# Patient Record
Sex: Female | Born: 1955 | Race: Black or African American | Hispanic: No | Marital: Single | Smoking: Former smoker
Health system: Southern US, Community
[De-identification: ages and names within clinical notes are randomized; demographics above are authoritative.]

## PROBLEM LIST (undated history)

## (undated) DIAGNOSIS — I10 Essential (primary) hypertension: Secondary | ICD-10-CM

## (undated) DIAGNOSIS — C801 Malignant (primary) neoplasm, unspecified: Secondary | ICD-10-CM

## (undated) DIAGNOSIS — J189 Pneumonia, unspecified organism: Secondary | ICD-10-CM

## (undated) DIAGNOSIS — M359 Systemic involvement of connective tissue, unspecified: Secondary | ICD-10-CM

## (undated) DIAGNOSIS — M069 Rheumatoid arthritis, unspecified: Secondary | ICD-10-CM

## (undated) DIAGNOSIS — E119 Type 2 diabetes mellitus without complications: Secondary | ICD-10-CM

## (undated) DIAGNOSIS — E785 Hyperlipidemia, unspecified: Secondary | ICD-10-CM

## (undated) DIAGNOSIS — R0600 Dyspnea, unspecified: Secondary | ICD-10-CM

## (undated) DIAGNOSIS — M869 Osteomyelitis, unspecified: Secondary | ICD-10-CM

## (undated) HISTORY — PX: TOE SURGERY: SHX1073

## (undated) HISTORY — DX: Rheumatoid arthritis, unspecified: M06.9

---

## 2014-02-08 ENCOUNTER — Inpatient Hospital Stay: Payer: Self-pay | Admitting: Specialist

## 2014-02-08 LAB — COMPREHENSIVE METABOLIC PANEL
ALBUMIN: 2.9 g/dL — AB (ref 3.4–5.0)
ALK PHOS: 113 U/L
ALT: 12 U/L (ref 12–78)
ANION GAP: 7 (ref 7–16)
BUN: 12 mg/dL (ref 7–18)
Bilirubin,Total: 0.3 mg/dL (ref 0.2–1.0)
Calcium, Total: 8.9 mg/dL (ref 8.5–10.1)
Chloride: 101 mmol/L (ref 98–107)
Co2: 26 mmol/L (ref 21–32)
Creatinine: 0.69 mg/dL (ref 0.60–1.30)
EGFR (Non-African Amer.): >60
GLUCOSE: 456 mg/dL — AB (ref 65–99)
Osmolality: 288 (ref 275–301)
Potassium: 4.2 mmol/L (ref 3.5–5.1)
SGOT(AST): 16 U/L (ref 15–37)
Sodium: 134 mmol/L — ABNORMAL LOW (ref 136–145)
TOTAL PROTEIN: 7.6 g/dL (ref 6.4–8.2)

## 2014-02-08 LAB — CBC
HCT: 40.4 % (ref 35.0–47.0)
HGB: 13.3 g/dL (ref 12.0–16.0)
MCH: 30 pg (ref 26.0–34.0)
MCHC: 32.9 g/dL (ref 32.0–36.0)
MCV: 91 fL (ref 80–100)
Platelet: 380 10*3/uL (ref 150–440)
RBC: 4.43 10*6/uL (ref 3.80–5.20)
RDW: 13.3 % (ref 11.5–14.5)
WBC: 12.2 10*3/uL — AB (ref 3.6–11.0)

## 2014-02-08 LAB — HEMOGLOBIN A1C: HEMOGLOBIN A1C: 14.7 % — AB (ref 4.2–6.3)

## 2014-02-09 LAB — CBC WITH DIFFERENTIAL/PLATELET
BASOS ABS: 0.1 10*3/uL (ref 0.0–0.1)
BASOS PCT: 0.7 %
Eosinophil #: 0.2 10*3/uL (ref 0.0–0.7)
Eosinophil %: 1.1 %
HCT: 35.3 % (ref 35.0–47.0)
HGB: 11.7 g/dL — AB (ref 12.0–16.0)
Lymphocyte #: 3.4 10*3/uL (ref 1.0–3.6)
Lymphocyte %: 22 %
MCH: 29.9 pg (ref 26.0–34.0)
MCHC: 33 g/dL (ref 32.0–36.0)
MCV: 91 fL (ref 80–100)
MONO ABS: 1.5 x10 3/mm — AB (ref 0.2–0.9)
MONOS PCT: 9.7 %
NEUTROS ABS: 10.3 10*3/uL — AB (ref 1.4–6.5)
NEUTROS PCT: 66.5 %
Platelet: 354 10*3/uL (ref 150–440)
RBC: 3.89 10*6/uL (ref 3.80–5.20)
RDW: 13.4 % (ref 11.5–14.5)
WBC: 15.4 10*3/uL — ABNORMAL HIGH (ref 3.6–11.0)

## 2014-02-09 LAB — BASIC METABOLIC PANEL
Anion Gap: 5 — ABNORMAL LOW (ref 7–16)
BUN: 11 mg/dL (ref 7–18)
CHLORIDE: 102 mmol/L (ref 98–107)
Calcium, Total: 8.4 mg/dL — ABNORMAL LOW (ref 8.5–10.1)
Co2: 26 mmol/L (ref 21–32)
Creatinine: 0.78 mg/dL (ref 0.60–1.30)
EGFR (African American): >60
Glucose: 220 mg/dL — ABNORMAL HIGH (ref 65–99)
OSMOLALITY: 273 (ref 275–301)
POTASSIUM: 4.2 mmol/L (ref 3.5–5.1)
SODIUM: 133 mmol/L — AB (ref 136–145)

## 2014-02-10 LAB — VANCOMYCIN, TROUGH: VANCOMYCIN, TROUGH: 8 ug/mL — AB (ref 10–20)

## 2014-02-11 LAB — CBC WITH DIFFERENTIAL/PLATELET
BASOS PCT: 0.8 %
Basophil #: 0.1 10*3/uL (ref 0.0–0.1)
EOS ABS: 0.3 10*3/uL (ref 0.0–0.7)
EOS PCT: 2.3 %
HCT: 35.2 % (ref 35.0–47.0)
HGB: 11.4 g/dL — ABNORMAL LOW (ref 12.0–16.0)
Lymphocyte #: 3.8 10*3/uL — ABNORMAL HIGH (ref 1.0–3.6)
Lymphocyte %: 33.3 %
MCH: 29.2 pg (ref 26.0–34.0)
MCHC: 32.4 g/dL (ref 32.0–36.0)
MCV: 90 fL (ref 80–100)
MONO ABS: 1.1 x10 3/mm — AB (ref 0.2–0.9)
Monocyte %: 9.6 %
Neutrophil #: 6.1 10*3/uL (ref 1.4–6.5)
Neutrophil %: 54 %
PLATELETS: 409 10*3/uL (ref 150–440)
RBC: 3.91 10*6/uL (ref 3.80–5.20)
RDW: 13.3 % (ref 11.5–14.5)
WBC: 11.3 10*3/uL — ABNORMAL HIGH (ref 3.6–11.0)

## 2014-02-12 LAB — CBC WITH DIFFERENTIAL/PLATELET
BASOS ABS: 0.1 10*3/uL (ref 0.0–0.1)
Basophil %: 0.6 %
EOS PCT: 2.3 %
Eosinophil #: 0.2 10*3/uL (ref 0.0–0.7)
HCT: 33.7 % — ABNORMAL LOW (ref 35.0–47.0)
HGB: 11.2 g/dL — ABNORMAL LOW (ref 12.0–16.0)
LYMPHS ABS: 2.9 10*3/uL (ref 1.0–3.6)
LYMPHS PCT: 28.6 %
MCH: 30.2 pg (ref 26.0–34.0)
MCHC: 33.3 g/dL (ref 32.0–36.0)
MCV: 91 fL (ref 80–100)
MONOS PCT: 11.9 %
Monocyte #: 1.2 x10 3/mm — ABNORMAL HIGH (ref 0.2–0.9)
NEUTROS PCT: 56.6 %
Neutrophil #: 5.7 10*3/uL (ref 1.4–6.5)
PLATELETS: 404 10*3/uL (ref 150–440)
RBC: 3.71 10*6/uL — ABNORMAL LOW (ref 3.80–5.20)
RDW: 13.1 % (ref 11.5–14.5)
WBC: 10.1 10*3/uL (ref 3.6–11.0)

## 2014-02-13 LAB — WOUND CULTURE

## 2014-02-13 LAB — CULTURE, BLOOD (SINGLE)

## 2014-02-13 LAB — SEDIMENTATION RATE: Erythrocyte Sed Rate: 73 mm/hr — ABNORMAL HIGH (ref 0–30)

## 2014-02-14 LAB — WOUND CULTURE

## 2014-04-18 DIAGNOSIS — R809 Proteinuria, unspecified: Secondary | ICD-10-CM | POA: Insufficient documentation

## 2014-05-18 DIAGNOSIS — J309 Allergic rhinitis, unspecified: Secondary | ICD-10-CM | POA: Insufficient documentation

## 2014-12-15 NOTE — Consult Note (Signed)
PATIENT NAME:  Tamara Parrish, Tamara Parrish MR#:  [id number] DATE OF BIRTH:  12/10/1955  DATE OF CONSULTATION:  02/09/2014  REFERRING PHYSICIAN:   CONSULTING PHYSICIAN:  Gerrit Heck. Troxler, DPM  REASON FOR CONSULTATION: Infected left great toe.   HISTORY OF PRESENT ILLNESS: The patient is a 59 year old African American female who has neglected her diabetes for probably the last 10 or 11 years. She came in with a hemoglobin A1c of almost 15. She presented with swollen, draining, red, painful left great toe that she states has been going on since last week, but may have been going on for longer period of time. X-ray showed severe soft tissue swelling with possible osteomyelitis, but uncertain at this juncture.   PAST MEDICAL HISTORY: Includes uncontrolled diabetes and hypertension.  PAST SURGICAL HISTORY: Includes tubal ligation.   ALLERGIES: According to the patient, she is allergic to IBUPROFEN AND PENICILLIN, but she says she takes Tylenol and ibuprofen as needed.   SOCIAL HISTORY: Includes positive smoking. She states she stopped 2 months ago. Denies EtOH. She is a Education officer, environmental.   FAMILY HISTORY: Includes mother and sister with diabetes.   PHYSICAL EXAMINATION:  VITAL SIGNS: At this juncture include: Temperature is 98.9, pulse 81, respirations 18, blood pressure 127/67, and pulse ox is 98%.  LOWER EXTREMITY: Vasculature: DP pulses are +2/4 on this foot. There is some swelling and edema, but it is fairly minimal, but pulses are distinctly palpable. Dermatologic: The patient has an abscess, which is ruptured, on the dorsum of the left foot. Noted cellulitis on the left great toe as well. It is very painful for her with attempts at manipulation.   CLINICAL IMPRESSION: Uncontrolled diabetes with cellulitis and abscess to the left great toe, which x-rays indicate significant soft tissue involvement and possible bone involvement.   TREATMENT PLAN: I took a culture of it today. We will send that to the lab.  I have got her scheduled for incision and drainage tomorrow morning in the OR. She is going to need to be put to sleep because it is very painful for her. I explained to her today that we will have to have her sign a consent form for removal of infected soft tissue including possible tendon and possible removal of infected bone to the great toe as well. I do not plan on amputating the toe at this digit but ultimately she may need this based on the infection and degree of damage that she has to the toe. I will have her sign a consent form and have her be n.p.o. after midnight tonight. I explained everything to her in detail. Hopefully she understands that well. She indicated that she understood everything that I had to say and have her scheduled for tomorrow morning for this incision and drainage.    ____________________________ Gerrit Heck Troxler, DPM mgt:sb D: 02/09/2014 15:20:43 ET T: 02/09/2014 17:08:08 ET JOB#: [id number]  cc: Gerrit Heck Troxler, DPM, <Dictator> Perry Mount MD ELECTRONICALLY SIGNED 03/02/2014 8:32

## 2014-12-15 NOTE — Consult Note (Signed)
PATIENT NAME:  Tamara Parrish, Tamara Parrish MR#:  [id number] DATE OF BIRTH:  05-16-1956  DATE OF CONSULTATION:  02/12/2014  REFERRING PHYSICIAN:  Abel Presto, MD  CONSULTING PHYSICIAN:  Cheral Marker. Ola Spurr, MD  REASON FOR CONSULTATION: Diabetic foot osteomyelitis.   HISTORY OF PRESENT ILLNESS: This is a pleasant 59 year old female, former smoker, as well as poorly controlled diabetes who has been without a physician for many years. She was admitted June 18th with left foot great toe infection that had been worsening over several weeks. When she was admitted her sugars were unreadable and her A1c was greater than 15. She has not been on any medications for her diabetes in many years. She just quit smoking about a month ago. After admission the patient had an x-ray which showed soft tissue swelling. She was seen by Dr. Elvina Mattes and on June 20th underwent incision and debridement of the left great toe with findings of deep soft tissue infection and osteomyelitis of the left great toe. No amputation was done. Cultures were sent and are growing enterococcus. She has been on vancomycin and Zosyn since that time. She has been tolerating it well.   PAST MEDICAL HISTORY: 1.  Diabetes, very poorly controlled.  2.  Hypertension.  3.  Tobacco abuse, just quit 1 month ago.   PAST SURGICAL HISTORY: Tubal ligation.   ALLERGIES: The patient says she is allergic to IBUPROFEN AND PENICILLIN. She says she had some hives with the penicillin. She has been tolerating Zosyn, however.  HOME MEDICATIONS: Include Tylenol and ibuprofen.   MEDICATIONS SINCE ADMISSION: Vancomycin and Zosyn.   SOCIAL HISTORY: The patient works with disabled adults. She quit smoking after many years, 1 to 2 months ago. She does not drink alcohol. She is living with her daughter.   FAMILY HISTORY: Mother and sister with diabetes.   REVIEW OF SYSTEMS: Eleven systems reviewed and negative, except per HPI.   PHYSICAL EXAMINATION: VITAL SIGNS:  Temperature 98.2, pulse 65, blood pressure 145/77, respirations 18, sat 98% on room air.  GENERAL: She is pleasant, interactive, in no acute distress.  HEENT: Pupils equal, round and reactive to light and accommodation. Extraocular movements are intact. Sclerae anicteric. Oropharynx is clear.  NECK: Supple.  HEART: Regular.  LUNGS: Clear to auscultation bilaterally.  ABDOMEN: Soft, nontender, nondistended.  EXTREMITIES: Her left foot is unwrapped. She has an incision over her left great toe. This is packed. There is swelling and erythema surrounding it. NEUROLOGIC: She is alert and oriented x3. Grossly nonfocal neuro exam.  DIAGNOSTIC DATA: Her white blood count on admission was 12.2. It peaked at 15.4 and is now 10.1, hemoglobin 11.2, platelets 404,000. Hemoglobin A1c is 14.7. Renal function is normal. Creatinine 0.78. LFTs are normal, except for slightly low albumin at 2.9.   Blood cultures x2 are negative. Cultures of the left foot are growing light growth of Enterococcus faecalis. This was sensitive to ampicillin and linezolid. Culture from June 20th is also being held for possible pathogen.   Imaging of the toe shows severe soft tissue swelling and subcutaneous air around the toe and erosive changes of the distal phalanx of the great toe.   Chest x-ray shows no acute cardiopulmonary disease.   IMPRESSION: A 59 year old with very poorly controlled diabetes admitted with several weeks of ulceration on her left great toe followed by abscess formation and likely osteomyelitis. She has undergone debridement but not amputation of the toe. Her A1c is 14.7. 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   I am concerned that she will need high doses of antibiotics to cure this and prevent further need for amputation. This is for limb salvage. I discussed with her she will need much better diabetes control and  advised her to continue cessation of smoking.   RECOMMENDATIONS: 1.  Place PICC line.  2.  Zosyn 3.375 q. 8 hours for 4 weeks at least. I can see her in follow up regarding this. She is listed as being allergic to penicillin, but has been tolerating the Zosyn fine. We will need to closely monitor. She will need weekly CBC with diff and comprehensive metabolic panel as well as CRP.  3.  Check CRP and ESR for baseline.   Thank you for the consult. I will be glad to follow with you.   ____________________________ Cheral Marker. Ola Spurr, MD dpf:sb D: 02/12/2014 15:47:34 ET T: 02/12/2014 16:25:22 ET JOB#: [id number]  cc: Cheral Marker. Ola Spurr, MD, <Dictator> DAVID Ola Spurr MD ELECTRONICALLY SIGNED 02/15/2014 13:28

## 2014-12-15 NOTE — Op Note (Signed)
PATIENT NAME:  Tamara Parrish, Tamara Parrish MR#:  [id number] DATE OF BIRTH:  Sep 24, 1955  DATE OF PROCEDURE:  02/10/2014  PREOPERATIVE DIAGNOSIS: Diabetic foot infection, left great toe, with deep soft tissue infection and possible osteomyelitis.   POSTOPERATIVE DIAGNOSIS: Deep tissue infection with osteomyelitis, left great toe.   PROCEDURE: Excision of infected soft tissue and bone, left great toe.   SURGEON: Gerrit Heck. Troxler, DPM  ASSISTANT: None.   HISTORY OF PRESENT ILLNESS: The patient was admitted to the hospital a couple days ago with redness, cellulitis, open draining wound on the left great toe which was very pronouncedly infected. She is diabetic. Incision and drainage was necessary. On x-ray, there was some indication that there may be some change in the distal phalanx, but that was uncertain based on the radiology report.   ANESTHESIA: General.   ANESTHESIOLOGIST: Alvina Filbert. Andree Elk, MD   ESTIMATED BLOOD LOSS: Negligible.   HEMOSTASIS: A Penrose drain tourniquet at the base of the toe for 5 minutes.   DESCRIPTION OF PROCEDURE: The patient is brought to the OR and placed on the OR table in the supine position. After general anesthesia was achieved, the patient was prepped and draped in the usual sterile manner. At this time, attention was directed to the dorsum of the left great toe, where a wound approximately 2 cm in diameter was noted. Probing of the area showed that it penetrated down to at least the extensor tendon area, possibly down into the bone region. A combination of rongeurs as well as VersaJet was utilized to remove infected damaged soft tissue. It was noted at this point that the extensor tendon was eroded and infected at the distal phalanx insertion site. Bone had demineralized and was in the wound site at this point. Some of this bone was removed for culture. At this point, a combination of curette and rongeur was used to remove all abnormal-appearing portions of the distal  phalanx. The patient did not consent for an amputation, so I felt like we needed to just clean out the bone as best we could, remove all infected soft tissues, which was accomplished. The area was then copiously irrigated, and skin was then closed with 4-0 and 3-0 nylon simple interrupted sutures. The extensor tendon was purchased with several of the sutures in order to stabilize it so that it would tenodese down to the remainder of the bone. The skin was closed distally as well. The wound had been opened up proximally and distally by 0.5 cm on each side. These were all closed with 4-0 nylon. The wound was then packed open with iodoform gauze packing. A sterile dressing was then placed across the wound. The tourniquet was released. Prompt and complete vascularity was seen to return to the digit. A sterile dressing was placed across the wound consisting of 4 x 4's, Conform and Kerlix. The patient appeared to tolerate the procedure and anesthesia well and left the OR for the recovery room with vital signs stable and neurovascular status intact.   ____________________________ Gerrit Heck. Troxler, DPM mgt:lb D: 02/10/2014 08:47:34 ET T: 02/10/2014 10:11:52 ET JOB#: [id number]  cc: Gerrit Heck Troxler, DPM, <Dictator> Perry Mount MD ELECTRONICALLY SIGNED 03/02/2014 8:32

## 2014-12-15 NOTE — H&P (Signed)
PATIENT NAME:  Tamara Parrish, Tamara Parrish MR#:  [id number] DATE OF BIRTH:  February 26, 1956  DATE OF ADMISSION:  02/08/2014  PRIMARY CARE PHYSICIAN:  None.  Has seen Dr. Ronnald Collum a long time ago, 10 to 15 years ago.   REQUESTING PHYSICIAN: Dr. Lavonia Drafts.  CHIEF COMPLAINT: Left great toe pain.   HISTORY OF PRESENT ILLNESS: The patient is a 59 year old female with a known history of diabetes who is being admitted for left great toe diabetic infection. The patient went to urgent care this morning for painful left great toe, which has been there for the last few weeks; it has been getting worse. She was also told her blood sugar has been unreadable at urgent care and she was requested to come to the Emergency Department. The patient at some point was on metformin and Lantus. She took it for about 4 years, has not been taking this for at least the last 10, 11 years due to lack of insurance. She has been taking IBUPROFEN and Tylenol on and off for her toe pain. Ibuprofen is bothering her stomach and now she is here for further evaluation and management.  While in the ED, she underwent x-ray which showed severe soft tissue swelling and subcutaneous air, consistent with soft tissue infection, possible osteomyelitis cannot be ruled out. She is being admitted for further evaluation and management.  PAST MEDICAL HISTORY:  Diabetes, hypertension.   PAST SURGICAL HISTORY:  Tubal ligation.   ALLERGIES: IBUPROFEN AND PENICILLIN.   MEDICATIONS AT HOME: 1.  Tylenol as needed. 2.  IBUPROFEN as needed.   SOCIAL HISTORY: Former smoker 80 pack-years, stopped 2 months ago. No alcohol.  She is a Education officer, environmental.   FAMILY HISTORY: Mother and sister with diabetes.   REVIEW OF SYSTEMS: CONSTITUTIONAL: No fever, fatigue, weakness.   EYES:  No blurred or double vision.  ENT: No tinnitus, ear pain.  RESPIRATORY: No cough, wheezing, hemoptysis.  CARDIOVASCULAR: No chest pain, orthopnea, edema.  GASTROINTESTINAL: No nausea,  vomiting, diarrhea.  GENITOURINARY: No dysuria or hematuria.  ENDOCRINE: Polyuria, nocturia.  HEMATOLOGIC:  No anemia, easy bruising. SKIN: She has a left great toe abscess with possible underlying osteomyelitis, also very painful. She is not able to move due to significant pain in the area.  MUSCULOSKELETAL: Left great toe pain. No arthritis. No swelling.  NEUROLOGIC: No tingling, numbness, weakness.  PSYCHIATRIC: No history of anxiety or depression.   PHYSICAL EXAMINATION: VITAL SIGNS: Temperature 98.3, heart rate 103 per minute, respirations 18 per minute, blood pressure 210/114 mmHg.  She is saturating 98% on room air.  GENERAL: The patient is a 59 year old female lying in the bed in no acute distress.  EYES: Pupils equal, round, reactive to light and accommodation. No scleral icterus. Extraocular muscles intact.  HENT: Head atraumatic, normocephalic. Oropharynx and nasopharynx clear.  NECK: Supple. No jugular venous distention. Show no thyroid enlargement or tenderness.  LUNGS: Clear to auscultation bilaterally. No wheezing, rales, rhonchi, crepitation.  CARDIOVASCULAR: S1, S2 normal. No murmur, rubs, gallop.  ABDOMEN: Soft, nontender, nondistended. Bowel sounds present. No organomegaly or mass.  EXTREMITIES: No cyanosis or clubbing. She does have left great toe abscess with significant tenderness with any range of motion.  She has significant pus collection right above her great toe. NEUROLOGIC: Cranial nerves II through XII intact. Muscle strength 5 out of 5 in extremities.   Sensation intact.  PSYCHIATRIC:  The patient is alert and oriented x 3. SKIN:  Finding as above in her left great toe.  MUSCULOSKELETAL: No joint effusion or tenderness other than left great toe area.   Chest x-ray in the ED showed no acute cardiopulmonary disease.  Left foot/great toe x-ray showed severe soft tissue swelling and subcutaneous air, consistent with soft tissue infection. Erosive change at the base  of the distal phalanx of the left great toe suggestive of osteomyelitis with possible associated septic arthritis.   IMPRESSION AND PLAN: 1.  Left great toe osteomyelitis and/or septic arthritis.  We will start on IV vancomycin and Zosyn. Consult podiatry. Will likely need surgery. Obtain wound culture and blood culture. 2.  Uncontrolled diabetes.  We will check hemoglobin A1c.  She has not been taking any medication due to lack of insurance.  We will get a diabetic educator consult and consult care management will help with insurance and medication help.  Start on sliding-scale insulin, metformin and also Levemir. 3.  Uncontrolled hypertension. We will start her on metoprolol at this time and adjust medication as needed.  We will also start on ACE inhibitor considering her diabetes.  CODE STATUS:  FULL CODE.  TOTAL TIME TAKING CARE OF THIS PATIENT:  55 minutes.   ____________________________ Lucina Mellow. Manuella Ghazi, MD vss:ce D: 02/08/2014 15:37:21 ET T: 02/08/2014 16:34:08 ET JOB#: [id number]  cc: Jamilette Suchocki S. Manuella Ghazi, MD, <Dictator> Unknown Remer Macho MD ELECTRONICALLY SIGNED 02/09/2014 8:57

## 2014-12-15 NOTE — Discharge Summary (Signed)
PATIENT NAME:  Tamara Parrish, Tamara Parrish MR#:  [id number] DATE OF BIRTH:  Jan 19, 1956  DATE OF ADMISSION:  02/08/2014 DATE OF DISCHARGE:  02/13/2014  Patient name, Tamara Parrish service MR # colon [id number].   DATE OF BIRTH: 02/01/1962.   For a detailed note, please take a look at the history and physical done on admission by Dr. Manuella Ghazi.   DIAGNOSES AT DISCHARGE: As follows:  1. Acute left foot/great toe osteomyelitis status post debridement.  2. Hypertension.  3. Uncontrolled diabetes due to medical noncompliance.   DISCHARGE DIET:  The patient is being discharged on a low-sodium, low-fat American Diabetic Association diet.   ACTIVITY: As tolerated.   FOLLOWUP:  The followup is with Dr. Ola Spurr in the next 1-2 weeks. Also follow up with Dr. Elvina Mattes in 1-2 weeks. Also follow up with Dr. Johny Drilling at [facility] or someone in the group in the next 1-2 weeks.   DISCHARGE MEDICATIONS:  Tylenol with oxycodone 5/325 one tablet q. 4 hours as needed; lisinopril 5 mg daily; metformin 1000 mg b.i.d.; metoprolol tartrate 25 mg b.i.d.; Zosyn 3.375 IV q. 8 hours x 4 weeks, last dose being 03/08/2014; Levemir FlexPen 10 units at bedtime.   The patient is being discharged on an iodoform pack dressing change to be done daily, along with a dry gauze.   PERTINENT STUDIES DONE DURING THE HOSPITAL COURSE: Are as follows: An x-ray of the left great toe done on admission showing severe soft tissue swelling and subcutaneous air noted about the left great toe consistent with soft tissue infection. Erosive changes at the base of the distal phalanx on the left great toe.  This suggests osteomyelitis with possible associated septic arthritis. Chest x-ray done on admission showing no acute cardiopulmonary disease. The patient's wound cultures to be positive for Enterococcus faecalis.   [facility] COURSE: Dr. Adrian Prows, infectious disease; Dr. Albertine Patricia, podiatry.   HOSPITAL COURSE: This  is a 59 year old female who presented to the hospital with a swollen left great toe, which was tender and red.  1. Left great toe osteomyelitis. This was likely the cause of patient's left great toe swelling and redness that she presented with. The patient apparently has diabetes and was noncompliant and was not taking any medications. The patient was seen by podiatry who recommended surgical intervention. Therefore, she was taken to the OR, had surgical debridement of the wound and also removal of the infected bone. Wound cultures are presently growing Enterococcus faecalis. Empirically, patient was on vancomycin, Zosyn. Now currently is being discharged on just IV Zosyn given the fact that patient likely has osteomyelitis. The patient was also seen by infectious disease who agreed with long-term IV antibiotic therapy. Therefore, she had a PICC line placed and is currently being discharged on IV antibiotics. She is currently afebrile and hemodynamically stable. Her white cell count is normal. She will follow up with Dr. Ola Spurr as an outpatient and also she is going to be arranged for a new primary care physician with [facility] as mentioned.  2. Uncontrolled diabetes. This is secondary to medical noncompliance. The patient's hemoglobin A1c is high as 14. She was not taking any medications prior to coming in. The patient was seen by diabetic lifestyle and was given education about the disease. The patient was started on sliding scale insulin, along with metformin and Levemir. At present, I am discharging her on the high-dose metformin along with Levemir. Further titrations to her antidiabetic medications can be done  through her primary care physician's office once she gets established.  3. Hypertension. The patient was started on some low-dose metoprolol and ACE inhibitor, and her hemodynamics have since then been stable.   CODE STATUS: The patient is a full code.   DISPOSITION: She is being  discharged with home health nursing services given her chronic wound care and long-term IV antibiotics.   TIME SPENT ON DISCHARGE:  45 minutes.    ____________________________ Belia Heman. Verdell Carmine, MD vjs:dd D: 02/13/2014 16:03:36 ET T: 02/13/2014 20:04:32 ET JOB#: [id number]  cc: Belia Heman. Verdell Carmine, MD, <Dictator> Cheral Marker. Ola Spurr, MD Gerrit Heck Troxler, DPM [place]era [place], MD Henreitta Leber MD ELECTRONICALLY SIGNED 02/20/2014 20:35

## 2014-12-15 NOTE — Consult Note (Signed)
Brief Consult Note: Diagnosis: cellulitis with abcess left great toe.   Patient was seen by consultant.   Recommend to proceed with surgery or procedure.   Orders entered.   Comments: Will need extensive debridement tomorrow.  May have toe.  Electronic Signatures: Perry Mount (MD)  (Signed 19-Jun-15 15:22)  Authored: Brief Consult Note   Last Updated: 19-Jun-15 15:22 by Perry Mount (MD)

## 2015-05-03 DIAGNOSIS — E119 Type 2 diabetes mellitus without complications: Secondary | ICD-10-CM | POA: Insufficient documentation

## 2016-03-18 DIAGNOSIS — M2550 Pain in unspecified joint: Secondary | ICD-10-CM | POA: Insufficient documentation

## 2016-03-27 DIAGNOSIS — M059 Rheumatoid arthritis with rheumatoid factor, unspecified: Secondary | ICD-10-CM | POA: Insufficient documentation

## 2016-10-09 DIAGNOSIS — E7800 Pure hypercholesterolemia, unspecified: Secondary | ICD-10-CM | POA: Insufficient documentation

## 2016-10-09 DIAGNOSIS — I10 Essential (primary) hypertension: Secondary | ICD-10-CM | POA: Insufficient documentation

## 2017-05-17 DIAGNOSIS — J301 Allergic rhinitis due to pollen: Secondary | ICD-10-CM | POA: Insufficient documentation

## 2017-05-17 DIAGNOSIS — G63 Polyneuropathy in diseases classified elsewhere: Secondary | ICD-10-CM | POA: Insufficient documentation

## 2017-08-19 DIAGNOSIS — R058 Other specified cough: Secondary | ICD-10-CM | POA: Insufficient documentation

## 2017-08-19 DIAGNOSIS — R05 Cough: Secondary | ICD-10-CM | POA: Insufficient documentation

## 2017-08-19 DIAGNOSIS — M0639 Rheumatoid nodule, multiple sites: Secondary | ICD-10-CM | POA: Insufficient documentation

## 2017-12-16 DIAGNOSIS — R700 Elevated erythrocyte sedimentation rate: Secondary | ICD-10-CM | POA: Insufficient documentation

## 2018-02-07 ENCOUNTER — Emergency Department: Payer: BLUE CROSS/BLUE SHIELD

## 2018-02-07 ENCOUNTER — Encounter: Payer: Self-pay | Admitting: Internal Medicine

## 2018-02-07 ENCOUNTER — Other Ambulatory Visit: Payer: Self-pay

## 2018-02-07 ENCOUNTER — Inpatient Hospital Stay
Admission: EM | Admit: 2018-02-07 | Discharge: 2018-02-11 | DRG: 871 | Disposition: A | Discharge status: Home Health | Payer: BLUE CROSS/BLUE SHIELD | Attending: Internal Medicine | Admitting: Internal Medicine

## 2018-02-07 DIAGNOSIS — I10 Essential (primary) hypertension: Secondary | ICD-10-CM | POA: Diagnosis present

## 2018-02-07 DIAGNOSIS — M069 Rheumatoid arthritis, unspecified: Secondary | ICD-10-CM | POA: Diagnosis present

## 2018-02-07 DIAGNOSIS — A419 Sepsis, unspecified organism: Principal | ICD-10-CM | POA: Diagnosis present

## 2018-02-07 DIAGNOSIS — E119 Type 2 diabetes mellitus without complications: Secondary | ICD-10-CM | POA: Diagnosis present

## 2018-02-07 DIAGNOSIS — Z7984 Long term (current) use of oral hypoglycemic drugs: Secondary | ICD-10-CM | POA: Diagnosis not present

## 2018-02-07 DIAGNOSIS — Z87891 Personal history of nicotine dependence: Secondary | ICD-10-CM | POA: Diagnosis not present

## 2018-02-07 DIAGNOSIS — Z880 Allergy status to penicillin: Secondary | ICD-10-CM

## 2018-02-07 DIAGNOSIS — E785 Hyperlipidemia, unspecified: Secondary | ICD-10-CM | POA: Diagnosis present

## 2018-02-07 DIAGNOSIS — Z89412 Acquired absence of left great toe: Secondary | ICD-10-CM | POA: Diagnosis not present

## 2018-02-07 DIAGNOSIS — Z886 Allergy status to analgesic agent status: Secondary | ICD-10-CM

## 2018-02-07 DIAGNOSIS — J189 Pneumonia, unspecified organism: Secondary | ICD-10-CM | POA: Diagnosis present

## 2018-02-07 DIAGNOSIS — Z79899 Other long term (current) drug therapy: Secondary | ICD-10-CM | POA: Diagnosis not present

## 2018-02-07 DIAGNOSIS — J9601 Acute respiratory failure with hypoxia: Secondary | ICD-10-CM | POA: Diagnosis present

## 2018-02-07 HISTORY — DX: Essential (primary) hypertension: I10

## 2018-02-07 HISTORY — DX: Rheumatoid arthritis, unspecified: M06.9

## 2018-02-07 HISTORY — DX: Type 2 diabetes mellitus without complications: E11.9

## 2018-02-07 HISTORY — DX: Hyperlipidemia, unspecified: E78.5

## 2018-02-07 HISTORY — DX: Osteomyelitis, unspecified: M86.9

## 2018-02-07 LAB — URINALYSIS, COMPLETE (UACMP) WITH MICROSCOPIC
BILIRUBIN URINE: NEGATIVE
Glucose, UA: NEGATIVE mg/dL
KETONES UR: NEGATIVE mg/dL
LEUKOCYTES UA: NEGATIVE
Nitrite: NEGATIVE
PH: 5 (ref 5.0–8.0)
Protein, ur: 30 mg/dL — AB
Specific Gravity, Urine: 1.015 (ref 1.005–1.030)

## 2018-02-07 LAB — COMPREHENSIVE METABOLIC PANEL
ALK PHOS: 104 U/L (ref 38–126)
ALT: 11 U/L — ABNORMAL LOW (ref 14–54)
ANION GAP: 11 (ref 5–15)
AST: 25 U/L (ref 15–41)
Albumin: 2.3 g/dL — ABNORMAL LOW (ref 3.5–5.0)
BUN: 29 mg/dL — ABNORMAL HIGH (ref 6–20)
CALCIUM: 8.6 mg/dL — AB (ref 8.9–10.3)
CO2: 20 mmol/L — AB (ref 22–32)
CREATININE: 1.09 mg/dL — AB (ref 0.44–1.00)
Chloride: 102 mmol/L (ref 101–111)
GFR, EST NON AFRICAN AMERICAN: 53 mL/min — AB (ref >60)
Glucose, Bld: 194 mg/dL — ABNORMAL HIGH (ref 65–99)
Potassium: 4.1 mmol/L (ref 3.5–5.1)
SODIUM: 133 mmol/L — AB (ref 135–145)
Total Bilirubin: 0.3 mg/dL (ref 0.3–1.2)
Total Protein: 7.7 g/dL (ref 6.5–8.1)

## 2018-02-07 LAB — LACTIC ACID, PLASMA
LACTIC ACID, VENOUS: 2 mmol/L — AB (ref 0.5–1.9)
LACTIC ACID, VENOUS: 1.7 mmol/L (ref 0.5–1.9)

## 2018-02-07 LAB — CBC
HCT: 32 % — ABNORMAL LOW (ref 35.0–47.0)
HEMOGLOBIN: 10.8 g/dL — AB (ref 12.0–16.0)
MCH: 30.4 pg (ref 26.0–34.0)
MCHC: 33.6 g/dL (ref 32.0–36.0)
MCV: 90.4 fL (ref 80.0–100.0)
Platelets: 683 10*3/uL — ABNORMAL HIGH (ref 150–440)
RBC: 3.54 MIL/uL — AB (ref 3.80–5.20)
RDW: 15.3 % — ABNORMAL HIGH (ref 11.5–14.5)
WBC: 15.6 10*3/uL — ABNORMAL HIGH (ref 3.6–11.0)

## 2018-02-07 LAB — LIPASE, BLOOD: LIPASE: 57 U/L — AB (ref 11–51)

## 2018-02-07 MED ORDER — METOPROLOL TARTRATE 25 MG PO TABS
25.0000 mg | ORAL_TABLET | Freq: Two times a day (BID) | ORAL | Status: DC
Start: 2018-02-07 — End: 2018-02-11
  Administered 2018-02-07 – 2018-02-11 (×7): 25 mg via ORAL
  Filled 2018-02-07 (×8): qty 1

## 2018-02-07 MED ORDER — FOLIC ACID 1 MG PO TABS
1.0000 mg | ORAL_TABLET | Freq: Every day | ORAL | Status: DC
  Administered 2018-02-08 – 2018-02-11 (×4): 1 mg via ORAL
  Filled 2018-02-07 (×4): qty 1

## 2018-02-07 MED ORDER — SODIUM CHLORIDE 0.9 % IV SOLN
INTRAVENOUS | Status: DC
  Administered 2018-02-07 – 2018-02-08 (×2): via INTRAVENOUS

## 2018-02-07 MED ORDER — ENOXAPARIN SODIUM 40 MG/0.4ML ~~LOC~~ SOLN
40.0000 mg | SUBCUTANEOUS | Status: DC
  Administered 2018-02-07 – 2018-02-09 (×3): 40 mg via SUBCUTANEOUS
  Filled 2018-02-07 (×3): qty 0.4

## 2018-02-07 MED ORDER — ATORVASTATIN CALCIUM 20 MG PO TABS
40.0000 mg | ORAL_TABLET | Freq: Every day | ORAL | Status: DC
  Administered 2018-02-08 – 2018-02-10 (×3): 40 mg via ORAL
  Filled 2018-02-07 (×3): qty 2

## 2018-02-07 MED ORDER — VANCOMYCIN HCL 10 G IV SOLR
1250.0000 mg | INTRAVENOUS | Status: DC
  Administered 2018-02-08: 1250 mg via INTRAVENOUS
  Filled 2018-02-07 (×2): qty 1250

## 2018-02-07 MED ORDER — LISINOPRIL 20 MG PO TABS
40.0000 mg | ORAL_TABLET | Freq: Every day | ORAL | Status: DC
  Administered 2018-02-09 – 2018-02-11 (×3): 40 mg via ORAL
  Filled 2018-02-07 (×4): qty 2

## 2018-02-07 MED ORDER — ONDANSETRON 4 MG PO TBDP
4.0000 mg | ORAL_TABLET | Freq: Once | ORAL | Status: AC | PRN
  Administered 2018-02-07: 4 mg via ORAL
  Filled 2018-02-07: qty 1

## 2018-02-07 MED ORDER — INSULIN GLARGINE 100 UNIT/ML ~~LOC~~ SOLN
36.0000 [IU] | Freq: Every day | SUBCUTANEOUS | Status: DC
  Administered 2018-02-07: 36 [IU] via SUBCUTANEOUS
  Filled 2018-02-07 (×2): qty 0.36

## 2018-02-07 MED ORDER — VANCOMYCIN HCL IN DEXTROSE 1-5 GM/200ML-% IV SOLN
1000.0000 mg | Freq: Once | INTRAVENOUS | Status: AC
  Administered 2018-02-07: 1000 mg via INTRAVENOUS
  Filled 2018-02-07: qty 200

## 2018-02-07 MED ORDER — IPRATROPIUM-ALBUTEROL 0.5-2.5 (3) MG/3ML IN SOLN: 3.0000 mL | Freq: Four times a day (QID) | RESPIRATORY_TRACT | Status: DC | PRN

## 2018-02-07 MED ORDER — ASPIRIN 81 MG PO CHEW
81.0000 mg | CHEWABLE_TABLET | Freq: Every day | ORAL | Status: DC
  Administered 2018-02-08 – 2018-02-11 (×4): 81 mg via ORAL
  Filled 2018-02-07 (×4): qty 1

## 2018-02-07 MED ORDER — METHOTREXATE 2.5 MG PO TABS: 10.0000 mg | ORAL_TABLET | ORAL | Status: DC

## 2018-02-07 MED ORDER — ONDANSETRON HCL 4 MG/2ML IJ SOLN: 4.0000 mg | Freq: Four times a day (QID) | INTRAMUSCULAR | Status: DC | PRN

## 2018-02-07 MED ORDER — LEVOFLOXACIN IN D5W 500 MG/100ML IV SOLN
500.0000 mg | INTRAVENOUS | Status: DC
  Administered 2018-02-08 – 2018-02-09 (×2): 500 mg via INTRAVENOUS
  Filled 2018-02-07 (×2): qty 100

## 2018-02-07 MED ORDER — ACETAMINOPHEN 325 MG PO TABS
650.0000 mg | ORAL_TABLET | Freq: Four times a day (QID) | ORAL | Status: DC | PRN
  Administered 2018-02-08: 650 mg via ORAL
  Filled 2018-02-07: qty 2

## 2018-02-07 MED ORDER — TRIAMTERENE-HCTZ 37.5-25 MG PO CAPS
1.0000 | ORAL_CAPSULE | Freq: Every day | ORAL | Status: DC
  Filled 2018-02-07: qty 1

## 2018-02-07 MED ORDER — LEVOFLOXACIN IN D5W 750 MG/150ML IV SOLN
750.0000 mg | Freq: Once | INTRAVENOUS | Status: AC
  Administered 2018-02-07: 750 mg via INTRAVENOUS
  Filled 2018-02-07: qty 150

## 2018-02-07 MED ORDER — AMLODIPINE BESYLATE 5 MG PO TABS
5.0000 mg | ORAL_TABLET | Freq: Every day | ORAL | Status: DC
  Administered 2018-02-09 – 2018-02-11 (×3): 5 mg via ORAL
  Filled 2018-02-07 (×5): qty 1

## 2018-02-07 MED ORDER — METFORMIN HCL 500 MG PO TABS
1000.0000 mg | ORAL_TABLET | Freq: Two times a day (BID) | ORAL | Status: DC
  Administered 2018-02-08: 1000 mg via ORAL
  Filled 2018-02-07: qty 2

## 2018-02-07 MED ORDER — INSULIN ASPART 100 UNIT/ML ~~LOC~~ SOLN
0.0000 [IU] | Freq: Three times a day (TID) | SUBCUTANEOUS | Status: DC
  Administered 2018-02-08 – 2018-02-09 (×2): 1 [IU] via SUBCUTANEOUS
  Administered 2018-02-10: 2 [IU] via SUBCUTANEOUS
  Administered 2018-02-10: 1 [IU] via SUBCUTANEOUS
  Administered 2018-02-11: 3 [IU] via SUBCUTANEOUS
  Administered 2018-02-11: 1 [IU] via SUBCUTANEOUS
  Filled 2018-02-07 (×6): qty 1

## 2018-02-07 MED ORDER — ONDANSETRON HCL 4 MG PO TABS: 4.0000 mg | ORAL_TABLET | Freq: Four times a day (QID) | ORAL | Status: DC | PRN

## 2018-02-07 MED ORDER — LEVOFLOXACIN IN D5W 750 MG/150ML IV SOLN: 750.0000 mg | INTRAVENOUS | Status: DC

## 2018-02-07 MED ORDER — SODIUM CHLORIDE 0.9 % IV BOLUS
1000.0000 mL | Freq: Once | INTRAVENOUS | Status: AC
  Administered 2018-02-07: 1000 mL via INTRAVENOUS

## 2018-02-07 MED ORDER — ACETAMINOPHEN 650 MG RE SUPP: 650.0000 mg | Freq: Four times a day (QID) | RECTAL | Status: DC | PRN

## 2018-02-07 NOTE — Progress Notes (Signed)
Pharmacy Antibiotic Note  Tamara Parrish is a 62 y.o. female admitted on 02/07/2018 with pneumonia.  Pharmacy has been consulted for vancomycin levaquin dosing.  Plan: Vancomycin 1250mg  IV every 24 hours.  Goal trough 15-20 mcg/mL. levaquin 750mg  iv q24h   Height: 5' (152.4 cm) Weight: 183 lb (83 kg) IBW/kg (Calculated) : 45.5  Temp (24hrs), Avg:99.1 F (37.3 C), Min:99.1 F (37.3 C), Max:99.1 F (37.3 C)  Recent Labs  Lab 02/07/18 1848  WBC 15.6*  CREATININE 1.09*  LATICACIDVEN 2.0*    Estimated Creatinine Clearance: 50.5 mL/min (A) (by C-G formula based on SCr of 1.09 mg/dL (H)).    Allergies  Allergen Reactions  . Ibuprofen     Other reaction(s): Other (See Comments) Is not supposed to take because of her kidneys.  . Penicillins     Other reaction(s): Unknown    Antimicrobials this admission: Anti-infectives (From admission, onward)   Start     Dose/Rate Route Frequency Ordered Stop   02/08/18 2000  levofloxacin (LEVAQUIN) IVPB 750 mg     750 mg 100 mL/hr over 90 Minutes Intravenous Every 24 hours 02/07/18 2004     02/08/18 0400  vancomycin (VANCOCIN) 1,250 mg in sodium chloride 0.9 % 250 mL IVPB     1,250 mg 166.7 mL/hr over 90 Minutes Intravenous Every 24 hours 02/07/18 2006     02/07/18 2000  levofloxacin (LEVAQUIN) IVPB 750 mg     750 mg 100 mL/hr over 90 Minutes Intravenous  Once 02/07/18 1954     02/07/18 2000  vancomycin (VANCOCIN) IVPB 1000 mg/200 mL premix     1,000 mg 200 mL/hr over 60 Minutes Intravenous  Once 02/07/18 1954         Microbiology results: No results found for this or any previous visit (from the past 240 hour(s)).   Thank you for allowing pharmacy to be a part of this patient's care.  Donna Christen Horald Birky 02/07/2018 8:06 PM

## 2018-02-07 NOTE — H&P (Signed)
[facility] [place] NAME: Tamara Parrish    MR#:  [id number]  DATE OF BIRTH:  09-22-1955  DATE OF ADMISSION:  02/07/2018  PRIMARY CARE PHYSICIAN: Glendon Axe, MD   REQUESTING/REFERRING PHYSICIAN: Harvest Dark, MD  CHIEF COMPLAINT:   Chief Complaint  Patient presents with  . Shortness of Breath  . Emesis  . Weakness    HISTORY OF PRESENT ILLNESS: Tamara Parrish  is a 62 y.o. female with a known history of diabetes type 2, hyperlipidemia, essential hypertension, previous history of osteomyelitis and rheumatoid arthritis who is presenting with complaint of shortness of breath since last week.  She states that the shortness of breath is progressively gotten worse.  She has not had any fevers.  She does have some dry cough.  She also has been feeling nauseous and has had some vomiting.  Denies any abdominal pain or chest pain. PAST MEDICAL HISTORY:   Past Medical History:  Diagnosis Date  . DM2 (diabetes mellitus, type 2) ([place])   . Hyperlipemia   . Hypertension   . Osteomyelitis ([place])   . RA (rheumatoid arthritis) ([place])     PAST SURGICAL HISTORY:  Past Surgical History:  Procedure Laterality Date  . 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 Smoking status: Former Research scientist (life sciences)  . Smokeless tobacco: Never Used  Substance Use Topics  . Alcohol use: Never    Frequency: Never    FAMILY HISTORY:  Family History  Problem Relation Age of Onset  . Diabetes Mother     DRUG ALLERGIES:  Allergies  Allergen Reactions  . Ibuprofen     Other reaction(s): Other (See Comments) Is not supposed to take because of her kidneys.  . Penicillins     Has patient had a PCN reaction causing immediate rash, facial/tongue/throat swelling, SOB or lightheadedness with hypotension: Unknown Has patient had a PCN reaction causing severe rash involving mucus membranes or skin necrosis: Unknown Has patient had a  PCN reaction that required hospitalization: Unknown Has patient had a PCN reaction occurring within the last 10 years: Unknown If all of the above answers are "NO", then may proceed with Cephalosporin use.    REVIEW OF SYSTEMS:   CONSTITUTIONAL: No fever, fatigue or weakness.  EYES: No blurred or double vision.  EARS, NOSE, AND THROAT: No tinnitus or ear pain.  RESPIRATORY: No cough, positive shortness of breath, no wheezing or hemoptysis.  CARDIOVASCULAR: No chest pain, orthopnea, edema.  GASTROINTESTINAL: Positive nausea, positive vomiting, positive diarrhea or no abdominal pain.  GENITOURINARY: No dysuria, hematuria.  ENDOCRINE: No polyuria, nocturia,  HEMATOLOGY: No anemia, easy bruising or bleeding SKIN: No rash or lesion. MUSCULOSKELETAL: No joint pain or arthritis.   NEUROLOGIC: No tingling, numbness, weakness.  PSYCHIATRY: No anxiety or depression.   MEDICATIONS AT HOME:  Prior to Admission medications   Medication Sig Start Date End Date Taking? Authorizing Provider  amLODipine (NORVASC) 5 MG tablet Take 5 mg by mouth daily. 01/20/18  Yes [provider]  aspirin 81 MG chewable tablet Chew 1 tablet by mouth daily.   Yes [provider]  atorvastatin (LIPITOR) 40 MG tablet Take 40 mg by mouth daily. 01/20/18  Yes [provider]  folic acid (FOLVITE) 1 MG tablet Take 1 mg by mouth daily. 01/20/18  Yes [provider]  Insulin Glargine (BASAGLAR KWIKPEN) 100 UNIT/ML SOPN Inject 36 Units into the skin at  bedtime. 01/04/18  Yes [provider]  lisinopril (PRINIVIL,ZESTRIL) 40 MG tablet Take 40 mg by mouth daily. 01/20/18  Yes [provider]  metFORMIN (GLUCOPHAGE) 1000 MG tablet Take 1,000 mg by mouth 2 (two) times daily. 01/20/18  Yes [provider]  methotrexate 2.5 MG tablet Take 10 mg by mouth once a week. 01/02/18  Yes [provider]  metoprolol tartrate (LOPRESSOR) 25 MG tablet Take 25 mg by mouth 2 (two)  times daily. 01/20/18  Yes [provider]  triamterene-hydrochlorothiazide (DYAZIDE) 37.5-25 MG capsule Take 1 capsule by mouth daily. 01/20/18  Yes [provider]      PHYSICAL EXAMINATION:   VITAL SIGNS: Blood pressure 132/81, pulse 100, temperature 99.1 F (37.3 C), temperature source Oral, resp. rate (!) 22, height 5' (1.524 m), weight 83 kg (183 lb), SpO2 90 %.  GENERAL:  62 y.o.-year-old patient lying in the bed with no acute distress.  EYES: Pupils equal, round, reactive to light and accommodation. No scleral icterus. Extraocular muscles intact.  HEENT: Head atraumatic, normocephalic. Oropharynx and nasopharynx clear.  NECK:  Supple, no jugular venous distention. No thyroid enlargement, no tenderness.  LUNGS: Rhonchus breath sounds bilaterally without any accessory muscle usage CARDIOVASCULAR: S1, S2 normal. No murmurs, rubs, or gallops.  ABDOMEN: Soft, nontender, nondistended. Bowel sounds present. No organomegaly or mass.  EXTREMITIES: No pedal edema, cyanosis, or clubbing.  NEUROLOGIC: Cranial nerves II through XII are intact. Muscle strength 5/5 in all extremities. Sensation intact. Gait not checked.  PSYCHIATRIC: The patient is alert and oriented x 3.  SKIN: No obvious rash, lesion, or ulcer.   LABORATORY PANEL:   CBC Recent Labs  Lab 02/07/18 1848  WBC 15.6*  HGB 10.8*  HCT 32.0*  PLT 683*  MCV 90.4  MCH 30.4  MCHC 33.6  RDW 15.3*   ------------------------------------------------------------------------------------------------------------------  Chemistries  Recent Labs  Lab 02/07/18 1848  NA 133*  K 4.1  CL 102  CO2 20*  GLUCOSE 194*  BUN 29*  CREATININE 1.09*  CALCIUM 8.6*  AST 25  ALT 11*  ALKPHOS 104  BILITOT 0.3   ------------------------------------------------------------------------------------------------------------------ estimated creatinine clearance is 50.5 mL/min (A) (by C-G formula based on SCr of 1.09 mg/dL  (H)). ------------------------------------------------------------------------------------------------------------------ No results for input(s): TSH, T4TOTAL, T3FREE, THYROIDAB in the last 72 hours.  Invalid input(s): FREET3   Coagulation profile No results for input(s): INR, PROTIME in the last 168 hours. ------------------------------------------------------------------------------------------------------------------- No results for input(s): DDIMER in the last 72 hours. -------------------------------------------------------------------------------------------------------------------  Cardiac Enzymes No results for input(s): CKMB, TROPONINI, MYOGLOBIN in the last 168 hours.  Invalid input(s): CK ------------------------------------------------------------------------------------------------------------------ Invalid input(s): POCBNP  ---------------------------------------------------------------------------------------------------------------  Urinalysis    Component Value Date/Time   COLORURINE YELLOW (A) 02/07/2018 1848   APPEARANCEUR HAZY (A) 02/07/2018 1848   LABSPEC 1.015 02/07/2018 1848   PHURINE 5.0 02/07/2018 1848   GLUCOSEU NEGATIVE 02/07/2018 1848   HGBUR SMALL (A) 02/07/2018 1848   BILIRUBINUR NEGATIVE 02/07/2018 1848   KETONESUR NEGATIVE 02/07/2018 1848   PROTEINUR 30 (A) 02/07/2018 1848   NITRITE NEGATIVE 02/07/2018 1848   LEUKOCYTESUR NEGATIVE 02/07/2018 1848     RADIOLOGY: Dg Chest 2 View  Result Date: 02/07/2018 CLINICAL DATA:  Shortness of breath and weakness, history of tobacco use EXAM: CHEST - 2 VIEW COMPARISON:  02/12/2014 FINDINGS: Cardiac shadow is within normal limits. The lungs are well aerated bilaterally with diffuse interstitial changes. A pleural-based wedge-shaped density is noted in the left upper lobe. Given the short-term history this likely represents an acute  on chronic infiltrate. Patchy basilar infiltrate on the left is noted as  well. No sizable effusion is seen. No acute bony abnormality is seen. IMPRESSION: Changes most consistent with multifocal pneumonia. Followup PA and lateral chest X-ray is recommended in 3-4 weeks following trial of antibiotic therapy to ensure resolution and exclude underlying malignancy. Electronically Signed   By: Inez Catalina M.D.   On: 02/07/2018 19:20    EKG: Orders placed or performed during the hospital encounter of 02/07/18  . EKG 12-Lead  . EKG 12-Lead  . ED EKG  . ED EKG  . ED EKG 12-Lead  . ED EKG 12-Lead    IMPRESSION AND PLAN: Patient 62 year old African-American female presenting with cough shortness of breath  1.  Sepsis due to multifocal pneumonia We will treat with IV Levaquin Obtain sputum cultures if possible Use PRN nebs 2.  Diabetes type 2 Place on sliding scale insulin Continue Lantus and metformin 3.  Essential hypertension continue Norvasc and lisinopril, triamterene HCTZ and metoprolol  4.  Hyperlipidemia continue therapy with Lipitor  5.  Miscellaneous Lovenox for DVT prophylaxis  All the records are reviewed and case discussed with ED provider. Management plans discussed with the patient, family and they are in agreement.  CODE STATUS: Full    TOTAL TIME TAKING CARE OF THIS PATIENT: 55 minutes.    Dustin Flock M.D on 02/07/2018 at 9:18 PM  Between 7am to 6pm - Pager - [phone]  After 6pm go to [email or web address] - password [email or web address] [facility]  [phone]  CC: Primary care physician; Glendon Axe, MD

## 2018-02-07 NOTE — Progress Notes (Signed)
ANTIBIOTIC CONSULT NOTE - INITIAL  Pharmacy Consult for  Levaquin  Indication: CAP   Allergies  Allergen Reactions  . Ibuprofen     Other reaction(s): Other (See Comments) Is not supposed to take because of her kidneys.  . Penicillins     Has patient had a PCN reaction causing immediate rash, facial/tongue/throat swelling, SOB or lightheadedness with hypotension: Unknown Has patient had a PCN reaction causing severe rash involving mucus membranes or skin necrosis: Unknown Has patient had a PCN reaction that required hospitalization: Unknown Has patient had a PCN reaction occurring within the last 10 years: Unknown If all of the above answers are "NO", then may proceed with Cephalosporin use.    Patient Measurements: Height: 5' (152.4 cm) Weight: 183 lb (83 kg) IBW/kg (Calculated) : 45.5 Adjusted Body Weight:   Vital Signs: Temp: 99.1 F (37.3 C) (06/17 1836) Temp Source: Oral (06/17 1836) BP: 132/81 (06/17 2030) Pulse Rate: 100 (06/17 2030) Intake/Output from previous day: No intake/output data recorded. Intake/Output from this shift: No intake/output data recorded.  Labs: Recent Labs    02/07/18 1848  WBC 15.6*  HGB 10.8*  PLT 683*  CREATININE 1.09*   Estimated Creatinine Clearance: 50.5 mL/min (A) (by C-G formula based on SCr of 1.09 mg/dL (H)). No results for input(s): VANCOTROUGH, VANCOPEAK, VANCORANDOM, GENTTROUGH, GENTPEAK, GENTRANDOM, TOBRATROUGH, TOBRAPEAK, TOBRARND, AMIKACINPEAK, AMIKACINTROU, AMIKACIN in the last 72 hours.   Microbiology: No results found for this or any previous visit (from the past 720 hour(s)).  Medical History: Past Medical History:  Diagnosis Date  . DM2 (diabetes mellitus, type 2) ([place])   . Hyperlipemia   . Hypertension   . Osteomyelitis ([place])   . RA (rheumatoid arthritis) ([facility])     Medications:   Assessment: CrCl = 50.5 ml/min   Goal of Therapy:    Plan:  Levaquin 750 mg IV X 1 given on 6/17 @ 20:46.  Levaquin 500  mg IV Q24H ordered to start on 6/18 @ 18:00.   Jacob Chamblee D 02/07/2018,8:49 PM

## 2018-02-07 NOTE — ED Provider Notes (Signed)
[facility] Emergency Department Provider Note  Time seen: 7:57 PM  I have reviewed the triage vital signs and the nursing notes.   HISTORY  Chief Complaint Shortness of Breath; Emesis; and Weakness    HPI Tamara Parrish is a 62 y.o. female with a past medical history of hypertension who presents to the emergency department for generalized fatigue, weakness, shortness of breath with exertion.  Patient states her symptoms have been ongoing for 2 to 3 weeks.  Denies any fever.  Denies any chest pain or abdominal pain.  States she gets very nauseated and will often times vomit.  States she has no appetite.  States she will get extremely fatigued with minimal ambulation states if she walks 10 feet she will have to stop and rest.  Patient states she had pneumonia proximal me 25 years ago and this feels somewhat similar.  States very minimal cough but no sputum production.  No known fever.   No past medical history on file.  There are no active problems to display for this patient.   Prior to Admission medications   Not on File    Allergies  Allergen Reactions  . Ibuprofen     Other reaction(s): Other (See Comments) Is not supposed to take because of her kidneys.  . Penicillins     Other reaction(s): Unknown    No family history on file.  Social History Social History   Tobacco Use  . Smoking status: Not on file  Substance Use Topics  . Alcohol use: Not on file  . Drug use: Not on file    Review of Systems Constitutional: Negative for fever. Eyes: Negative for visual complaints ENT: Negative for recent illness/congestion Cardiovascular: Negative for chest pain. Respiratory: Positive shortness of breath, worse with exertion. Gastrointestinal: Negative for abdominal pain.  Intermittent nausea vomiting.  Negative for diarrhea. Genitourinary: Negative for urinary compaints Musculoskeletal: Negative for musculoskeletal complaints Skin: Negative  for skin complaints  Neurological: Negative for headache All other ROS negative  ____________________________________________   PHYSICAL EXAM:  VITAL SIGNS: ED Triage Vitals  Enc Vitals Group     BP 02/07/18 1836 (!) 154/87     Pulse Rate 02/07/18 1836 (!) 105     Resp 02/07/18 1836 (!) 24     Temp 02/07/18 1836 99.1 F (37.3 C)     Temp Source 02/07/18 1836 Oral     SpO2 02/07/18 1836 91 %     Weight 02/07/18 1836 183 lb (83 kg)     Height 02/07/18 1836 5' (1.524 m)     Head Circumference --      Peak Flow --      Pain Score 02/07/18 1843 0     Pain Loc --      Pain Edu? --      Excl. in [place]? --     Constitutional: Alert and oriented.  Patient speaking in 2-3 word sentences due to shortness of breath. Eyes: Normal exam ENT   Head: Normocephalic and atraumatic.   Mouth/Throat: Mucous membranes are moist. Cardiovascular: Normal rate, regular rhythm around 100 bpm. Respiratory: Mild tachypnea.  No obvious wheeze rales or rhonchi. Gastrointestinal: Soft and nontender. No distention.  T Musculoskeletal: Nontender with normal range of motion in all extremities. No lower extremity tenderness or edema. Neurologic:  Normal speech and language. No gross focal neurologic deficits  Skin:  Skin is warm, dry and intact.  Psychiatric: Mood and affect are normal  ____________________________________________  EKG  EKG reviewed and interpreted by myself shows sinus tachycardia at 103 bpm with a narrow QRS, normal axis, largely normal intervals with nonspecific ST changes.  ____________________________________________    RADIOLOGY  Chest x-ray consistent with multifocal pneumonia.  ____________________________________________   INITIAL IMPRESSION / ASSESSMENT AND PLAN / ED COURSE  Pertinent labs & imaging results that were available during my care of the patient were reviewed by me and considered in my medical decision making (see chart for details).  Patient  presents to the emergency department for generalized fatigue weakness shortness of breath especially with exertion.  Differential this time include metabolic or left light abnormality, infectious etiology such as pneumonia or urinary tract infection, ACS.  Patient's labs have resulted showing a moderate leukocytosis, lactic acid of 2.0.  Patient's vitals consistent with sepsis criteria, labs consistent for sepsis.  Chest x-ray shows multifocal pneumonia.  I have initiated sepsis protocols will start on broad-spectrum antibiotics and admit to the hospitalist service for continued treatment.  CRITICAL CARE Performed by: Harvest Dark   Total critical care time: 30 minutes  Critical care time was exclusive of separately billable procedures and treating other patients.  Critical care was necessary to treat or prevent imminent or life-threatening deterioration.  Critical care was time spent personally by me on the following activities: development of treatment plan with patient and/or surrogate as well as nursing, discussions with consultants, evaluation of patient's response to treatment, examination of patient, obtaining history from patient or surrogate, ordering and performing treatments and interventions, ordering and review of laboratory studies, ordering and review of radiographic studies, pulse oximetry and re-evaluation of patient's condition.   ____________________________________________   FINAL CLINICAL IMPRESSION(S) / ED DIAGNOSES  Sepsis Multifocal pneumonia    Harvest Dark, MD 02/07/18 2002

## 2018-02-07 NOTE — ED Notes (Signed)
Dr Alfred Levins and Raquel, Charge RN, notified of pt's elevated Lactic Acid level as reported by lab at this time: 2.0 mmol/L.

## 2018-02-07 NOTE — ED Triage Notes (Signed)
Patient presents to the ED with shortness of breath, weakness, nausea and vomiting.  Patient states she became short of breath when she stopped smoking approx. 2 months ago but then lost her appetite and began to have nausea and vomiting approx. 3 weeks ago and states the last several days she has felt very weak and tired and like she is going to pass out, particularly when she stands or walks.

## 2018-02-08 LAB — BASIC METABOLIC PANEL
Anion gap: 7 (ref 5–15)
BUN: 22 mg/dL — AB (ref 6–20)
CHLORIDE: 108 mmol/L (ref 101–111)
CO2: 21 mmol/L — ABNORMAL LOW (ref 22–32)
Calcium: 8 mg/dL — ABNORMAL LOW (ref 8.9–10.3)
Creatinine, Ser: 0.83 mg/dL (ref 0.44–1.00)
GFR calc Af Amer: >60 mL/min (ref >60)
GFR calc non Af Amer: >60 mL/min (ref >60)
Glucose, Bld: 81 mg/dL (ref 65–99)
POTASSIUM: 3.9 mmol/L (ref 3.5–5.1)
SODIUM: 136 mmol/L (ref 135–145)

## 2018-02-08 LAB — GLUCOSE, CAPILLARY
GLUCOSE-CAPILLARY: 166 mg/dL — AB (ref 65–99)
GLUCOSE-CAPILLARY: 69 mg/dL (ref 65–99)
GLUCOSE-CAPILLARY: 94 mg/dL (ref 65–99)
GLUCOSE-CAPILLARY: 122 mg/dL — AB (ref 65–99)
GLUCOSE-CAPILLARY: 69 mg/dL (ref 65–99)
GLUCOSE-CAPILLARY: 97 mg/dL (ref 65–99)
GLUCOSE-CAPILLARY: 105 mg/dL — AB (ref 65–99)
Glucose-Capillary: 129 mg/dL — ABNORMAL HIGH (ref 65–99)

## 2018-02-08 LAB — CBC
HCT: 28.1 % — ABNORMAL LOW (ref 35.0–47.0)
HEMOGLOBIN: 9.4 g/dL — AB (ref 12.0–16.0)
MCH: 30.2 pg (ref 26.0–34.0)
MCHC: 33.5 g/dL (ref 32.0–36.0)
MCV: 90.1 fL (ref 80.0–100.0)
Platelets: 616 10*3/uL — ABNORMAL HIGH (ref 150–440)
RBC: 3.12 MIL/uL — AB (ref 3.80–5.20)
RDW: 15.1 % — ABNORMAL HIGH (ref 11.5–14.5)
WBC: 13.4 10*3/uL — ABNORMAL HIGH (ref 3.6–11.0)

## 2018-02-08 LAB — MRSA PCR SCREENING: MRSA BY PCR: NEGATIVE

## 2018-02-08 MED ORDER — TRIAMTERENE-HCTZ 37.5-25 MG PO TABS
1.0000 | ORAL_TABLET | Freq: Every day | ORAL | Status: DC
  Administered 2018-02-09 – 2018-02-11 (×3): 1 via ORAL
  Filled 2018-02-08 (×4): qty 1

## 2018-02-08 MED ORDER — METHOTREXATE 2.5 MG PO TABS: 10.0000 mg | ORAL_TABLET | ORAL | Status: DC

## 2018-02-08 MED ORDER — ALPRAZOLAM 0.25 MG PO TABS
0.2500 mg | ORAL_TABLET | Freq: Two times a day (BID) | ORAL | Status: DC | PRN
  Administered 2018-02-08 – 2018-02-09 (×4): 0.25 mg via ORAL
  Filled 2018-02-08 (×4): qty 1

## 2018-02-08 MED ORDER — INSULIN GLARGINE 100 UNIT/ML ~~LOC~~ SOLN
30.0000 [IU] | Freq: Every day | SUBCUTANEOUS | Status: DC
  Filled 2018-02-08 (×4): qty 0.3

## 2018-02-08 MED ORDER — GUAIFENESIN-DM 100-10 MG/5ML PO SYRP
5.0000 mL | ORAL_SOLUTION | ORAL | Status: DC | PRN
  Administered 2018-02-08 – 2018-02-09 (×2): 5 mL via ORAL
  Filled 2018-02-08 (×2): qty 5

## 2018-02-08 NOTE — Progress Notes (Signed)
Inpatient Diabetes Program Recommendations  AACE/ADA: New Consensus Statement on Inpatient Glycemic Control (2015)  Target Ranges:  Prepandial:   less than 140 mg/dL      Peak postprandial:   less than 180 mg/dL (1-2 hours)      Critically ill patients:  140 - 180 mg/dL   Results for Tamara Parrish, Tamara Parrish (MRN [id number]) as of 02/08/2018 10:37  Ref. Range 02/07/2018 22:35 02/08/2018 07:31 02/08/2018 07:55  Glucose-Capillary Latest Ref Range: 65 - 99 mg/dL 166 (H) 69 94    Home DM Meds: Basaglar 36 units QHS        Metformin 1000 mg BID  Current Orders: Lantus 36 units QHS       Novolog Sensitive Correction Scale/ SSI (0-9 units) TID AC      Metformin 1000 mg BID     MD- Note patient with mild Hypoglycemia this AM after getting 36 units Lantus last PM.  Please consider reducing Lantus slightly to 30 units QHS     --Will follow patient during hospitalization--  Wyn Quaker RN, MSN, CDE Diabetes Coordinator Inpatient Glycemic Control Team Team Pager: [phone] (8a-5p)

## 2018-02-08 NOTE — Progress Notes (Signed)
[facility] [place] NAME: Tamara Parrish    MR#:  [id number]  DATE OF BIRTH:  Feb 27, 1956  SUBJECTIVE:  CHIEF COMPLAINT:   Chief Complaint  Patient presents with  . Shortness of Breath  . Emesis  . Weakness   Shortness of breath and weakness, on oxygen by nasal cannula 2 L. REVIEW OF SYSTEMS:  Review of Systems  Constitutional: Positive for malaise/fatigue. Negative for chills and fever.  HENT: Negative for sore throat.   Eyes: Negative for blurred vision and double vision.  Respiratory: Positive for shortness of breath. Negative for cough, hemoptysis, wheezing and stridor.   Cardiovascular: Negative for chest pain, palpitations, orthopnea and leg swelling.  Gastrointestinal: Negative for abdominal pain, blood in stool, diarrhea, melena, nausea and vomiting.  Genitourinary: Negative for dysuria, flank pain and hematuria.  Musculoskeletal: Negative for back pain and joint pain.  Neurological: Negative for dizziness, sensory change, focal weakness, seizures, loss of consciousness, weakness and headaches.  Endo/Heme/Allergies: Negative for polydipsia.  Psychiatric/Behavioral: Negative for depression. The patient is nervous/anxious.     DRUG ALLERGIES:   Allergies  Allergen Reactions  . Ibuprofen     Other reaction(s): Other (See Comments) Is not supposed to take because of her kidneys.  . Penicillins     Has patient had a PCN reaction causing immediate rash, facial/tongue/throat swelling, SOB or lightheadedness with hypotension: Unknown Has patient had a PCN reaction causing severe rash involving mucus membranes or skin necrosis: Unknown Has patient had a PCN reaction that required hospitalization: Unknown Has patient had a PCN reaction occurring within the last 10 years: Unknown If all of the above answers are "NO", then may proceed with Cephalosporin use.   VITALS:  Blood pressure (!) 133/54, pulse 88, temperature 98.4 F  (36.9 C), temperature source Oral, resp. rate (!) 22, height 5' (1.524 m), weight 183 lb (83 kg), SpO2 93 %. PHYSICAL EXAMINATION:  Physical Exam  Constitutional: She is oriented to person, place, and time.  HENT:  Head: Normocephalic.  Nose: Nose normal.  Mouth/Throat: Oropharynx is clear and moist.  Eyes: Pupils are equal, round, and reactive to light. Conjunctivae and EOM are normal. No scleral icterus.  Neck: Normal range of motion. Neck supple. No JVD present. No tracheal deviation present.  Cardiovascular: Normal rate, regular rhythm and normal heart sounds. Exam reveals no gallop.  No murmur heard. Pulmonary/Chest: Effort normal. No respiratory distress. She has no wheezes. She has rales.  Abdominal: Soft. Bowel sounds are normal. She exhibits no distension. There is no tenderness. There is no rebound.  Musculoskeletal: Normal range of motion. She exhibits no edema or tenderness.  Neurological: She is alert and oriented to person, place, and time. No cranial nerve deficit.  Skin: No rash noted. No erythema.  Psychiatric: She has a normal mood and affect.   LABORATORY PANEL:  Female CBC Recent Labs  Lab 02/08/18 0431  WBC 13.4*  HGB 9.4*  HCT 28.1*  PLT 616*   ------------------------------------------------------------------------------------------------------------------ Chemistries  Recent Labs  Lab 02/07/18 1848 02/08/18 0431  NA 133* 136  K 4.1 3.9  CL 102 108  CO2 20* 21*  GLUCOSE 194* 81  BUN 29* 22*  CREATININE 1.09* 0.83  CALCIUM 8.6* 8.0*  AST 25  --   ALT 11*  --   ALKPHOS 104  --   BILITOT 0.3  --    RADIOLOGY:  Dg Chest 2 View  Result Date: 02/07/2018 CLINICAL DATA:  Shortness of  breath and weakness, history of tobacco use EXAM: CHEST - 2 VIEW COMPARISON:  02/12/2014 FINDINGS: Cardiac shadow is within normal limits. The lungs are well aerated bilaterally with diffuse interstitial changes. A pleural-based wedge-shaped density is noted in the left  upper lobe. Given the short-term history this likely represents an acute on chronic infiltrate. Patchy basilar infiltrate on the left is noted as well. No sizable effusion is seen. No acute bony abnormality is seen. IMPRESSION: Changes most consistent with multifocal pneumonia. Followup PA and lateral chest X-ray is recommended in 3-4 weeks following trial of antibiotic therapy to ensure resolution and exclude underlying malignancy. Electronically Signed   By: Inez Catalina M.D.   On: 02/07/2018 19:20   ASSESSMENT AND PLAN:   Patient 62 year old African-American female presenting with cough shortness of breath  1.  Sepsis due to multifocal pneumonia Continue IV Levaquin Obtain sputum cultures if possible Use PRN nebs, Robitussin as needed.  Acute respiratory failure with hypoxia due to above.  Try to wean off oxygen, NEB PRN.  2.  Diabetes type 2 On sliding scale insulin Decrease Lantus to 30 units at bedtime and discontinue metformin.  3.  Essential hypertension continue Norvasc and lisinopril, triamterene HCTZ and metoprolol  4.  Hyperlipidemia continue therapy with Lipitor Generalized weakness.  PT evaluation. All the records are reviewed and case discussed with Care Management/Social Worker. Management plans discussed with the patient, daughter and they are in agreement.  CODE STATUS: Full Code  TOTAL TIME TAKING CARE OF THIS PATIENT: 36 minutes.   More than 50% of the time was spent in counseling/coordination of care: YES  POSSIBLE D/C IN 2 DAYS, DEPENDING ON CLINICAL CONDITION.   Demetrios Loll M.D on 02/08/2018 at 3:16 PM  Between 7am to 6pm - Pager - [phone]  After 6pm go to [email or web address] - [facility]

## 2018-02-08 NOTE — Progress Notes (Signed)
Patient admitted for sepsis s/t PNA. Patient has a h/o of RA and takes methotrexate weekly. Considering patient has active infection spoke to MD to hold methotrexate.  MD notified and agrees w/ plan.  Tobie Lords, PharmD, BCPS Clinical Pharmacist 02/08/2018

## 2018-02-08 NOTE — Progress Notes (Signed)
   02/08/18 0955  Clinical Encounter Type  Visited With Patient  Visit Type Initial   Chaplain visited during rounds.  She is teary and says she's thankful to God for helping her stop smoking 2 months ago because this pneumonia could have been so much worse.  She says she's not in any pain and is just waiting for the infection to go away.  She's had some trouble sleeping and "was up all night".  States her daughter and other relatives were visiting but just left.  Patient requested prayer.  Chaplain provided active listening, emotional support, and prayer.

## 2018-02-09 LAB — URINE CULTURE

## 2018-02-09 LAB — GLUCOSE, CAPILLARY
GLUCOSE-CAPILLARY: 66 mg/dL (ref 65–99)
Glucose-Capillary: 136 mg/dL — ABNORMAL HIGH (ref 65–99)
Glucose-Capillary: 135 mg/dL — ABNORMAL HIGH (ref 65–99)
Glucose-Capillary: 108 mg/dL — ABNORMAL HIGH (ref 65–99)

## 2018-02-09 LAB — HIV ANTIBODY (ROUTINE TESTING W REFLEX): HIV SCREEN 4TH GENERATION: NONREACTIVE

## 2018-02-09 MED ORDER — LEVOFLOXACIN 500 MG PO TABS
500.0000 mg | ORAL_TABLET | Freq: Every day | ORAL | Status: DC
  Administered 2018-02-10 – 2018-02-11 (×2): 500 mg via ORAL
  Filled 2018-02-09 (×2): qty 1

## 2018-02-09 NOTE — Progress Notes (Addendum)
Pt was assessed and noted her O2 sats on 2L O2 dropped in standing with short sidesteps to 85%.  Talked with her nurse about this, plan is to progress to more mobility as pt tolerates.  Acute therapy for strengthening and monitoring of vitals, and per nsg to pt is expected she will need to use O2 at dc so will anticipate her sats to be a difficulty to get directly home.  Follow for the purpose of trying to get home although SNF is requested.       02/09/18 1200  PT Visit Information  Last PT Received On 02/09/18  Assistance Needed +1  History of Present Illness 62 yo female with onset of PNA with sepsis was admitted, had SOB and emesis, continuing to feel nauseated.  On 2L O2 but not yet using O2 at home.  PMHx:  DM, HTN, RA, osteomyelitis, L great toe amputation  Precautions  Precautions Fall (telemetry)  Restrictions  Weight Bearing Restrictions No  Home Living  Family/patient expects to be discharged to: Private residence  Living Arrangements Alone  Available Help at Discharge Family;Available PRN/intermittently  Type of Home House  Home Access Level entry  Home Layout One level  Tax adviser - 2 wheels;Cane - single point  Prior Function  Level of Independence Independent with assistive device(s)  Communication  Communication No difficulties  Pain Assessment  Pain Assessment No/denies pain  Cognition  Arousal/Alertness Lethargic  Behavior During Therapy Flat affect  Overall Cognitive Status Impaired/Different from baseline  Area of Impairment Following commands;Safety/judgement;Awareness;Problem solving  Following Commands Follows one step commands with increased time  Safety/Judgement Decreased awareness of safety;Decreased awareness of deficits  Awareness Intellectual  Problem Solving Decreased initiation;Requires verbal cues  General Comments slow to move but also has O2 sats of 85% on 2 LO2 wiht standing and side stepping  Upper  Extremity Assessment  Upper Extremity Assessment Generalized weakness  Lower Extremity Assessment  Lower Extremity Assessment Generalized weakness  Cervical / Trunk Assessment  Cervical / Trunk Assessment Kyphotic  Bed Mobility  Overal bed mobility Needs Assistance  Bed Mobility Supine to Sit  Supine to sit Min assist;Mod assist  Transfers  Overall transfer level Needs assistance  Equipment used 1 person hand held assist  Transfers Sit to/from Stand  Sit to Stand Min assist  General transfer comment cued for hand placement  Ambulation/Gait  Ambulation/Gait assistance Min assist  Gait Distance (Feet) 6 Feet  Assistive device 1 person hand held assist  Gait Pattern/deviations Step-to pattern;Decreased stride length;Wide base of support;Trunk flexed  General Gait Details short sidesteps to test her tolerance for activity  Gait velocity reduced  Gait velocity interpretation <1.8 ft/sec, indicate of risk for recurrent falls  Balance  Overall balance assessment Needs assistance  Sitting-balance support Feet supported  Sitting balance-Leahy Scale Fair  Postural control Posterior lean  Standing balance support Bilateral upper extremity supported;During functional activity  Standing balance-Leahy Scale Poor  Exercises  Exercises Other exercises (strength was 3+ to 4- LE's)  PT - End of Session  Equipment Utilized During Treatment Gait belt;Oxygen  Activity Tolerance Patient limited by fatigue;Treatment limited secondary to medical complications (Comment)  Patient left in bed;with call bell/phone within reach;with bed alarm set;with nursing/sitter in room  Nurse Communication Mobility status  PT Assessment  PT Recommendation/Assessment Patient needs continued PT services  PT Visit Diagnosis Unsteadiness on feet (R26.81);Muscle weakness (generalized) (M62.81);Difficulty in walking, not elsewhere classified (R26.2)  PT Problem List Decreased strength;Decreased range  of motion;Decreased  activity tolerance;Decreased balance;Decreased mobility;Decreased coordination;Decreased knowledge of use of DME;Decreased safety awareness;Decreased cognition;Decreased knowledge of precautions;Cardiopulmonary status limiting activity  Barriers to Discharge Decreased caregiver support  Barriers to Discharge Comments home alone with limited help  PT Plan  PT Frequency (ACUTE ONLY) Min 2X/week  PT Treatment/Interventions (ACUTE ONLY) DME instruction;Gait training;Functional mobility training;Therapeutic activities;Therapeutic exercise;Balance training;Neuromuscular re-education;Patient/family education  AM-PAC PT "6 Clicks" Daily Activity Outcome Measure  Difficulty turning over in bed (including adjusting bedclothes, sheets and blankets)? 1  Difficulty moving from lying on back to sitting on the side of the bed?  1  Difficulty sitting down on and standing up from a chair with arms (e.g., wheelchair, bedside commode, etc,.)? 1  Help needed moving to and from a bed to chair (including a wheelchair)? 3  Help needed walking in hospital room? 3  Help needed climbing 3-5 steps with a railing?  2  6 Click Score 11  Mobility G Code  CL  PT Recommendation  Follow Up Recommendations Supervision for mobility/OOB;SNF  PT equipment Rolling walker with 5" wheels (if hers is not in good repair)  Individuals Consulted  Consulted and Agree with Results and Recommendations Patient  Acute Rehab PT Goals  Patient Stated Goal to walk and stay safe  PT Goal Formulation With patient  Potential to Achieve Goals Good  PT Time Calculation  PT Start Time (ACUTE ONLY) 1102  PT Stop Time (ACUTE ONLY) 1136  PT Time Calculation (min) (ACUTE ONLY) 34 min  PT G-Codes **NOT FOR INPATIENT CLASS**  Functional Assessment Tool Used AM-PAC 6 Clicks Basic Mobility  PT General Charges  $$ ACUTE PT VISIT 1 Visit  PT Evaluation  $PT Eval Moderate Complexity 1 Mod  PT Treatments  $Therapeutic Activity 8-22 mins  Written  Expression  Dominant Hand Right    Mee Hives, PT MS Acute Rehab Dept. Number: [place] and [place]

## 2018-02-09 NOTE — Progress Notes (Signed)
PHARMACIST - PHYSICIAN COMMUNICATION DR:   Bridgett Larsson CONCERNING: Antibiotic IV to Oral Route Change Policy  RECOMMENDATION: This patient is receiving Levaquin by the intravenous route.  Based on criteria approved by the Pharmacy and Therapeutics Committee, the antibiotic(s) is/are being converted to the equivalent oral dose form(s) based on our conversation.   DESCRIPTION: These criteria include:  Patient being treated for a respiratory tract infection, urinary tract infection, cellulitis or clostridium difficile associated diarrhea if on metronidazole  The patient is not neutropenic and does not exhibit a GI malabsorption state  The patient is eating (either orally or via tube) and/or has been taking other orally administered medications for a least 24 hours  The patient is improving clinically and has a Tmax < 100.5  Vallery Sa, PhamD

## 2018-02-09 NOTE — Progress Notes (Signed)
[facility] [place] NAME: Tamara Parrish    MR#:  [id number]  DATE OF BIRTH:  [birth year]  SUBJECTIVE:  CHIEF COMPLAINT:   Chief Complaint  Patient presents with  . Shortness of Breath  . Emesis  . Weakness   Better shortness of breath and weakness, on oxygen by nasal cannula 2 L. REVIEW OF SYSTEMS:  Review of Systems  Constitutional: Positive for malaise/fatigue. Negative for chills and fever.  HENT: Negative for sore throat.   Eyes: Negative for blurred vision and double vision.  Respiratory: Positive for shortness of breath. Negative for cough, hemoptysis, wheezing and stridor.   Cardiovascular: Negative for chest pain, palpitations, orthopnea and leg swelling.  Gastrointestinal: Negative for abdominal pain, blood in stool, diarrhea, melena, nausea and vomiting.  Genitourinary: Negative for dysuria, flank pain and hematuria.  Musculoskeletal: Negative for back pain and joint pain.  Neurological: Negative for dizziness, sensory change, focal weakness, seizures, loss of consciousness, weakness and headaches.  Endo/Heme/Allergies: Negative for polydipsia.  Psychiatric/Behavioral: Negative for depression. The patient is nervous/anxious.     DRUG ALLERGIES:   Allergies  Allergen Reactions  . Ibuprofen     Other reaction(s): Other (See Comments) Is not supposed to take because of her kidneys.  . Penicillins     Has patient had a PCN reaction causing immediate rash, facial/tongue/throat swelling, SOB or lightheadedness with hypotension: Unknown Has patient had a PCN reaction causing severe rash involving mucus membranes or skin necrosis: Unknown Has patient had a PCN reaction that required hospitalization: Unknown Has patient had a PCN reaction occurring within the last 10 years: Unknown If all of the above answers are "NO", then may proceed with Cephalosporin use.   VITALS:  Blood pressure (!) 150/75, pulse (!) 101, temperature  98.2 F (36.8 C), temperature source Oral, resp. rate (!) 21, height 5' (1.524 m), weight 183 lb (83 kg), SpO2 90 %. PHYSICAL EXAMINATION:  Physical Exam  Constitutional: She is oriented to person, place, and time.  HENT:  Head: Normocephalic.  Nose: Nose normal.  Mouth/Throat: Oropharynx is clear and moist.  Eyes: Pupils are equal, round, and reactive to light. Conjunctivae and EOM are normal. No scleral icterus.  Neck: Normal range of motion. Neck supple. No JVD present. No tracheal deviation present.  Cardiovascular: Normal rate, regular rhythm and normal heart sounds. Exam reveals no gallop.  No murmur heard. Pulmonary/Chest: Effort normal. No respiratory distress. She has no wheezes. She has rales.  Abdominal: Soft. Bowel sounds are normal. She exhibits no distension. There is no tenderness. There is no rebound.  Musculoskeletal: Normal range of motion. She exhibits no edema or tenderness.  Neurological: She is alert and oriented to person, place, and time. No cranial nerve deficit.  Skin: No rash noted. No erythema.  Psychiatric: She has a normal mood and affect.   LABORATORY PANEL:  Female CBC Recent Labs  Lab 02/08/18 0431  WBC 13.4*  HGB 9.4*  HCT 28.1*  PLT 616*   ------------------------------------------------------------------------------------------------------------------ Chemistries  Recent Labs  Lab 02/07/18 1848 02/08/18 0431  NA 133* 136  K 4.1 3.9  CL 102 108  CO2 20* 21*  GLUCOSE 194* 81  BUN 29* 22*  CREATININE 1.09* 0.83  CALCIUM 8.6* 8.0*  AST 25  --   ALT 11*  --   ALKPHOS 104  --   BILITOT 0.3  --    RADIOLOGY:  No results found. ASSESSMENT AND PLAN:   Patient 62 year old African-American  female presenting with cough shortness of breath  1.  Sepsis due to multifocal pneumonia Continue IV Levaquin Blood cultures negative so far.  Obtain sputum cultures if possible Use PRN nebs, Robitussin as needed.  Acute respiratory failure with  hypoxia due to above.  Try to wean off oxygen, NEB PRN.  2.  Diabetes type 2 On sliding scale insulin Decreased Lantus to 30 units at bedtime and discontinued metformin.  3.  Essential hypertension continue Norvasc and lisinopril, triamterene HCTZ and metoprolol  4.  Hyperlipidemia continue therapy with Lipitor Generalized weakness.  PT evaluation. All the records are reviewed and case discussed with Care Management/Social Worker. Management plans discussed with the patient, daughter and they are in agreement.  CODE STATUS: Full Code  TOTAL TIME TAKING CARE OF THIS PATIENT: 33 minutes.   More than 50% of the time was spent in counseling/coordination of care: YES  POSSIBLE D/C IN 2 DAYS, DEPENDING ON CLINICAL CONDITION.   Demetrios Loll M.D on 02/09/2018 at 12:40 PM  Between 7am to 6pm - Pager - [phone]  After 6pm go to [email or web address] - [facility]

## 2018-02-09 NOTE — Progress Notes (Signed)
PT Cancellation Note  Patient Details Name: Tamara Parrish MRN: [id number] DOB: May 16, 1956   Cancelled Treatment:    Reason Eval/Treat Not Completed: Other (comment).  Pt was just started on an IV med and nursing asked PT to wait to see her.  Will try later as time and pt allow.   Ramond Dial 02/09/2018, 10:01 AM   Mee Hives, PT MS Acute Rehab Dept. Number: [place] and [place]

## 2018-02-09 NOTE — Care Management (Signed)
Barrier:  Patient still on acute O2. RN to attempt to wean.  PT pending

## 2018-02-10 LAB — GLUCOSE, CAPILLARY
GLUCOSE-CAPILLARY: 198 mg/dL — AB (ref 65–99)
GLUCOSE-CAPILLARY: 146 mg/dL — AB (ref 65–99)
GLUCOSE-CAPILLARY: 138 mg/dL — AB (ref 65–99)
Glucose-Capillary: 126 mg/dL — ABNORMAL HIGH (ref 65–99)
Glucose-Capillary: 109 mg/dL — ABNORMAL HIGH (ref 65–99)

## 2018-02-10 NOTE — Progress Notes (Signed)
Physical Therapy Treatment Patient Details Name: Tamara Parrish MRN: [id number] DOB: 1956-02-23 Today's Date: 02/10/2018    History of Present Illness 62 yo female with onset of PNA with sepsis was admitted, had SOB and emesis, continuing to feel nauseated.  On 2L O2 but not yet using O2 at home.  PMHx:  DM, HTN, RA, osteomyelitis, L great toe amputation    PT Comments    Pt sitting EOB upon arrival.  Anxious but agrees to session.  1.5 lpm with sats 89/90% ar rest.  She agreed to walk to door and back in room.  Upon standing, she quickly reached for my hand for stability and was able to take slow cautious steps to door.  Once turned, she reached for wheelchair handles for support (used to hold O2 tank) to return to bed.  After short seated rest, she transferred to commode for BM. Sats decreased to 80% after gait.  Discussed with pt general feeling of unease with gait.  Pt repeated several times "I can walk".  She seemed resistant to assistive device and education was provided regarding balance and energy conservation by using walker.  She did not seem interested at this point but it would be beneficial to her during her recovery.     Follow Up Recommendations  SNF     Equipment Recommendations  Rolling walker with 5" wheels    Recommendations for Other Services       Precautions / Restrictions Precautions Precautions: Fall Restrictions Weight Bearing Restrictions: No    Mobility  Bed Mobility Overal bed mobility: Modified Independent                Transfers Overall transfer level: Modified independent   Transfers: Sit to/from Stand;Stand Pivot Transfers Sit to Stand: Supervision Stand pivot transfers: Supervision          Ambulation/Gait Ambulation/Gait assistance: Min assist Gait Distance (Feet): 15 Feet Assistive device: 1 person hand held assist;Pushed wheelchair Gait Pattern/deviations: Step-through pattern;Decreased step length - right;Decreased  step length - left   Gait velocity interpretation: <1.31 ft/sec, indicative of household ambulator General Gait Details: seems generally nervous with gait reaching for writers hand and wheelchair to push   Stairs             Wheelchair Mobility    Modified Rankin (Stroke Patients Only)       Balance Overall balance assessment: Needs assistance Sitting-balance support: Feet supported Sitting balance-Leahy Scale: Fair     Standing balance support: Bilateral upper extremity supported;During functional activity Standing balance-Leahy Scale: Fair                              Cognition Arousal/Alertness: Awake/alert Behavior During Therapy: WFL for tasks assessed/performed Overall Cognitive Status: Within Functional Limits for tasks assessed                                        Exercises      General Comments        Pertinent Vitals/Pain Pain Assessment: No/denies pain    Home Living                      Prior Function            PT Goals (current goals can now be found in the care plan section) Progress towards  PT goals: Progressing toward goals    Frequency    Min 2X/week      PT Plan Current plan remains appropriate    Co-evaluation              AM-PAC PT "6 Clicks" Daily Activity  Outcome Measure  Difficulty turning over in bed (including adjusting bedclothes, sheets and blankets)?: None Difficulty moving from lying on back to sitting on the side of the bed? : None Difficulty sitting down on and standing up from a chair with arms (e.g., wheelchair, bedside commode, etc,.)?: A Little Help needed moving to and from a bed to chair (including a wheelchair)?: A Little Help needed walking in hospital room?: A Little Help needed climbing 3-5 steps with a railing? : A Lot 6 Click Score: 19    End of Session Equipment Utilized During Treatment: Gait belt;Oxygen Activity Tolerance: Patient limited by  fatigue Patient left: Other (comment)         Time: 1501-1510 PT Time Calculation (min) (ACUTE ONLY): 9 min  Charges:  $Gait Training: 8-22 mins                    G Codes:       Chesley Noon, PTA 02/10/18, 3:40 PM

## 2018-02-10 NOTE — Progress Notes (Signed)
[facility] at [place] NAME: Tamara Parrish    MR#:  [id number]  DATE OF BIRTH:  [birth year]  SUBJECTIVE:  CHIEF COMPLAINT:   Chief Complaint  Patient presents with  . Shortness of Breath  . Emesis  . Weakness   Better shortness of breath and weakness, still on oxygen by nasal cannula 2 L. Hypoxia at 85% while off O2 [place]. REVIEW OF SYSTEMS:  Review of Systems  Constitutional: Positive for malaise/fatigue. Negative for chills and fever.  HENT: Negative for sore throat.   Eyes: Negative for blurred vision and double vision.  Respiratory: Positive for shortness of breath. Negative for cough, hemoptysis, wheezing and stridor.   Cardiovascular: Negative for chest pain, palpitations, orthopnea and leg swelling.  Gastrointestinal: Negative for abdominal pain, blood in stool, diarrhea, melena, nausea and vomiting.  Genitourinary: Negative for dysuria, flank pain and hematuria.  Musculoskeletal: Negative for back pain and joint pain.  Neurological: Negative for dizziness, sensory change, focal weakness, seizures, loss of consciousness, weakness and headaches.  Endo/Heme/Allergies: Negative for polydipsia.  Psychiatric/Behavioral: Negative for depression. The patient is nervous/anxious.     DRUG ALLERGIES:   Allergies  Allergen Reactions  . Ibuprofen     Other reaction(s): Other (See Comments) Is not supposed to take because of her kidneys.  . Penicillins     Has patient had a PCN reaction causing immediate rash, facial/tongue/throat swelling, SOB or lightheadedness with hypotension: Unknown Has patient had a PCN reaction causing severe rash involving mucus membranes or skin necrosis: Unknown Has patient had a PCN reaction that required hospitalization: Unknown Has patient had a PCN reaction occurring within the last 10 years: Unknown If all of the above answers are "NO", then may proceed with Cephalosporin use.   VITALS:  Blood pressure  103/71, pulse 72, temperature 97.6 F (36.4 C), temperature source Oral, resp. rate (!) 24, height 5' (1.524 m), weight 183 lb (83 kg), SpO2 95 %. PHYSICAL EXAMINATION:  Physical Exam  Constitutional: She is oriented to person, place, and time.  HENT:  Head: Normocephalic.  Nose: Nose normal.  Mouth/Throat: Oropharynx is clear and moist.  Eyes: Pupils are equal, round, and reactive to light. Conjunctivae and EOM are normal. No scleral icterus.  Neck: Normal range of motion. Neck supple. No JVD present. No tracheal deviation present.  Cardiovascular: Normal rate, regular rhythm and normal heart sounds. Exam reveals no gallop.  No murmur heard. Pulmonary/Chest: Effort normal. No respiratory distress. She has no wheezes. She has rales.  Abdominal: Soft. Bowel sounds are normal. She exhibits no distension. There is no tenderness. There is no rebound.  Musculoskeletal: Normal range of motion. She exhibits no edema or tenderness.  Neurological: She is alert and oriented to person, place, and time. No cranial nerve deficit.  Skin: No rash noted. No erythema.  Psychiatric: She has a normal mood and affect.   LABORATORY PANEL:  Female CBC Recent Labs  Lab 02/08/18 0431  WBC 13.4*  HGB 9.4*  HCT 28.1*  PLT 616*   ------------------------------------------------------------------------------------------------------------------ Chemistries  Recent Labs  Lab 02/07/18 1848 02/08/18 0431  NA 133* 136  K 4.1 3.9  CL 102 108  CO2 20* 21*  GLUCOSE 194* 81  BUN 29* 22*  CREATININE 1.09* 0.83  CALCIUM 8.6* 8.0*  AST 25  --   ALT 11*  --   ALKPHOS 104  --   BILITOT 0.3  --    RADIOLOGY:  No results found. ASSESSMENT AND  PLAN:   Patient 62 year old African-American female presenting with cough shortness of breath  1.  Sepsis due to multifocal pneumonia Continue Levaquin Blood cultures negative so far.  Obtain sputum cultures if possible Use PRN nebs, Robitussin as needed.  Acute  respiratory failure with hypoxia due to above.  Try to wean off oxygen, NEB PRN.  2.  Diabetes type 2 On sliding scale insulin Decreased Lantus to 30 units at bedtime and discontinued metformin.  3.  Essential hypertension continue Norvasc and lisinopril, triamterene HCTZ and metoprolol  4.  Hyperlipidemia continue therapy with Lipitor Generalized weakness.  PT evaluation. All the records are reviewed and case discussed with Care Management/Social Worker. Management plans discussed with the patient, daughter and they are in agreement.  CODE STATUS: Full Code  TOTAL TIME TAKING CARE OF THIS PATIENT: 32 minutes.   More than 50% of the time was spent in counseling/coordination of care: YES  POSSIBLE D/C IN 2 DAYS, DEPENDING ON CLINICAL CONDITION.   Demetrios Loll M.D on 02/10/2018 at 1:49 PM  Between 7am to 6pm - Pager - [phone]  After 6pm go to [email or web address] - [facility]

## 2018-02-10 NOTE — Progress Notes (Signed)
Patient declined fingerstick and PM insulin dose.

## 2018-02-11 LAB — BASIC METABOLIC PANEL
Anion gap: 8 (ref 5–15)
BUN: 18 mg/dL (ref 6–20)
CALCIUM: 8.7 mg/dL — AB (ref 8.9–10.3)
CHLORIDE: 105 mmol/L (ref 101–111)
CO2: 22 mmol/L (ref 22–32)
CREATININE: 0.75 mg/dL (ref 0.44–1.00)
GFR calc Af Amer: >60 mL/min (ref >60)
GFR calc non Af Amer: >60 mL/min (ref >60)
GLUCOSE: 138 mg/dL — AB (ref 65–99)
Potassium: 4.3 mmol/L (ref 3.5–5.1)
Sodium: 135 mmol/L (ref 135–145)

## 2018-02-11 LAB — CBC
HEMATOCRIT: 28.4 % — AB (ref 35.0–47.0)
HEMOGLOBIN: 9.5 g/dL — AB (ref 12.0–16.0)
MCH: 30.3 pg (ref 26.0–34.0)
MCHC: 33.5 g/dL (ref 32.0–36.0)
MCV: 90.5 fL (ref 80.0–100.0)
Platelets: 560 10*3/uL — ABNORMAL HIGH (ref 150–440)
RBC: 3.13 MIL/uL — ABNORMAL LOW (ref 3.80–5.20)
RDW: 15.5 % — AB (ref 11.5–14.5)
WBC: 11.7 10*3/uL — ABNORMAL HIGH (ref 3.6–11.0)

## 2018-02-11 LAB — MAGNESIUM: Magnesium: 1.8 mg/dL (ref 1.7–2.4)

## 2018-02-11 LAB — GLUCOSE, CAPILLARY
GLUCOSE-CAPILLARY: 123 mg/dL — AB (ref 65–99)
GLUCOSE-CAPILLARY: 228 mg/dL — AB (ref 65–99)

## 2018-02-11 MED ORDER — LEVOFLOXACIN 500 MG PO TABS: 500.0000 mg | ORAL_TABLET | Freq: Every day | ORAL | 0 refills | Status: DC

## 2018-02-11 MED ORDER — ALBUTEROL SULFATE HFA 108 (90 BASE) MCG/ACT IN AERS: 2.0000 | INHALATION_SPRAY | Freq: Four times a day (QID) | RESPIRATORY_TRACT | 2 refills | Status: DC | PRN

## 2018-02-11 MED ORDER — GUAIFENESIN-DM 100-10 MG/5ML PO SYRP: 5.0000 mL | ORAL_SOLUTION | ORAL | 0 refills | Status: DC | PRN

## 2018-02-11 NOTE — Care Management Note (Signed)
Case Management Note  Patient Details  Name: Tamara Parrish MRN: [id number] Date of Birth: Apr 19, 1956   Patient to discharge today.  PT has seen patient and recommends SNF.   Patient declines SNF and wishes to return home.  Patient agreeable to home health services.  At baseline patient lives at home alone.  At discharge she will be staying with her daughter Tamara Parrish at address [address] [place] [address].  RNCM discussed discharge plan with [name] via Phone.  Patient does not have a preference of home health agency.  Referral made to [facility] with [place].  Portable tank and RW to be delivered to room prior to discharge.  [name] is aware of the address patient to discharge to.  RNCM signing off.   Subjective/Objective:                    Action/Plan:   Expected Discharge Date:  02/11/18               Expected Discharge Plan:  [place]  In-House Referral:     Discharge planning Services  CM Consult  Post Acute Care Choice:  Durable Medical Equipment, Home Health Choice offered to:  Patient, Adult Children  DME Arranged:  Oxygen, Walker rolling DME Agency:  [place] Arranged:  RN, PT [facility] Agency:  [place]  Status of Service:  Completed, signed off  If discussed at [place] of Stay Meetings, dates discussed:    Additional Comments:  Beverly Sessions, RN 02/11/2018, 10:34 AM

## 2018-02-11 NOTE — Progress Notes (Signed)
SATURATION QUALIFICATIONS: (This note is used to comply with regulatory documentation for home oxygen)  Patient Saturations on Room Air at Rest =88%   Tried to wean patient off 2L oxygen. 88% on RA. Put on 1L patient at 94%

## 2018-02-11 NOTE — Discharge Summary (Signed)
Tamara Parrish at [place] NAME: Tamara Parrish    MR#:  [id number]  DATE OF BIRTH:  1956-02-23  DATE OF ADMISSION:  02/07/2018   ADMITTING PHYSICIAN: Dustin Flock, MD  DATE OF DISCHARGE: 02/11/2018  PRIMARY CARE PHYSICIAN: Glendon Axe, MD   ADMISSION DIAGNOSIS:  Shortness of breath; N/V/D DISCHARGE DIAGNOSIS:  Active Problems:   PNA (pneumonia)  SECONDARY DIAGNOSIS:   Past Medical History:  Diagnosis Date  . DM2 (diabetes mellitus, type 2) ([place])   . Hyperlipemia   . Hypertension   . Osteomyelitis ([place])   . RA (rheumatoid arthritis) [facility])    HOSPITAL COURSE:  Patient 62 year old African-American female presenting with cough shortness of breath  1.Sepsis due to multifocal pneumonia Continue Levaquin Blood cultures negative so far.  Obtain sputum cultures if possible Use PRN nebs, Robitussin as needed.  Acute respiratory failure with hypoxia due to above.  Tried to wean off oxygen, but the patient still hypoxia on exertion.  She need home oxygen 2 L by nasal cannula with portable tank.  NEB PRN.  2.Diabetes type 2 On sliding scale insulin Decreased Lantus to 30 units at bedtime and discontinued metformin.  Resume home dose after discharge.  3. Essential hypertension continue Norvasc and lisinopril,triamterene HCTZ and metoprolol  4.Hyperlipidemia continue therapy with Lipitor Generalized weakness.  PT evaluation: SNF, but the patient declined. DISCHARGE CONDITIONS:  Stable, discharge to home with home health and PT today. CONSULTS OBTAINED:   DRUG ALLERGIES:   Allergies  Allergen Reactions  . Ibuprofen     Other reaction(s): Other (See Comments) Is not supposed to take because of her kidneys.  . Penicillins     Has patient had a PCN reaction causing immediate rash, facial/tongue/throat swelling, SOB or lightheadedness with hypotension: Unknown Has patient had a PCN reaction causing severe rash involving  mucus membranes or skin necrosis: Unknown Has patient had a PCN reaction that required hospitalization: Unknown Has patient had a PCN reaction occurring within the last 10 years: Unknown If all of the above answers are "NO", then may proceed with Cephalosporin use.   DISCHARGE MEDICATIONS:   Allergies as of 02/11/2018      Reactions   Ibuprofen    Other reaction(s): Other (See Comments) Is not supposed to take because of her kidneys.   Penicillins    Has patient had a PCN reaction causing immediate rash, facial/tongue/throat swelling, SOB or lightheadedness with hypotension: Unknown Has patient had a PCN reaction causing severe rash involving mucus membranes or skin necrosis: Unknown Has patient had a PCN reaction that required hospitalization: Unknown Has patient had a PCN reaction occurring within the last 10 years: Unknown If all of the above answers are "NO", then may proceed with Cephalosporin use.      Medication List    STOP taking these medications   methotrexate 2.5 MG tablet     TAKE these medications   albuterol 108 (90 Base) MCG/ACT inhaler Commonly known as:  PROVENTIL HFA;VENTOLIN HFA Inhale 2 puffs into the lungs every 6 (six) hours as needed for wheezing or shortness of breath.   amLODipine 5 MG tablet Commonly known as:  NORVASC Take 5 mg by mouth daily.   aspirin 81 MG chewable tablet Chew 1 tablet by mouth daily.   atorvastatin 40 MG tablet Commonly known as:  LIPITOR Take 40 mg by mouth daily.   BASAGLAR KWIKPEN 100 UNIT/ML Sopn Inject 36 Units into the skin at bedtime.   folic  acid 1 MG tablet Commonly known as:  FOLVITE Take 1 mg by mouth daily.   guaiFENesin-dextromethorphan 100-10 MG/5ML syrup Commonly known as:  ROBITUSSIN DM Take 5 mLs by mouth every 4 (four) hours as needed for cough.   levofloxacin 500 MG tablet Commonly known as:  LEVAQUIN Take 1 tablet (500 mg total) by mouth daily.   lisinopril 40 MG tablet Commonly known as:   PRINIVIL,ZESTRIL Take 40 mg by mouth daily.   metFORMIN 1000 MG tablet Commonly known as:  GLUCOPHAGE Take 1,000 mg by mouth 2 (two) times daily.   metoprolol tartrate 25 MG tablet Commonly known as:  LOPRESSOR Take 25 mg by mouth 2 (two) times daily.   triamterene-hydrochlorothiazide 37.5-25 MG capsule Commonly known as:  DYAZIDE Take 1 capsule by mouth daily.            Durable Medical Equipment  (From admission, onward)        Start     Ordered   02/11/18 1006  For home use only DME oxygen  Once    Question Answer Comment  Mode or (Route) Nasal cannula   Liters per Minute 2   Frequency Continuous (stationary and portable oxygen unit needed)   Oxygen conserving device Yes   Oxygen delivery system Gas      02/11/18 1006   02/11/18 1006  For home use only DME Walker rolling  Once    Question:  Patient needs a walker to treat with the following condition  Answer:  Weakness generalized   02/11/18 1006       DISCHARGE INSTRUCTIONS:  See AVS.  If you experience worsening of your admission symptoms, develop shortness of breath, life threatening emergency, suicidal or homicidal thoughts you must seek medical attention immediately by calling 911 or calling your MD immediately  if symptoms less severe.  You Must read complete instructions/literature along with all the possible adverse reactions/side effects for all the Medicines you take and that have been prescribed to you. Take any new Medicines after you have completely understood and accpet all the possible adverse reactions/side effects.   Please note  You were cared for by a hospitalist during your hospital stay. If you have any questions about your discharge medications or the care you received while you were in the hospital after you are discharged, you can call the unit and asked to speak with the hospitalist on call if the hospitalist that took care of you is not available. Once you are discharged, your primary  care physician will handle any further medical issues. Please note that NO REFILLS for any discharge medications will be authorized once you are discharged, as it is imperative that you return to your primary care physician (or establish a relationship with a primary care physician if you do not have one) for your aftercare needs so that they can reassess your need for medications and monitor your lab values.    On the day of Discharge:  VITAL SIGNS:  Blood pressure (!) 146/69, pulse 86, temperature 98.2 F (36.8 C), temperature source Oral, resp. rate 20, height 5' (1.524 m), weight 183 lb (83 kg), SpO2 93 %. PHYSICAL EXAMINATION:  GENERAL:  62 y.o.-year-old patient lying in the bed with no acute distress.  EYES: Pupils equal, round, reactive to light and accommodation. No scleral icterus. Extraocular muscles intact.  HEENT: Head atraumatic, normocephalic. Oropharynx and nasopharynx clear.  NECK:  Supple, no jugular venous distention. No thyroid enlargement, no tenderness.  LUNGS: Normal breath sounds  bilaterally, no wheezing, mild crackles. No use of accessory muscles of respiration.  CARDIOVASCULAR: S1, S2 normal. No murmurs, rubs, or gallops.  ABDOMEN: Soft, non-tender, non-distended. Bowel sounds present. No organomegaly or mass.  EXTREMITIES: No pedal edema, cyanosis, or clubbing.  NEUROLOGIC: Cranial nerves II through XII are intact. Muscle strength 5/5 in all extremities. Sensation intact. Gait not checked.  PSYCHIATRIC: The patient is alert and oriented x 3.  SKIN: No obvious rash, lesion, or ulcer.  DATA REVIEW:   CBC Recent Labs  Lab 02/11/18 0726  WBC 11.7*  HGB 9.5*  HCT 28.4*  PLT 560*    Chemistries  Recent Labs  Lab 02/07/18 1848  02/11/18 0726  NA 133*   < > 135  K 4.1   < > 4.3  CL 102   < > 105  CO2 20*   < > 22  GLUCOSE 194*   < > 138*  BUN 29*   < > 18  CREATININE 1.09*   < > 0.75  CALCIUM 8.6*   < > 8.7*  MG  --   --  1.8  AST 25  --   --   ALT 11*   --   --   ALKPHOS 104  --   --   BILITOT 0.3  --   --    < > = values in this interval not displayed.     Microbiology Results  Results for orders placed or performed during the hospital encounter of 02/07/18  Urine culture     Status: Abnormal   Collection Time: 02/07/18  7:55 PM  Result Value Ref Range Status   Specimen Description   Final    URINE, RANDOM Performed at [facility], [address], [place], [place] [address]    Special Requests   Final    NONE Performed at [facility], [address] [place], [place] [address]    Culture MULTIPLE SPECIES PRESENT, SUGGEST RECOLLECTION (A)  Final   Report Status 02/09/2018 FINAL  Final  MRSA PCR Screening     Status: None   Collection Time: 02/07/18  8:04 PM  Result Value Ref Range Status   MRSA by PCR NEGATIVE NEGATIVE Final    Comment:        The GeneXpert MRSA Assay (FDA approved for NASAL specimens only), is one component of a comprehensive MRSA colonization surveillance program. It is not intended to diagnose MRSA infection nor to guide or monitor treatment for MRSA infections. Performed at [facility], [address] [place], [place] [address]   Blood Culture (routine x 2)     Status: None (Preliminary result)   Collection Time: 02/07/18  8:17 PM  Result Value Ref Range Status   Specimen Description BLOOD RIGHT ANTECUBITAL  Final   Special Requests   Final    BOTTLES DRAWN AEROBIC AND ANAEROBIC Blood Culture adequate volume   Culture   Final    NO GROWTH 4 DAYS Performed at [facility], [address] [place], [place] [address]    Report Status PENDING  Incomplete  Blood Culture (routine x 2)     Status: None (Preliminary result)   Collection Time: 02/07/18  8:17 PM  Result Value Ref Range Status   Specimen Description BLOOD BLOOD LEFT HAND  Final   Special Requests   Final    BOTTLES DRAWN AEROBIC AND ANAEROBIC Blood Culture adequate volume   Culture   Final      NO  GROWTH 4 DAYS Performed at [facility], [address] [place], [place] [address]    Report Status PENDING  Incomplete    RADIOLOGY:  No results found.   Management plans discussed with the patient, family and they are in agreement.  CODE STATUS: Full Code   TOTAL TIME TAKING CARE OF THIS PATIENT: 35 minutes.    Demetrios Loll M.D on 02/11/2018 at 1:31 PM  Between 7am to 6pm - Pager - [phone]  After 6pm go to [email or web address] - Proofreader  [facility]  Office  [phone]  CC: Primary care physician; Glendon Axe, MD   Note: This dictation was prepared with Dragon dictation along with smaller phrase technology. Any transcriptional errors that result from this process are unintentional.

## 2018-02-11 NOTE — Clinical Social Work Note (Signed)
Clinical Social Work Assessment  Patient Details  Name: Tamara Parrish MRN: [id number] Date of Birth: [birth year]  Date of referral:  02/11/18               Reason for consult:  Discharge Planning                Permission sought to share information with:  Case Manager Permission granted to share information::  Yes, Verbal Permission Granted  Name::        Agency::     Relationship::     Contact Information:     Housing/Transportation Living arrangements for the past 2 months:  Single Family Home Source of Information:  Patient Patient Interpreter Needed:  None Criminal Activity/Legal Involvement Pertinent to Current Situation/Hospitalization:  No - Comment as needed Significant Relationships:  Adult Children Lives with:  Self Do you feel safe going back to the place where you live?  Yes Need for family participation in patient care:  No (Coment)  Care giving concerns:  Patient resides at home alone.   Social Worker assessment / plan:  CSW noted that patient was seen by PT and they have recommended short term rehab for patient. CSW spoke with patient this morning and patient immediately declined short term rehab in a facility. She states she has a daughter in [place] and a daughter in [place]. She is considering staying with the one in [place] at discharge. Patient's affect was somewhat flat and her responses were slow. Patient does not have a history of mental illness but is currently on xanax for anxiety while in the hospital. Patient confirms she is somewhat frustrated with how she has been medically and she is also concerned about financial resources for hospital bill. CSW provided supportive counseling and discussed the possibility of contacting financial department to discuss options for payment, etc for hospital bill.  Employment status:  Psychologist, counselling:    PT Recommendations:  [place] / Referral to community resources:      Patient/Family's Response to care:  Patient expressed appreciation for CSW assistance.  Patient/Family's Understanding of and Emotional Response to Diagnosis, Current Treatment, and Prognosis:  Patient is somewhat flat in her responses. Currently appears to be a situational response to current stressors.   Emotional Assessment Appearance:  Appears stated age Attitude/Demeanor/Rapport:  (pleasant and cooperative) Affect (typically observed):  Flat Orientation:  Oriented to Self, Oriented to Place, Oriented to  Time, Oriented to Situation Alcohol / Substance use:  Not Applicable Psych involvement (Current and /or in the community):  No (Comment)  Discharge Needs  Concerns to be addressed:  Care Coordination Readmission within the last 30 days:  No Current discharge risk:  None Barriers to Discharge:  Continued Medical Work up   [employer], LCSW 02/11/2018, 9:52 AM

## 2018-02-12 LAB — CULTURE, BLOOD (ROUTINE X 2)
CULTURE: NO GROWTH
CULTURE: NO GROWTH
SPECIAL REQUESTS: ADEQUATE
Special Requests: ADEQUATE

## 2018-02-21 ENCOUNTER — Emergency Department (HOSPITAL_COMMUNITY)
Admission: EM | Admit: 2018-02-21 | Discharge: 2018-02-21 | Disposition: A | Discharge status: Home / Self Care | Payer: BLUE CROSS/BLUE SHIELD | Attending: Emergency Medicine | Admitting: Emergency Medicine

## 2018-02-21 ENCOUNTER — Inpatient Hospital Stay
Admission: EM | Admit: 2018-02-21 | Discharge: 2018-02-24 | DRG: 194 | Disposition: A | Discharge status: Home / Self Care | Payer: BLUE CROSS/BLUE SHIELD | Attending: Internal Medicine | Admitting: Internal Medicine

## 2018-02-21 ENCOUNTER — Inpatient Hospital Stay: Payer: BLUE CROSS/BLUE SHIELD

## 2018-02-21 ENCOUNTER — Encounter: Payer: Self-pay | Admitting: Emergency Medicine

## 2018-02-21 ENCOUNTER — Encounter (HOSPITAL_COMMUNITY): Payer: Self-pay | Admitting: Emergency Medicine

## 2018-02-21 ENCOUNTER — Other Ambulatory Visit: Payer: Self-pay

## 2018-02-21 ENCOUNTER — Emergency Department: Payer: BLUE CROSS/BLUE SHIELD

## 2018-02-21 DIAGNOSIS — Z87891 Personal history of nicotine dependence: Secondary | ICD-10-CM

## 2018-02-21 DIAGNOSIS — I10 Essential (primary) hypertension: Secondary | ICD-10-CM | POA: Diagnosis present

## 2018-02-21 DIAGNOSIS — J189 Pneumonia, unspecified organism: Secondary | ICD-10-CM | POA: Diagnosis present

## 2018-02-21 DIAGNOSIS — Y95 Nosocomial condition: Secondary | ICD-10-CM | POA: Diagnosis present

## 2018-02-21 DIAGNOSIS — Z833 Family history of diabetes mellitus: Secondary | ICD-10-CM

## 2018-02-21 DIAGNOSIS — Z880 Allergy status to penicillin: Secondary | ICD-10-CM | POA: Diagnosis not present

## 2018-02-21 DIAGNOSIS — Z7984 Long term (current) use of oral hypoglycemic drugs: Secondary | ICD-10-CM | POA: Diagnosis not present

## 2018-02-21 DIAGNOSIS — J441 Chronic obstructive pulmonary disease with (acute) exacerbation: Secondary | ICD-10-CM | POA: Diagnosis present

## 2018-02-21 DIAGNOSIS — R918 Other nonspecific abnormal finding of lung field: Secondary | ICD-10-CM | POA: Diagnosis present

## 2018-02-21 DIAGNOSIS — Z7982 Long term (current) use of aspirin: Secondary | ICD-10-CM | POA: Diagnosis not present

## 2018-02-21 DIAGNOSIS — Z794 Long term (current) use of insulin: Secondary | ICD-10-CM

## 2018-02-21 DIAGNOSIS — J8410 Pulmonary fibrosis, unspecified: Secondary | ICD-10-CM | POA: Diagnosis present

## 2018-02-21 DIAGNOSIS — R0602 Shortness of breath: Secondary | ICD-10-CM | POA: Diagnosis present

## 2018-02-21 DIAGNOSIS — Z8701 Personal history of pneumonia (recurrent): Secondary | ICD-10-CM

## 2018-02-21 DIAGNOSIS — Z886 Allergy status to analgesic agent status: Secondary | ICD-10-CM

## 2018-02-21 DIAGNOSIS — J440 Chronic obstructive pulmonary disease with acute lower respiratory infection: Secondary | ICD-10-CM | POA: Diagnosis present

## 2018-02-21 DIAGNOSIS — E785 Hyperlipidemia, unspecified: Secondary | ICD-10-CM | POA: Diagnosis present

## 2018-02-21 DIAGNOSIS — E119 Type 2 diabetes mellitus without complications: Secondary | ICD-10-CM | POA: Diagnosis present

## 2018-02-21 DIAGNOSIS — Z9981 Dependence on supplemental oxygen: Secondary | ICD-10-CM

## 2018-02-21 DIAGNOSIS — M069 Rheumatoid arthritis, unspecified: Secondary | ICD-10-CM | POA: Diagnosis present

## 2018-02-21 DIAGNOSIS — Z789 Other specified health status: Secondary | ICD-10-CM

## 2018-02-21 LAB — COMPREHENSIVE METABOLIC PANEL
ALBUMIN: 2.5 g/dL — AB (ref 3.5–5.0)
ALK PHOS: 83 U/L (ref 38–126)
ALT: 6 U/L (ref 0–44)
AST: 20 U/L (ref 15–41)
Anion gap: 9 (ref 5–15)
BILIRUBIN TOTAL: 0.6 mg/dL (ref 0.3–1.2)
BUN: 23 mg/dL (ref 8–23)
CALCIUM: 9.1 mg/dL (ref 8.9–10.3)
CO2: 25 mmol/L (ref 22–32)
Chloride: 101 mmol/L (ref 98–111)
Creatinine, Ser: 1.06 mg/dL — ABNORMAL HIGH (ref 0.44–1.00)
GFR calc Af Amer: >60 mL/min (ref >60)
GFR calc non Af Amer: 55 mL/min — ABNORMAL LOW (ref >60)
GLUCOSE: 198 mg/dL — AB (ref 70–99)
Potassium: 4.6 mmol/L (ref 3.5–5.1)
Sodium: 135 mmol/L (ref 135–145)
TOTAL PROTEIN: 8.3 g/dL — AB (ref 6.5–8.1)

## 2018-02-21 LAB — CBC WITH DIFFERENTIAL/PLATELET
BASOS ABS: 0.2 10*3/uL — AB (ref 0–0.1)
BASOS PCT: 1 %
EOS ABS: 0.6 10*3/uL (ref 0–0.7)
Eosinophils Relative: 4 %
HCT: 31.2 % — ABNORMAL LOW (ref 35.0–47.0)
Hemoglobin: 10.2 g/dL — ABNORMAL LOW (ref 12.0–16.0)
LYMPHS PCT: 26 %
Lymphs Abs: 3.7 10*3/uL — ABNORMAL HIGH (ref 1.0–3.6)
MCH: 29.8 pg (ref 26.0–34.0)
MCHC: 32.6 g/dL (ref 32.0–36.0)
MCV: 91.5 fL (ref 80.0–100.0)
MONO ABS: 1.1 10*3/uL — AB (ref 0.2–0.9)
Monocytes Relative: 8 %
Neutro Abs: 8.4 10*3/uL — ABNORMAL HIGH (ref 1.4–6.5)
Neutrophils Relative %: 61 %
PLATELETS: 575 10*3/uL — AB (ref 150–440)
RBC: 3.41 MIL/uL — ABNORMAL LOW (ref 3.80–5.20)
RDW: 16.2 % — AB (ref 11.5–14.5)
WBC: 14 10*3/uL — ABNORMAL HIGH (ref 3.6–11.0)

## 2018-02-21 LAB — GLUCOSE, CAPILLARY: GLUCOSE-CAPILLARY: 143 mg/dL — AB (ref 70–99)

## 2018-02-21 LAB — MRSA PCR SCREENING: MRSA by PCR: NEGATIVE

## 2018-02-21 LAB — LACTIC ACID, PLASMA: Lactic Acid, Venous: 1.9 mmol/L (ref 0.5–1.9)

## 2018-02-21 MED ORDER — ALPRAZOLAM 0.25 MG PO TABS
0.2500 mg | ORAL_TABLET | Freq: Two times a day (BID) | ORAL | Status: DC | PRN
  Administered 2018-02-23: 0.25 mg via ORAL
  Filled 2018-02-21: qty 1

## 2018-02-21 MED ORDER — INSULIN GLARGINE 100 UNIT/ML ~~LOC~~ SOLN
36.0000 [IU] | Freq: Every day | SUBCUTANEOUS | Status: DC
  Filled 2018-02-21 (×2): qty 0.36

## 2018-02-21 MED ORDER — ACETAMINOPHEN 325 MG PO TABS: 650.0000 mg | ORAL_TABLET | Freq: Four times a day (QID) | ORAL | Status: DC | PRN

## 2018-02-21 MED ORDER — ENOXAPARIN SODIUM 40 MG/0.4ML ~~LOC~~ SOLN
40.0000 mg | SUBCUTANEOUS | Status: DC
  Administered 2018-02-21 – 2018-02-23 (×3): 40 mg via SUBCUTANEOUS
  Filled 2018-02-21 (×3): qty 0.4

## 2018-02-21 MED ORDER — ATORVASTATIN CALCIUM 20 MG PO TABS
40.0000 mg | ORAL_TABLET | Freq: Every day | ORAL | Status: DC
  Administered 2018-02-22 – 2018-02-24 (×3): 40 mg via ORAL
  Filled 2018-02-21 (×3): qty 2

## 2018-02-21 MED ORDER — FOLIC ACID 1 MG PO TABS
1.0000 mg | ORAL_TABLET | Freq: Every day | ORAL | Status: DC
  Administered 2018-02-22 – 2018-02-24 (×3): 1 mg via ORAL
  Filled 2018-02-21 (×3): qty 1

## 2018-02-21 MED ORDER — SODIUM CHLORIDE 0.9 % IV SOLN
2.0000 g | Freq: Once | INTRAVENOUS | Status: AC
  Administered 2018-02-21: 2 g via INTRAVENOUS
  Filled 2018-02-21: qty 2

## 2018-02-21 MED ORDER — LISINOPRIL 20 MG PO TABS
40.0000 mg | ORAL_TABLET | Freq: Every day | ORAL | Status: DC
  Administered 2018-02-22 – 2018-02-24 (×2): 40 mg via ORAL
  Filled 2018-02-21 (×3): qty 2

## 2018-02-21 MED ORDER — TRIAMTERENE-HCTZ 37.5-25 MG PO TABS
1.0000 | ORAL_TABLET | Freq: Every day | ORAL | Status: DC
  Administered 2018-02-22 – 2018-02-24 (×2): 1 via ORAL
  Filled 2018-02-21 (×3): qty 1

## 2018-02-21 MED ORDER — ACETAMINOPHEN 650 MG RE SUPP: 650.0000 mg | Freq: Four times a day (QID) | RECTAL | Status: DC | PRN

## 2018-02-21 MED ORDER — ONDANSETRON HCL 4 MG/2ML IJ SOLN
4.0000 mg | Freq: Four times a day (QID) | INTRAMUSCULAR | Status: DC | PRN
Start: 2018-02-21 — End: 2018-02-24

## 2018-02-21 MED ORDER — IOHEXOL 300 MG/ML  SOLN
75.0000 mL | Freq: Once | INTRAMUSCULAR | Status: AC | PRN
  Administered 2018-02-21: 75 mL via INTRAVENOUS

## 2018-02-21 MED ORDER — INSULIN ASPART 100 UNIT/ML ~~LOC~~ SOLN
0.0000 [IU] | Freq: Three times a day (TID) | SUBCUTANEOUS | Status: DC
  Administered 2018-02-22: 1 [IU] via SUBCUTANEOUS
  Administered 2018-02-22: 3 [IU] via SUBCUTANEOUS
  Administered 2018-02-23: 2 [IU] via SUBCUTANEOUS
  Administered 2018-02-23: 1 [IU] via SUBCUTANEOUS
  Filled 2018-02-21 (×4): qty 1

## 2018-02-21 MED ORDER — INSULIN ASPART 100 UNIT/ML ~~LOC~~ SOLN
0.0000 [IU] | Freq: Every day | SUBCUTANEOUS | Status: DC
  Administered 2018-02-23: 2 [IU] via SUBCUTANEOUS
  Filled 2018-02-21: qty 1

## 2018-02-21 MED ORDER — AMLODIPINE BESYLATE 5 MG PO TABS
5.0000 mg | ORAL_TABLET | Freq: Every day | ORAL | Status: DC
  Administered 2018-02-22 – 2018-02-24 (×2): 5 mg via ORAL
  Filled 2018-02-21 (×3): qty 1

## 2018-02-21 MED ORDER — METOPROLOL TARTRATE 25 MG PO TABS
25.0000 mg | ORAL_TABLET | Freq: Two times a day (BID) | ORAL | Status: DC
  Administered 2018-02-21 – 2018-02-24 (×5): 25 mg via ORAL
  Filled 2018-02-21 (×6): qty 1

## 2018-02-21 MED ORDER — ASPIRIN 81 MG PO CHEW
81.0000 mg | CHEWABLE_TABLET | Freq: Every day | ORAL | Status: DC
  Administered 2018-02-22 – 2018-02-24 (×3): 81 mg via ORAL
  Filled 2018-02-21 (×3): qty 1

## 2018-02-21 MED ORDER — SODIUM CHLORIDE 0.9 % IV SOLN
1.0000 g | Freq: Three times a day (TID) | INTRAVENOUS | Status: DC
Start: 2018-02-21 — End: 2018-02-24
  Administered 2018-02-21 – 2018-02-24 (×8): 1 g via INTRAVENOUS
  Filled 2018-02-21 (×10): qty 1

## 2018-02-21 MED ORDER — VANCOMYCIN HCL IN DEXTROSE 1-5 GM/200ML-% IV SOLN
1000.0000 mg | Freq: Once | INTRAVENOUS | Status: AC
  Administered 2018-02-21: 1000 mg via INTRAVENOUS
  Filled 2018-02-21: qty 200

## 2018-02-21 MED ORDER — VANCOMYCIN HCL IN DEXTROSE 1-5 GM/200ML-% IV SOLN
1000.0000 mg | Freq: Two times a day (BID) | INTRAVENOUS | Status: DC
  Administered 2018-02-22: 1000 mg via INTRAVENOUS
  Filled 2018-02-21 (×3): qty 200

## 2018-02-21 MED ORDER — ONDANSETRON HCL 4 MG PO TABS: 4.0000 mg | ORAL_TABLET | Freq: Four times a day (QID) | ORAL | Status: DC | PRN

## 2018-02-21 NOTE — ED Triage Notes (Signed)
Patient told to go to ED by PCP after repeat bloodwork and CXR shows persistent pneumonia. Patient was admitted about a month ago for same and discharged after a week. Patient's daughter reports she looks and feels better, but her WBC remains elevated and PCP didn't think oral antibiotics would be enough. Results from this morning may be viewed in [facility]. Patient reports SOB that is the same over the past month, denies CP, no fevers/chills.

## 2018-02-21 NOTE — ED Notes (Addendum)
Pt's daughter to the desk stating that she wanted to take her mother to [facility] since that is where she is normally seen. Family advised to stay and be seen.

## 2018-02-21 NOTE — Consult Note (Signed)
Pharmacy Antibiotic Note  Tamara Parrish is a 62 y.o. female admitted on 02/21/2018 with pneumonia.  Pharmacy has been consulted for Meropenem and Vancomycin dosing. Patient received vancomycin 1g IV x 1 dose and aztreonam 2g x 1 dose in ED.   Plan: Ke: 0.072    T1/2: 9.63   VD: 47.6  DW: 68kg  Start Vancomycin 1000 IV every 12 hours with 6 hour stack dosing.  Goal trough 15-20 mcg/mL. Calculated trough at Css is 17. Trough level prior to 4th dose. MRSA PCR is ordered- if negative recommend discontinuation of vancomycin.  Start meropenem 1g IV every 8 hours.   Height: 5\' 5"  (165.1 cm) Weight: 185 lb (83.9 kg) IBW/kg (Calculated) : 57  Temp (24hrs), Avg:98.4 F (36.9 C), Min:98.3 F (36.8 C), Max:98.5 F (36.9 C)  Recent Labs  Lab 02/21/18 1835  WBC 14.0*    Estimated Creatinine Clearance: 77 mL/min (by C-G formula based on SCr of 0.75 mg/dL).    Allergies  Allergen Reactions  . Ibuprofen     Other reaction(s): Other (See Comments) Is not supposed to take because of her kidneys.  . Penicillins     Has patient had a PCN reaction causing immediate rash, facial/tongue/throat swelling, SOB or lightheadedness with hypotension: Unknown Has patient had a PCN reaction causing severe rash involving mucus membranes or skin necrosis: Unknown Has patient had a PCN reaction that required hospitalization: Unknown Has patient had a PCN reaction occurring within the last 10 years: Unknown If all of the above answers are "NO", then may proceed with Cephalosporin use.    Antimicrobials this admission: 7/1 meropenem >>  7/1 vancomycin  >>   Microbiology results: 7/1 BCx: pending  7/1 MRSA PCR: pending   Thank you for allowing pharmacy to be a part of this patient's care.  Pernell Dupre, PharmD, BCPS Clinical Pharmacist 02/21/2018 7:07 PM

## 2018-02-21 NOTE — ED Provider Notes (Signed)
[facility] Emergency Department Provider Note   ____________________________________________   First MD Initiated Contact with Patient 02/21/18 1805     (approximate)  I have reviewed the triage vital signs and the nursing notes.   HISTORY  Chief Complaint Shortness of Breath    HPI Tamara Parrish is a 62 y.o. female here for evaluation of shortness of breath  Patient reports she was discharged with pneumonia about 2 weeks ago, saw her primary doctor today because of shortness of breath and fatigue and ongoing productive cough had an x-ray and they were told her she had ongoing pneumonia, check lab work and told her she need to come to the emergency department for admission.  She reports shortness of breath, fatigue.  Ongoing productive cough.  Occasionally reports she will cough so hard that she will spit up.  She reports she is having no pain.  No chest pain.  No fevers or chills, but reports she feels like she is getting worse again.  Reports she completed a course of Levaquin  Past Medical History:  Diagnosis Date  . DM2 (diabetes mellitus, type 2) ([place])   . Hyperlipemia   . Hypertension   . Osteomyelitis ([place])   . RA (rheumatoid arthritis) [employer])     Patient Active Problem List   Diagnosis Date Noted  . PNA (pneumonia) 02/07/2018    Past Surgical History:  Procedure Laterality Date  . 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 Authorizing Provider  albuterol (PROVENTIL HFA;VENTOLIN HFA) 108 (90 Base) MCG/ACT inhaler Inhale 2 puffs into the lungs every 6 (six) hours as needed for wheezing or shortness of breath. 02/11/18   Demetrios Loll, MD  amLODipine (NORVASC) 5 MG tablet Take 5 mg by mouth daily. 01/20/18   [provider]  aspirin 81 MG chewable tablet Chew 1 tablet by mouth daily.    [provider]  atorvastatin (LIPITOR) 40 MG tablet Take 40 mg by  mouth daily. 01/20/18   [provider]  folic acid (FOLVITE) 1 MG tablet Take 1 mg by mouth daily. 01/20/18   [provider]  guaiFENesin-dextromethorphan (ROBITUSSIN DM) 100-10 MG/5ML syrup Take 5 mLs by mouth every 4 (four) hours as needed for cough. 02/11/18   Demetrios Loll, MD  Insulin Glargine [facility]) 100 UNIT/ML SOPN Inject 36 Units into the skin at bedtime. 01/04/18   [provider]  levofloxacin (LEVAQUIN) 500 MG tablet Take 1 tablet (500 mg total) by mouth daily. 02/11/18   Demetrios Loll, MD  lisinopril (PRINIVIL,ZESTRIL) 40 MG tablet Take 40 mg by mouth daily. 01/20/18   [provider]  metFORMIN (GLUCOPHAGE) 1000 MG tablet Take 1,000 mg by mouth 2 (two) times daily. 01/20/18   [provider]  metoprolol tartrate (LOPRESSOR) 25 MG tablet Take 25 mg by mouth 2 (two) times daily. 01/20/18   [provider]  triamterene-hydrochlorothiazide (DYAZIDE) 37.5-25 MG capsule Take 1 capsule by mouth daily. 01/20/18   [provider]    Allergies Ibuprofen and Penicillins  Family History  Problem Relation Age of Onset  . Diabetes Mother     Social History Social History   Tobacco Use  . Smoking status: Former Research scientist (life sciences)  . Smokeless tobacco: Never Used  Substance Use Topics  . Alcohol use: Never    Frequency: Never  . Drug use: Not on file    Review of Systems Constitutional: No  fever/chills but reports fatigue Eyes: No visual changes. ENT: No sore throat. Cardiovascular: Denies chest pain. Respiratory: See HPI  gastrointestinal: No abdominal pain.  No diarrhea.  No constipation. Genitourinary: Negative for dysuria. Musculoskeletal: Negative for back pain. Skin: Negative for rash. Neurological: Negative for headaches, focal weakness or numbness.    ____________________________________________   PHYSICAL EXAM:  VITAL SIGNS: ED Triage Vitals  Enc Vitals Group     BP 02/21/18 1748 136/74     Pulse Rate  02/21/18 1748 95     Resp 02/21/18 1748 18     Temp 02/21/18 1748 98.3 F (36.8 C)     Temp Source 02/21/18 1748 Oral     SpO2 02/21/18 1748 (!) 88 %     Weight 02/21/18 1749 185 lb (83.9 kg)     Height 02/21/18 1749 5\' 5"  (1.651 m)     Head Circumference --      Peak Flow --      Pain Score --      Pain Loc --      Pain Edu? --      Excl. in [place]? --     Constitutional: Alert and oriented.  Mildly ill-appearing but in no distress.  Very pleasant with daughter at bedside. Eyes: Conjunctivae are normal. Head: Atraumatic. Nose: No congestion/rhinnorhea. Mouth/Throat: Mucous membranes are moist. Neck: No stridor.   Cardiovascular: Normal rate, regular rhythm. Grossly normal heart sounds.  Good peripheral circulation. Respiratory: Mild increased work of breathing.  A frequent productive cough is heard.  Central rhonchi, right lung primarily clear without any wheezing or rales noted.  Left lung with frequent rales, wet crackles throughout and no wheezing. Gastrointestinal: Soft and nontender. No distention. Musculoskeletal: No lower extremity tenderness nor edema. Neurologic:  Normal speech and language. No gross focal neurologic deficits are appreciated.  Skin:  Skin is warm, dry and intact. No rash noted. Psychiatric: Mood and affect are normal. Speech and behavior are normal.  ____________________________________________   LABS (all labs ordered are listed, but only abnormal results are displayed)  Labs Reviewed  CULTURE, BLOOD (ROUTINE X 2)  CULTURE, BLOOD (ROUTINE X 2)  LACTIC ACID, PLASMA  LACTIC ACID, PLASMA  COMPREHENSIVE METABOLIC PANEL  CBC WITH DIFFERENTIAL/PLATELET  URINALYSIS, COMPLETE (UACMP) WITH MICROSCOPIC   ____________________________________________  EKG   ____________________________________________  RADIOLOGY  Dg Chest 1 View  Result Date: 02/21/2018 CLINICAL DATA:  Recent diagnosis of pneumonia. Continued elevation of white blood cell count. EXAM:  CHEST  1 VIEW COMPARISON:  Single-view of the chest 02/12/2014. PA and lateral chest 02/07/2018. FINDINGS: Dense peripheral opacity in the left upper lobe does not appear changed. The lungs are emphysematous with scattered scarring. Airspace disease is present the left lung base. Heart size is normal. Aortic atherosclerosis is seen. No acute bony abnormality. IMPRESSION: No notable change in left upper lobe airspace opacity and left basilar airspace disease. The findings could be due to pneumonia but are worrisome for carcinoma. Chest CT with contrast is recommended for further evaluation. Electronically Signed   By: Inge Rise M.D.   On: 02/21/2018 18:22   ____________________________________________   PROCEDURES  Procedure(s) performed: None  Procedures  Critical Care performed: Yes, see critical care note(s)  CRITICAL CARE Performed by: Delman Kitten   Total critical care time: 35 minutes  Critical care time was exclusive of separately billable procedures and treating other patients.  Critical care was necessary to treat or prevent imminent or life-threatening deterioration.  Critical care was time spent personally  by me on the following activities: development of treatment plan with patient and/or surrogate as well as nursing, discussions with consultants, evaluation of patient's response to treatment, examination of patient, obtaining history from patient or surrogate, ordering and performing treatments and interventions, ordering and review of laboratory studies, ordering and review of radiographic studies, pulse oximetry and re-evaluation of patient's condition.  Patient had elevated risk for decompensation, currently meeting criteria for sepsis.  No signs or symptoms of severe sepsis or septic shock at this time, but will initiate broad-spectrum antibiotics for concerns of failed outpatient therapy.  Await lactic acid. ____________________________________________   INITIAL  IMPRESSION / ASSESSMENT AND PLAN / ED COURSE  Pertinent labs & imaging results that were available during my care of the patient were reviewed by me and considered in my medical decision making (see chart for details).  Patient presents from her 27 office reporting she was instructed she to be admitted for ongoing pneumonia.  On my exam, appears that she likely has worsening and recurrence of a multifocal pneumonia.  Previously treated with Levaquin, but given worsening I will broaden spectrum including aztreonam and vancomycin per our sepsis treatment recommendations for healthcare associated pneumonia.  Denies cardiac symptoms.  And some hypoxia but corrects on nasal cannula.  Primary care doctor's office demonstrates white count of greater than 15,000, and given her associated symptoms suspect ongoing sepsis heart rate greater than 99.  Await repeat CBC, lactic acid.  Broad-spectrum antibiotics.  Discussed with hospitalist service, will admit.  Dr. Tressia Miners      ____________________________________________   FINAL CLINICAL IMPRESSION(S) / ED DIAGNOSES  Final diagnoses:  HCAP (healthcare-associated pneumonia)  Failure of outpatient treatment      NEW MEDICATIONS STARTED DURING THIS VISIT:  New Prescriptions   No medications on file     Note:  This document was prepared using Dragon voice recognition software and may include unintentional dictation errors.     Delman Kitten, MD 02/22/18 [phone]

## 2018-02-21 NOTE — H&P (Addendum)
[facility] [place] NAME: Tamara Parrish    MR#:  [id number]  DATE OF BIRTH:  [birth year]  DATE OF ADMISSION:  02/21/2018  PRIMARY CARE PHYSICIAN: Glendon Axe, MD   REQUESTING/REFERRING PHYSICIAN: Dr. Delman Kitten  CHIEF COMPLAINT:   Chief Complaint  Patient presents with  . Shortness of Breath    HISTORY OF PRESENT ILLNESS:  Tamara Parrish  is a 62 y.o. female with a known history of hypertension, hyperlipidemia, IDDM, rheumatoid arthritis who was admitted to the hospital for community-acquired pneumonia 2 weeks ago was discharged with home oxygen and was sent back from PCPs office due to worsening hypoxia and x-ray findings. Patient needed oxygen at the time of discharge.  She qualified with her diagnosis of COPD.  She just quit smoking about 2 weeks ago.  She was discharged on Levaquin initially her cough was getting better, repeat follow-up about a week ago showing elevated white count still.  She was walking with home health physical therapy with the help of her walker.  Last couple of days her shortness of breath all fours.  Daughter took her to see her PCP again, chest x-ray showing worsening of her pneumonia and elevated white count.  So she was sent to the hospital.  Patient denies any fevers or chills.  Complains of nausea and vomiting yesterday.  No other sick contacts.  PAST MEDICAL HISTORY:   Past Medical History:  Diagnosis Date  . DM2 (diabetes mellitus, type 2) ([place])   . Hyperlipemia   . Hypertension   . Osteomyelitis ([place])   . RA (rheumatoid arthritis) ([place])     PAST SURGICAL HISTORY:   Past Surgical History:  Procedure Laterality Date  . 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 Smoking status: Former Smoker    Last attempt to quit: 02/08/2018    Years since quitting: 0.0  . Smokeless tobacco: Never Used  . Tobacco comment: quit 2 weeks ago  Substance Use Topics  .  Alcohol use: Never    Frequency: Never    FAMILY HISTORY:   Family History  Problem Relation Age of Onset  . Diabetes Mother     DRUG ALLERGIES:   Allergies  Allergen Reactions  . Ibuprofen     Other reaction(s): Other (See Comments) Is not supposed to take because of her kidneys.  . Penicillins     Has patient had a PCN reaction causing immediate rash, facial/tongue/throat swelling, SOB or lightheadedness with hypotension: Unknown Has patient had a PCN reaction causing severe rash involving mucus membranes or skin necrosis: Unknown Has patient had a PCN reaction that required hospitalization: Unknown Has patient had a PCN reaction occurring within the last 10 years: Unknown If all of the above answers are "NO", then may proceed with Cephalosporin use.    REVIEW OF SYSTEMS:   Review of Systems  Constitutional: Positive for malaise/fatigue. Negative for chills, fever and weight loss.  HENT: Negative for ear discharge, ear pain, hearing loss, nosebleeds and tinnitus.   Eyes: Negative for blurred vision, double vision and photophobia.  Respiratory: Positive for cough and shortness of breath. Negative for hemoptysis and wheezing.   Cardiovascular: Negative for chest pain, palpitations, orthopnea and leg swelling.  Gastrointestinal: Negative for abdominal pain, constipation, diarrhea, heartburn, melena, nausea and vomiting.  Genitourinary: Negative for dysuria, frequency and urgency.  Musculoskeletal: Negative for back pain, myalgias and neck  pain.  Skin: Negative for rash.  Neurological: Negative for dizziness, tingling, tremors, sensory change, speech change, focal weakness and headaches.  Endo/Heme/Allergies: Does not bruise/bleed easily.  Psychiatric/Behavioral: Negative for depression.    MEDICATIONS AT HOME:   Prior to Admission medications   Medication Sig Start Date End Date Taking? Authorizing Provider  albuterol (PROVENTIL HFA;VENTOLIN HFA) 108 (90 Base) MCG/ACT  inhaler Inhale 2 puffs into the lungs every 6 (six) hours as needed for wheezing or shortness of breath. 02/11/18  Yes Demetrios Loll, MD  ALPRAZolam Duanne Moron) 0.25 MG tablet Take 0.25 mg by mouth 2 (two) times daily as needed for anxiety.  02/17/18  Yes [provider]  amLODipine (NORVASC) 5 MG tablet Take 5 mg by mouth daily. 01/20/18  Yes [provider]  aspirin 81 MG chewable tablet Chew 1 tablet by mouth daily.   Yes [provider]  atorvastatin (LIPITOR) 40 MG tablet Take 40 mg by mouth daily. 01/20/18  Yes [provider]  folic acid (FOLVITE) 1 MG tablet Take 1 mg by mouth daily. 01/20/18  Yes [provider]  guaiFENesin-dextromethorphan (ROBITUSSIN DM) 100-10 MG/5ML syrup Take 5 mLs by mouth every 4 (four) hours as needed for cough. 02/11/18  Yes Demetrios Loll, MD  Insulin Glargine [facility]) 100 UNIT/ML SOPN Inject 36 Units into the skin at bedtime. 01/04/18  Yes [provider]  lisinopril (PRINIVIL,ZESTRIL) 40 MG tablet Take 40 mg by mouth daily. 01/20/18  Yes [provider]  metFORMIN (GLUCOPHAGE) 1000 MG tablet Take 1,000 mg by mouth 2 (two) times daily. 01/20/18  Yes [provider]  metoprolol tartrate (LOPRESSOR) 25 MG tablet Take 25 mg by mouth 2 (two) times daily. 01/20/18  Yes [provider]  ondansetron (ZOFRAN-ODT) 4 MG disintegrating tablet Take 4 mg by mouth every 8 (eight) hours as needed for nausea.  02/17/18  Yes [provider]  triamterene-hydrochlorothiazide (DYAZIDE) 37.5-25 MG capsule Take 1 capsule by mouth daily. 01/20/18  Yes [provider]  levofloxacin (LEVAQUIN) 500 MG tablet Take 1 tablet (500 mg total) by mouth daily. Patient not taking: Reported on 02/21/2018 02/11/18   Demetrios Loll, MD      VITAL SIGNS:  Blood pressure (!) 148/72, pulse 99, temperature 98.3 F (36.8 C), temperature source Oral, resp. rate 18, height 5\' 5"  (1.651 m), weight 83.9 kg (185 lb), SpO2 96  %.  PHYSICAL EXAMINATION:   Physical Exam  GENERAL:  62 y.o.-year-old patient lying in the bed with no acute distress.  EYES: Pupils equal, round, reactive to light and accommodation. No scleral icterus. Extraocular muscles intact.  HEENT: Head atraumatic, normocephalic. Oropharynx and nasopharynx clear.  NECK:  Supple, no jugular venous distention. No thyroid enlargement, no tenderness.  LUNGS: Normal breath sounds bilaterally, no wheezing, rhonchi or crepitation. Coarse bibasilar crackles heard. No use of accessory muscles of respiration.  CARDIOVASCULAR: S1, S2 normal. No murmurs, rubs, or gallops.  ABDOMEN: Soft, nontender, nondistended. Bowel sounds present. No organomegaly or mass.  EXTREMITIES: No pedal edema, cyanosis, or clubbing.  NEUROLOGIC: Cranial nerves II through XII are intact. Muscle strength 5/5 in all extremities. Sensation intact. Gait not checked. Global weakness noted. PSYCHIATRIC: The patient is alert and oriented x 3.  SKIN: No obvious rash, lesion, or ulcer.   LABORATORY PANEL:   CBC Recent Labs  Lab 02/21/18 1835  WBC 14.0*  HGB 10.2*  HCT 31.2*  PLT 575*   ------------------------------------------------------------------------------------------------------------------  Chemistries  No results for input(s): NA, K, CL, CO2, GLUCOSE,  BUN, CREATININE, CALCIUM, MG, AST, ALT, ALKPHOS, BILITOT in the last 168 hours.  Invalid input(s): GFRCGP ------------------------------------------------------------------------------------------------------------------  Cardiac Enzymes No results for input(s): TROPONINI in the last 168 hours. ------------------------------------------------------------------------------------------------------------------  RADIOLOGY:  Dg Chest 1 View  Result Date: 02/21/2018 CLINICAL DATA:  Recent diagnosis of pneumonia. Continued elevation of white blood cell count. EXAM: CHEST  1 VIEW COMPARISON:  Single-view of the chest  02/12/2014. PA and lateral chest 02/07/2018. FINDINGS: Dense peripheral opacity in the left upper lobe does not appear changed. The lungs are emphysematous with scattered scarring. Airspace disease is present the left lung base. Heart size is normal. Aortic atherosclerosis is seen. No acute bony abnormality. IMPRESSION: No notable change in left upper lobe airspace opacity and left basilar airspace disease. The findings could be due to pneumonia but are worrisome for carcinoma. Chest CT with contrast is recommended for further evaluation. Electronically Signed   By: Inge Rise M.D.   On: 02/21/2018 18:22    EKG:   Orders placed or performed during the hospital encounter of 02/21/18  . EKG 12-Lead  . EKG 12-Lead    IMPRESSION AND PLAN:   Tamara Parrish  is a 62 y.o. female with a known history of hypertension, hyperlipidemia, IDDM, rheumatoid arthritis who was admitted to the hospital for community-acquired pneumonia 2 weeks ago was discharged with home oxygen and was sent back from PCPs office due to worsening hypoxia and x-ray findings.  1.  Healthcare acquired pneumonia-worsening pneumonia in spite of treating with antibiotics recently. -Repeat blood cultures, do a CT of the chest this time -Continue supplemental oxygen -On vancomycin and meropenem.  Check MRSA PCR -Incentive spirometry.  Follow-up x-ray in 2 days -CT showing any evidence of pulmonary edema-check an echocardiogram  2.  Diabetes mellitus-continue Lantus.  Also sliding scale insulin. - Hold metformin due to contrast received with CT  3.  Hypertension-on Norvasc, lisinopril, metoprolol and triamterene/hydrochlorothiazide  4.  COPD-stable.  Continue inhalers.  No indication for systemic steroids.  5.  DVT prophylaxis-Lovenox  Physical therapy consult requested     All the records are reviewed and case discussed with ED provider. Management plans discussed with the patient, family and they are in  agreement.  CODE STATUS: Full Code  TOTAL TIME TAKING CARE OF THIS PATIENT: 50 minutes.    Gladstone Lighter M.D on 02/21/2018 at 7:02 PM  Between 7am to 6pm - Pager - [phone]  After 6pm go to [email or web address] - password [email or web address] [facility]  Office  [phone]  CC: Primary care physician; Glendon Axe, MD

## 2018-02-21 NOTE — ED Triage Notes (Signed)
States was seen in MD office and diagnosed with pneumonia. States she had recent admission here for pneumonia. General weakness, SAT 88 room air.

## 2018-02-22 ENCOUNTER — Other Ambulatory Visit: Payer: Self-pay

## 2018-02-22 LAB — BASIC METABOLIC PANEL
Anion gap: 6 (ref 5–15)
BUN: 22 mg/dL (ref 8–23)
CALCIUM: 9 mg/dL (ref 8.9–10.3)
CO2: 27 mmol/L (ref 22–32)
Chloride: 104 mmol/L (ref 98–111)
Creatinine, Ser: 0.72 mg/dL (ref 0.44–1.00)
GFR calc Af Amer: >60 mL/min (ref >60)
GFR calc non Af Amer: >60 mL/min (ref >60)
GLUCOSE: 63 mg/dL — AB (ref 70–99)
Potassium: 4.5 mmol/L (ref 3.5–5.1)
Sodium: 137 mmol/L (ref 135–145)

## 2018-02-22 LAB — CBC
HCT: 29.2 % — ABNORMAL LOW (ref 35.0–47.0)
HEMOGLOBIN: 9.6 g/dL — AB (ref 12.0–16.0)
MCH: 29.5 pg (ref 26.0–34.0)
MCHC: 32.7 g/dL (ref 32.0–36.0)
MCV: 90.1 fL (ref 80.0–100.0)
PLATELETS: 611 10*3/uL — AB (ref 150–440)
RBC: 3.24 MIL/uL — ABNORMAL LOW (ref 3.80–5.20)
RDW: 15.6 % — ABNORMAL HIGH (ref 11.5–14.5)
WBC: 13.3 10*3/uL — ABNORMAL HIGH (ref 3.6–11.0)

## 2018-02-22 LAB — GLUCOSE, CAPILLARY
GLUCOSE-CAPILLARY: 50 mg/dL — AB (ref 70–99)
GLUCOSE-CAPILLARY: 187 mg/dL — AB (ref 70–99)
GLUCOSE-CAPILLARY: 139 mg/dL — AB (ref 70–99)
GLUCOSE-CAPILLARY: 104 mg/dL — AB (ref 70–99)
Glucose-Capillary: 238 mg/dL — ABNORMAL HIGH (ref 70–99)

## 2018-02-22 MED ORDER — INSULIN ASPART 100 UNIT/ML ~~LOC~~ SOLN
3.0000 [IU] | Freq: Three times a day (TID) | SUBCUTANEOUS | Status: DC
  Administered 2018-02-22 – 2018-02-23 (×3): 3 [IU] via SUBCUTANEOUS
  Filled 2018-02-22 (×3): qty 1

## 2018-02-22 MED ORDER — INSULIN GLARGINE 100 UNIT/ML ~~LOC~~ SOLN
28.0000 [IU] | Freq: Every day | SUBCUTANEOUS | Status: DC
  Administered 2018-02-22: 28 [IU] via SUBCUTANEOUS
  Filled 2018-02-22 (×3): qty 0.28

## 2018-02-22 NOTE — Care Management Note (Addendum)
Case Management Note  Patient Details  Name: Asencion Guisinger MRN: [id number] Date of Birth: 25-Mar-1956  Subjective/Objective:                  RNCM met with patient to discuss transition of care. She plans to move in with daughter Audelia Acton at [facility] [address] [place] [address]  [phone] at discharge. She states [place] is too far away from her PCP with [facility] Dr. Candiss Norse.  She states she is on O2 at home thought [facility]. She states she is open to [facility] and would like to continue that.   Action/Plan:   [name] with [facility] updated. Expected Discharge Date:                  Expected Discharge Plan:     In-House Referral:     Discharge planning Services  CM Consult  Post Acute Care Choice:  Resumption of Svcs/PTA Provider, Home Health Choice offered to:  Patient  DME Arranged:    DME Agency:     HH Arranged:    [place] Agency:  [place]  Status of Service:  In process, will continue to follow  If discussed at Long Length of Stay Meetings, dates discussed:    Additional Comments:  Marshell Garfinkel, RN 02/22/2018, 9:53 AM

## 2018-02-22 NOTE — Progress Notes (Signed)
PT Cancellation Note  Patient Details Name: Tamara Parrish MRN: [id number] DOB: 1956/06/28   Cancelled Treatment:    Reason Eval/Treat Not Completed: Patient declined, no reason specified.  Order received.  Chart reviewed.  Pt sitting up in bed eating breakfast.  Will allow time to finish and then re-attempt.   Roxanne Gates, PT, DPT 02/22/2018, 8:29 AM

## 2018-02-22 NOTE — Progress Notes (Signed)
Inpatient Diabetes Program Recommendations  AACE/ADA: New Consensus Statement on Inpatient Glycemic Control (2015)  Target Ranges:  Prepandial:   less than 140 mg/dL      Peak postprandial:   less than 180 mg/dL (1-2 hours)      Critically ill patients:  140 - 180 mg/dL   Lab Results  Component Value Date   GLUCAP 238 (H) 02/22/2018   HGBA1C 14.7 (H) 02/08/2014    Review of Glycemic ControlResults for LENER, VENTRESCA EVA (MRN [id number]) as of 02/22/2018 12:07  Ref. Range 02/11/2018 11:47 02/21/2018 21:04 02/22/2018 08:11 02/22/2018 08:48 02/22/2018 11:13  Glucose-Capillary Latest Ref Range: 70 - 99 mg/dL 228 (H) 143 (H) 50 (L) 187 (H) 238 (H)    Diabetes history: Type 2 DM  Outpatient Diabetes medications: Basaglar 36 units q HS, Metformin 1000 mg bid Current orders for Inpatient glycemic control:  Novolog sensitive tid with meals and HS, Lantus 36 units q HS Inpatient Diabetes Program Recommendations:    Note that patient did not get HS dose of Lantus last night stating she had taken it in the AM on 02/21/18.  Please reduce Lantus to 28 units q HS. Consider adding Novolog meal coverage 3 units tid with meals.    Thanks,  Adah Perl, RN, BC-ADM Inpatient Diabetes Coordinator Pager [phone] (8a-5p)

## 2018-02-22 NOTE — Progress Notes (Signed)
[facility] NAME: Tamara Parrish    MR#:  [id number]  DATE OF BIRTH:  May 23, 1956  SUBJECTIVE:  CHIEF COMPLAINT: Shortness of breath is better.  Still coughing and denies any weight loss REVIEW OF SYSTEMS:  CONSTITUTIONAL: No fever, fatigue or weakness.  EYES: No blurred or double vision.  EARS, NOSE, AND THROAT: No tinnitus or ear pain.  RESPIRATORY: Reports intermittent episodes of cough, shortness of breath, denies wheezing or hemoptysis.  CARDIOVASCULAR: No chest pain, orthopnea, edema.  GASTROINTESTINAL: No nausea, vomiting, diarrhea or abdominal pain.  GENITOURINARY: No dysuria, hematuria.  ENDOCRINE: No polyuria, nocturia,  HEMATOLOGY: No anemia, easy bruising or bleeding SKIN: No rash or lesion. MUSCULOSKELETAL: No joint pain or arthritis.   NEUROLOGIC: No tingling, numbness, weakness.  PSYCHIATRY: No anxiety or depression.   DRUG ALLERGIES:   Allergies  Allergen Reactions  . Ibuprofen     Other reaction(s): Other (See Comments) Is not supposed to take because of her kidneys.  . Penicillins     Has patient had a PCN reaction causing immediate rash, facial/tongue/throat swelling, SOB or lightheadedness with hypotension: Unknown Has patient had a PCN reaction causing severe rash involving mucus membranes or skin necrosis: Unknown Has patient had a PCN reaction that required hospitalization: Unknown Has patient had a PCN reaction occurring within the last 10 years: Unknown If all of the above answers are "NO", then may proceed with Cephalosporin use.    VITALS:  Blood pressure (!) 104/50, pulse 80, temperature 98.9 F (37.2 C), temperature source Oral, resp. rate 19, height 5\' 5"  (1.651 m), weight 79.6 kg (175 lb 8 oz), SpO2 98 %.  PHYSICAL EXAMINATION:  GENERAL:  62 y.o.-year-old patient lying in the bed with no acute distress.  EYES: Pupils equal, round, reactive to light and accommodation. No scleral  icterus. Extraocular muscles intact.  HEENT: Head atraumatic, normocephalic. Oropharynx and nasopharynx clear.  NECK:  Supple, no jugular venous distention. No thyroid enlargement, no tenderness.  LUNGS: Moderate breath sounds bilaterally, no wheezing, rales,rhonchi or crepitation. No use of accessory muscles of respiration.  CARDIOVASCULAR: S1, S2 normal. No murmurs, rubs, or gallops.  ABDOMEN: Soft, nontender, nondistended. Bowel sounds present. No organomegaly or mass.  EXTREMITIES: No pedal edema, cyanosis, or clubbing.  NEUROLOGIC: Cranial nerves II through XII are intact. Muscle strength 5/5 in all extremities. Sensation intact. Gait not checked.  PSYCHIATRIC: The patient is alert and oriented x 3.  SKIN: No obvious rash, lesion, or ulcer.    LABORATORY PANEL:   CBC Recent Labs  Lab 02/22/18 0459  WBC 13.3*  HGB 9.6*  HCT 29.2*  PLT 611*   ------------------------------------------------------------------------------------------------------------------  Chemistries  Recent Labs  Lab 02/21/18 1835 02/22/18 0459  NA 135 137  K 4.6 4.5  CL 101 104  CO2 25 27  GLUCOSE 198* 63*  BUN 23 22  CREATININE 1.06* 0.72  CALCIUM 9.1 9.0  AST 20  --   ALT 6  --   ALKPHOS 83  --   BILITOT 0.6  --    ------------------------------------------------------------------------------------------------------------------  Cardiac Enzymes No results for input(s): TROPONINI in the last 168 hours. ------------------------------------------------------------------------------------------------------------------  RADIOLOGY:  Dg Chest 1 View  Result Date: 02/21/2018 CLINICAL DATA:  Recent diagnosis of pneumonia. Continued elevation of white blood cell count. EXAM: CHEST  1 VIEW COMPARISON:  Single-view of the chest 02/12/2014. PA and lateral chest 02/07/2018. FINDINGS: Dense peripheral opacity in the left upper lobe does not appear changed. The  lungs are emphysematous with scattered  scarring. Airspace disease is present the left lung base. Heart size is normal. Aortic atherosclerosis is seen. No acute bony abnormality. IMPRESSION: No notable change in left upper lobe airspace opacity and left basilar airspace disease. The findings could be due to pneumonia but are worrisome for carcinoma. Chest CT with contrast is recommended for further evaluation. Electronically Signed   By: Inge Rise M.D.   On: 02/21/2018 18:22   Ct Chest W Contrast  Result Date: 02/21/2018 CLINICAL DATA:  62 year old female with persistent shortness of breath and elevated white count. RIGHT UPPER lung opacity not improved on today's chest radiograph following pneumonia treatment. EXAM: CT CHEST WITH CONTRAST TECHNIQUE: Multidetector CT imaging of the chest was performed during intravenous contrast administration. CONTRAST:  52mL OMNIPAQUE IOHEXOL 300 MG/ML  SOLN COMPARISON:  02/21/2018, 02/07/2018 and prior chest radiographs FINDINGS: Cardiovascular: Cardiomegaly noted. Coronary artery and aortic atherosclerotic calcifications noted. No thoracic aortic aneurysm or pericardial effusion. Mediastinum/Nodes: Enlarged mediastinal and bilateral hilar lymph nodes are identified with the following index nodes: A 1.5 cm AP window node (series 2: Image 47) A 1.2 cm subcarinal node (2:62) A 1.4 cm pretracheal node (2:49) A 1.2 cm RIGHT hilar node (2:68) No other mediastinal mass identified. The visualized thyroid gland and esophagus are unremarkable. Lungs/Pleura: 4.5 x 8 x 11 cm LEFT UPPER lobe consolidation has masslike features and suspicious for malignancy. More discrete pulmonary nodules include the following: A 1 x 1.3 cm irregular lingular nodule (3:72) A 0.4 x 0.6 cm anterior subpleural nodule along the RIGHT minor fissure (3:78) A 1.2 x 1.5 cm RIGHT LOWER lobe nodule (3:99). Airspace disease/possible consolidation scattered within the SUPERIOR segment of the RIGHT LOWER lobe and scattered throughout bilateral LOWER  lobes noted. There is interlobular septal thickening present, greatest in the RIGHT UPPER lobe and bilateral LOWER lobes. No pleural effusion or pneumothorax. Upper Abdomen: No acute abnormality identified. No focal hepatic or adrenal lesions or UPPER abdominal adenopathy. Musculoskeletal: No acute or suspicious bony lesions. IMPRESSION: 1. 4.5 x 8 x 11 cm LEFT UPPER lobe consolidation with masslike features suspicious for malignancy. Further evaluation recommended. 2. Indeterminate bilateral pulmonary nodules and enlarged mediastinal and bilateral hilar lymph nodes which may represent metastatic disease. 3. Airspace disease/opacities scattered within the SUPERIOR segment of the RIGHT LOWER lobe and bilateral LOWER lobes which have the appearance more typical of infection or inflammation. 4. Probable chronic interstitial lung disease 5. Cardiomegaly 6. Coronary artery and Aortic Atherosclerosis (ICD10-I70.0). Electronically Signed   By: Margarette Canada M.D.   On: 02/21/2018 20:05    EKG:   Orders placed or performed during the hospital encounter of 02/21/18  . EKG 12-Lead  . EKG 12-Lead  . EKG    ASSESSMENT AND PLAN:   Annalie Wenner  is a 62 y.o. female with a known history of hypertension, hyperlipidemia, IDDM, rheumatoid arthritis who was admitted to the hospital for community-acquired pneumonia 2 weeks ago was discharged with home oxygen and was sent back from PCPs office due to worsening hypoxia and x-ray findings.  1.  Healthcare acquired pneumonia-worsening pneumonia in spite of treating with antibiotics recently. -Clinically feeling better Continue meropenem and discontinue vancomycin-MRSA PCR negative Repeat blood cultures -CT scan with left upper lobe consolidation suspicious for malignancy, enlarged bilateral hilar and mediastinal lymph nodes.  If no clinical improvement in a day or 2 we will get oncology consult.  Also revealing chronic interstitial lung disease -Continue supplemental  oxygen -On vancomycin and meropenem.  Check MRSA PCR -Incentive spirometry.  Follow-up x-ray in 2 days   2.  Diabetes mellitus-continue Lantus.  Also sliding scale insulin. - Hold metformin due to contrast received with CT  3.  Hypertension-on Norvasc, lisinopril, metoprolol and triamterene/hydrochlorothiazide  4.  COPD-stable.  Continue inhalers.  No indication for systemic steroids.  5.  DVT prophylaxis-Lovenox  Physical therapy consult requested       All the records are reviewed and case discussed with Care Management/Social Workerr. Management plans discussed with the patient, family and they are in agreement.  CODE STATUS: fc   TOTAL TIME TAKING CARE OF THIS PATIENT: 37 minutes.   POSSIBLE D/C IN 2 DAYS, DEPENDING ON CLINICAL CONDITION.  Note: This dictation was prepared with Dragon dictation along with smaller phrase technology. Any transcriptional errors that result from this process are unintentional.   Nicholes Mango M.D on 02/22/2018 at 5:18 PM  Between 7am to 6pm - Pager - [phone] After 6pm go to [email or web address] - password [email or web address] [facility]  [facility]  Office  [phone]  CC: Primary care physician; Glendon Axe, MD

## 2018-02-22 NOTE — Evaluation (Signed)
Physical Therapy Evaluation Patient Details Name: Tamara Parrish MRN: [id number] DOB: 1955-11-24 Today's Date: 02/22/2018   History of Present Illness  Pt is a 62 year old female admitted with HCAP.  PMH includes RA, osteomyelitis and Htn.  Clinical Impression  Pt is a 62 year old female who plans to move in with her daughter in [place] who lives in a ground level apartment following DC.  She is able to ambulate with a RW at baseline.  Pt able to perform bed mobility Mod I and sit at EOB with good sitting balance.  Pt presented with overall decreased strength of UE and LE bilaterally.  STS transfer was performed with supervision due to pt being unsteady on feet and requiring min VC's for use of RW.  She was able to ambulate 150 ft with RW with unsteady gait indicative of fall risk and need for education concerning use of RW.  Pt presented with fatigue and request to return to room which she was able to do without a rest break.  Pt desat to 90% during ambulation and quickly recovered when sitting.  Pt will continue to benefit from skilled PT with focus on strength, safe use of AD, balance and fall prevention and tolerance to activity.    Follow Up Recommendations Outpatient PT    Equipment Recommendations  None recommended by PT    Recommendations for Other Services       Precautions / Restrictions Precautions Precautions: Fall Restrictions Weight Bearing Restrictions: No      Mobility  Bed Mobility Overal bed mobility: Modified Independent             General bed mobility comments: Increased time  Transfers Overall transfer level: Needs assistance   Transfers: Sit to/from Stand Sit to Stand: Supervision         General transfer comment: VC's for safe use of RW and close supervision due to pt  unsteadiness on feet.  Ambulation/Gait Ambulation/Gait assistance: Min assist Gait Distance (Feet): 150 Feet Assistive device: Rolling walker (2 wheeled) Gait  Pattern/deviations: Step-through pattern;Decreased step length - right;Decreased step length - left   Gait velocity interpretation: <1.8 ft/sec, indicate of risk for recurrent falls General Gait Details: Maintains a slow gait with flexed posture, appears unsteady on feet with occasional mild lateral deviation (every 6-8 steps), tendency to push RW anteriorly which PT provided education concerning.  Pt expressed understanding concerning RW placement and fall prevention.  Stairs            Wheelchair Mobility    Modified Rankin (Stroke Patients Only)       Balance Overall balance assessment: Needs assistance Sitting-balance support: Feet supported Sitting balance-Leahy Scale: Fair     Standing balance support: Bilateral upper extremity supported;During functional activity Standing balance-Leahy Scale: Fair                               Pertinent Vitals/Pain Pain Assessment: No/denies pain    Home Living Family/patient expects to be discharged to:: Private residence Living Arrangements: Children Available Help at Discharge: Family;Available PRN/intermittently(Works  during the day but gransson's are home.) Type of Home: Apartment Home Access: Level entry     Home Layout: One level Home Equipment: Walker - 2 wheels      Prior Function Level of Independence: Independent with assistive device(s)         Comments: Pt works part time taking client to doc appointment. and is  able to drive.  Has been using RW recently due to fatigue.       Hand Dominance        Extremity/Trunk Assessment   Upper Extremity Assessment Upper Extremity Assessment: Generalized weakness(Grossly 4-/5 bilaterally.)    Lower Extremity Assessment Lower Extremity Assessment: Overall WFL for tasks assessed(Grossly 4/5 bilaterally.)    Cervical / Trunk Assessment Cervical / Trunk Assessment: Kyphotic(Slightly kyphotic posture.)  Communication      Cognition  Arousal/Alertness: Awake/alert Behavior During Therapy: WFL for tasks assessed/performed Overall Cognitive Status: Within Functional Limits for tasks assessed                                        General Comments      Exercises Other Exercises Other Exercises: Education provided regarding safe use of RW and benefit of outpatient PT vs pulmonary rehab. x8 min   Assessment/Plan    PT Assessment Patient needs continued PT services  PT Problem List Decreased strength;Decreased range of motion;Decreased activity tolerance;Decreased balance;Decreased mobility;Decreased coordination;Decreased knowledge of use of DME;Decreased safety awareness;Decreased cognition;Decreased knowledge of precautions;Cardiopulmonary status limiting activity       PT Treatment Interventions DME instruction;Gait training;Functional mobility training;Therapeutic activities;Therapeutic exercise;Balance training;Neuromuscular re-education;Patient/family education    PT Goals (Current goals can be found in the Care Plan section)  Acute Rehab PT Goals Patient Stated Goal: To move into her daughter's house to be closer to doctor's appointments. PT Goal Formulation: With patient Time For Goal Achievement: 03/08/18 Potential to Achieve Goals: Good    Frequency Min 2X/week   Barriers to discharge        Co-evaluation               AM-PAC PT "6 Clicks" Daily Activity  Outcome Measure Difficulty turning over in bed (including adjusting bedclothes, sheets and blankets)?: A Little Difficulty moving from lying on back to sitting on the side of the bed? : A Little Difficulty sitting down on and standing up from a chair with arms (e.g., wheelchair, bedside commode, etc,.)?: A Little Help needed moving to and from a bed to chair (including a wheelchair)?: A Little Help needed walking in hospital room?: A Little Help needed climbing 3-5 steps with a railing? : A Lot 6 Click Score: 17    End  of Session Equipment Utilized During Treatment: Gait belt;Oxygen Activity Tolerance: Patient limited by fatigue;Treatment limited secondary to medical complications (Comment) Patient left: in bed;with call bell/phone within reach;with bed alarm set;with family/visitor present Nurse Communication: Mobility status PT Visit Diagnosis: Unsteadiness on feet (R26.81);Muscle weakness (generalized) (M62.81);Difficulty in walking, not elsewhere classified (R26.2)    Time: 9292-4462 PT Time Calculation (min) (ACUTE ONLY): 35 min   Charges:   PT Evaluation $PT Eval Low Complexity: 1 Low PT Treatments $Therapeutic Activity: 8-22 mins   PT G Codes:   PT G-Codes **NOT FOR INPATIENT CLASS** Functional Assessment Tool Used: AM-PAC 6 Clicks Basic Mobility   Roxanne Gates, PT, DPT   Roxanne Gates 02/22/2018, 11:26 AM

## 2018-02-23 ENCOUNTER — Telehealth: Payer: Self-pay | Admitting: *Deleted

## 2018-02-23 DIAGNOSIS — J189 Pneumonia, unspecified organism: Principal | ICD-10-CM

## 2018-02-23 DIAGNOSIS — R918 Other nonspecific abnormal finding of lung field: Secondary | ICD-10-CM

## 2018-02-23 LAB — GLUCOSE, CAPILLARY
GLUCOSE-CAPILLARY: 64 mg/dL — AB (ref 70–99)
GLUCOSE-CAPILLARY: 91 mg/dL (ref 70–99)
GLUCOSE-CAPILLARY: 134 mg/dL — AB (ref 70–99)
Glucose-Capillary: 207 mg/dL — ABNORMAL HIGH (ref 70–99)
Glucose-Capillary: 174 mg/dL — ABNORMAL HIGH (ref 70–99)
Glucose-Capillary: 230 mg/dL — ABNORMAL HIGH (ref 70–99)

## 2018-02-23 LAB — URINALYSIS, COMPLETE (UACMP) WITH MICROSCOPIC
BACTERIA UA: NONE SEEN
Bilirubin Urine: NEGATIVE
Glucose, UA: NEGATIVE mg/dL
Hgb urine dipstick: NEGATIVE
KETONES UR: NEGATIVE mg/dL
LEUKOCYTES UA: NEGATIVE
Nitrite: NEGATIVE
PH: 6 (ref 5.0–8.0)
PROTEIN: NEGATIVE mg/dL
Specific Gravity, Urine: 1.006 (ref 1.005–1.030)

## 2018-02-23 LAB — CBC
HCT: 27.7 % — ABNORMAL LOW (ref 35.0–47.0)
Hemoglobin: 9.2 g/dL — ABNORMAL LOW (ref 12.0–16.0)
MCH: 30.3 pg (ref 26.0–34.0)
MCHC: 33.3 g/dL (ref 32.0–36.0)
MCV: 90.8 fL (ref 80.0–100.0)
PLATELETS: 578 10*3/uL — AB (ref 150–440)
RBC: 3.05 MIL/uL — ABNORMAL LOW (ref 3.80–5.20)
RDW: 16.1 % — AB (ref 11.5–14.5)
WBC: 11.2 10*3/uL — AB (ref 3.6–11.0)

## 2018-02-23 MED ORDER — INSULIN GLARGINE 100 UNIT/ML ~~LOC~~ SOLN
22.0000 [IU] | Freq: Every day | SUBCUTANEOUS | Status: DC
  Administered 2018-02-23: 22 [IU] via SUBCUTANEOUS
  Filled 2018-02-23 (×2): qty 0.22

## 2018-02-23 MED ORDER — MUSCLE RUB 10-15 % EX CREA
TOPICAL_CREAM | CUTANEOUS | Status: DC | PRN
  Administered 2018-02-23: 22:00:00 via TOPICAL
  Filled 2018-02-23: qty 85

## 2018-02-23 MED ORDER — GUAIFENESIN-DM 100-10 MG/5ML PO SYRP
5.0000 mL | ORAL_SOLUTION | ORAL | Status: DC | PRN
  Administered 2018-02-23: 5 mL via ORAL
  Filled 2018-02-23: qty 5

## 2018-02-23 NOTE — Progress Notes (Signed)
[facility] [place] NAME: Tamara Parrish    MR#:  [id number]  DATE OF BIRTH:  10-27-1955  SUBJECTIVE:   Shortness of breath is better.  Still coughing and denies any weight loss Quit smoking 2 months ago REVIEW OF SYSTEMS:  CONSTITUTIONAL: No fever, fatigue or weakness.  EYES: No blurred or double vision.  EARS, NOSE, AND THROAT: No tinnitus or ear pain.  RESPIRATORY: Reports intermittent episodes of cough, shortness of breath, denies wheezing or hemoptysis.  CARDIOVASCULAR: No chest pain, orthopnea, edema.  GASTROINTESTINAL: No nausea, vomiting, diarrhea or abdominal pain.  GENITOURINARY: No dysuria, hematuria.  ENDOCRINE: No polyuria, nocturia,  HEMATOLOGY: No anemia, easy bruising or bleeding SKIN: No rash or lesion. MUSCULOSKELETAL: No joint pain or arthritis.   NEUROLOGIC: No tingling, numbness, weakness.  PSYCHIATRY: No anxiety or depression.   DRUG ALLERGIES:   Allergies  Allergen Reactions  . Ibuprofen     Other reaction(s): Other (See Comments) Is not supposed to take because of her kidneys.  . Penicillins     Has patient had a PCN reaction causing immediate rash, facial/tongue/throat swelling, SOB or lightheadedness with hypotension: Unknown Has patient had a PCN reaction causing severe rash involving mucus membranes or skin necrosis: Unknown Has patient had a PCN reaction that required hospitalization: Unknown Has patient had a PCN reaction occurring within the last 10 years: Unknown If all of the above answers are "NO", then may proceed with Cephalosporin use.    VITALS:  Blood pressure (!) 105/57, pulse 78, temperature 98 F (36.7 C), temperature source Oral, resp. rate 19, height 5\' 5"  (1.651 m), weight 79.6 kg (175 lb 8 oz), SpO2 99 %.  PHYSICAL EXAMINATION:  GENERAL:  62 y.o.-year-old patient lying in the bed with no acute distress.  EYES: Pupils equal, round, reactive to light and accommodation. No  scleral icterus. Extraocular muscles intact.  HEENT: Head atraumatic, normocephalic. Oropharynx and nasopharynx clear.  NECK:  Supple, no jugular venous distention. No thyroid enlargement, no tenderness.  LUNGS: Moderate breath sounds bilaterally, no wheezing, rales,rhonchi or crepitation. No use of accessory muscles of respiration.  CARDIOVASCULAR: S1, S2 normal. No murmurs, rubs, or gallops.  ABDOMEN: Soft, nontender, nondistended. Bowel sounds present. No organomegaly or mass.  EXTREMITIES: No pedal edema, cyanosis, or clubbing.  NEUROLOGIC: Cranial nerves II through XII are intact. Muscle strength 5/5 in all extremities. Sensation intact. Gait not checked.  PSYCHIATRIC: The patient is alert and oriented x 3.  SKIN: No obvious rash, lesion, or ulcer.    LABORATORY PANEL:   CBC Recent Labs  Lab 02/23/18 0418  WBC 11.2*  HGB 9.2*  HCT 27.7*  PLT 578*   ------------------------------------------------------------------------------------------------------------------  Chemistries  Recent Labs  Lab 02/21/18 1835 02/22/18 0459  NA 135 137  K 4.6 4.5  CL 101 104  CO2 25 27  GLUCOSE 198* 63*  BUN 23 22  CREATININE 1.06* 0.72  CALCIUM 9.1 9.0  AST 20  --   ALT 6  --   ALKPHOS 83  --   BILITOT 0.6  --    ------------------------------------------------------------------------------------------------------------------  Cardiac Enzymes No results for input(s): TROPONINI in the last 168 hours. ------------------------------------------------------------------------------------------------------------------  RADIOLOGY:  Dg Chest 1 View  Result Date: 02/21/2018 CLINICAL DATA:  Recent diagnosis of pneumonia. Continued elevation of white blood cell count. EXAM: CHEST  1 VIEW COMPARISON:  Single-view of the chest 02/12/2014. PA and lateral chest 02/07/2018. FINDINGS: Dense peripheral opacity in the left upper lobe does  not appear changed. The lungs are emphysematous with scattered  scarring. Airspace disease is present the left lung base. Heart size is normal. Aortic atherosclerosis is seen. No acute bony abnormality. IMPRESSION: No notable change in left upper lobe airspace opacity and left basilar airspace disease. The findings could be due to pneumonia but are worrisome for carcinoma. Chest CT with contrast is recommended for further evaluation. Electronically Signed   By: Inge Rise M.D.   On: 02/21/2018 18:22   Ct Chest W Contrast  Result Date: 02/21/2018 CLINICAL DATA:  62 year old female with persistent shortness of breath and elevated white count. RIGHT UPPER lung opacity not improved on today's chest radiograph following pneumonia treatment. EXAM: CT CHEST WITH CONTRAST TECHNIQUE: Multidetector CT imaging of the chest was performed during intravenous contrast administration. CONTRAST:  63mL OMNIPAQUE IOHEXOL 300 MG/ML  SOLN COMPARISON:  02/21/2018, 02/07/2018 and prior chest radiographs FINDINGS: Cardiovascular: Cardiomegaly noted. Coronary artery and aortic atherosclerotic calcifications noted. No thoracic aortic aneurysm or pericardial effusion. Mediastinum/Nodes: Enlarged mediastinal and bilateral hilar lymph nodes are identified with the following index nodes: A 1.5 cm AP window node (series 2: Image 47) A 1.2 cm subcarinal node (2:62) A 1.4 cm pretracheal node (2:49) A 1.2 cm RIGHT hilar node (2:68) No other mediastinal mass identified. The visualized thyroid gland and esophagus are unremarkable. Lungs/Pleura: 4.5 x 8 x 11 cm LEFT UPPER lobe consolidation has masslike features and suspicious for malignancy. More discrete pulmonary nodules include the following: A 1 x 1.3 cm irregular lingular nodule (3:72) A 0.4 x 0.6 cm anterior subpleural nodule along the RIGHT minor fissure (3:78) A 1.2 x 1.5 cm RIGHT LOWER lobe nodule (3:99). Airspace disease/possible consolidation scattered within the SUPERIOR segment of the RIGHT LOWER lobe and scattered throughout bilateral LOWER  lobes noted. There is interlobular septal thickening present, greatest in the RIGHT UPPER lobe and bilateral LOWER lobes. No pleural effusion or pneumothorax. Upper Abdomen: No acute abnormality identified. No focal hepatic or adrenal lesions or UPPER abdominal adenopathy. Musculoskeletal: No acute or suspicious bony lesions. IMPRESSION: 1. 4.5 x 8 x 11 cm LEFT UPPER lobe consolidation with masslike features suspicious for malignancy. Further evaluation recommended. 2. Indeterminate bilateral pulmonary nodules and enlarged mediastinal and bilateral hilar lymph nodes which may represent metastatic disease. 3. Airspace disease/opacities scattered within the SUPERIOR segment of the RIGHT LOWER lobe and bilateral LOWER lobes which have the appearance more typical of infection or inflammation. 4. Probable chronic interstitial lung disease 5. Cardiomegaly 6. Coronary artery and Aortic Atherosclerosis (ICD10-I70.0). Electronically Signed   By: Margarette Canada M.D.   On: 02/21/2018 20:05    EKG:   Orders placed or performed during the hospital encounter of 02/21/18  . EKG 12-Lead  . EKG 12-Lead  . EKG    ASSESSMENT AND PLAN:   Tamara Parrish  is a 62 y.o. female with a known history of hypertension, hyperlipidemia, IDDM, rheumatoid arthritis who was admitted to the hospital for community-acquired pneumonia 2 weeks ago was discharged with home oxygen and was sent back from PCPs office due to worsening hypoxia and x-ray findings.  1.left UL consolidation vs lung massworsening pneumonia in spite of treating with antibiotics recently. -Clinically feeling better Continue meropenem and discontinue vancomycin-MRSA PCR negative Repeat blood cultures negative -CT scan with left upper lobe consolidation suspicious for malignancy, enlarged bilateral hilar and mediastinal lymph nodes.  -spoke with Dr Isidore Moos-- consider Bronch given CT chest finding and chronic smoking - Also revealing chronic interstitial lung  disease -Continue supplemental  oxygen -Incentive spirometry.  2.  Diabetes mellitus-continue Lantus.  Also sliding scale insulin. - Hold metformin due to contrast received with CT  3.  Hypertension-on Norvasc, lisinopril, metoprolol and triamterene/hydrochlorothiazide  4.  COPD-stable.  Continue inhalers.  No indication for systemic steroids.  5.  DVT prophylaxis-Lovenox  Physical therapy consult requested--recommends out pt PT  All the records are reviewed and case discussed with Care Management/Social Workerr. Management plans discussed with the patient, family and they are in agreement.  CODE STATUS: fc   TOTAL TIME TAKING CARE OF THIS PATIENT: 30 minutes.   POSSIBLE D/C IN 1 DAYS, DEPENDING ON CLINICAL CONDITION.  Note: This dictation was prepared with Dragon dictation along with smaller phrase technology. Any transcriptional errors that result from this process are unintentional.   Fritzi Mandes M.D on 02/23/2018 at 11:05 AM  Between 7am to 6pm - Pager - [phone] After 6pm go to [email or web address] - password [email or web address] [facility]  [facility]  Office  [phone]  CC: Primary care physician; Glendon Axe, MD

## 2018-02-23 NOTE — Telephone Encounter (Signed)
-----   Message from [name], MD sent at 02/23/2018  1:16 PM EDT ----- Regarding: pls schedule CT and HFU Pt needs CT chest with contrast for lung mass in 5 weeks and follow up a week later.

## 2018-02-23 NOTE — Consult Note (Signed)
[facility] Consultation      Assessment and Plan:  Masslike infiltrate of left upper lobe with mediastinal lymphadenopathy.  Suspicious for malignancy. Pulmonary fibrosis, likely related to underlying rheumatoid arthritis. History of smoking, recently quit.  - Discussed risks and benefits of bronchoscopy for further diagnosis of the patient's non-resolving pneumonia.  Patient and daughter is aware of risk of cancer and potential worsening of cancer if we take a wait and see approach.  However given her clinical improvement they feel that they would prefer to wait, repeat a CT chest and make further decisions at that time. - She is given my card, my office will be contacting her to repeat a CT chest in 4 to 5 weeks, and follow-up 1 week later.  She is asked to call us earlier if she has any worsening symptoms.   Date: 02/23/2018  MRN# [id number] Tamara Parrish 1956-01-16  Referring Physician:   Vincenza Dail is a 62 y.o. old female seen in consultation for chief complaint of:    Chief Complaint  Patient presents with  . Shortness of Breath    HPI:  The patient is a 62 year old female with a history of hypertension, hyperlipidemia, rheumatoid arthritis.  She was admitted about 2 weeks ago for pneumonia with sepsis from 6/17 to 02/11/2018.  She went to follow-up with her PCP, found to have persistent chest x-ray changes, with elevated white blood cell count. Review of CBC shows mildly elevated white count of 13.3, blood cultures negative thus far.  HIV screen, absolute eosinophil count elevated at 600 negative.  Platelet count normal.  Currently the patient denies weight loss, she feels that her left-sided chest pain has significant improved, her breathing has not really improved as well.  She denies hemoptysis.  She previously had a productive cough which has improved she feels that she left the hospital previously before she should have and that the pneumonia  had not completely gone away.  Patient's daughter was present in the room, another daughter was present via telephone.  Patient has no new complaints at this time.  **CT chest 02/21/2018, in comparison with recent chest x-rays>> imaging personally reviewed, there are fibrotic changes of the lungs bibasilarly, greatest in the left, these are also seen in the lingula peripherally, right apex. There is masslike consolidation in the apical posterior segment of the left upper lobe.  There is significant lymphadenopathy seen in the right hilar, subcarinal and right paratracheal area.  There is a pulmonary arterial enlargement suggestive of pulmonary hypertension. Opacification of the left upper lobe appears advanced in comparison to previous chest x-ray 2 weeks prior   PMHX:   Past Medical History:  Diagnosis Date  . DM2 (diabetes mellitus, type 2) ([place])   . Hyperlipemia   . Hypertension   . Osteomyelitis ([place])   . RA (rheumatoid arthritis) ([facility])    Surgical Hx:  Past Surgical History:  Procedure Laterality Date  . 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 Diabetes Mother    Social Hx:   Social History   Tobacco Use  . Smoking status: Former Smoker    Last attempt to quit: 02/08/2018    Years since quitting: 0.0  . Smokeless tobacco: Never Used  . Tobacco comment: quit 2 weeks ago  Substance Use Topics  . Alcohol use: Never    Frequency: Never  . Drug use: Not on file  Medication:    Current Facility-Administered Medications:  .  acetaminophen (TYLENOL) tablet 650 mg, 650 mg, Oral, Q6H PRN **OR** acetaminophen (TYLENOL) suppository 650 mg, 650 mg, Rectal, Q6H PRN, Gladstone Lighter, MD .  ALPRAZolam Duanne Moron) tablet 0.25 mg, 0.25 mg, Oral, BID PRN, Gladstone Lighter, MD .  amLODipine (NORVASC) tablet 5 mg, 5 mg, Oral, Daily, Gladstone Lighter, MD, Stopped at 02/23/18 1054 .  aspirin chewable tablet 81 mg, 81 mg, Oral, Daily,  Kalisetti, Radhika, MD, 81 mg at 02/23/18 1033 .  atorvastatin (LIPITOR) tablet 40 mg, 40 mg, Oral, Daily, Gladstone Lighter, MD, 40 mg at 02/23/18 1033 .  enoxaparin (LOVENOX) injection 40 mg, 40 mg, Subcutaneous, Q24H, Kalisetti, Radhika, MD, 40 mg at 10/62/69 4854 .  folic acid (FOLVITE) tablet 1 mg, 1 mg, Oral, Daily, Gladstone Lighter, MD, 1 mg at 02/23/18 1034 .  insulin aspart (novoLOG) injection 0-5 Units, 0-5 Units, Subcutaneous, QHS, Kalisetti, Radhika, MD .  insulin aspart (novoLOG) injection 0-9 Units, 0-9 Units, Subcutaneous, TID WC, Gladstone Lighter, MD, 1 Units at 02/22/18 1728 .  insulin aspart (novoLOG) injection 3 Units, 3 Units, Subcutaneous, TID WC, Gouru, Aruna, MD, 3 Units at 02/22/18 1728 .  insulin glargine (LANTUS) injection 22 Units, 22 Units, Subcutaneous, QHS, Patel, Sona, MD .  lisinopril (PRINIVIL,ZESTRIL) tablet 40 mg, 40 mg, Oral, Daily, Gladstone Lighter, MD, Stopped at 02/23/18 1055 .  meropenem (MERREM) 1 g in sodium chloride 0.9 % 100 mL IVPB, 1 g, Intravenous, Q8H, Hallaji, Sheema M, RPH, Stopped at 02/23/18 0617 .  metoprolol tartrate (LOPRESSOR) tablet 25 mg, 25 mg, Oral, BID, Gladstone Lighter, MD, Stopped at 02/23/18 1055 .  ondansetron (ZOFRAN) tablet 4 mg, 4 mg, Oral, Q6H PRN **OR** ondansetron (ZOFRAN) injection 4 mg, 4 mg, Intravenous, Q6H PRN, Gladstone Lighter, MD .  triamterene-hydrochlorothiazide (MAXZIDE-25) 37.5-25 MG per tablet 1 tablet, 1 tablet, Oral, Daily, Gladstone Lighter, MD, Stopped at 02/23/18 1055   Allergies:  Ibuprofen and Penicillins  Review of Systems: Gen:  Denies  fever, sweats, chills HEENT: Denies blurred vision, double vision. bleeds, sore throat Cvc:  No dizziness, chest pain. Resp:   Denies cough or sputum production, shortness of breath Gi: Denies swallowing difficulty, stomach pain. Gu:  Denies bladder incontinence, burning urine Ext:   No Joint pain, stiffness. Skin: No skin rash,  hives  Endoc:  No  polyuria, polydipsia. Psych: No depression, insomnia. Other:  All other systems were reviewed with the patient and were negative other that what is mentioned in the HPI.   Physical Examination:   VS: BP (!) 105/57 (BP Location: Right Arm)   Pulse 78   Temp 98 F (36.7 C) (Oral)   Resp 19   Ht 5\' 5"  (1.651 m)   Wt 175 lb 8 oz (79.6 kg)   SpO2 99%   BMI 29.20 kg/m   General Appearance: No distress  Neuro:without focal findings,  speech normal,  HEENT: PERRLA, EOM intact.   Pulmonary: Scattered crackles CardiovascularNormal S1,S2.  No m/r/g.   Abdomen: Benign, Soft, non-tender. Renal:  No costovertebral tenderness  GU:  No performed at this time. Endoc: No evident thyromegaly, no signs of acromegaly. Skin:   warm, no rashes, no ecchymosis  Extremities: normal, no cyanosis, clubbing.  Other findings:    LABORATORY PANEL:   CBC Recent Labs  Lab 02/23/18 0418  WBC 11.2*  HGB 9.2*  HCT 27.7*  PLT 578*   ------------------------------------------------------------------------------------------------------------------  Chemistries  Recent Labs  Lab 02/21/18 1835 02/22/18 0459  NA 135 137  K 4.6 4.5  CL 101 104  CO2 25 27  GLUCOSE 198* 63*  BUN 23 22  CREATININE 1.06* 0.72  CALCIUM 9.1 9.0  AST 20  --   ALT 6  --   ALKPHOS 83  --   BILITOT 0.6  --    ------------------------------------------------------------------------------------------------------------------  Cardiac Enzymes No results for input(s): TROPONINI in the last 168 hours. ------------------------------------------------------------  RADIOLOGY:  Dg Chest 1 View  Result Date: 02/21/2018 CLINICAL DATA:  Recent diagnosis of pneumonia. Continued elevation of white blood cell count. EXAM: CHEST  1 VIEW COMPARISON:  Single-view of the chest 02/12/2014. PA and lateral chest 02/07/2018. FINDINGS: Dense peripheral opacity in the left upper lobe does not appear changed. The lungs are emphysematous with  scattered scarring. Airspace disease is present the left lung base. Heart size is normal. Aortic atherosclerosis is seen. No acute bony abnormality. IMPRESSION: No notable change in left upper lobe airspace opacity and left basilar airspace disease. The findings could be due to pneumonia but are worrisome for carcinoma. Chest CT with contrast is recommended for further evaluation. Electronically Signed   By: Inge Rise M.D.   On: 02/21/2018 18:22   Ct Chest W Contrast  Result Date: 02/21/2018 CLINICAL DATA:  62 year old female with persistent shortness of breath and elevated white count. RIGHT UPPER lung opacity not improved on today's chest radiograph following pneumonia treatment. EXAM: CT CHEST WITH CONTRAST TECHNIQUE: Multidetector CT imaging of the chest was performed during intravenous contrast administration. CONTRAST:  49mL OMNIPAQUE IOHEXOL 300 MG/ML  SOLN COMPARISON:  02/21/2018, 02/07/2018 and prior chest radiographs FINDINGS: Cardiovascular: Cardiomegaly noted. Coronary artery and aortic atherosclerotic calcifications noted. No thoracic aortic aneurysm or pericardial effusion. Mediastinum/Nodes: Enlarged mediastinal and bilateral hilar lymph nodes are identified with the following index nodes: A 1.5 cm AP window node (series 2: Image 47) A 1.2 cm subcarinal node (2:62) A 1.4 cm pretracheal node (2:49) A 1.2 cm RIGHT hilar node (2:68) No other mediastinal mass identified. The visualized thyroid gland and esophagus are unremarkable. Lungs/Pleura: 4.5 x 8 x 11 cm LEFT UPPER lobe consolidation has masslike features and suspicious for malignancy. More discrete pulmonary nodules include the following: A 1 x 1.3 cm irregular lingular nodule (3:72) A 0.4 x 0.6 cm anterior subpleural nodule along the RIGHT minor fissure (3:78) A 1.2 x 1.5 cm RIGHT LOWER lobe nodule (3:99). Airspace disease/possible consolidation scattered within the SUPERIOR segment of the RIGHT LOWER lobe and scattered throughout  bilateral LOWER lobes noted. There is interlobular septal thickening present, greatest in the RIGHT UPPER lobe and bilateral LOWER lobes. No pleural effusion or pneumothorax. Upper Abdomen: No acute abnormality identified. No focal hepatic or adrenal lesions or UPPER abdominal adenopathy. Musculoskeletal: No acute or suspicious bony lesions. IMPRESSION: 1. 4.5 x 8 x 11 cm LEFT UPPER lobe consolidation with masslike features suspicious for malignancy. Further evaluation recommended. 2. Indeterminate bilateral pulmonary nodules and enlarged mediastinal and bilateral hilar lymph nodes which may represent metastatic disease. 3. Airspace disease/opacities scattered within the SUPERIOR segment of the RIGHT LOWER lobe and bilateral LOWER lobes which have the appearance more typical of infection or inflammation. 4. Probable chronic interstitial lung disease 5. Cardiomegaly 6. Coronary artery and Aortic Atherosclerosis (ICD10-I70.0). Electronically Signed   By: Margarette Canada M.D.   On: 02/21/2018 20:05       Thank  you for the consultation and for allowing [facility] to assist in the care of your patient. Our recommendations are noted above.  Please contact us if we can be of further service.   Marda Stalker, M.D., F.C.C.P.  Board Certified in Internal Medicine, Pulmonary Medicine, [place], and Sleep Medicine.  [facility] Office Number: [phone]   02/23/2018

## 2018-02-23 NOTE — Telephone Encounter (Signed)
This will be done after discharge. We are not able to place orders while patient in hospital.

## 2018-02-23 NOTE — Progress Notes (Signed)
Inpatient Diabetes Program Recommendations  AACE/ADA: New Consensus Statement on Inpatient Glycemic Control (2015)  Target Ranges:  Prepandial:   less than 140 mg/dL      Peak postprandial:   less than 180 mg/dL (1-2 hours)      Critically ill patients:  140 - 180 mg/dL   Results for Tamara Parrish, Tamara Parrish (MRN [id number]) as of 02/23/2018 08:49  Ref. Range 02/23/2018 07:33  Glucose-Capillary Latest Ref Range: 70 - 99 mg/dL 64 (L)     Home DM Meds: Basaglar 36 units QHS       Metformin 1000 mg BID  Current Orders: Lantus 28 units QHS       Novolog Sensitive Correction Scale/ SSI (0-9 units) TID AC + HS      Novolog 3 units TID with meals     MD- Patient with Hypoglycemia this AM after receiving 28 units Lantus last PM.  Please consider reducing Lantus further to 22 units QHS (20% reduction)     --Will follow patient during hospitalization--  Wyn Quaker RN, MSN, CDE Diabetes Coordinator Inpatient Glycemic Control Team Team Pager: [phone] (8a-5p)

## 2018-02-23 NOTE — Progress Notes (Signed)
Physical Therapy Treatment Patient Details Name: Tamara Parrish MRN: [id number] DOB: Mar 14, 1956 Today's Date: 02/23/2018    History of Present Illness Pt is a 62 year old female admitted with HCAP.  PMH includes RA, osteomyelitis and Htn.    PT Comments    Pt initially refuses PT; agreeable with gentle encouragement. Pt reports BP is low and c/o fatigue. Orthostatic BP checked as follows: lying 107/61, HR 76 and O2 on 2L supplemental O2 100%. Pt states she does not use O2 at home. O2 removed, seated: BP 115/72, HR 79 O2 96%. Standing BP 121/69, HR 88, O2 saturation 95%. Pt participates in seated and stand exercises with rests as needed; O2 saturation remains 93 to 95%. Pt ambulates to/from bathroom with supervision without AD. Pt manages hand hygiene and use of waste can using foot to operate foot pedal without support; no LOB. Pt remained on room air throughout. O2 saturation post all activity 89% and increases to 92% with seated rest. Pt placed back on 2L O2 at pt request; nursing notified. Pt did not wish further mobility at this time, or up in chair, but wished back to bed. Encouraged up in chair with staff supervision later in the day. Continue PT to progress strength and endurance to improve functional mobility tolerance to allow for an optimal, safe return home.   Follow Up Recommendations  Outpatient PT     Equipment Recommendations  None recommended by PT    Recommendations for Other Services       Precautions / Restrictions Precautions Precautions: Fall Restrictions Weight Bearing Restrictions: No    Mobility  Bed Mobility Overal bed mobility: Needs Assistance Bed Mobility: Supine to Sit;Sit to Supine     Supine to sit: Modified independent (Device/Increase time) Sit to supine: Min assist(for RLE to return to bed)   General bed mobility comments: Increased time/effort throughout. Extensive moaning/sighing. Irritated  Transfers Overall transfer level: Needs  assistance Equipment used: None Transfers: Sit to/from Stand Sit to Stand: Supervision         General transfer comment: Stands with good use of hands from/to bed and commode; no LOB  Ambulation/Gait Ambulation/Gait assistance: Supervision Gait Distance (Feet): 12 Feet(performed 2x) Assistive device: None Gait Pattern/deviations: Step-through pattern     General Gait Details: slow speed, no LOB. Performed without O2   Stairs             Wheelchair Mobility    Modified Rankin (Stroke Patients Only)       Balance Overall balance assessment: No apparent balance deficits (not formally assessed)                                          Cognition Arousal/Alertness: Awake/alert(c/o fatigue) Behavior During Therapy: WFL for tasks assessed/performed Overall Cognitive Status: Within Functional Limits for tasks assessed                                        Exercises General Exercises - Lower Extremity Long Arc Quad: AROM;Both;10 reps;Seated(2 sets) Hip ABduction/ADduction: Strengthening;Both;10 reps;Standing Straight Leg Raises: Strengthening;Both;10 reps;Standing Hip Flexion/Marching: Strengthening;Both;20 reps;Standing Toe Raises: AROM;Both;20 reps;Seated Heel Raises: AROM;Both;20 reps;Seated    General Comments        Pertinent Vitals/Pain Pain Assessment: No/denies pain    Home Living  Prior Function            PT Goals (current goals can now be found in the care plan section) Progress towards PT goals: Progressing toward goals    Frequency    Min 2X/week      PT Plan Current plan remains appropriate    Co-evaluation              AM-PAC PT "6 Clicks" Daily Activity  Outcome Measure  Difficulty turning over in bed (including adjusting bedclothes, sheets and blankets)?: None Difficulty moving from lying on back to sitting on the side of the bed? : None Difficulty  sitting down on and standing up from a chair with arms (e.g., wheelchair, bedside commode, etc,.)?: None Help needed moving to and from a bed to chair (including a wheelchair)?: None Help needed walking in hospital room?: None Help needed climbing 3-5 steps with a railing? : A Little 6 Click Score: 23    End of Session Equipment Utilized During Treatment: Gait belt Activity Tolerance: Other (comment);Patient tolerated treatment well(demonstrates self limitations) Patient left: in bed;with call bell/phone within reach;with bed alarm set;with family/visitor present;Other (comment)(O2 replaced)   PT Visit Diagnosis: Unsteadiness on feet (R26.81);Muscle weakness (generalized) (M62.81);Difficulty in walking, not elsewhere classified (R26.2)     Time: 2336-1224 PT Time Calculation (min) (ACUTE ONLY): 33 min  Charges:  $Gait Training: 8-22 mins $Therapeutic Exercise: 8-22 mins                    G Codes:        Larae Grooms, PTA 02/23/2018, 12:44 PM

## 2018-02-24 LAB — GLUCOSE, CAPILLARY
GLUCOSE-CAPILLARY: 272 mg/dL — AB (ref 70–99)
GLUCOSE-CAPILLARY: 189 mg/dL — AB (ref 70–99)

## 2018-02-24 MED ORDER — DOXYCYCLINE HYCLATE 100 MG PO TABS: 100.0000 mg | ORAL_TABLET | Freq: Two times a day (BID) | ORAL | 0 refills | Status: DC

## 2018-02-24 MED ORDER — DOXYCYCLINE HYCLATE 100 MG PO TABS: 100.0000 mg | ORAL_TABLET | Freq: Two times a day (BID) | ORAL | Status: DC

## 2018-02-24 MED ORDER — CEFDINIR 300 MG PO CAPS
300.0000 mg | ORAL_CAPSULE | Freq: Two times a day (BID) | ORAL | Status: DC
  Filled 2018-02-24 (×2): qty 1

## 2018-02-24 NOTE — Discharge Summary (Signed)
[place] [facility] [place] NAME: Tamara Parrish    MR#:  [id number]  DATE OF BIRTH:  August 31, 1955  DATE OF ADMISSION:  02/21/2018 ADMITTING PHYSICIAN: Gladstone Lighter, MD  DATE OF DISCHARGE: 02/24/2018  PRIMARY CARE PHYSICIAN: Glendon Axe, MD    ADMISSION DIAGNOSIS:  HCAP (healthcare-associated pneumonia) [J18.9] Failure of outpatient treatment [Z78.9]  DISCHARGE DIAGNOSIS:  Left UL mass vs consolidation/Pneumonia Acute on Chronic mild COPD flare--on home oxygen Chronic tobacco abuse--now quit  SECONDARY DIAGNOSIS:   Past Medical History:  Diagnosis Date  . DM2 (diabetes mellitus, type 2) ([place])   . Hyperlipemia   . Hypertension   . Osteomyelitis ([place])   . RA (rheumatoid Parrish) [facility])     HOSPITAL COURSE:  Tamara Parrish who was admitted to the hospital for community-acquired pneumonia 2 weeks ago was discharged with home oxygen and was sent back from PCPs office due to worsening hypoxia and x-ray findings.  1.left UL consolidation vs lung massworsening pneumonia in spite of treating with antibiotics recently. -Clinically feeling better Continue meropenem and discontinue vancomycin-MRSA PCR negative--change to po doxycycline Repeat blood cultures negative -CT scan with left upper lobe consolidation suspicious for malignancy, enlarged bilateral hilar and mediastinal lymph nodes.  -spoke with Dr Isidore Moos-- consider Bronch given CT chest finding and chronic smoking--pt will f/u with dr ram in few weeks with repeat CT chest and w/u thereafter if needed - Also revealing chronic interstitial lung disease -Continue supplemental oxygen  2. Diabetes mellitus-continue Lantus. Also sliding scale insulin. - Holdmetformin due to contrast received with CT--resume at discharge  3. Hypertension-on Norvasc, lisinopril,  metoprolol and triamterene/hydrochlorothiazide  4. COPD-stable. Continue inhalers. No indication for systemic steroids.  5. DVT prophylaxis-Lovenox  Physical therapy consult requested--recommends out pt PT  Overall better. Ok to go home  CONSULTS OBTAINED:  Treatment Team:  [name], MD Erby Pian, MD  DRUG ALLERGIES:   Allergies  Allergen Reactions  . Ibuprofen     Other reaction(s): Other (See Comments) Is not supposed to take because of her kidneys.  . Penicillins     Has patient had a PCN reaction causing immediate rash, facial/tongue/throat swelling, SOB or lightheadedness with hypotension: Unknown Has patient had a PCN reaction causing severe rash involving mucus membranes or skin necrosis: Unknown Has patient had a PCN reaction that required hospitalization: Unknown Has patient had a PCN reaction occurring within the last 10 years: Unknown If all of the above answers are "NO", then may proceed with Cephalosporin use.    DISCHARGE MEDICATIONS:   Allergies as of 02/24/2018      Reactions   Ibuprofen    Other reaction(s): Other (See Comments) Is not supposed to take because of her kidneys.   Penicillins    Has patient had a PCN reaction causing immediate rash, facial/tongue/throat swelling, SOB or lightheadedness with hypotension: Unknown Has patient had a PCN reaction causing severe rash involving mucus membranes or skin necrosis: Unknown Has patient had a PCN reaction that required hospitalization: Unknown Has patient had a PCN reaction occurring within the last 10 years: Unknown If all of the above answers are "NO", then may proceed with Cephalosporin use.      Medication List    STOP taking these medications   levofloxacin 500 MG tablet Commonly known as:  LEVAQUIN     TAKE these medications   albuterol 108 (90 Base) MCG/ACT inhaler Commonly known as:  PROVENTIL  HFA;VENTOLIN HFA Inhale 2 puffs into the lungs every 6 (six) hours  as needed for wheezing or shortness of breath.   ALPRAZolam 0.25 MG tablet Commonly known as:  XANAX Take 0.25 mg by mouth 2 (two) times daily as needed for anxiety.   amLODipine 5 MG tablet Commonly known as:  NORVASC Take 5 mg by mouth daily.   aspirin 81 MG chewable tablet Chew 1 tablet by mouth daily.   atorvastatin 40 MG tablet Commonly known as:  LIPITOR Take 40 mg by mouth daily.   BASAGLAR KWIKPEN 100 UNIT/ML Sopn Inject 36 Units into the skin at bedtime.   doxycycline 100 MG tablet Commonly known as:  VIBRA-TABS Take 1 tablet (100 mg total) by mouth every 12 (twelve) hours.   folic acid 1 MG tablet Commonly known as:  FOLVITE Take 1 mg by mouth daily.   guaiFENesin-dextromethorphan 100-10 MG/5ML syrup Commonly known as:  ROBITUSSIN DM Take 5 mLs by mouth every 4 (four) hours as needed for cough.   lisinopril 40 MG tablet Commonly known as:  PRINIVIL,ZESTRIL Take 40 mg by mouth daily.   metFORMIN 1000 MG tablet Commonly known as:  GLUCOPHAGE Take 1,000 mg by mouth 2 (two) times daily.   metoprolol tartrate 25 MG tablet Commonly known as:  LOPRESSOR Take 25 mg by mouth 2 (two) times daily.   ondansetron 4 MG disintegrating tablet Commonly known as:  ZOFRAN-ODT Take 4 mg by mouth every 8 (eight) hours as needed for nausea.   triamterene-hydrochlorothiazide 37.5-25 MG capsule Commonly known as:  DYAZIDE Take 1 capsule by mouth daily.       If you experience worsening of your admission symptoms, develop shortness of breath, life threatening emergency, suicidal or homicidal thoughts you must seek medical attention immediately by calling 911 or calling your MD immediately  if symptoms less severe.  You Must read complete instructions/literature along with all the possible adverse reactions/side effects for all the Medicines you take and that have been prescribed to you. Take any new Medicines after you have completely understood and accept all the possible  adverse reactions/side effects.   Please note  You were cared for by a hospitalist during your hospital stay. If you have any questions about your discharge medications or the care you received while you were in the hospital after you are discharged, you can call the unit and asked to speak with the hospitalist on call if the hospitalist that took care of you is not available. Once you are discharged, your primary care physician will handle any further medical issues. Please note that NO REFILLS for any discharge medications will be authorized once you are discharged, as it is imperative that you return to your primary care physician (or establish a relationship with a primary care physician if you do not have one) for your aftercare needs so that they can reassess your need for medications and monitor your lab values. Today   SUBJECTIVE   No new complaints  VITAL SIGNS:  Blood pressure 114/69, pulse 96, temperature 98 F (36.7 C), temperature source Oral, resp. rate (!) 22, height 5\' 5"  (1.651 m), weight 79.6 kg (175 lb 8 oz), SpO2 99 %.  I/O:    Intake/Output Summary (Last 24 hours) at 02/24/2018 1124 Last data filed at 02/24/2018 0500 Gross per 24 hour  Intake 655 ml  Output -  Net 655 ml    PHYSICAL EXAMINATION:  GENERAL:  62 y.o.-year-old patient lying in the bed with no acute distress.  EYES: Pupils equal, round, reactive to light and accommodation. No scleral icterus. Extraocular muscles intact.  HEENT: Head atraumatic, normocephalic. Oropharynx and nasopharynx clear.  NECK:  Supple, no jugular venous distention. No thyroid enlargement, no tenderness.  LUNGS: Normal breath sounds bilaterally, no wheezing, rales,rhonchi or crepitation. No use of accessory muscles of respiration.  CARDIOVASCULAR: S1, S2 normal. No murmurs, rubs, or gallops.  ABDOMEN: Soft, non-tender, non-distended. Bowel sounds present. No organomegaly or mass.  EXTREMITIES: No pedal edema, cyanosis, or clubbing.   NEUROLOGIC: Cranial nerves II through XII are intact. Muscle strength 5/5 in all extremities. Sensation intact. Gait not checked.  PSYCHIATRIC: The patient is alert and oriented x 3.  SKIN: No obvious rash, lesion, or ulcer.   DATA REVIEW:   CBC  Recent Labs  Lab 02/23/18 0418  WBC 11.2*  HGB 9.2*  HCT 27.7*  PLT 578*    Chemistries  Recent Labs  Lab 02/21/18 1835 02/22/18 0459  NA 135 137  K 4.6 4.5  CL 101 104  CO2 25 27  GLUCOSE 198* 63*  BUN 23 22  CREATININE 1.06* 0.72  CALCIUM 9.1 9.0  AST 20  --   ALT 6  --   ALKPHOS 83  --   BILITOT 0.6  --     Microbiology Results   Recent Results (from the past 240 hour(s))  Blood Culture (routine x 2)     Status: None (Preliminary result)   Collection Time: 02/21/18  6:35 PM  Result Value Ref Range Status   Specimen Description BLOOD RIGHT ANTECUBITAL  Final   Special Requests   Final    BOTTLES DRAWN AEROBIC AND ANAEROBIC Blood Culture adequate volume   Culture   Final    NO GROWTH 3 DAYS Performed at [facility], [address], [place], [place] [address]    Report Status PENDING  Incomplete  Blood Culture (routine x 2)     Status: None (Preliminary result)   Collection Time: 02/21/18  6:35 PM  Result Value Ref Range Status   Specimen Description BLOOD BLOOD RIGHT FOREARM  Final   Special Requests   Final    BOTTLES DRAWN AEROBIC AND ANAEROBIC Blood Culture adequate volume   Culture   Final    NO GROWTH 3 DAYS Performed at [facility], [address], [place], [place] [address]    Report Status PENDING  Incomplete  MRSA PCR Screening     Status: None   Collection Time: 02/21/18  8:39 PM  Result Value Ref Range Status   MRSA by PCR NEGATIVE NEGATIVE Final    Comment:        The GeneXpert MRSA Assay (FDA approved for NASAL specimens only), is one component of a comprehensive MRSA colonization surveillance program. It is not intended to diagnose MRSA infection nor to guide  or monitor treatment for MRSA infections. Performed at [facility], [address], [place], [place] [address]     RADIOLOGY:  No results found.   Management plans discussed with the patient, family and they are in agreement.  CODE STATUS:     Code Status Orders  (From admission, onward)        Start     Ordered   02/21/18 2005  Full code  Continuous     02/21/18 2004    Code Status History    Date Active Date Inactive Code Status Order ID Comments User Context   02/07/2018 2202 02/11/2018 1738 Full Code [id number]  Dustin Flock, MD Inpatient      TOTAL TIME TAKING CARE OF THIS PATIENT: 40 minutes.    Fritzi Mandes M.D on 02/24/2018 at 11:24 AM  Between 7am to 6pm - Pager - [phone] After 6pm go to [email or web address] - password [email or web address] [facility]  Office  [phone]  CC: Primary care physician; Glendon Axe, MD

## 2018-02-24 NOTE — Care Management Note (Signed)
Case Management Note  Patient Details  Name: Tamara Parrish MRN: [id number] Date of Birth: [birth year]  Subjective/Objective: Requested resumption of care orders from hospitalist. Added PT to services. [name] with [facility] notified of discharge.                    Action/Plan:   Expected Discharge Date:  02/24/18               Expected Discharge Plan:  [place]  In-House Referral:     Discharge planning Services  CM Consult  Post Acute Care Choice:  Resumption of Svcs/PTA Provider, Home Health Choice offered to:  Patient  DME Arranged:    DME Agency:     HH Arranged:  RN, PT [place] Agency:  [place]  Status of Service:  Completed, signed off  If discussed at [place] of Stay Meetings, dates discussed:    Additional Comments:  Jolly Mango, RN 02/24/2018, 12:10 PM

## 2018-02-24 NOTE — Progress Notes (Signed)
Patient discharged home with discharge instructions explained to patient and her daughter.  No questions and patient happy with her visit.

## 2018-02-25 ENCOUNTER — Telehealth: Payer: Self-pay | Admitting: Internal Medicine

## 2018-02-25 ENCOUNTER — Other Ambulatory Visit: Payer: Self-pay | Admitting: Internal Medicine

## 2018-02-25 DIAGNOSIS — R911 Solitary pulmonary nodule: Secondary | ICD-10-CM

## 2018-02-25 NOTE — Telephone Encounter (Signed)
Lmov for patient to call and reschedule appointment  Pt needs 5w fu not 3 week   Will try again at a later time

## 2018-02-25 NOTE — Telephone Encounter (Signed)
Pt has been rescheduled to Aug 15th   Nothing else needed.

## 2018-02-25 NOTE — Telephone Encounter (Signed)
-----   Message from Stephanie Coup, [place] sent at 02/25/2018  2:56 PM EDT ----- Please schedule follow up after CT done in 5 weeks.

## 2018-02-26 LAB — CULTURE, BLOOD (ROUTINE X 2)
CULTURE: NO GROWTH
Culture: NO GROWTH
Special Requests: ADEQUATE
Special Requests: ADEQUATE

## 2018-03-16 ENCOUNTER — Inpatient Hospital Stay: Payer: BLUE CROSS/BLUE SHIELD | Admitting: Internal Medicine

## 2018-04-03 NOTE — Progress Notes (Signed)
* [facility]     Assessment and Plan:  Masslike infiltrate of left upper lobe with mediastinal lymphadenopathy.  Suspicious for malignancy. Pulmonary fibrosis, likely related to underlying rheumatoid arthritis. History of smoking, recently quit.  -Patient was supposed to have a repeat CT chest before this follow-up, but it was not covered by insurance.  I will order a repeat CT chest due to non-resolving pneumonia/masslike consolidation in 1 month and follow-up after that. - Advance activity as tolerated, she is given a referral to pulmonary rehab.  She is given a return to work note, she is asked to advance activity as tolerated. - She can continue to use oxygen with activity at home, however her baseline oxygen saturation at rest is normal, therefore I do not think she requires ambulatory oxygen at work.  Orders Placed This Encounter  Procedures  . CT Chest W Contrast  . AMB referral to pulmonary rehabilitation   Return in about 1 month (around 05/05/2018).   Date: 04/03/2018  MRN# [id number] Tamara Parrish May 03, 1956   Tamara Parrish is a 62 y.o. old female seen in follow up for chief complaint of  Chief Complaint  Patient presents with  . Hospitalization Follow-up    pt here for pneumonia f/u states she is feeling better but has sob with exertion and some non productive cough.     HPI:  The patient is a 62 year old female with a history of hypertension, hyperlipidemia, rheumatoid arthritis.  She was admitted recently with pneumonia with sepsis from 6/17 to 02/11/2018 and then readmitted due to persistent pneumonia. She was discharged on oxygen at 2L, she is not using it much now as she feels that she is doing much better. Her breathing is doing much better, cough is minimal, non productive. No hemoptysis.  She is stopped smoking about 4 months ago.   **CT chest 02/21/2018, in comparison with recent chest x-rays>> imaging personally reviewed, there  are fibrotic changes of the lungs bibasilarly, greatest in the left, these are also seen in the lingula peripherally, right apex. There is masslike consolidation in the apical posterior segment of the left upper lobe.  There is significant lymphadenopathy seen in the right hilar, subcarinal and right paratracheal area.  There is a pulmonary arterial enlargement suggestive of pulmonary hypertension. Opacification of the left upper lobe appears advanced in comparison to previous chest x-ray 2 weeks prior  Desat walk 04/04/18>> At rest on RA sat was 95% and HR 88. Pt walked 360 feet, mild dyspnea. Slow pace. Sat was 88% and HR 102.   Medication:    Current Outpatient Medications:  .  albuterol (PROVENTIL HFA;VENTOLIN HFA) 108 (90 Base) MCG/ACT inhaler, Inhale 2 puffs into the lungs every 6 (six) hours as needed for wheezing or shortness of breath., Disp: 1 Inhaler, Rfl: 2 .  ALPRAZolam (XANAX) 0.25 MG tablet, Take 0.25 mg by mouth 2 (two) times daily as needed for anxiety. , Disp: , Rfl:  .  amLODipine (NORVASC) 5 MG tablet, Take 5 mg by mouth daily., Disp: , Rfl: 3 .  aspirin 81 MG chewable tablet, Chew 1 tablet by mouth daily., Disp: , Rfl:  .  atorvastatin (LIPITOR) 40 MG tablet, Take 40 mg by mouth daily., Disp: , Rfl: 3 .  doxycycline (VIBRA-TABS) 100 MG tablet, Take 1 tablet (100 mg total) by mouth every 12 (twelve) hours., Disp: 12 tablet, Rfl: 0 .  folic acid (FOLVITE) 1 MG tablet, Take 1 mg by mouth daily., Disp: ,  Rfl: 11 .  guaiFENesin-dextromethorphan (ROBITUSSIN DM) 100-10 MG/5ML syrup, Take 5 mLs by mouth every 4 (four) hours as needed for cough., Disp: 118 mL, Rfl: 0 .  Insulin Glargine (BASAGLAR KWIKPEN) 100 UNIT/ML SOPN, Inject 36 Units into the skin at bedtime., Disp: , Rfl: 12 .  lisinopril (PRINIVIL,ZESTRIL) 40 MG tablet, Take 40 mg by mouth daily., Disp: , Rfl: 3 .  metFORMIN (GLUCOPHAGE) 1000 MG tablet, Take 1,000 mg by mouth 2 (two) times daily., Disp: , Rfl: 5 .  metoprolol  tartrate (LOPRESSOR) 25 MG tablet, Take 25 mg by mouth 2 (two) times daily., Disp: , Rfl: 3 .  ondansetron (ZOFRAN-ODT) 4 MG disintegrating tablet, Take 4 mg by mouth every 8 (eight) hours as needed for nausea. , Disp: , Rfl:  .  triamterene-hydrochlorothiazide (DYAZIDE) 37.5-25 MG capsule, Take 1 capsule by mouth daily., Disp: , Rfl: 3   Allergies:  Ibuprofen and Penicillins   Review of Systems:  Constitutional: Feels well. Cardiovascular: No chest pain.  Pulmonary: Denies dyspnea.   The remainder of systems were reviewed and were found to be negative other than what is documented in the HPI.    Physical Examination:   VS: BP (!) 146/82 (BP Location: Left Arm, Cuff Size: Normal)   Pulse 85   Resp 16   Ht 5\' 5"  (1.651 m)   Wt 175 lb (79.4 kg)   SpO2 93%   BMI 29.12 kg/m   General Appearance: No distress  Neuro:without focal findings, mental status, speech normal, alert and oriented HEENT: PERRLA, EOM intact Pulmonary: No wheezing, No rales  CardiovascularNormal S1,S2.  No m/r/g.  Abdomen: Benign, Soft, non-tender, No masses Renal:  No costovertebral tenderness  GU:  No performed at this time. Endoc: No evident thyromegaly, no signs of acromegaly or Cushing features Skin:   warm, no rashes, no ecchymosis  Extremities: normal, no cyanosis, clubbing.    LABORATORY PANEL:   CBC No results for input(s): WBC, HGB, HCT, PLT in the last 168 hours. ------------------------------------------------------------------------------------------------------------------  Chemistries  No results for input(s): NA, K, CL, CO2, GLUCOSE, BUN, CREATININE, CALCIUM, MG, AST, ALT, ALKPHOS, BILITOT in the last 168 hours.  Invalid input(s): GFRCGP ------------------------------------------------------------------------------------------------------------------  Cardiac Enzymes No results for input(s): TROPONINI in the last 168  hours. ------------------------------------------------------------  RADIOLOGY:   No results found for this or any previous visit. Results for orders placed during the hospital encounter of 02/07/18  DG Chest 2 View   Narrative CLINICAL DATA:  Shortness of breath and weakness, history of tobacco use  EXAM: CHEST - 2 VIEW  COMPARISON:  02/12/2014  FINDINGS: Cardiac shadow is within normal limits. The lungs are well aerated bilaterally with diffuse interstitial changes. A pleural-based wedge-shaped density is noted in the left upper lobe. Given the short-term history this likely represents an acute on chronic infiltrate. Patchy basilar infiltrate on the left is noted as well. No sizable effusion is seen. No acute bony abnormality is seen.  IMPRESSION: Changes most consistent with multifocal pneumonia. Followup PA and lateral chest X-ray is recommended in 3-4 weeks following trial of antibiotic therapy to ensure resolution and exclude underlying malignancy.   Electronically Signed   By: Inez Catalina M.D.   On: 02/07/2018 19:20    ------------------------------------------------------------------------------------------------------------------  Thank  you for allowing [facility] to assist in the care of your patient. Our recommendations are noted above.  Please contact us if we can be of further service.   Marda Stalker, M.D., F.C.C.P.  Board Certified  in Internal Medicine, Pulmonary Medicine, Critical Care Medicine, and Sleep Medicine.  [facility] Office Number: [phone]  04/03/2018

## 2018-04-04 ENCOUNTER — Ambulatory Visit: Payer: BLUE CROSS/BLUE SHIELD | Admitting: Internal Medicine

## 2018-04-04 ENCOUNTER — Encounter: Payer: Self-pay | Admitting: Internal Medicine

## 2018-04-04 ENCOUNTER — Encounter: Payer: Self-pay | Admitting: *Deleted

## 2018-04-04 VITALS — BP 146/82 | HR 85 | Resp 16 | Ht 65.0 in | Wt 175.0 lb

## 2018-04-04 DIAGNOSIS — R0602 Shortness of breath: Secondary | ICD-10-CM | POA: Diagnosis not present

## 2018-04-04 DIAGNOSIS — J449 Chronic obstructive pulmonary disease, unspecified: Secondary | ICD-10-CM | POA: Diagnosis not present

## 2018-04-04 NOTE — Patient Instructions (Addendum)
Will repeat Ct chest in 1 month Will refer to pulmonary rehab.   You may return to work and advance activity as tolerated, do not push yourself too hard.

## 2018-04-28 ENCOUNTER — Other Ambulatory Visit: Payer: Self-pay | Admitting: *Deleted

## 2018-04-28 DIAGNOSIS — J189 Pneumonia, unspecified organism: Secondary | ICD-10-CM

## 2018-05-04 NOTE — H&P (View-Only) (Signed)
* [facility]     Assessment and Plan: Masslike infiltrate of left upper lobe with mediastinal lymphadenopathy, suspicious for malignancy. Pulmonary fibrosis, likely related to underlying rheumatoid arthritis. History of smoking, recently quit.  - Discussed with patient and family, will plan for EBUS bronchoscopy with transbronchial biopsies and fluoroscopic guidance of left upper lobe mass.  Orders Placed This Encounter  Procedures  . DG C-Arm 1-60 Min-No Report   Return in about 1 week (around 05/13/2018) for after bronchoscopy.   Date: 05/04/2018  MRN# [id number] Tamara Parrish 05-27-1956   Tamara Parrish is a 61 y.o. old female seen in follow up for chief complaint of  Chief Complaint  Patient presents with  . Follow-up    pt here to f/u with results of CT scan.  . Shortness of Breath    2 Liters 02 qhs and prn daytime  . Cough    non productive     HPI:  The patient is a 62 year old female with a history of hypertension, hyperlipidemia, rheumatoid arthritis.  She was admitted recently with pneumonia with sepsis from 6/17 to 02/11/2018 and then readmitted due to persistent pneumonia.  Patient's initial repeat follow-up CT was not covered by insurance, therefore follow-up CT was delayed by 1 month. Since her last visit she remains on ventolin, she feels that her breathing is ok, she has no weight loss, occasional, no hemoptysis.  Denies fever or chills.   **CT chest  05/05/2018 in comparison with previous 02/21/2018,>>  imaging personally reviewed.  The left lower lobe and right upper lobe fibrotic changes have improved.  However there is persistent masslike consolidation of the left upper lobe there are fibrotic changes of the lungs bibasilarly, greatest in the left, these are also seen in the lingula peripherally, right apex. There is masslike consolidation in the apical posterior segment of the left upper lobe.  There remains significant  lymphadenopathy seen in the right hilar, subcarinal and right paratracheal area.  There is a pulmonary arterial enlargement suggestive of pulmonary hypertension. Opacification of the left upper lobe appears advanced in comparison to previous chest x-ray 2 weeks prior  Desat walk 04/04/18>> At rest on RA sat was 95% and HR 88. Pt walked 360 feet, mild dyspnea. Slow pace. Sat was 88% and HR 102.   Medication:    Current Outpatient Medications:  .  albuterol (PROVENTIL HFA;VENTOLIN HFA) 108 (90 Base) MCG/ACT inhaler, Inhale 2 puffs into the lungs every 6 (six) hours as needed for wheezing or shortness of breath., Disp: 1 Inhaler, Rfl: 2 .  ALPRAZolam (XANAX) 0.25 MG tablet, Take 0.25 mg by mouth 2 (two) times daily as needed for anxiety. , Disp: , Rfl:  .  amLODipine (NORVASC) 5 MG tablet, Take 5 mg by mouth daily., Disp: , Rfl: 3 .  aspirin 81 MG chewable tablet, Chew 1 tablet by mouth daily., Disp: , Rfl:  .  atorvastatin (LIPITOR) 40 MG tablet, Take 40 mg by mouth daily., Disp: , Rfl: 3 .  folic acid (FOLVITE) 1 MG tablet, Take 1 mg by mouth daily., Disp: , Rfl: 11 .  guaiFENesin-dextromethorphan (ROBITUSSIN DM) 100-10 MG/5ML syrup, Take 5 mLs by mouth every 4 (four) hours as needed for cough., Disp: 118 mL, Rfl: 0 .  Insulin Glargine (BASAGLAR KWIKPEN) 100 UNIT/ML SOPN, Inject 36 Units into the skin at bedtime., Disp: , Rfl: 12 .  lisinopril (PRINIVIL,ZESTRIL) 40 MG tablet, Take 40 mg by mouth daily., Disp: , Rfl: 3 .  metFORMIN (GLUCOPHAGE) 1000 MG tablet, Take 1,000 mg by mouth 2 (two) times daily., Disp: , Rfl: 5 .  metoprolol tartrate (LOPRESSOR) 25 MG tablet, Take 25 mg by mouth 2 (two) times daily., Disp: , Rfl: 3 .  ondansetron (ZOFRAN-ODT) 4 MG disintegrating tablet, Take 4 mg by mouth every 8 (eight) hours as needed for nausea. , Disp: , Rfl:  .  triamterene-hydrochlorothiazide (DYAZIDE) 37.5-25 MG capsule, Take 1 capsule by mouth daily., Disp: , Rfl: 3   Allergies:  Ibuprofen and  Penicillins   Review of Systems:  Constitutional: Feels well. Cardiovascular: No chest pain.  Pulmonary: Denies hemoptysis.  The remainder of systems were reviewed and were found to be negative other than what is documented in the HPI.   Physical Examination:   VS: BP 128/78 (BP Location: Left Arm, Cuff Size: Normal)   Pulse 79   Resp 16   Ht 5\' 7"  (1.702 m)   Wt 178 lb (80.7 kg)   SpO2 97%   BMI 27.88 kg/m   General Appearance: No distress  Neuro:without focal findings, mental status, speech normal, alert and oriented HEENT: PERRLA, EOM intact Pulmonary: decreased air entry in left lung.  CardiovascularNormal S1,S2.  No m/r/g.  Abdomen: Benign, Soft, non-tender, No masses Renal:  No costovertebral tenderness  GU:  No performed at this time. Endoc: No evident thyromegaly, no signs of acromegaly or Cushing features Skin:   warm, no rashes, no ecchymosis  Extremities: normal, no cyanosis, clubbing.      LABORATORY PANEL:   CBC No results for input(s): WBC, HGB, HCT, PLT in the last 168 hours. ------------------------------------------------------------------------------------------------------------------  Chemistries  No results for input(s): NA, K, CL, CO2, GLUCOSE, BUN, CREATININE, CALCIUM, MG, AST, ALT, ALKPHOS, BILITOT in the last 168 hours.  Invalid input(s): GFRCGP ------------------------------------------------------------------------------------------------------------------  Cardiac Enzymes No results for input(s): TROPONINI in the last 168 hours. ------------------------------------------------------------  RADIOLOGY:   No results found for this or any previous visit. Results for orders placed during the hospital encounter of 02/07/18  DG Chest 2 View   Narrative CLINICAL DATA:  Shortness of breath and weakness, history of tobacco use  EXAM: CHEST - 2 VIEW  COMPARISON:  02/12/2014  FINDINGS: Cardiac shadow is within normal limits. The lungs  are well aerated bilaterally with diffuse interstitial changes. A pleural-based wedge-shaped density is noted in the left upper lobe. Given the short-term history this likely represents an acute on chronic infiltrate. Patchy basilar infiltrate on the left is noted as well. No sizable effusion is seen. No acute bony abnormality is seen.  IMPRESSION: Changes most consistent with multifocal pneumonia. Followup PA and lateral chest X-ray is recommended in 3-4 weeks following trial of antibiotic therapy to ensure resolution and exclude underlying malignancy.   Electronically Signed   By: Inez Catalina M.D.   On: 02/07/2018 19:20    ------------------------------------------------------------------------------------------------------------------  Thank  you for allowing [facility] to assist in the care of your patient. Our recommendations are noted above.  Please contact us if we can be of further service.   Marda Stalker, M.D., F.C.C.P.  Board Certified in Internal Medicine, Pulmonary Medicine, [place], and Sleep Medicine.  [facility] Office Number: [phone]  05/04/2018

## 2018-05-04 NOTE — Progress Notes (Signed)
* [facility]     Assessment and Plan: Masslike infiltrate of left upper lobe with mediastinal lymphadenopathy, suspicious for malignancy. Pulmonary fibrosis, likely related to underlying rheumatoid arthritis. History of smoking, recently quit.  - Discussed with patient and family, will plan for EBUS bronchoscopy with transbronchial biopsies and fluoroscopic guidance of left upper lobe mass.  Orders Placed This Encounter  Procedures  . DG C-Arm 1-60 Min-No Report   Return in about 1 week (around 05/13/2018) for after bronchoscopy.   Date: 05/04/2018  MRN# [id number] Tamara Parrish 02/22/1956   Tamara Parrish is a 62 y.o. old female seen in follow up for chief complaint of  Chief Complaint  Patient presents with  . Follow-up    pt here to f/u with results of CT scan.  . Shortness of Breath    2 Liters 02 qhs and prn daytime  . Cough    non productive     HPI:  The patient is a 62 year old female with a history of hypertension, hyperlipidemia, rheumatoid arthritis.  She was admitted recently with pneumonia with sepsis from 6/17 to 02/11/2018 and then readmitted due to persistent pneumonia.  Patient's initial repeat follow-up CT was not covered by insurance, therefore follow-up CT was delayed by 1 month. Since her last visit she remains on ventolin, she feels that her breathing is ok, she has no weight loss, occasional, no hemoptysis.  Denies fever or chills.   **CT chest  05/05/2018 in comparison with previous 02/21/2018,>>  imaging personally reviewed.  The left lower lobe and right upper lobe fibrotic changes have improved.  However there is persistent masslike consolidation of the left upper lobe there are fibrotic changes of the lungs bibasilarly, greatest in the left, these are also seen in the lingula peripherally, right apex. There is masslike consolidation in the apical posterior segment of the left upper lobe.  There remains significant  lymphadenopathy seen in the right hilar, subcarinal and right paratracheal area.  There is a pulmonary arterial enlargement suggestive of pulmonary hypertension. Opacification of the left upper lobe appears advanced in comparison to previous chest x-ray 2 weeks prior  Desat walk 04/04/18>> At rest on RA sat was 95% and HR 88. Pt walked 360 feet, mild dyspnea. Slow pace. Sat was 88% and HR 102.   Medication:    Current Outpatient Medications:  .  albuterol (PROVENTIL HFA;VENTOLIN HFA) 108 (90 Base) MCG/ACT inhaler, Inhale 2 puffs into the lungs every 6 (six) hours as needed for wheezing or shortness of breath., Disp: 1 Inhaler, Rfl: 2 .  ALPRAZolam (XANAX) 0.25 MG tablet, Take 0.25 mg by mouth 2 (two) times daily as needed for anxiety. , Disp: , Rfl:  .  amLODipine (NORVASC) 5 MG tablet, Take 5 mg by mouth daily., Disp: , Rfl: 3 .  aspirin 81 MG chewable tablet, Chew 1 tablet by mouth daily., Disp: , Rfl:  .  atorvastatin (LIPITOR) 40 MG tablet, Take 40 mg by mouth daily., Disp: , Rfl: 3 .  folic acid (FOLVITE) 1 MG tablet, Take 1 mg by mouth daily., Disp: , Rfl: 11 .  guaiFENesin-dextromethorphan (ROBITUSSIN DM) 100-10 MG/5ML syrup, Take 5 mLs by mouth every 4 (four) hours as needed for cough., Disp: 118 mL, Rfl: 0 .  Insulin Glargine (BASAGLAR KWIKPEN) 100 UNIT/ML SOPN, Inject 36 Units into the skin at bedtime., Disp: , Rfl: 12 .  lisinopril (PRINIVIL,ZESTRIL) 40 MG tablet, Take 40 mg by mouth daily., Disp: , Rfl: 3 .  metFORMIN (GLUCOPHAGE) 1000 MG tablet, Take 1,000 mg by mouth 2 (two) times daily., Disp: , Rfl: 5 .  metoprolol tartrate (LOPRESSOR) 25 MG tablet, Take 25 mg by mouth 2 (two) times daily., Disp: , Rfl: 3 .  ondansetron (ZOFRAN-ODT) 4 MG disintegrating tablet, Take 4 mg by mouth every 8 (eight) hours as needed for nausea. , Disp: , Rfl:  .  triamterene-hydrochlorothiazide (DYAZIDE) 37.5-25 MG capsule, Take 1 capsule by mouth daily., Disp: , Rfl: 3   Allergies:  Ibuprofen and  Penicillins   Review of Systems:  Constitutional: Feels well. Cardiovascular: No chest pain.  Pulmonary: Denies hemoptysis.  The remainder of systems were reviewed and were found to be negative other than what is documented in the HPI.   Physical Examination:   VS: BP 128/78 (BP Location: Left Arm, Cuff Size: Normal)   Pulse 79   Resp 16   Ht 5\' 7"  (1.702 m)   Wt 178 lb (80.7 kg)   SpO2 97%   BMI 27.88 kg/m   General Appearance: No distress  Neuro:without focal findings, mental status, speech normal, alert and oriented HEENT: PERRLA, EOM intact Pulmonary: decreased air entry in left lung.  CardiovascularNormal S1,S2.  No m/r/g.  Abdomen: Benign, Soft, non-tender, No masses Renal:  No costovertebral tenderness  GU:  No performed at this time. Endoc: No evident thyromegaly, no signs of acromegaly or Cushing features Skin:   warm, no rashes, no ecchymosis  Extremities: normal, no cyanosis, clubbing.      LABORATORY PANEL:   CBC No results for input(s): WBC, HGB, HCT, PLT in the last 168 hours. ------------------------------------------------------------------------------------------------------------------  Chemistries  No results for input(s): NA, K, CL, CO2, GLUCOSE, BUN, CREATININE, CALCIUM, MG, AST, ALT, ALKPHOS, BILITOT in the last 168 hours.  Invalid input(s): GFRCGP ------------------------------------------------------------------------------------------------------------------  Cardiac Enzymes No results for input(s): TROPONINI in the last 168 hours. ------------------------------------------------------------  RADIOLOGY:   No results found for this or any previous visit. Results for orders placed during the hospital encounter of 02/07/18  DG Chest 2 View   Narrative CLINICAL DATA:  Shortness of breath and weakness, history of tobacco use  EXAM: CHEST - 2 VIEW  COMPARISON:  02/12/2014  FINDINGS: Cardiac shadow is within normal limits. The lungs  are well aerated bilaterally with diffuse interstitial changes. A pleural-based wedge-shaped density is noted in the left upper lobe. Given the short-term history this likely represents an acute on chronic infiltrate. Patchy basilar infiltrate on the left is noted as well. No sizable effusion is seen. No acute bony abnormality is seen.  IMPRESSION: Changes most consistent with multifocal pneumonia. Followup PA and lateral chest X-ray is recommended in 3-4 weeks following trial of antibiotic therapy to ensure resolution and exclude underlying malignancy.   Electronically Signed   By: Inez Catalina M.D.   On: 02/07/2018 19:20    ------------------------------------------------------------------------------------------------------------------  Thank  you for allowing [facility] to assist in the care of your patient. Our recommendations are noted above.  Please contact us if we can be of further service.   Marda Stalker, M.D., F.C.C.P.  Board Certified in Internal Medicine, Pulmonary Medicine, [place], and Sleep Medicine.  [facility] Office Number: [phone]  05/04/2018

## 2018-05-05 ENCOUNTER — Ambulatory Visit
Admission: RE | Admit: 2018-05-05 | Discharge: 2018-05-05 | Disposition: A | Discharge status: Home / Self Care | Payer: BLUE CROSS/BLUE SHIELD | Source: Ambulatory Visit | Attending: Internal Medicine | Admitting: Internal Medicine

## 2018-05-05 DIAGNOSIS — I7789 Other specified disorders of arteries and arterioles: Secondary | ICD-10-CM | POA: Diagnosis not present

## 2018-05-05 DIAGNOSIS — J438 Other emphysema: Secondary | ICD-10-CM | POA: Insufficient documentation

## 2018-05-05 DIAGNOSIS — R918 Other nonspecific abnormal finding of lung field: Secondary | ICD-10-CM | POA: Insufficient documentation

## 2018-05-05 DIAGNOSIS — I700 Atherosclerosis of aorta: Secondary | ICD-10-CM | POA: Diagnosis not present

## 2018-05-05 DIAGNOSIS — J432 Centrilobular emphysema: Secondary | ICD-10-CM | POA: Diagnosis not present

## 2018-05-05 DIAGNOSIS — R0602 Shortness of breath: Secondary | ICD-10-CM

## 2018-05-05 DIAGNOSIS — R590 Localized enlarged lymph nodes: Secondary | ICD-10-CM | POA: Insufficient documentation

## 2018-05-05 MED ORDER — IOPAMIDOL (ISOVUE-300) INJECTION 61%
100.0000 mL | Freq: Once | INTRAVENOUS | Status: AC | PRN
  Administered 2018-05-05: 75 mL via INTRAVENOUS

## 2018-05-06 ENCOUNTER — Telehealth: Payer: Self-pay | Admitting: Internal Medicine

## 2018-05-06 ENCOUNTER — Ambulatory Visit: Payer: BLUE CROSS/BLUE SHIELD | Admitting: Internal Medicine

## 2018-05-06 VITALS — BP 128/78 | HR 79 | Resp 16 | Ht 67.0 in | Wt 178.0 lb

## 2018-05-06 DIAGNOSIS — J449 Chronic obstructive pulmonary disease, unspecified: Secondary | ICD-10-CM | POA: Diagnosis not present

## 2018-05-06 DIAGNOSIS — R918 Other nonspecific abnormal finding of lung field: Secondary | ICD-10-CM | POA: Diagnosis not present

## 2018-05-06 DIAGNOSIS — J189 Pneumonia, unspecified organism: Secondary | ICD-10-CM

## 2018-05-06 NOTE — Patient Instructions (Addendum)
Will plan for bronchoscopy for biopsy of your lung.   Please call us back with a list of medications that you are taking to determine if you are taking a blood thinner.

## 2018-05-06 NOTE — Telephone Encounter (Signed)
Tamara Parrish with [facility] calling States they have CT results for patient ready  Please call [phone]

## 2018-05-06 NOTE — Telephone Encounter (Signed)
Tried to call Tamara Parrish back but phone kept ringing. Forwarded message to MD.   Please see pt's CT results.

## 2018-05-06 NOTE — Telephone Encounter (Signed)
Pt is returning your call

## 2018-05-06 NOTE — Telephone Encounter (Signed)
Pt returned my call and was given pre-assessment appt 05/11/18 at 2:45 and bronch 05/13/18 report to medical mall at 12 noon, NPO after midnight night before, must have a driver present.

## 2018-05-11 ENCOUNTER — Encounter
Admission: RE | Admit: 2018-05-11 | Discharge: 2018-05-11 | Disposition: A | Discharge status: Home / Self Care | Payer: BLUE CROSS/BLUE SHIELD | Source: Ambulatory Visit | Attending: Internal Medicine | Admitting: Internal Medicine

## 2018-05-11 ENCOUNTER — Other Ambulatory Visit: Payer: Self-pay

## 2018-05-11 HISTORY — DX: Dyspnea, unspecified: R06.00

## 2018-05-11 HISTORY — DX: Pneumonia, unspecified organism: J18.9

## 2018-05-11 NOTE — Patient Instructions (Signed)
  Your procedure is scheduled on: Friday May 13, 2018 Report to Same Day Surgery 2nd floor medical mall ([facility] Entr[facility]-take elevator on left to 2nd floor.  Check in with surgery information desk.) To find out your arrival time please call [phone] between 1PM - 3PM on Thursday 19, 2019  Remember: Instructions that are not followed completely may result in serious medical risk, up to and including death, or upon the discretion of your surgeon and anesthesiologist your surgery may need to be rescheduled.    _x___ 1. Do not eat food (including mints, candies, chewing gum) after midnight the night before your procedure. You may drink water up to 2 hours before you are scheduled to arrive at the hospital for your procedure.  Do not drink anything within 2 hours of your scheduled arrival to the hospital.   __x__ 2. No Alcohol for 24 hours before or after surgery.   __x__ 3. No Smoking or e-cigarettes for 24 prior to surgery.  Do not use any chewable tobacco products for at least 6 hour prior to surgery   __x__ 4. Notify your doctor if there is any change in your medical condition (cold, fever, infections).   __x__ 5. On the morning of surgery brush your teeth with toothpaste and water.  You may rinse your mouth with mouth wash if you wish.  Do not swallow any toothpaste or mouthwash.  Please read over the following fact sheets that you were given:   [facility] Preparing for Surgery and or MRSA Information    Do not wear jewelry, make-up, hairpins, clips or nail polish.  Do not wear lotions, powders, deodorant, or perfumes.   Do not shave below the face/neck 48 hours prior to surgery.   Do not bring valuables to the hospital.    [facility] is not responsible for any belongings or valuables.               Contacts, dentures or bridgework may not be worn into surgery.  For patients discharged on the day of surgery, you will NOT be permitted to drive yourself home.   _x___  Take anti-hypertensive listed below, cardiac, seizure, asthma, anti-reflux and psychiatric medicines. These include:  1. Amlodipine/Norvasc  2. Atorvastatin/Lipitor  3. Metoprolol/Lopressor  _x___ Use inhalers on the day of surgery and bring to hospital day of surgery  _x___ NOW: Stop Metformin.  _x___ Do not take your INSULIN, TRIAMTERENE-HYDROCHLOROTHIAZIDE/DYAZIDE, OR LISINOPRIL/PRINIVIL on the day of surgery.   _x___ Follow recommendations from Cardiologist, Pulmonologist or PCP regarding stopping Aspirin, Coumadin, Plavix ,Eliquis, Effient, or Pradaxa, and Pletal.  _x___ NOW: Stop Anti-inflammatories such as Advil, Aleve, Ibuprofen, Motrin, Naproxen, Naprosyn, Goodies powders or aspirin products. OK to take Tylenol and Celebrex.   _x___ NOW: Stop Folic Acid supplements until after surgery.  But may continue Vitamin D, Vitamin B, and multivitamin.

## 2018-05-13 ENCOUNTER — Ambulatory Visit: Payer: BLUE CROSS/BLUE SHIELD

## 2018-05-13 ENCOUNTER — Other Ambulatory Visit: Payer: Self-pay

## 2018-05-13 ENCOUNTER — Ambulatory Visit: Payer: BLUE CROSS/BLUE SHIELD | Admitting: Anesthesiology

## 2018-05-13 ENCOUNTER — Encounter
Admission: RE | Disposition: A | Discharge status: Home / Self Care | Payer: Self-pay | Source: Ambulatory Visit | Attending: Internal Medicine

## 2018-05-13 ENCOUNTER — Ambulatory Visit
Admission: RE | Admit: 2018-05-13 | Discharge: 2018-05-13 | Disposition: A | Discharge status: Home / Self Care | Payer: BLUE CROSS/BLUE SHIELD | Source: Ambulatory Visit | Attending: Internal Medicine | Admitting: Internal Medicine

## 2018-05-13 DIAGNOSIS — M069 Rheumatoid arthritis, unspecified: Secondary | ICD-10-CM | POA: Diagnosis not present

## 2018-05-13 DIAGNOSIS — E119 Type 2 diabetes mellitus without complications: Secondary | ICD-10-CM | POA: Insufficient documentation

## 2018-05-13 DIAGNOSIS — J449 Chronic obstructive pulmonary disease, unspecified: Secondary | ICD-10-CM

## 2018-05-13 DIAGNOSIS — R918 Other nonspecific abnormal finding of lung field: Secondary | ICD-10-CM

## 2018-05-13 DIAGNOSIS — Z794 Long term (current) use of insulin: Secondary | ICD-10-CM | POA: Insufficient documentation

## 2018-05-13 DIAGNOSIS — Z880 Allergy status to penicillin: Secondary | ICD-10-CM | POA: Diagnosis not present

## 2018-05-13 DIAGNOSIS — Z79899 Other long term (current) drug therapy: Secondary | ICD-10-CM | POA: Insufficient documentation

## 2018-05-13 DIAGNOSIS — B3789 Other sites of candidiasis: Secondary | ICD-10-CM | POA: Insufficient documentation

## 2018-05-13 DIAGNOSIS — R590 Localized enlarged lymph nodes: Secondary | ICD-10-CM | POA: Diagnosis not present

## 2018-05-13 DIAGNOSIS — Z9889 Other specified postprocedural states: Secondary | ICD-10-CM

## 2018-05-13 DIAGNOSIS — Z87891 Personal history of nicotine dependence: Secondary | ICD-10-CM | POA: Insufficient documentation

## 2018-05-13 DIAGNOSIS — I10 Essential (primary) hypertension: Secondary | ICD-10-CM | POA: Insufficient documentation

## 2018-05-13 DIAGNOSIS — Z886 Allergy status to analgesic agent status: Secondary | ICD-10-CM | POA: Diagnosis not present

## 2018-05-13 DIAGNOSIS — J189 Pneumonia, unspecified organism: Secondary | ICD-10-CM

## 2018-05-13 DIAGNOSIS — E785 Hyperlipidemia, unspecified: Secondary | ICD-10-CM | POA: Insufficient documentation

## 2018-05-13 DIAGNOSIS — C3412 Malignant neoplasm of upper lobe, left bronchus or lung: Secondary | ICD-10-CM | POA: Insufficient documentation

## 2018-05-13 DIAGNOSIS — C771 Secondary and unspecified malignant neoplasm of intrathoracic lymph nodes: Secondary | ICD-10-CM | POA: Insufficient documentation

## 2018-05-13 DIAGNOSIS — J8410 Pulmonary fibrosis, unspecified: Secondary | ICD-10-CM | POA: Diagnosis not present

## 2018-05-13 DIAGNOSIS — Z7982 Long term (current) use of aspirin: Secondary | ICD-10-CM | POA: Diagnosis not present

## 2018-05-13 HISTORY — PX: ENDOBRONCHIAL ULTRASOUND: SHX5096

## 2018-05-13 LAB — GLUCOSE, CAPILLARY
GLUCOSE-CAPILLARY: 151 mg/dL — AB (ref 70–99)
Glucose-Capillary: 158 mg/dL — ABNORMAL HIGH (ref 70–99)

## 2018-05-13 SURGERY — ENDOBRONCHIAL ULTRASOUND (EBUS)
Anesthesia: General

## 2018-05-13 MED ORDER — SUGAMMADEX SODIUM 200 MG/2ML IV SOLN
INTRAVENOUS | Status: AC
  Filled 2018-05-13: qty 2

## 2018-05-13 MED ORDER — PROPOFOL 10 MG/ML IV BOLUS
INTRAVENOUS | Status: AC
  Filled 2018-05-13: qty 20

## 2018-05-13 MED ORDER — ROCURONIUM BROMIDE 50 MG/5ML IV SOLN
INTRAVENOUS | Status: AC
  Filled 2018-05-13: qty 1

## 2018-05-13 MED ORDER — FENTANYL CITRATE (PF) 100 MCG/2ML IJ SOLN
INTRAMUSCULAR | Status: AC
  Filled 2018-05-13: qty 2

## 2018-05-13 MED ORDER — LIDOCAINE HCL 2 % EX GEL
1.0000 "application " | Freq: Once | CUTANEOUS | Status: DC
  Filled 2018-05-13: qty 5

## 2018-05-13 MED ORDER — LIDOCAINE HCL (PF) 1 % IJ SOLN
INTRAMUSCULAR | Status: AC
  Filled 2018-05-13: qty 5

## 2018-05-13 MED ORDER — PHENYLEPHRINE HCL 0.25 % NA SOLN
1.0000 | Freq: Four times a day (QID) | NASAL | Status: DC | PRN
  Filled 2018-05-13: qty 15

## 2018-05-13 MED ORDER — PROPOFOL 10 MG/ML IV BOLUS
INTRAVENOUS | Status: DC | PRN
  Administered 2018-05-13: 160 mg via INTRAVENOUS

## 2018-05-13 MED ORDER — FAMOTIDINE 20 MG PO TABS
20.0000 mg | ORAL_TABLET | Freq: Once | ORAL | Status: AC
  Administered 2018-05-13: 20 mg via ORAL

## 2018-05-13 MED ORDER — DEXAMETHASONE SODIUM PHOSPHATE 10 MG/ML IJ SOLN
INTRAMUSCULAR | Status: DC | PRN
  Administered 2018-05-13: 5 mg via INTRAVENOUS

## 2018-05-13 MED ORDER — PHENYLEPHRINE HCL 10 MG/ML IJ SOLN
INTRAMUSCULAR | Status: DC | PRN
  Administered 2018-05-13 (×4): 100 ug via INTRAVENOUS

## 2018-05-13 MED ORDER — PHENYLEPHRINE HCL 10 MG/ML IJ SOLN
INTRAMUSCULAR | Status: AC
  Filled 2018-05-13: qty 1

## 2018-05-13 MED ORDER — BUTAMBEN-TETRACAINE-BENZOCAINE 2-2-14 % EX AERO
1.0000 | INHALATION_SPRAY | Freq: Once | CUTANEOUS | Status: DC
  Filled 2018-05-13: qty 20

## 2018-05-13 MED ORDER — SUGAMMADEX SODIUM 200 MG/2ML IV SOLN
INTRAVENOUS | Status: DC | PRN
  Administered 2018-05-13: 800 mg via INTRAVENOUS
  Administered 2018-05-13: 200 mg via INTRAVENOUS

## 2018-05-13 MED ORDER — FAMOTIDINE 20 MG PO TABS
ORAL_TABLET | ORAL | Status: AC
  Administered 2018-05-13: 20 mg via ORAL
  Filled 2018-05-13: qty 1

## 2018-05-13 MED ORDER — MIDAZOLAM HCL 2 MG/2ML IJ SOLN
INTRAMUSCULAR | Status: AC
  Filled 2018-05-13: qty 2

## 2018-05-13 MED ORDER — LIDOCAINE HCL (CARDIAC) PF 100 MG/5ML IV SOSY
PREFILLED_SYRINGE | INTRAVENOUS | Status: DC | PRN
  Administered 2018-05-13: 80 mg via INTRAVENOUS

## 2018-05-13 MED ORDER — ONDANSETRON HCL 4 MG/2ML IJ SOLN: 4.0000 mg | Freq: Once | INTRAMUSCULAR | Status: DC | PRN

## 2018-05-13 MED ORDER — ONDANSETRON HCL 4 MG/2ML IJ SOLN
INTRAMUSCULAR | Status: DC | PRN
  Administered 2018-05-13: 4 mg via INTRAVENOUS

## 2018-05-13 MED ORDER — FENTANYL CITRATE (PF) 100 MCG/2ML IJ SOLN: 25.0000 ug | INTRAMUSCULAR | Status: DC | PRN

## 2018-05-13 MED ORDER — SODIUM CHLORIDE 0.9 % IV SOLN
INTRAVENOUS | Status: DC
  Administered 2018-05-13: 12:00:00 via INTRAVENOUS

## 2018-05-13 MED ORDER — DEXAMETHASONE SODIUM PHOSPHATE 10 MG/ML IJ SOLN
INTRAMUSCULAR | Status: AC
  Filled 2018-05-13: qty 1

## 2018-05-13 MED ORDER — FENTANYL CITRATE (PF) 100 MCG/2ML IJ SOLN
INTRAMUSCULAR | Status: DC | PRN
  Administered 2018-05-13 (×3): 50 ug via INTRAVENOUS

## 2018-05-13 MED ORDER — ONDANSETRON HCL 4 MG/2ML IJ SOLN
INTRAMUSCULAR | Status: AC
  Filled 2018-05-13: qty 2

## 2018-05-13 MED ORDER — MIDAZOLAM HCL 2 MG/2ML IJ SOLN
INTRAMUSCULAR | Status: DC | PRN
  Administered 2018-05-13: 2 mg via INTRAVENOUS

## 2018-05-13 MED ORDER — ROCURONIUM BROMIDE 100 MG/10ML IV SOLN
INTRAVENOUS | Status: DC | PRN
  Administered 2018-05-13: 50 mg via INTRAVENOUS

## 2018-05-13 NOTE — Transfer of Care (Signed)
Immediate Anesthesia Transfer of Care Note  Patient: Jesyca Weisenburger Fisk  Procedure(s) Performed: ENDOBRONCHIAL ULTRASOUND (N/A )  Patient Location: PACU  Anesthesia Type:General  Level of Consciousness: drowsy  Airway & Oxygen Therapy: Patient Spontanous Breathing and Patient connected to face mask oxygen  Post-op Assessment: Report given to RN and Post -op Vital signs reviewed and stable  Post vital signs: Reviewed and stable  Last Vitals:  Vitals Value Taken Time  BP 154/67 05/13/2018  2:28 PM  Temp 36.3 C 05/13/2018  2:28 PM  Pulse 101 05/13/2018  2:30 PM  Resp 23 05/13/2018  2:30 PM  SpO2 100 % 05/13/2018  2:30 PM  Vitals shown include unvalidated device data.  Last Pain:  Vitals:   05/13/18 1428  TempSrc:   PainSc: Asleep         Complications: No apparent anesthesia complications

## 2018-05-13 NOTE — Anesthesia Post-op Follow-up Note (Signed)
Anesthesia QCDR form completed.        

## 2018-05-13 NOTE — Anesthesia Postprocedure Evaluation (Signed)
Anesthesia Post Note  Patient: Tamara Parrish  Procedure(s) Performed: ENDOBRONCHIAL ULTRASOUND (N/A )  Patient location during evaluation: PACU Anesthesia Type: General Level of consciousness: awake and alert Pain management: pain level controlled Vital Signs Assessment: post-procedure vital signs reviewed and stable Respiratory status: spontaneous breathing, nonlabored ventilation, respiratory function stable and patient connected to nasal cannula oxygen Cardiovascular status: blood pressure returned to baseline and stable Postop Assessment: no apparent nausea or vomiting Anesthetic complications: no     Last Vitals:  Vitals:   05/13/18 1443 05/13/18 1458  BP: 140/64 (!) 149/72  Pulse: 88 91  Resp: (!) 21 15  Temp:  (!) 36.1 C  SpO2: 100% 98%    Last Pain:  Vitals:   05/13/18 1458  TempSrc:   PainSc: 0-No pain                 Trip Cavanagh S

## 2018-05-13 NOTE — Anesthesia Preprocedure Evaluation (Addendum)
Anesthesia Evaluation  Patient identified by MRN, date of birth, ID band Patient awake    Reviewed: Allergy & Precautions, NPO status , Patient's Chart, lab work & pertinent test results, reviewed documented beta blocker date and time   Airway Mallampati: III  TM Distance: >3 FB     Dental  (+) Chipped, Upper Dentures, Partial Lower   Pulmonary shortness of breath, pneumonia, former smoker,           Cardiovascular hypertension, Pt. on medications and Pt. on home beta blockers      Neuro/Psych    GI/Hepatic   Endo/Other  diabetes, Type 2  Renal/GU      Musculoskeletal  (+) Arthritis , Rheumatoid disorders,    Abdominal   Peds  Hematology   Anesthesia Other Findings Hb 9.2. LUL mass. EKG ok.  Reproductive/Obstetrics                            Anesthesia Physical Anesthesia Plan  ASA: III  Anesthesia Plan: General   Post-op Pain Management:    Induction: Intravenous  PONV Risk Score and Plan:   Airway Management Planned: Oral ETT  Additional Equipment:   Intra-op Plan:   Post-operative Plan:   Informed Consent: I have reviewed the patients History and Physical, chart, labs and discussed the procedure including the risks, benefits and alternatives for the proposed anesthesia with the patient or authorized representative who has indicated his/her understanding and acceptance.     Plan Discussed with: CRNA  Anesthesia Plan Comments:         Anesthesia Quick Evaluation

## 2018-05-13 NOTE — Anesthesia Procedure Notes (Signed)
Procedure Name: Intubation Date/Time: 05/13/2018 1:08 PM Performed by: Eben Burow, CRNA Pre-anesthesia Checklist: Patient identified, Timeout performed, Emergency Drugs available, Suction available and Patient being monitored Patient Re-evaluated:Patient Re-evaluated prior to induction Oxygen Delivery Method: Circle system utilized Preoxygenation: Pre-oxygenation with 100% oxygen Induction Type: IV induction Ventilation: Mask ventilation without difficulty and Oral airway inserted - appropriate to patient size Laryngoscope Size: Mac and 3 Grade View: Grade I Tube type: Oral Tube size: 8.0 mm Number of attempts: 1 Airway Equipment and Method: Stylet,  LTA kit utilized and Oral airway Placement Confirmation: ETT inserted through vocal cords under direct vision,  positive ETCO2 and breath sounds checked- equal and bilateral Secured at: 21 cm Tube secured with: Tape Dental Injury: Teeth and Oropharynx as per pre-operative assessment

## 2018-05-13 NOTE — Op Note (Signed)
  [facility]            Bronchoscopy Note   FINDINGS/SUMMARY:  - EBUS bronchoscopy with EBUS guided lymph node sampling of right hilar, subcarinal, and left hilar lymph node stations. - Left upper lobe cytology brushings performed. -Left upper lobe bronchoalveolar lavage performed. - Left upper lobe transbronchial biopsies performed under fluoroscopic guidance. -Copious mucosal secretions seen throughout both lungs which were suctioned and removed. - Candidal tracheitis, with evidence of candidal infection seen in the main trachea.  Indication: left upper lobe lung moass.  The patient (or their representative) was informed of the risks (including but not limited to bleeding, infection, respiratory failure, lung injury, tooth/oral injury) and benefits of the procedure and gave consent, see chart.   Pre-op diagnosis: Left upper lobe lung mass.  Post-op diagnosis: same Estimated blood loss: 10 cc  Medications for procedure: See anesthesia note.   Procedure description: After obtaining informed consent, timeout was called to confirm the patient and the procedure.  Sedation was provided by anesthesia who intubated the patient, please see their notes for further details. The EBUS bronchoscope was passed by the endotracheal tube to the right hilar area a small right hilar node was identified, multiple were made with good returns.  The bronchoscope was then taken to the right paratracheal and left paratracheal areas a small node was seen in the right, there was a note found in the left, but was directly between 2 large vessels, this was deemed too high risk to biopsy, the bronchoscope was then taken to the subcarinal node station, and enlarged lymph node was noted here, multiple passes were taken with good returns.  The bronchoscope was then taken to the left hilar node station, and a large node was seen here, multiple passes were taken good returns.  The EBUS bronchoscope was  then removed. The white light bronchoscope was passed by the endotracheal tube, an anatomical tumor was undertaken, there was copious mucosal secretions throughout both lungs which were suctioned.  There appeared to be candidal bronchitis seen in the trachea.  The scope was then taken to the left upper lobe, there was narrowing seen at the anterior segment.  Multiple brushings were taken from the left upper lobe segments and subsegments.  Transbronchial biopsies were then taken from the left upper lobe under fluoroscopic guidance.  Subsequently bronchoalveolar lavage was performed of the left upper lobe.  As adequate samples have been taken at that time, the procedure was terminated.    Condition post procedure: Stable.   Complications: None noted.     Marda Stalker, M.D., F.C.C.P. Board Certified in Internal Medicine, Pulmonary Medicine, [place], and Sleep Medicine.  [facility] Office Number: [phone]  05/13/2018

## 2018-05-13 NOTE — Interval H&P Note (Signed)
History and Physical Interval Note:  05/13/2018 12:00 PM  Tamara Parrish  has presented today for surgery, with the diagnosis of unresolved pneumonia  The various methods of treatment have been discussed with the patient and family. After consideration of risks, benefits and other options for treatment, the patient has consented to  Procedure(s): ENDOBRONCHIAL ULTRASOUND (N/A) as a surgical intervention .  The patient's history has been reviewed, patient examined, no change in status, stable for surgery.  I have reviewed the patient's chart and labs.  Questions were answered to the patient's satisfaction.     [name]

## 2018-05-13 NOTE — Discharge Instructions (Signed)

## 2018-05-14 ENCOUNTER — Encounter: Payer: Self-pay | Admitting: Internal Medicine

## 2018-05-16 ENCOUNTER — Telehealth: Payer: Self-pay | Admitting: Internal Medicine

## 2018-05-16 LAB — CULTURE, BAL-QUANTITATIVE W GRAM STAIN: Culture: NO GROWTH

## 2018-05-16 LAB — CULTURE, BAL-QUANTITATIVE: SPECIAL REQUESTS: NORMAL

## 2018-05-16 NOTE — Telephone Encounter (Signed)
Patient reports blood in sputum s/p lung biopsy on Friday. She says it was heavy on Friday and Saturday but improved somewhat on Sunday.  Patient reports this bleeding is only when coughing and episodes have been 3-4 times a day with soreness in throat.  Denies bright red sputum, reports pinkish red color.    Patient was told MD would send something to the pharmacy if needed .  She uses [facility] on [address]   Please call

## 2018-05-16 NOTE — Telephone Encounter (Signed)
We took several biopsies, so I am not surprised that she is coughing up more blood that usual. If it is getting better on its own and she has no trouble breathing we will watch it without antibiotics. If she has any trouble breathing then she should have antibiotics.

## 2018-05-16 NOTE — Telephone Encounter (Signed)
Spoke to patient regarding her message this morning. I let her know of Dr. Mathis Fare recommendation to continue on if she is getting better without antibiotics. She was advised if she has any increased bleeding to let us know.  Patient stated that she coughed a lot this morning and had to put her oxygen on but feels much better now. Patient has no further concerns at this time.

## 2018-05-17 LAB — ACID FAST SMEAR (AFB)

## 2018-05-17 LAB — ACID FAST SMEAR (AFB, MYCOBACTERIA): Acid Fast Smear: NEGATIVE

## 2018-05-18 LAB — CYTOLOGY - NON PAP

## 2018-05-18 LAB — SURGICAL PATHOLOGY

## 2018-05-19 NOTE — Progress Notes (Signed)
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 Pulmonary fibrosis, likely related to underlying rheumatoid arthritis. History of smoking, recently quit.  - Left upper lobe and left hilar biopsies positive for cancer. - Subcarinal and right hilar lymph node sampling was negative for cancer. - Discussed with patient and family, will refer patient for PET scan, and urgent referral to oncology.  Orders Placed This Encounter  Procedures  . NM PET Image Initial (PI) Skull Base To Thigh  . Ambulatory referral to Oncology   Return in about 4 weeks (around 06/17/2018).   Date: 05/19/2018  MRN# [id number] Tamara Parrish 1956-05-02   Tamara Parrish is a 62 y.o. old female seen in follow up for chief complaint of  No chief complaint on file.    HPI:  The patient is a 62 year old female with a history of hypertension, hyperlipidemia, rheumatoid arthritis.  She was admitted recently with pneumonia with sepsis from 6/17 to 02/11/2018 and then readmitted due to persistent pneumonia.  Patient's initial repeat follow-up CT was not covered by insurance, therefore follow-up CT was delayed by 1 month. She status post EBUS bronchoscopy on 05/13/2018, results were positive for squamous cell lung cancer in the left upper lobe mass in the left hilar lymph node.  Biopsies of the subcarinal and right hilar lymph node were negative.   **CT chest  05/05/2018 in comparison with previous 02/21/2018,>>  imaging personally reviewed.  The left lower lobe and right upper lobe fibrotic changes have improved.  However there is persistent masslike consolidation of the left upper lobe there are fibrotic changes of the lungs bibasilarly, greatest in the left, these are also seen in the lingula peripherally, right apex. There is masslike consolidation in the apical posterior segment of the left upper lobe.  There remains significant lymphadenopathy  seen in the right hilar, subcarinal and right paratracheal area.  There is a pulmonary arterial enlargement suggestive of pulmonary hypertension. Opacification of the left upper lobe appears advanced in comparison to previous chest x-ray 2 weeks prior  Desat walk 04/04/18>> At rest on RA sat was 95% and HR 88. Pt walked 360 feet, mild dyspnea. Slow pace. Sat was 88% and HR 102.   Medication:    Current Outpatient Medications:  .  albuterol (PROVENTIL HFA;VENTOLIN HFA) 108 (90 Base) MCG/ACT inhaler, Inhale 2 puffs into the lungs every 6 (six) hours as needed for wheezing or shortness of breath., Disp: 1 Inhaler, Rfl: 2 .  ALPRAZolam (XANAX) 0.25 MG tablet, Take 0.25 mg by mouth 2 (two) times daily as needed for anxiety. , Disp: , Rfl:  .  amLODipine (NORVASC) 5 MG tablet, Take 5 mg by mouth daily., Disp: , Rfl: 3 .  aspirin 81 MG chewable tablet, Chew 1 tablet by mouth daily., Disp: , Rfl:  .  atorvastatin (LIPITOR) 40 MG tablet, Take 40 mg by mouth daily., Disp: , Rfl: 3 .  folic acid (FOLVITE) 1 MG tablet, Take 1 mg by mouth daily., Disp: , Rfl: 11 .  guaiFENesin-dextromethorphan (ROBITUSSIN DM) 100-10 MG/5ML syrup, Take 5 mLs by mouth every 4 (four) hours as needed for cough. (Patient not taking: Reported on 05/11/2018), Disp: 118 mL, Rfl: 0 .  Insulin Glargine (BASAGLAR KWIKPEN) 100 UNIT/ML SOPN, Inject 36 Units into the skin at bedtime., Disp: , Rfl: 12 .  lisinopril (PRINIVIL,ZESTRIL) 40 MG tablet, Take 40 mg by mouth daily., Disp: , Rfl: 3 .  metFORMIN (  GLUCOPHAGE) 1000 MG tablet, Take 1,000 mg by mouth 2 (two) times daily., Disp: , Rfl: 5 .  methotrexate (RHEUMATREX) 2.5 MG tablet, Take 2.5 mg by mouth once a week. Caution:Chemotherapy. Protect from light., Disp: , Rfl:  .  metoprolol tartrate (LOPRESSOR) 25 MG tablet, Take 25 mg by mouth 2 (two) times daily., Disp: , Rfl: 3 .  ondansetron (ZOFRAN-ODT) 4 MG disintegrating tablet, Take 4 mg by mouth every 8 (eight) hours as needed for nausea.  , Disp: , Rfl:  .  triamterene-hydrochlorothiazide (DYAZIDE) 37.5-25 MG capsule, Take 1 capsule by mouth daily., Disp: , Rfl: 3   Allergies:  Ibuprofen and Penicillins  Review of Systems:  Constitutional: Feels well. Cardiovascular: Denies chest pain, exertional chest pain.  Pulmonary: Denies hemoptysis, pleuritic chest pain.   The remainder of systems were reviewed and were found to be negative other than what is documented in the HPI.    Physical Examination:   VS: BP (!) 164/60 (BP Location: Left Arm, Cuff Size: Normal)   Pulse 94   Resp 16   Ht 5\' 5"  (1.651 m)   Wt 181 lb (82.1 kg)   SpO2 97%   BMI 30.12 kg/m   General Appearance: No distress  Neuro:without focal findings, mental status, speech normal, alert and oriented HEENT: PERRLA, EOM intact Pulmonary: No wheezing, No rales  CardiovascularNormal S1,S2.  No m/r/g.  Abdomen: Benign, Soft, non-tender, No masses Renal:  No costovertebral tenderness  GU:  No performed at this time. Endoc: No evident thyromegaly, no signs of acromegaly or Cushing features Skin:   warm, no rashes, no ecchymosis  Extremities: normal, no cyanosis, clubbing.     LABORATORY PANEL:   CBC No results for input(s): WBC, HGB, HCT, PLT in the last 168 hours. ------------------------------------------------------------------------------------------------------------------  Chemistries  No results for input(s): NA, K, CL, CO2, GLUCOSE, BUN, CREATININE, CALCIUM, MG, AST, ALT, ALKPHOS, BILITOT in the last 168 hours.  Invalid input(s): GFRCGP ------------------------------------------------------------------------------------------------------------------  Cardiac Enzymes No results for input(s): TROPONINI in the last 168 hours. ------------------------------------------------------------  RADIOLOGY:   No results found for this or any previous visit. Results for orders placed during the hospital encounter of 02/07/18  DG Chest 2 View    Narrative CLINICAL DATA:  Shortness of breath and weakness, history of tobacco use  EXAM: CHEST - 2 VIEW  COMPARISON:  02/12/2014  FINDINGS: Cardiac shadow is within normal limits. The lungs are well aerated bilaterally with diffuse interstitial changes. A pleural-based wedge-shaped density is noted in the left upper lobe. Given the short-term history this likely represents an acute on chronic infiltrate. Patchy basilar infiltrate on the left is noted as well. No sizable effusion is seen. No acute bony abnormality is seen.  IMPRESSION: Changes most consistent with multifocal pneumonia. Followup PA and lateral chest X-ray is recommended in 3-4 weeks following trial of antibiotic therapy to ensure resolution and exclude underlying malignancy.   Electronically Signed   By: Inez Catalina M.D.   On: 02/07/2018 19:20    ------------------------------------------------------------------------------------------------------------------  Thank  you for allowing [facility] to assist in the care of your patient. Our recommendations are noted above.  Please contact us if we can be of further service.   Marda Stalker, M.D., F.C.C.P.  Board Certified in Internal Medicine, Pulmonary Medicine, [place], and Sleep Medicine.  [facility] Office Number: [phone]  05/19/2018

## 2018-05-20 ENCOUNTER — Encounter: Payer: Self-pay | Admitting: *Deleted

## 2018-05-20 ENCOUNTER — Ambulatory Visit (INDEPENDENT_AMBULATORY_CARE_PROVIDER_SITE_OTHER): Payer: BLUE CROSS/BLUE SHIELD | Admitting: Internal Medicine

## 2018-05-20 ENCOUNTER — Encounter: Payer: Self-pay | Admitting: Internal Medicine

## 2018-05-20 VITALS — BP 164/60 | HR 94 | Resp 16 | Ht 65.0 in | Wt 181.0 lb

## 2018-05-20 DIAGNOSIS — C3490 Malignant neoplasm of unspecified part of unspecified bronchus or lung: Secondary | ICD-10-CM | POA: Diagnosis not present

## 2018-05-20 DIAGNOSIS — C3492 Malignant neoplasm of unspecified part of left bronchus or lung: Secondary | ICD-10-CM

## 2018-05-20 NOTE — Patient Instructions (Signed)
Will refer you to oncology (cancer doctor).  Will send you for a PET scan.

## 2018-05-20 NOTE — Progress Notes (Signed)
  Oncology Nurse Navigator Documentation  Navigator Location: [facility] (05/20/18 1600) Referral date to RadOnc/MedOnc: 05/20/18 (05/20/18 1600) )Navigator Encounter Type: Introductory phone call (05/20/18 1600)   Abnormal Finding Date: 05/06/18 (05/20/18 1600) Confirmed Diagnosis Date: 05/18/18 (05/20/18 1600)                   Barriers/Navigation Needs: Coordination of Care (05/20/18 1600)   Interventions: Coordination of Care (05/20/18 1600)   Coordination of Care: Appts (05/20/18 1600)        Acuity: Level 2 (05/20/18 1600)   Acuity Level 2: Initial guidance, education and coordination as needed;Educational needs;Assistance expediting appointments (05/20/18 1600)  phone call made to patient to introduce to navigator services and give new appt info. Pt did not answer. Message left with appt info to see Dr. Tasia Catchings on Wed 10/2 at 10:45am. Instructed to call back if has any questions or concerns.    Time Spent with Patient: 30 (05/20/18 1600)

## 2018-05-24 ENCOUNTER — Encounter
Admission: RE | Admit: 2018-05-24 | Discharge: 2018-05-24 | Disposition: A | Discharge status: Home / Self Care | Payer: BLUE CROSS/BLUE SHIELD | Source: Ambulatory Visit | Attending: Internal Medicine | Admitting: Internal Medicine

## 2018-05-24 DIAGNOSIS — C3490 Malignant neoplasm of unspecified part of unspecified bronchus or lung: Secondary | ICD-10-CM | POA: Insufficient documentation

## 2018-05-24 LAB — GLUCOSE, CAPILLARY: Glucose-Capillary: 122 mg/dL — ABNORMAL HIGH (ref 70–99)

## 2018-05-24 MED ORDER — FLUDEOXYGLUCOSE F - 18 (FDG) INJECTION
9.4000 | Freq: Once | INTRAVENOUS | Status: AC | PRN
  Administered 2018-05-24: 9.63 via INTRAVENOUS

## 2018-05-25 ENCOUNTER — Encounter: Payer: Self-pay | Admitting: *Deleted

## 2018-05-25 ENCOUNTER — Inpatient Hospital Stay: Payer: BLUE CROSS/BLUE SHIELD | Attending: Oncology | Admitting: Oncology

## 2018-05-25 ENCOUNTER — Ambulatory Visit (HOSPITAL_COMMUNITY)
Admission: RE | Admit: 2018-05-25 | Discharge: 2018-05-25 | Disposition: A | Discharge status: Home / Self Care | Payer: BLUE CROSS/BLUE SHIELD | Source: Ambulatory Visit | Attending: Oncology | Admitting: Oncology

## 2018-05-25 ENCOUNTER — Inpatient Hospital Stay: Payer: BLUE CROSS/BLUE SHIELD

## 2018-05-25 ENCOUNTER — Encounter: Payer: Self-pay | Admitting: Oncology

## 2018-05-25 ENCOUNTER — Encounter (HOSPITAL_COMMUNITY): Payer: Self-pay

## 2018-05-25 ENCOUNTER — Other Ambulatory Visit: Payer: Self-pay

## 2018-05-25 VITALS — BP 153/76 | HR 82 | Temp 96.1°F | Resp 16 | Ht 66.0 in | Wt 182.4 lb

## 2018-05-25 DIAGNOSIS — N189 Chronic kidney disease, unspecified: Secondary | ICD-10-CM | POA: Diagnosis not present

## 2018-05-25 DIAGNOSIS — J449 Chronic obstructive pulmonary disease, unspecified: Secondary | ICD-10-CM | POA: Diagnosis not present

## 2018-05-25 DIAGNOSIS — Z23 Encounter for immunization: Secondary | ICD-10-CM | POA: Insufficient documentation

## 2018-05-25 DIAGNOSIS — M069 Rheumatoid arthritis, unspecified: Secondary | ICD-10-CM

## 2018-05-25 DIAGNOSIS — C3490 Malignant neoplasm of unspecified part of unspecified bronchus or lung: Secondary | ICD-10-CM

## 2018-05-25 DIAGNOSIS — Z5189 Encounter for other specified aftercare: Secondary | ICD-10-CM | POA: Diagnosis not present

## 2018-05-25 DIAGNOSIS — E1122 Type 2 diabetes mellitus with diabetic chronic kidney disease: Secondary | ICD-10-CM | POA: Diagnosis not present

## 2018-05-25 DIAGNOSIS — D473 Essential (hemorrhagic) thrombocythemia: Secondary | ICD-10-CM | POA: Diagnosis not present

## 2018-05-25 DIAGNOSIS — I129 Hypertensive chronic kidney disease with stage 1 through stage 4 chronic kidney disease, or unspecified chronic kidney disease: Secondary | ICD-10-CM | POA: Insufficient documentation

## 2018-05-25 DIAGNOSIS — G893 Neoplasm related pain (acute) (chronic): Secondary | ICD-10-CM | POA: Diagnosis not present

## 2018-05-25 DIAGNOSIS — Z7189 Other specified counseling: Secondary | ICD-10-CM

## 2018-05-25 DIAGNOSIS — D649 Anemia, unspecified: Secondary | ICD-10-CM | POA: Diagnosis not present

## 2018-05-25 DIAGNOSIS — Z5111 Encounter for antineoplastic chemotherapy: Secondary | ICD-10-CM | POA: Insufficient documentation

## 2018-05-25 DIAGNOSIS — Z801 Family history of malignant neoplasm of trachea, bronchus and lung: Secondary | ICD-10-CM | POA: Diagnosis not present

## 2018-05-25 DIAGNOSIS — C3412 Malignant neoplasm of upper lobe, left bronchus or lung: Secondary | ICD-10-CM | POA: Diagnosis present

## 2018-05-25 DIAGNOSIS — Z87891 Personal history of nicotine dependence: Secondary | ICD-10-CM

## 2018-05-25 DIAGNOSIS — D75839 Thrombocytosis, unspecified: Secondary | ICD-10-CM

## 2018-05-25 LAB — FERRITIN: Ferritin: 181 ng/mL (ref 11–307)

## 2018-05-25 LAB — CBC WITH DIFFERENTIAL/PLATELET
Basophils Absolute: 0.2 10*3/uL — ABNORMAL HIGH (ref 0–0.1)
Basophils Relative: 1 %
EOS ABS: 0.4 10*3/uL (ref 0–0.7)
EOS PCT: 3 %
HEMATOCRIT: 30.8 % — AB (ref 35.0–47.0)
Hemoglobin: 10.2 g/dL — ABNORMAL LOW (ref 12.0–16.0)
LYMPHS ABS: 2.6 10*3/uL (ref 1.0–3.6)
Lymphocytes Relative: 19 %
MCH: 28.8 pg (ref 26.0–34.0)
MCHC: 33 g/dL (ref 32.0–36.0)
MCV: 87.1 fL (ref 80.0–100.0)
MONO ABS: 0.9 10*3/uL (ref 0.2–0.9)
MONOS PCT: 6 %
NEUTROS PCT: 71 %
Neutro Abs: 9.5 10*3/uL — ABNORMAL HIGH (ref 1.4–6.5)
Platelets: 575 10*3/uL — ABNORMAL HIGH (ref 150–440)
RBC: 3.53 MIL/uL — ABNORMAL LOW (ref 3.80–5.20)
RDW: 16.1 % — AB (ref 11.5–14.5)
WBC: 13.5 10*3/uL — AB (ref 3.6–11.0)

## 2018-05-25 LAB — COMPREHENSIVE METABOLIC PANEL
ALT: 9 U/L (ref 0–44)
AST: 16 U/L (ref 15–41)
Albumin: 2.9 g/dL — ABNORMAL LOW (ref 3.5–5.0)
Alkaline Phosphatase: 93 U/L (ref 38–126)
Anion gap: 10 (ref 5–15)
BILIRUBIN TOTAL: 0.4 mg/dL (ref 0.3–1.2)
BUN: 19 mg/dL (ref 8–23)
CO2: 24 mmol/L (ref 22–32)
CREATININE: 0.92 mg/dL (ref 0.44–1.00)
Calcium: 9 mg/dL (ref 8.9–10.3)
Chloride: 100 mmol/L (ref 98–111)
Glucose, Bld: 269 mg/dL — ABNORMAL HIGH (ref 70–99)
POTASSIUM: 3.7 mmol/L (ref 3.5–5.1)
Sodium: 134 mmol/L — ABNORMAL LOW (ref 135–145)
TOTAL PROTEIN: 8 g/dL (ref 6.5–8.1)

## 2018-05-25 LAB — IRON AND TIBC
IRON: 27 ug/dL — AB (ref 28–170)
Saturation Ratios: 11 % (ref 10.4–31.8)
TIBC: 241 ug/dL — AB (ref 250–450)
UIBC: 214 ug/dL

## 2018-05-25 MED ORDER — OXYCODONE HCL 5 MG PO TABS: 5.0000 mg | ORAL_TABLET | Freq: Three times a day (TID) | ORAL | 0 refills | Status: DC | PRN

## 2018-05-25 MED ORDER — PROCHLORPERAZINE MALEATE 10 MG PO TABS: 10.0000 mg | ORAL_TABLET | Freq: Four times a day (QID) | ORAL | 1 refills | Status: AC | PRN

## 2018-05-25 MED ORDER — DEXAMETHASONE 4 MG PO TABS: 8.0000 mg | ORAL_TABLET | Freq: Every day | ORAL | 1 refills | Status: DC

## 2018-05-25 MED ORDER — LIDOCAINE-PRILOCAINE 2.5-2.5 % EX CREA: TOPICAL_CREAM | CUTANEOUS | 3 refills | Status: DC

## 2018-05-25 MED ORDER — ONDANSETRON HCL 8 MG PO TABS: 8.0000 mg | ORAL_TABLET | Freq: Two times a day (BID) | ORAL | 1 refills | Status: DC | PRN

## 2018-05-25 NOTE — Progress Notes (Signed)
Patient here today as a new patient with squamous cell lung cancer.

## 2018-05-25 NOTE — Progress Notes (Signed)
  Oncology Nurse Navigator Documentation  Navigator Location: [facility] (05/25/18 1400)   )Navigator Encounter Type: Initial MedOnc (05/25/18 1400)                       Treatment Phase: Pre-Tx/Tx Discussion (05/25/18 1400) Barriers/Navigation Needs: Education;Coordination of Care (05/25/18 1400) Education: Understanding Cancer/ Treatment Options;Pain/ Symptom Management;Newly Diagnosed Cancer Education (05/25/18 1400) Interventions: Referrals;Coordination of Care;Education (05/25/18 1400)   Coordination of Care: Appts;Radiology;Chemo (05/25/18 1400) Education Method: Verbal;Written (05/25/18 1400)         met with patient and her family during initial med-onc consultation with Dr. Tasia Catchings to discuss PET scan results and treatment options. All questions answered at the time of visit. Pt given resources regarding diagnosis and supportive services available. Reviewed upcoming appts for brain MRI, chemo class, and chemo scheduled to start on 10/8. Pt informed to expect phone call from vascular surgeon's office with date and time for port placement prior to 10/8. Pt given info to contact social services to start disability application. No further questions or needs. Instructed pt and family to call with any further questions or needs. Pt and family verbalized understanding.       Time Spent with Patient: 60 (05/25/18 1400)

## 2018-05-25 NOTE — Patient Instructions (Signed)
Pembrolizumab injection What is this medicine? PEMBROLIZUMAB (pem broe liz ue mab) is a monoclonal antibody. It is used to treat melanoma, head and neck cancer, Hodgkin lymphoma, non-small cell lung cancer, urothelial cancer, stomach cancer, and cancers that have a certain genetic condition. This medicine may be used for other purposes; ask your health care provider or pharmacist if you have questions. COMMON BRAND NAME(S): Keytruda What should I tell my health care provider before I take this medicine? They need to know if you have any of these conditions: -diabetes -immune system problems -inflammatory bowel disease -liver disease -lung or breathing disease -lupus -organ transplant -an unusual or allergic reaction to pembrolizumab, other medicines, foods, dyes, or preservatives -pregnant or trying to get pregnant -breast-feeding How should I use this medicine? This medicine is for infusion into a vein. It is given by a health care professional in a hospital or clinic setting. A special MedGuide will be given to you before each treatment. Be sure to read this information carefully each time. Talk to your pediatrician regarding the use of this medicine in children. While this drug may be prescribed for selected conditions, precautions do apply. Overdosage: If you think you have taken too much of this medicine contact a poison control center or emergency room at once. NOTE: This medicine is only for you. Do not share this medicine with others. What if I miss a dose? It is important not to miss your dose. Call your doctor or health care professional if you are unable to keep an appointment. What may interact with this medicine? Interactions have not been studied. Give your health care provider a list of all the medicines, herbs, non-prescription drugs, or dietary supplements you use. Also tell them if you smoke, drink alcohol, or use illegal drugs. Some items may interact with your  medicine. This list may not describe all possible interactions. Give your health care provider a list of all the medicines, herbs, non-prescription drugs, or dietary supplements you use. Also tell them if you smoke, drink alcohol, or use illegal drugs. Some items may interact with your medicine. What should I watch for while using this medicine? Your condition will be monitored carefully while you are receiving this medicine. You may need blood work done while you are taking this medicine. Do not become pregnant while taking this medicine or for 4 months after stopping it. Women should inform their doctor if they wish to become pregnant or think they might be pregnant. There is a potential for serious side effects to an unborn child. Talk to your health care professional or pharmacist for more information. Do not breast-feed an infant while taking this medicine or for 4 months after the last dose. What side effects may I notice from receiving this medicine? Side effects that you should report to your doctor or health care professional as soon as possible: -allergic reactions like skin rash, itching or hives, swelling of the face, lips, or tongue -bloody or black, tarry -breathing problems -changes in vision -chest pain -chills -constipation -cough -dizziness or feeling faint or lightheaded -fast or irregular heartbeat -fever -flushing -hair loss -low blood counts - this medicine may decrease the number of white blood cells, red blood cells and platelets. You may be at increased risk for infections and bleeding. -muscle pain -muscle weakness -persistent headache -signs and symptoms of high blood sugar such as dizziness; dry mouth; dry skin; fruity breath; nausea; stomach pain; increased hunger or thirst; increased urination -signs and symptoms of kidney  injury like trouble passing urine or change in the amount of urine -signs and symptoms of liver injury like dark urine, light-colored  stools, loss of appetite, nausea, right upper belly pain, yellowing of the eyes or skin -stomach pain -sweating -weight loss Side effects that usually do not require medical attention (report to your doctor or health care professional if they continue or are bothersome): -decreased appetite -diarrhea -tiredness This list may not describe all possible side effects. Call your doctor for medical advice about side effects. You may report side effects to FDA at 1-800-FDA-1088. Where should I keep my medicine? This drug is given in a hospital or clinic and will not be stored at home. NOTE: This sheet is a summary. It may not cover all possible information. If you have questions about this medicine, talk to your doctor, pharmacist, or health care provider.  2018 [employer]/Gold Standard (2016-05-19 12:29:36) Paclitaxel injection What is this medicine? PACLITAXEL (PAK li TAX el) is a chemotherapy drug. It targets fast dividing cells, like cancer cells, and causes these cells to die. This medicine is used to treat ovarian cancer, breast cancer, and other cancers. This medicine may be used for other purposes; ask your health care provider or pharmacist if you have questions. COMMON BRAND NAME(S): Onxol, Taxol What should I tell my health care provider before I take this medicine? They need to know if you have any of these conditions: -blood disorders -irregular heartbeat -infection (especially a virus infection such as chickenpox, cold sores, or herpes) -liver disease -previous or ongoing radiation therapy -an unusual or allergic reaction to paclitaxel, alcohol, polyoxyethylated castor oil, other chemotherapy agents, other medicines, foods, dyes, or preservatives -pregnant or trying to get pregnant -breast-feeding How should I use this medicine? This drug is given as an infusion into a vein. It is administered in a hospital or clinic by a specially trained health care professional. Talk to your  pediatrician regarding the use of this medicine in children. Special care may be needed. Overdosage: If you think you have taken too much of this medicine contact a poison control center or emergency room at once. NOTE: This medicine is only for you. Do not share this medicine with others. What if I miss a dose? It is important not to miss your dose. Call your doctor or health care professional if you are unable to keep an appointment. What may interact with this medicine? Do not take this medicine with any of the following medications: -disulfiram -metronidazole This medicine may also interact with the following medications: -cyclosporine -diazepam -ketoconazole -medicines to increase blood counts like filgrastim, pegfilgrastim, sargramostim -other chemotherapy drugs like cisplatin, doxorubicin, epirubicin, etoposide, teniposide, vincristine -quinidine -testosterone -vaccines -verapamil Talk to your doctor or health care professional before taking any of these medicines: -acetaminophen -aspirin -ibuprofen -ketoprofen -naproxen This list may not describe all possible interactions. Give your health care provider a list of all the medicines, herbs, non-prescription drugs, or dietary supplements you use. Also tell them if you smoke, drink alcohol, or use illegal drugs. Some items may interact with your medicine. What should I watch for while using this medicine? Your condition will be monitored carefully while you are receiving this medicine. You will need important blood work done while you are taking this medicine. This medicine can cause serious allergic reactions. To reduce your risk you will need to take other medicine(s) before treatment with this medicine. If you experience allergic reactions like skin rash, itching or hives, swelling of the face, lips,  or tongue, tell your doctor or health care professional right away. In some cases, you may be given additional medicines to help  with side effects. Follow all directions for their use. This drug may make you feel generally unwell. This is not uncommon, as chemotherapy can affect healthy cells as well as cancer cells. Report any side effects. Continue your course of treatment even though you feel ill unless your doctor tells you to stop. Call your doctor or health care professional for advice if you get a fever, chills or sore throat, or other symptoms of a cold or flu. Do not treat yourself. This drug decreases your body's ability to fight infections. Try to avoid being around people who are sick. This medicine may increase your risk to bruise or bleed. Call your doctor or health care professional if you notice any unusual bleeding. Be careful brushing and flossing your teeth or using a toothpick because you may get an infection or bleed more easily. If you have any dental work done, tell your dentist you are receiving this medicine. Avoid taking products that contain aspirin, acetaminophen, ibuprofen, naproxen, or ketoprofen unless instructed by your doctor. These medicines may hide a fever. Do not become pregnant while taking this medicine. Women should inform their doctor if they wish to become pregnant or think they might be pregnant. There is a potential for serious side effects to an unborn child. Talk to your health care professional or pharmacist for more information. Do not breast-feed an infant while taking this medicine. Men are advised not to father a child while receiving this medicine. This product may contain alcohol. Ask your pharmacist or healthcare provider if this medicine contains alcohol. Be sure to tell all healthcare providers you are taking this medicine. Certain medicines, like metronidazole and disulfiram, can cause an unpleasant reaction when taken with alcohol. The reaction includes flushing, headache, nausea, vomiting, sweating, and increased thirst. The reaction can last from 30 minutes to several  hours. What side effects may I notice from receiving this medicine? Side effects that you should report to your doctor or health care professional as soon as possible: -allergic reactions like skin rash, itching or hives, swelling of the face, lips, or tongue -low blood counts - This drug may decrease the number of white blood cells, red blood cells and platelets. You may be at increased risk for infections and bleeding. -signs of infection - fever or chills, cough, sore throat, pain or difficulty passing urine -signs of decreased platelets or bleeding - bruising, pinpoint red spots on the skin, black, tarry stools, nosebleeds -signs of decreased red blood cells - unusually weak or tired, fainting spells, lightheadedness -breathing problems -chest pain -high or low blood pressure -mouth sores -nausea and vomiting -pain, swelling, redness or irritation at the injection site -pain, tingling, numbness in the hands or feet -slow or irregular heartbeat -swelling of the ankle, feet, hands Side effects that usually do not require medical attention (report to your doctor or health care professional if they continue or are bothersome): -bone pain -complete hair loss including hair on your head, underarms, pubic hair, eyebrows, and eyelashes -changes in the color of fingernails -diarrhea -loosening of the fingernails -loss of appetite -muscle or joint pain -red flush to skin -sweating This list may not describe all possible side effects. Call your doctor for medical advice about side effects. You may report side effects to FDA at 1-800-FDA-1088. Where should I keep my medicine? This drug is given in a hospital  or clinic and will not be stored at home. NOTE: This sheet is a summary. It may not cover all possible information. If you have questions about this medicine, talk to your doctor, pharmacist, or health care provider.  2018 [employer]/Gold Standard (2015-06-11 19:58:00) Carboplatin  injection What is this medicine? CARBOPLATIN (KAR boe pla tin) is a chemotherapy drug. It targets fast dividing cells, like cancer cells, and causes these cells to die. This medicine is used to treat ovarian cancer and many other cancers. This medicine may be used for other purposes; ask your health care provider or pharmacist if you have questions. COMMON BRAND NAME(S): Paraplatin What should I tell my health care provider before I take this medicine? They need to know if you have any of these conditions: -blood disorders -hearing problems -kidney disease -recent or ongoing radiation therapy -an unusual or allergic reaction to carboplatin, cisplatin, other chemotherapy, other medicines, foods, dyes, or preservatives -pregnant or trying to get pregnant -breast-feeding How should I use this medicine? This drug is usually given as an infusion into a vein. It is administered in a hospital or clinic by a specially trained health care professional. Talk to your pediatrician regarding the use of this medicine in children. Special care may be needed. Overdosage: If you think you have taken too much of this medicine contact a poison control center or emergency room at once. NOTE: This medicine is only for you. Do not share this medicine with others. What if I miss a dose? It is important not to miss a dose. Call your doctor or health care professional if you are unable to keep an appointment. What may interact with this medicine? -medicines for seizures -medicines to increase blood counts like filgrastim, pegfilgrastim, sargramostim -some antibiotics like amikacin, gentamicin, neomycin, streptomycin, tobramycin -vaccines Talk to your doctor or health care professional before taking any of these medicines: -acetaminophen -aspirin -ibuprofen -ketoprofen -naproxen This list may not describe all possible interactions. Give your health care provider a list of all the medicines, herbs,  non-prescription drugs, or dietary supplements you use. Also tell them if you smoke, drink alcohol, or use illegal drugs. Some items may interact with your medicine. What should I watch for while using this medicine? Your condition will be monitored carefully while you are receiving this medicine. You will need important blood work done while you are taking this medicine. This drug may make you feel generally unwell. This is not uncommon, as chemotherapy can affect healthy cells as well as cancer cells. Report any side effects. Continue your course of treatment even though you feel ill unless your doctor tells you to stop. In some cases, you may be given additional medicines to help with side effects. Follow all directions for their use. Call your doctor or health care professional for advice if you get a fever, chills or sore throat, or other symptoms of a cold or flu. Do not treat yourself. This drug decreases your body's ability to fight infections. Try to avoid being around people who are sick. This medicine may increase your risk to bruise or bleed. Call your doctor or health care professional if you notice any unusual bleeding. Be careful brushing and flossing your teeth or using a toothpick because you may get an infection or bleed more easily. If you have any dental work done, tell your dentist you are receiving this medicine. Avoid taking products that contain aspirin, acetaminophen, ibuprofen, naproxen, or ketoprofen unless instructed by your doctor. These medicines may hide a  fever. Do not become pregnant while taking this medicine. Women should inform their doctor if they wish to become pregnant or think they might be pregnant. There is a potential for serious side effects to an unborn child. Talk to your health care professional or pharmacist for more information. Do not breast-feed an infant while taking this medicine. What side effects may I notice from receiving this medicine? Side effects  that you should report to your doctor or health care professional as soon as possible: -allergic reactions like skin rash, itching or hives, swelling of the face, lips, or tongue -signs of infection - fever or chills, cough, sore throat, pain or difficulty passing urine -signs of decreased platelets or bleeding - bruising, pinpoint red spots on the skin, black, tarry stools, nosebleeds -signs of decreased red blood cells - unusually weak or tired, fainting spells, lightheadedness -breathing problems -changes in hearing -changes in vision -chest pain -high blood pressure -low blood counts - This drug may decrease the number of white blood cells, red blood cells and platelets. You may be at increased risk for infections and bleeding. -nausea and vomiting -pain, swelling, redness or irritation at the injection site -pain, tingling, numbness in the hands or feet -problems with balance, talking, walking -trouble passing urine or change in the amount of urine Side effects that usually do not require medical attention (report to your doctor or health care professional if they continue or are bothersome): -hair loss -loss of appetite -metallic taste in the mouth or changes in taste This list may not describe all possible side effects. Call your doctor for medical advice about side effects. You may report side effects to FDA at 1-800-FDA-1088. Where should I keep my medicine? This drug is given in a hospital or clinic and will not be stored at home. NOTE: This sheet is a summary. It may not cover all possible information. If you have questions about this medicine, talk to your doctor, pharmacist, or health care provider.  2018 Elsevier/Gold Standard (2007-11-15 14:38:05)

## 2018-05-25 NOTE — Progress Notes (Signed)
Pt severely claustrophobic. Pt was unable to get into mri scanner. Pt will need pre meds prior to mri scan. Pt to call provider and set up meds and to come back with meds and driver.

## 2018-05-25 NOTE — Progress Notes (Signed)
START ON PATHWAY REGIMEN - Non-Small Cell Lung     A cycle is every 21 days:     Paclitaxel      Carboplatin   **Always confirm dose/schedule in your pharmacy ordering system**  Patient Characteristics: Stage IV Metastatic, Squamous, PS = 0, 1, First Line, Not a Candidate for Immunotherapy AJCC T Category: T4 Current Disease Status: Distant Metastases AJCC N Category: N2 AJCC M Category: M1a AJCC 8 Stage Grouping: IVA Histology: Squamous Cell Line of therapy: First Line PD-L1 Expression Status: Awaiting Test Results Performance Status: PS = 0, 1 Immunotherapy Candidate Status: Not a Candidate for Immunotherapy Intent of Therapy: Non-Curative / Palliative Intent, Discussed with Patient

## 2018-05-25 NOTE — Progress Notes (Signed)
Hematology/Oncology Consult note [facility] [phone] [phone]   Patient Care Team: Glendon Axe, MD as PCP - General (Internal Medicine) Telford Nab, RN as Registered Nurse  REFERRING PROVIDER: Dr. Felicie Morn CHIEF COMPLAINTS/REASON FOR VISIT:  Evaluation of newly diagnosed lung cancer  HISTORY OF PRESENTING ILLNESS:  Tamara Parrish is a  62 y.o.  female with PMH listed below who was referred to me for evaluation of newly diagnosed lung cancer. Patient was admitted at the end of June and beginning of July for 2 times due to recurrent community-acquired pneumonia despite treating with antibiotics.  CT skin showed a left upper lobe consolidation suspicious malignancy enlarged bilateral hilar and mediastinal lymph nodes.  Patient was referred to see pulmonology Dr. Felicie Morn.   CT chest7/08/2017, in comparison with recent chest x-rays>>imaging personally reviewed, there are fibrotic changes of the lungs bibasilarly, greatest in the left, these are also seen in the lingula peripherally, right apex. There is masslike consolidation in the apical posterior segment of the left upper lobe. There is significant lymphadenopathy seen in the right hilar, subcarinal and right paratracheal area. There is a pulmonary arterial enlargement suggestive of pulmonary hypertension. Opacification of the left upper lobe appears advanced in comparison to previous chest x-ray 2 weeks prior 05/05/2018 CT chest,  with interval increase in size of large area of masslike consolidation within the left upper lobe which now appears to involve the adjacent chest wall as well as the left third and fourth ribs.  Mediastinal and hilar adenopathy, Patient underwent E bus bronchoscopy with E bus guided biopsy. Left upper lobe lavage was atypical cells consistent with squamous cell carcinoma. Left upper lobe brushing is positive for squamous cell carcinoma Subcarinal  lymph node biopsy negative for malignancy Right hilar lymph node biopsy negative for malignancy. Left upper lobe transbronchial biopsy positive for squamous cell carcinoma. Patient was referred to cancer center to establish cancer care with me. Today patient was accompanied by multiple family members including daughters and partner.  Denies any headache, shortness of breath, chest pain, abdominal pain, leg swelling.fever or chill.  Has history of rheumatoid arthritis follows up with Dr. Meda Coffee at [facility] clinic Patient is on methotrexate  weekly, and  Remicade treatment. Reports persistent left posterior chest wall pain, pleuritic, i worsened with deep breath. She lives with daughters  Review of Systems  Constitutional: Negative for chills, fever, malaise/fatigue and weight loss.  HENT: Negative for nosebleeds and sore throat.   Eyes: Negative for double vision, photophobia and redness.  Respiratory: Negative for cough, shortness of breath and wheezing.        Left posterior chest wall pain.    Cardiovascular: Negative for chest pain, palpitations and orthopnea.  Gastrointestinal: Negative for abdominal pain, blood in stool, nausea and vomiting.  Genitourinary: Negative for dysuria.  Musculoskeletal: Negative for back pain, myalgias and neck pain.  Skin: Negative for itching and rash.  Neurological: Negative for dizziness, tingling and tremors.  Endo/Heme/Allergies: Negative for environmental allergies. Does not bruise/bleed easily.  Psychiatric/Behavioral: Negative for depression.    MEDICAL HISTORY:  Past Medical History:  Diagnosis Date  . DM2 (diabetes mellitus, type 2) ([place])   . Dyspnea   . Hyperlipemia   . Hypertension   . Osteomyelitis ([place])   . Pneumonia   . RA (rheumatoid arthritis) ([place])   . Rheumatoid arthritis ([place])     SURGICAL HISTORY: Past Surgical History:  Procedure Laterality Date  . ENDOBRONCHIAL ULTRASOUND N/A 05/13/2018   Procedure: ENDOBRONCHIAL  ULTRASOUND;  Surgeon: [name], MD;  Location: [facility] ORS;  Service: Pulmonary;  Laterality: N/A;  . TOE SURGERY      SOCIAL HISTORY: Social History   Socioeconomic History  . Marital status: Single    Spouse name: Not on file  . Number of children: 2  . Years of education: Not on file  . Highest education level: Not on file  Occupational History  . Not on file  Social Needs  . Financial resource strain: Not on file  . Food insecurity:    Worry: Not on file    Inability: Not on file  . Transportation needs:    Medical: Not on file    Non-medical: Not on file  Tobacco Use  . Smoking status: Former Smoker    Packs/day: 1.00    Years: 40.00    Pack years: 40.00    Types: Cigarettes    Last attempt to quit: 11/22/2017    Years since quitting: 0.5  . Smokeless tobacco: Never Used  . Tobacco comment: quit 2 weeks ago  Substance and Sexual Activity  . Alcohol use: Never    Frequency: Never  . Drug use: Never  . Sexual activity: Not on file  Lifestyle  . Physical activity:    Days per week: Not on file    Minutes per session: Not on file  . Stress: Not on file  Relationships  . Social connections:    Talks on phone: Not on file    Gets together: Not on file    Attends religious service: Not on file    Active member of club or organization: Not on file    Attends meetings of clubs or organizations: Not on file    Relationship status: Not on file  . Intimate partner violence:    Fear of current or ex partner: Not on file    Emotionally abused: Not on file    Physically abused: Not on file    Forced sexual activity: Not on file  Other Topics Concern  . Not on file  Social History Narrative   Living at home with daughter.  Ambulates with a walker at baseline.    FAMILY HISTORY: Family History  Problem Relation Age of Onset  . Diabetes Mother   . Lung cancer Father     ALLERGIES:  is allergic to ibuprofen and penicillins.  MEDICATIONS:  Current  Outpatient Medications  Medication Sig Dispense Refill  . albuterol (PROVENTIL HFA;VENTOLIN HFA) 108 (90 Base) MCG/ACT inhaler Inhale 2 puffs into the lungs every 6 (six) hours as needed for wheezing or shortness of breath. 1 Inhaler 2  . amLODipine (NORVASC) 5 MG tablet Take 5 mg by mouth daily.  3  . aspirin 81 MG chewable tablet Chew 1 tablet by mouth daily.    Marland Kitchen atorvastatin (LIPITOR) 40 MG tablet Take 40 mg by mouth daily.  3  . folic acid (FOLVITE) 1 MG tablet Take 1 mg by mouth daily.  11  . guaiFENesin-dextromethorphan (ROBITUSSIN DM) 100-10 MG/5ML syrup Take 5 mLs by mouth every 4 (four) hours as needed for cough. 118 mL 0  . Insulin Glargine (BASAGLAR KWIKPEN) 100 UNIT/ML SOPN Inject 38 Units into the skin every morning.   12  . lisinopril (PRINIVIL,ZESTRIL) 40 MG tablet Take 40 mg by mouth daily.  3  . metFORMIN (GLUCOPHAGE) 1000 MG tablet Take 1,000 mg by mouth 2 (two) times daily.  5  . methotrexate (RHEUMATREX) 2.5 MG tablet Take 2.5 mg by  mouth once a week. Caution:Chemotherapy. Protect from light.    . metoprolol tartrate (LOPRESSOR) 25 MG tablet Take 25 mg by mouth 2 (two) times daily.  3  . ondansetron (ZOFRAN-ODT) 4 MG disintegrating tablet Take 4 mg by mouth every 8 (eight) hours as needed for nausea.     Marland Kitchen triamterene-hydrochlorothiazide (DYAZIDE) 37.5-25 MG capsule Take 1 capsule by mouth daily.  3  . oxyCODONE (ROXICODONE) 5 MG immediate release tablet Take 1 tablet (5 mg total) by mouth every 8 (eight) hours as needed for severe pain. 30 tablet 0   No current facility-administered medications for this visit.      PHYSICAL EXAMINATION: ECOG PERFORMANCE STATUS: 1 - Symptomatic but completely ambulatory Vitals:   05/25/18 1115  BP: (!) 153/76  Pulse: 82  Resp: 16  Temp: (!) 96.1 F (35.6 C)  SpO2: 96%   Filed Weights   05/25/18 1115  Weight: 182 lb 7 oz (82.8 kg)    Physical Exam  Constitutional: She is oriented to person, place, and time. No distress.    HENT:  Head: Normocephalic and atraumatic.  Mouth/Throat: Oropharynx is clear and moist.  Eyes: Pupils are equal, round, and reactive to light. EOM are normal. No scleral icterus.  Neck: Normal range of motion. Neck supple.  Cardiovascular: Normal rate, regular rhythm and normal heart sounds.  Pulmonary/Chest: Effort normal. No respiratory distress. She has no wheezes. She exhibits tenderness.  Decreased breath sounds bilaterally  Abdominal: Soft. She exhibits no distension and no mass. There is no tenderness.  Musculoskeletal: Normal range of motion. She exhibits no edema or deformity.  Neurological: She is alert and oriented to person, place, and time. No cranial nerve deficit. Coordination normal.  Skin: Skin is warm and dry. No rash noted. No erythema.  Psychiatric: She has a normal mood and affect.     LABORATORY DATA:  I have reviewed the data as listed Lab Results  Component Value Date   WBC 13.5 (H) 05/25/2018   HGB 10.2 (L) 05/25/2018   HCT 30.8 (L) 05/25/2018   MCV 87.1 05/25/2018   PLT 575 (H) 05/25/2018   Recent Labs    02/07/18 1848  02/21/18 1835 02/22/18 0459 05/25/18 1207  NA 133*   < > 135 137 134*  K 4.1   < > 4.6 4.5 3.7  CL 102   < > 101 104 100  CO2 20*   < > 25 27 24   GLUCOSE 194*   < > 198* 63* 269*  BUN 29*   < > 23 22 19   CREATININE 1.09*   < > 1.06* 0.72 0.92  CALCIUM 8.6*   < > 9.1 9.0 9.0  GFRNONAA 53*   < > 55* >60 >60  GFRAA >60   < > >60 >60 >60  PROT 7.7  --  8.3*  --  8.0  ALBUMIN 2.3*  --  2.5*  --  2.9*  AST 25  --  20  --  16  ALT 11*  --  6  --  9  ALKPHOS 104  --  83  --  93  BILITOT 0.3  --  0.6  --  0.4   < > = values in this interval not displayed.   Iron/TIBC/Ferritin/ %Sat    Component Value Date/Time   IRON 27 (L) 05/25/2018 1207   TIBC 241 (L) 05/25/2018 1207   FERRITIN 181 05/25/2018 1207   IRONPCTSAT 11 05/25/2018 1207     RADIOGRAPHIC STUDIES: I have  personally reviewed the radiological images as listed and  agreed with the findings in the report. 05/24/2018 PET scan  1. Highly hypermetabolic left upper lobe mass, maximum SUV 16.9, with chest wall invasion of the left third and fourth ribs and into the third intercostal space. 2. Separate pleural tumor deposit on the left at the T3-4 level medially near the neural foramen. 3. Hypermetabolic AP window lymph nodes. Probable left hilar lymph nodes confluent with the mass. Separate tumor nodules are noted in the left upper lobe. 4. Hypermetabolic sacrococcygeal junction without a well-defined lesion, probably from a fracture, less likely from early metastatic disease.   ASSESSMENT & PLAN:  1. Malignant neoplasm of lung, unspecified laterality, unspecified part of lung ([place])   2. Goals of care, counseling/discussion   3. Rheumatoid arthritis, involving unspecified site, unspecified rheumatoid factor presence ([place])   4. Thrombocytosis ([place])   5. Anemia, unspecified type    # CT image and PET scan image were independently reviewed by me and discussed with patient.  Also discussed with patient about pathology reports. 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  Discussed with radiologist, patient has T4N2M1A disease at least. The squamous cancer diagnosis and care plan were discussed with patient in detail.  NCCN guidelines were reviewed and shared with patient.  The goal of treatment which is to palliate disease, disease related symptoms, improve quality of life and hopefully prolong life was highlighted in our discussion.  Chemotherapy education was provided.  We had discussed the composition of chemotherapy regimen [carboplatin and Taxol] , length of chemo cycle, duration of treatment and the time to assess response to treatment.  Supportive care measures are necessary for patient well-being and will be provided as necessary.  I explained to the patient the risks and benefits of chemotherapy including all but not limited to hair loss, mouth sore, nausea,  vomiting, low blood counts, bleeding, and risk of life threatening infection and even death, secondary malignancy, neuropathy etc.   Patient voices understanding and willing to proceed chemotherapy.   # Chemotherapy education; referred to Dr. Lucky Cowboy vascular surgery for port placement. Hopefully the planned start chemotherapy next week. Antiemetics-Zofran and Compazine; EMLA cream sent to pharmacy  Hold immunotherapy due to rheumatoid arthritis.  Will touch base with patient's rheumatologist at [facility] clinic  #Proceed with MRI of brain to complete work-up. #Biopsy sample was not enough for ancillary testing.  Will send foundation liquid biopsy.  # Local chest wall invasion/neoplasm pain: reports pain is not controlled with tylenol.  Send Rx of oxycodone 5mg  Q8h PRN to pharmacy.   Discussed the potential side effects and treatment/ prevention  of constipation,  respiratory depression,  and mental status changes. Refer to Rad Onc for palliative RT.   # Anemia and thrombocytosis. Previous labs reviewed by me.  Check CBC and CMP, iron TIBC, ferritin.   We spent sufficient time to discuss many aspect of care, questions were answered to patient's satisfaction.   Orders Placed This Encounter  Procedures  . MR Brain W Wo Contrast    Standing Status:   Future    Number of Occurrences:   1    Standing Expiration Date:   05/25/2019    Order Specific Question:   ** REASON FOR EXAM (FREE TEXT)    Answer:   Non-small cell lung cancer, stage IIB/IIIA/IV, separate pulmonary nodules, pre-treatment eval    Order Specific Question:   If indicated for the ordered procedure, I authorize the administration of contrast media per  Radiology protocol    Answer:   Yes    Order Specific Question:   What is the patient's sedation requirement?    Answer:   No Sedation    Order Specific Question:   Does the patient have a pacemaker or implanted devices?    Answer:   No    Order Specific Question:   Use SRS  Protocol?    Answer:   No    Order Specific Question:   Call Results- Best Contact Number?    Answer:   [phone]....Marland KitchenPT CAN LEAVE    Order Specific Question:   Radiology Contrast Protocol - do NOT remove file path    Answer:   [email or web address]    Order Specific Question:   Preferred imaging location?    Answer:   [facility] (table limit-300lbs)  . CBC with Differential/Platelet    Standing Status:   Future    Number of Occurrences:   1    Standing Expiration Date:   05/26/2019  . Iron and TIBC    Standing Status:   Future    Number of Occurrences:   1    Standing Expiration Date:   05/26/2019  . Ferritin    Standing Status:   Future    Number of Occurrences:   1    Standing Expiration Date:   05/26/2019  . Comprehensive metabolic panel    Standing Status:   Future    Number of Occurrences:   1    Standing Expiration Date:   05/26/2019  . Ambulatory referral to Vascular Surgery    Referral Priority:   Routine    Referral Type:   Surgical    Referral Reason:   Specialty Services Required    Requested Specialty:   Vascular Surgery    Number of Visits Requested:   1  . Ambulatory referral to Radiation Oncology    Referral Priority:   Routine    Referral Type:   Consultation    Referral Reason:   Specialty Services Required    Referred to Provider:   Noreene Filbert, MD    Requested Specialty:   Radiation Oncology    Number of Visits Requested:   1    All questions were answered. The patient knows to call the clinic with any problems questions or concerns.  Return of visit: on day 1 of chemotherapy.  Thank you for this kind referral and the opportunity to participate in the care of this patient. A copy of today's note is routed to referring provider    Earlie Server, MD, PhD Hematology Oncology [facility] at [facility] Pager- [phone] 05/25/2018

## 2018-05-26 ENCOUNTER — Other Ambulatory Visit: Payer: Self-pay | Admitting: *Deleted

## 2018-05-26 ENCOUNTER — Other Ambulatory Visit (INDEPENDENT_AMBULATORY_CARE_PROVIDER_SITE_OTHER): Payer: Self-pay | Admitting: Vascular Surgery

## 2018-05-26 ENCOUNTER — Inpatient Hospital Stay: Payer: BLUE CROSS/BLUE SHIELD

## 2018-05-26 DIAGNOSIS — C3490 Malignant neoplasm of unspecified part of unspecified bronchus or lung: Secondary | ICD-10-CM

## 2018-05-26 MED ORDER — LORAZEPAM 1 MG PO TABS: ORAL_TABLET | ORAL | 0 refills | Status: DC

## 2018-05-26 MED ORDER — CLINDAMYCIN PHOSPHATE 300 MG/50ML IV SOLN: 300.0000 mg | Freq: Once | INTRAVENOUS | Status: DC

## 2018-05-27 ENCOUNTER — Encounter
Admission: RE | Disposition: A | Discharge status: Home / Self Care | Payer: Self-pay | Source: Ambulatory Visit | Attending: Vascular Surgery

## 2018-05-27 ENCOUNTER — Ambulatory Visit
Admission: RE | Admit: 2018-05-27 | Discharge: 2018-05-27 | Disposition: A | Discharge status: Home / Self Care | Payer: BLUE CROSS/BLUE SHIELD | Source: Ambulatory Visit | Attending: Vascular Surgery | Admitting: Vascular Surgery

## 2018-05-27 DIAGNOSIS — C3492 Malignant neoplasm of unspecified part of left bronchus or lung: Secondary | ICD-10-CM

## 2018-05-27 DIAGNOSIS — C7989 Secondary malignant neoplasm of other specified sites: Secondary | ICD-10-CM | POA: Diagnosis not present

## 2018-05-27 DIAGNOSIS — C3490 Malignant neoplasm of unspecified part of unspecified bronchus or lung: Secondary | ICD-10-CM | POA: Insufficient documentation

## 2018-05-27 HISTORY — PX: PORTA CATH INSERTION: CATH118285

## 2018-05-27 LAB — GLUCOSE, CAPILLARY: GLUCOSE-CAPILLARY: 155 mg/dL — AB (ref 70–99)

## 2018-05-27 SURGERY — PORTA CATH INSERTION
Anesthesia: Moderate Sedation

## 2018-05-27 MED ORDER — SODIUM CHLORIDE 0.9 % IV SOLN
Freq: Once | INTRAVENOUS | Status: DC
  Filled 2018-05-27: qty 2

## 2018-05-27 MED ORDER — FENTANYL CITRATE (PF) 100 MCG/2ML IJ SOLN
INTRAMUSCULAR | Status: AC
  Filled 2018-05-27: qty 2

## 2018-05-27 MED ORDER — CLINDAMYCIN PHOSPHATE 300 MG/50ML IV SOLN
INTRAVENOUS | Status: AC
  Filled 2018-05-27: qty 50

## 2018-05-27 MED ORDER — TRAMADOL HCL 50 MG PO TABS: 50.0000 mg | ORAL_TABLET | Freq: Four times a day (QID) | ORAL | 0 refills | Status: DC | PRN

## 2018-05-27 MED ORDER — MIDAZOLAM HCL 2 MG/2ML IJ SOLN
INTRAMUSCULAR | Status: DC | PRN
  Administered 2018-05-27: 2 mg via INTRAVENOUS
  Administered 2018-05-27 (×9): 1 mg via INTRAVENOUS

## 2018-05-27 MED ORDER — MIDAZOLAM HCL 2 MG/2ML IJ SOLN
INTRAMUSCULAR | Status: AC
  Filled 2018-05-27: qty 2

## 2018-05-27 MED ORDER — HYDROMORPHONE HCL 1 MG/ML IJ SOLN: 1.0000 mg | Freq: Once | INTRAMUSCULAR | Status: DC | PRN

## 2018-05-27 MED ORDER — LIDOCAINE-EPINEPHRINE (PF) 1 %-1:200000 IJ SOLN
INTRAMUSCULAR | Status: AC
  Filled 2018-05-27: qty 30

## 2018-05-27 MED ORDER — HEPARIN (PORCINE) IN NACL 1000-0.9 UT/500ML-% IV SOLN
INTRAVENOUS | Status: AC
  Filled 2018-05-27: qty 500

## 2018-05-27 MED ORDER — MIDAZOLAM HCL 5 MG/5ML IJ SOLN
INTRAMUSCULAR | Status: AC
  Filled 2018-05-27: qty 5

## 2018-05-27 MED ORDER — FENTANYL CITRATE (PF) 100 MCG/2ML IJ SOLN
INTRAMUSCULAR | Status: DC | PRN
  Administered 2018-05-27: 25 ug via INTRAVENOUS
  Administered 2018-05-27 (×2): 50 ug via INTRAVENOUS
  Administered 2018-05-27 (×2): 25 ug via INTRAVENOUS
  Administered 2018-05-27: 50 ug via INTRAVENOUS
  Administered 2018-05-27 (×3): 25 ug via INTRAVENOUS

## 2018-05-27 MED ORDER — ONDANSETRON HCL 4 MG/2ML IJ SOLN: 4.0000 mg | Freq: Four times a day (QID) | INTRAMUSCULAR | Status: DC | PRN

## 2018-05-27 MED ORDER — SODIUM CHLORIDE 0.9 % IV SOLN
INTRAVENOUS | Status: DC
  Administered 2018-05-27: 09:00:00 via INTRAVENOUS

## 2018-05-27 SURGICAL SUPPLY — 12 items
DEVICE TORQUE .025-.038 (MISCELLANEOUS) ×3 IMPLANT
DRAPE INCISE IOBAN 66X45 STRL (DRAPES) ×3 IMPLANT
GLIDEWIRE STIFF .35X180X3 HYDR (WIRE) ×3 IMPLANT
GUIDEWIRE ANGLED .035 180CM (WIRE) ×3 IMPLANT
KIT PORT POWER 8FR ISP CVUE (Port) ×6 IMPLANT
NEEDLE ENTRY 21GA 7CM ECHOTIP (NEEDLE) ×3 IMPLANT
PACK ANGIOGRAPHY (CUSTOM PROCEDURE TRAY) ×3 IMPLANT
SET INTRO CAPELLA COAXIAL (SET/KITS/TRAYS/PACK) ×3 IMPLANT
SUT MNCRL AB 4-0 PS2 18 (SUTURE) ×3 IMPLANT
SUT PROLENE 0 CT 1 30 (SUTURE) ×3 IMPLANT
SUT VIC AB 3-0 SH 27 (SUTURE) ×2
SUT VIC AB 3-0 SH 27X BRD (SUTURE) ×1 IMPLANT

## 2018-05-27 NOTE — H&P (Signed)
[facility] History & Physical Update  The patient was interviewed and re-examined.  The patient's previous History and Physical has been reviewed and is unchanged.    Patient has left-sided lung carcinoma with chest wall invasion.  She will be starting chemotherapy and therefore requires Infuse-a-Port.  Risks and benefits as well as the indication for the port of been discussed with the patient.  She wishes to proceed.  There is no change in the plan of care. We plan to proceed with the scheduled procedure.  Hortencia Pilar, MD  05/27/2018, 8:56 AM

## 2018-05-27 NOTE — Op Note (Signed)
OPERATIVE NOTE   PROCEDURE: 1. Placement of a right IJ Infuse-a-Port  PRE-OPERATIVE DIAGNOSIS: Lung carcinoma  POST-OPERATIVE DIAGNOSIS: Same  SURGEON: Katha Cabal M.D.  ANESTHESIA: Conscious sedation was administered under my direct supervision by the interventional radiology RN. IV Versed plus fentanyl were utilized. Continuous ECG, pulse oximetry and blood pressure was monitored throughout the entire procedure. Conscious sedation was for a total of 67 minutes.  ESTIMATED BLOOD LOSS: Minimal   FINDING(S): 1.  Patent vein  SPECIMEN(S): None  INDICATIONS:   Tamara Parrish is a 62 y.o. female who presents with lung carcinoma now requiring chemotherapy and therefore an Infuse-a-Port will be placed.  Risks and benefits are reviewed she agrees to proceed..  DESCRIPTION: After obtaining full informed written consent, the patient was brought back to the special procedure suite and placed in the supine position. The patient's right neck and chest wall are prepped and draped in sterile fashion. Appropriate timeout was called.  Ultrasound is placed in a sterile sleeve, ultrasound is utilized to avoid vascular injury as well as secondary to lack of appropriate landmarks. The right internal jugular vein is identified. It is echolucent and homogeneous as well as easily compressible indicating patency. An image is recorded for the permanent record.  Access to the vein with a micropuncture needle is done under direct ultrasound visualization.  1% lidocaine is infiltrated into the soft tissue at the base of the neck as well as on the chest wall.  Under direct ultrasound visualization a micro-needle is inserted into the vein followed by the micro-wire. Micro-sheath was then advanced and a J wire is inserted without difficulty under fluoroscopic guidance. A small counterincision was created at the wire insertion site. A transverse incision is created 2 fingerbreadths below the scapula and a  pocket is fashioned using both blunt and sharp dissection. The pocket is tested for appropriate size with the hub of the Infuse-a-Port. The tunneling device is then used to pull the intravascular portion of the catheter from the pocket to the neck counterincision.  Dilator and peel-away sheath were then inserted over the wire and the wire is removed. Catheter is then advanced into the venous system without difficulty. Peel-away sheath was then removed.  Catheter is then positioned under fluoroscopic guidance at the atrial caval junction. It is then transected connected to the hub and the hope is slipped into the subcutaneous pocket on the chest wall. The hub was then accessed percutaneously and it would not aspirate.  Fluoroscopy showed significant redundancy with a kink in the catheter.  A series of maneuvers were attempted using glide wires to straighten the catheter and advance it back into position also at one point it was re-tunneled ultimately I could not get the catheter to have a smooth contour.  A new port was delivered onto the field and the ultrasound placed back into a sterile sleeve.  The base of the right neck was then infiltrated with lidocaine.  Ultrasound was used to identify the jugular vein.  It was echolucent compressible.  Images recorded for the permanent record.  Under direct ultrasound visualization through the previous neck counterincision a micropuncture needle was used to access the jugular vein and the microwire advanced under fluoroscopic guidance.  Micro-sheath followed by microwire was then placed.  Tunneling device was then used to pull the new catheter from the existing pocket out the counterincision.  J-wire was advanced under fluoroscopic guidance followed by the peel-away sheath and then the catheter was advanced through the peel-away  sheath.  The peel-away sheath was removed.  Catheter was then positioned under fluoroscopy trimmed to the appropriate length and the port  connected.  It was then slipped into the pocket without difficulty.  Under fluoroscopy the port had a good position the catheter was smooth in contour and free of kinks.  It aspirates and flushes easily and flushes well and is flushed with 30 cc of heparinized saline. The pocket incision is then closed in layers using interrupted 3-0 Vicryl for the subcutaneous tissues and 4-0 Monocryl subcuticular for skin closure. Dermabond is applied. The neck counterincision was closed with 4-0 Monocryl subcuticular and Dermabond as well.  The patient tolerated the procedure well and there were no immediate complications.  COMPLICATIONS: None  CONDITION: Unchanged  Katha Cabal M.D. [place] [facility] Office: [phone]   05/27/2018, 11:26 AM

## 2018-05-28 ENCOUNTER — Encounter (HOSPITAL_COMMUNITY): Payer: Self-pay

## 2018-05-28 ENCOUNTER — Ambulatory Visit (HOSPITAL_COMMUNITY)
Admission: RE | Admit: 2018-05-28 | Discharge: 2018-05-28 | Disposition: A | Discharge status: Home / Self Care | Payer: BLUE CROSS/BLUE SHIELD | Source: Ambulatory Visit | Attending: Oncology | Admitting: Oncology

## 2018-05-28 DIAGNOSIS — C3490 Malignant neoplasm of unspecified part of unspecified bronchus or lung: Secondary | ICD-10-CM

## 2018-05-31 ENCOUNTER — Encounter: Payer: Self-pay | Admitting: Oncology

## 2018-05-31 ENCOUNTER — Inpatient Hospital Stay (HOSPITAL_BASED_OUTPATIENT_CLINIC_OR_DEPARTMENT_OTHER): Payer: BLUE CROSS/BLUE SHIELD | Admitting: Oncology

## 2018-05-31 ENCOUNTER — Other Ambulatory Visit: Payer: Self-pay

## 2018-05-31 ENCOUNTER — Encounter: Payer: Self-pay | Admitting: *Deleted

## 2018-05-31 ENCOUNTER — Inpatient Hospital Stay: Payer: BLUE CROSS/BLUE SHIELD

## 2018-05-31 ENCOUNTER — Inpatient Hospital Stay: Payer: BLUE CROSS/BLUE SHIELD | Admitting: Oncology

## 2018-05-31 VITALS — BP 154/80 | HR 70 | Temp 96.3°F | Resp 18 | Wt 183.4 lb

## 2018-05-31 VITALS — BP 127/74 | HR 82 | Resp 18

## 2018-05-31 DIAGNOSIS — C3412 Malignant neoplasm of upper lobe, left bronchus or lung: Secondary | ICD-10-CM

## 2018-05-31 DIAGNOSIS — C3490 Malignant neoplasm of unspecified part of unspecified bronchus or lung: Secondary | ICD-10-CM

## 2018-05-31 DIAGNOSIS — E1122 Type 2 diabetes mellitus with diabetic chronic kidney disease: Secondary | ICD-10-CM

## 2018-05-31 DIAGNOSIS — J449 Chronic obstructive pulmonary disease, unspecified: Secondary | ICD-10-CM

## 2018-05-31 DIAGNOSIS — C778 Secondary and unspecified malignant neoplasm of lymph nodes of multiple regions: Secondary | ICD-10-CM | POA: Diagnosis not present

## 2018-05-31 DIAGNOSIS — Z5111 Encounter for antineoplastic chemotherapy: Secondary | ICD-10-CM

## 2018-05-31 DIAGNOSIS — D473 Essential (hemorrhagic) thrombocythemia: Secondary | ICD-10-CM

## 2018-05-31 DIAGNOSIS — I129 Hypertensive chronic kidney disease with stage 1 through stage 4 chronic kidney disease, or unspecified chronic kidney disease: Secondary | ICD-10-CM

## 2018-05-31 DIAGNOSIS — M069 Rheumatoid arthritis, unspecified: Secondary | ICD-10-CM

## 2018-05-31 DIAGNOSIS — D75839 Thrombocytosis, unspecified: Secondary | ICD-10-CM

## 2018-05-31 DIAGNOSIS — G4700 Insomnia, unspecified: Secondary | ICD-10-CM

## 2018-05-31 DIAGNOSIS — Z87891 Personal history of nicotine dependence: Secondary | ICD-10-CM

## 2018-05-31 DIAGNOSIS — D649 Anemia, unspecified: Secondary | ICD-10-CM

## 2018-05-31 DIAGNOSIS — Z7189 Other specified counseling: Secondary | ICD-10-CM

## 2018-05-31 DIAGNOSIS — E875 Hyperkalemia: Secondary | ICD-10-CM

## 2018-05-31 DIAGNOSIS — F419 Anxiety disorder, unspecified: Secondary | ICD-10-CM

## 2018-05-31 DIAGNOSIS — N189 Chronic kidney disease, unspecified: Secondary | ICD-10-CM

## 2018-05-31 DIAGNOSIS — Z7984 Long term (current) use of oral hypoglycemic drugs: Secondary | ICD-10-CM

## 2018-05-31 LAB — CBC WITH DIFFERENTIAL/PLATELET
BASOS ABS: 0 10*3/uL (ref 0–0.1)
Basophils Relative: 0 %
EOS ABS: 0.3 10*3/uL (ref 0–0.7)
EOS PCT: 2 %
HCT: 28.6 % — ABNORMAL LOW (ref 35.0–47.0)
Hemoglobin: 9.5 g/dL — ABNORMAL LOW (ref 12.0–16.0)
LYMPHS ABS: 2.1 10*3/uL (ref 1.0–3.6)
Lymphocytes Relative: 15 %
MCH: 29.1 pg (ref 26.0–34.0)
MCHC: 33.1 g/dL (ref 32.0–36.0)
MCV: 87.8 fL (ref 80.0–100.0)
Monocytes Absolute: 0.9 10*3/uL (ref 0.2–0.9)
Monocytes Relative: 7 %
Neutro Abs: 10.4 10*3/uL — ABNORMAL HIGH (ref 1.4–6.5)
Neutrophils Relative %: 76 %
Platelets: 573 10*3/uL — ABNORMAL HIGH (ref 150–440)
RBC: 3.26 MIL/uL — AB (ref 3.80–5.20)
RDW: 15.9 % — AB (ref 11.5–14.5)
WBC: 13.8 10*3/uL — AB (ref 3.6–11.0)

## 2018-05-31 LAB — COMPREHENSIVE METABOLIC PANEL
ALT: 6 U/L (ref 0–44)
AST: 18 U/L (ref 15–41)
Albumin: 2.8 g/dL — ABNORMAL LOW (ref 3.5–5.0)
Alkaline Phosphatase: 94 U/L (ref 38–126)
Anion gap: 9 (ref 5–15)
BILIRUBIN TOTAL: 0.4 mg/dL (ref 0.3–1.2)
BUN: 15 mg/dL (ref 8–23)
CHLORIDE: 101 mmol/L (ref 98–111)
CO2: 25 mmol/L (ref 22–32)
CREATININE: 0.82 mg/dL (ref 0.44–1.00)
Calcium: 9.6 mg/dL (ref 8.9–10.3)
GFR calc Af Amer: >60 mL/min (ref >60)
Glucose, Bld: 198 mg/dL — ABNORMAL HIGH (ref 70–99)
POTASSIUM: 4.2 mmol/L (ref 3.5–5.1)
Sodium: 135 mmol/L (ref 135–145)
TOTAL PROTEIN: 7.6 g/dL (ref 6.5–8.1)

## 2018-05-31 MED ORDER — SODIUM CHLORIDE 0.9 % IV SOLN
Freq: Once | INTRAVENOUS | Status: AC
  Administered 2018-05-31: 10:00:00 via INTRAVENOUS
  Filled 2018-05-31: qty 250

## 2018-05-31 MED ORDER — DIPHENHYDRAMINE HCL 50 MG/ML IJ SOLN
50.0000 mg | Freq: Once | INTRAMUSCULAR | Status: AC
  Administered 2018-05-31: 50 mg via INTRAVENOUS
  Filled 2018-05-31: qty 1

## 2018-05-31 MED ORDER — FERROUS SULFATE 325 (65 FE) MG PO TBEC: 325.0000 mg | DELAYED_RELEASE_TABLET | Freq: Two times a day (BID) | ORAL | 3 refills | Status: DC

## 2018-05-31 MED ORDER — SODIUM CHLORIDE 0.9 % IV SOLN
175.0000 mg/m2 | Freq: Once | INTRAVENOUS | Status: AC
  Administered 2018-05-31: 342 mg via INTRAVENOUS
  Filled 2018-05-31: qty 57

## 2018-05-31 MED ORDER — SODIUM CHLORIDE 0.9 % IV SOLN
693.6000 mg | Freq: Once | INTRAVENOUS | Status: AC
  Administered 2018-05-31: 690 mg via INTRAVENOUS
  Filled 2018-05-31: qty 69

## 2018-05-31 MED ORDER — HEPARIN SOD (PORK) LOCK FLUSH 100 UNIT/ML IV SOLN
500.0000 [IU] | INTRAVENOUS | Status: AC | PRN
  Administered 2018-05-31: 500 [IU]
  Filled 2018-05-31: qty 5

## 2018-05-31 MED ORDER — MIRTAZAPINE 7.5 MG PO TABS: 7.5000 mg | ORAL_TABLET | Freq: Every day | ORAL | 0 refills | Status: DC

## 2018-05-31 MED ORDER — SODIUM CHLORIDE 0.9 % IV SOLN
20.0000 mg | Freq: Once | INTRAVENOUS | Status: AC
  Administered 2018-05-31: 20 mg via INTRAVENOUS
  Filled 2018-05-31: qty 2

## 2018-05-31 MED ORDER — SODIUM CHLORIDE 0.9% FLUSH
10.0000 mL | INTRAVENOUS | Status: AC | PRN
  Administered 2018-05-31: 10 mL
  Filled 2018-05-31: qty 10

## 2018-05-31 MED ORDER — ALPRAZOLAM 0.25 MG PO TABS: 0.2500 mg | ORAL_TABLET | Freq: Two times a day (BID) | ORAL | 0 refills | Status: DC | PRN

## 2018-05-31 MED ORDER — PALONOSETRON HCL INJECTION 0.25 MG/5ML
0.2500 mg | Freq: Once | INTRAVENOUS | Status: AC
  Administered 2018-05-31: 0.25 mg via INTRAVENOUS
  Filled 2018-05-31: qty 5

## 2018-05-31 MED ORDER — FAMOTIDINE IN NACL 20-0.9 MG/50ML-% IV SOLN
20.0000 mg | Freq: Once | INTRAVENOUS | Status: AC
  Administered 2018-05-31: 20 mg via INTRAVENOUS
  Filled 2018-05-31: qty 50

## 2018-05-31 NOTE — Progress Notes (Signed)
Well Chemo Visit  Subjective:  Patient ID: Tamara Parrish, female    DOB: Aug 31, 1955  Age: 62 y.o. MRN: [id number]  CC: Chemo Care  HPI Tamara Parrish presents for [facility] for initial meeting and discussion in preparation of starting chemotherapy. We discussed that the role of the [facility] is to provide additional resources and assistance those who may have an increased risk for complications during the course of chemotherapy. High risk factors may include advanced age, performance status and/or comorbid conditions such as hypertension, DM and CKD.  We discussed the relationship with her primary care physician, available insurance, financial concerns/needs, access to medications and potential transportation issues.  Discussed that the goal of this program is to help prevent unplanned ER visits and to help reduce complications during chemotherapy. We introduced symptom management clinic and their availability for same day appointment should problems arise here at the cancer center.   Patient referred by Dr. Felicie Morn after patient was admitted to the hospital at the end of June 2019 for recurrent community-acquired pneumonia despite antibiotics.  Had CT scan while inpatient revealing a left upper lobe consolidation suspicious malignancy and bilateral hilar and mediastinal lymph nodes.  Subsequently, had bronchoscopy revealing squamous cell carcinoma.  Discussed beginning chemotherapy with carbo/Taxol on 05/31/2018.  Had port placed on 05/27/18.   History Patient Active Problem List   Diagnosis Date Noted  . Goals of care, counseling/discussion 05/25/2018  . Malignant neoplasm of lung ([place]) 05/25/2018  . Recurrent pneumonia 02/21/2018  . PNA (pneumonia) 02/07/2018  . Essential hypertension 10/09/2016  . Rheumatoid arthritis, seropositive ([place]) 03/27/2016  . Polyarthralgia 03/18/2016  . Diabetes mellitus type 2, uncomplicated ([place]) 50/93/2671  . Allergic  rhinitis 05/18/2014   Past Medical History:  Diagnosis Date  . DM2 (diabetes mellitus, type 2) ([place])   . Dyspnea   . Hyperlipemia   . Hypertension   . Osteomyelitis ([place])   . Pneumonia   . RA (rheumatoid arthritis) ([place])   . Rheumatoid arthritis [facility])    Past Surgical History:  Procedure Laterality Date  . ENDOBRONCHIAL ULTRASOUND N/A 05/13/2018   Procedure: ENDOBRONCHIAL ULTRASOUND;  Surgeon: [name], MD;  Location: [facility] ORS;  Service: Pulmonary;  Laterality: N/A;  . PORTA CATH INSERTION N/A 05/27/2018   Procedure: PORTA CATH INSERTION;  Surgeon: Katha Cabal, MD;  Location: [facility];  Service: Cardiovascular;  Laterality: N/A;  . TOE SURGERY     Allergies  Allergen Reactions  . Ibuprofen Other (See Comments)    Is not supposed to take because of her kidneys.  . Penicillins Other (See Comments)    Has patient had a PCN reaction causing immediate rash, facial/tongue/throat swelling, SOB or lightheadedness with hypotension: Unknown Has patient had a PCN reaction causing severe rash involving mucus membranes or skin necrosis: Unknown Has patient had a PCN reaction that required hospitalization: Unknown Has patient had a PCN reaction occurring within the last 10 years: Unknown If all of the above answers are "NO", then may proceed with Cephalosporin use.   Prior to Admission medications   Medication Sig Start Date End Date Taking? Authorizing Provider  albuterol (PROVENTIL HFA;VENTOLIN HFA) 108 (90 Base) MCG/ACT inhaler Inhale 2 puffs into the lungs every 6 (six) hours as needed for wheezing or shortness of breath. 02/11/18   Demetrios Loll, MD  ALPRAZolam Duanne Moron) 0.25 MG tablet Take 1 tablet (0.25 mg total) by mouth 2 (two) times daily as needed for anxiety. 05/31/18   Tasia Catchings,  Talbert Cage, MD  amLODipine (NORVASC) 5 MG tablet Take 5 mg by mouth daily. 01/20/18   [provider]  aspirin 81 MG chewable tablet Chew 1 tablet by mouth daily.    [provider]  atorvastatin (LIPITOR) 40 MG tablet Take 40 mg by mouth daily. 01/20/18   [provider]  dexamethasone (DECADRON) 4 MG tablet Take 2 tablets (8 mg total) by mouth daily. Start the day after chemotherapy for 2 days. 05/25/18   Earlie Server, MD  ferrous sulfate 325 (65 FE) MG EC tablet Take 1 tablet (325 mg total) by mouth 2 (two) times daily with a meal. 05/31/18   Earlie Server, MD  folic acid (FOLVITE) 1 MG tablet Take 1 mg by mouth daily. 01/20/18   [provider]  guaiFENesin-dextromethorphan (ROBITUSSIN DM) 100-10 MG/5ML syrup Take 5 mLs by mouth every 4 (four) hours as needed for cough. 02/11/18   Demetrios Loll, MD  Insulin Glargine [facility]) 100 UNIT/ML SOPN Inject 38 Units into the skin every morning.  01/04/18   [provider]  lidocaine-prilocaine (EMLA) cream Apply to affected area once 05/25/18   Earlie Server, MD  lisinopril (PRINIVIL,ZESTRIL) 40 MG tablet Take 40 mg by mouth daily. 01/20/18   [provider]  LORazepam (ATIVAN) 1 MG tablet Take with you to MRI scan. 05/26/18   Earlie Server, MD  metFORMIN (GLUCOPHAGE) 1000 MG tablet Take 1,000 mg by mouth 2 (two) times daily. 01/20/18   [provider]  methotrexate (RHEUMATREX) 2.5 MG tablet Take 2.5 mg by mouth once a week. Caution:Chemotherapy. Protect from light.    [provider]  metoprolol tartrate (LOPRESSOR) 25 MG tablet Take 25 mg by mouth 2 (two) times daily. 01/20/18   [provider]  mirtazapine (REMERON) 7.5 MG tablet Take 1 tablet (7.5 mg total) by mouth at bedtime. 05/31/18   Earlie Server, MD  ondansetron (ZOFRAN) 8 MG tablet Take 1 tablet (8 mg total) by mouth 2 (two) times daily as needed for refractory nausea / vomiting. Start on day 3 after chemo. 05/25/18   Earlie Server, MD  ondansetron (ZOFRAN-ODT) 4 MG disintegrating tablet Take 4 mg by mouth every 8 (eight) hours as needed for nausea.  02/17/18   [provider]  oxyCODONE (ROXICODONE) 5 MG immediate  release tablet Take 1 tablet (5 mg total) by mouth every 8 (eight) hours as needed for severe pain. 05/25/18   Earlie Server, MD  prochlorperazine (COMPAZINE) 10 MG tablet Take 1 tablet (10 mg total) by mouth every 6 (six) hours as needed (Nausea or vomiting). 05/25/18   Earlie Server, MD  traMADol (ULTRAM) 50 MG tablet Take 1 tablet (50 mg total) by mouth every 6 (six) hours as needed. 05/27/18   Schnier, Dolores Lory, MD  triamterene-hydrochlorothiazide (DYAZIDE) 37.5-25 MG capsule Take 1 capsule by mouth daily. 01/20/18   [provider]   Social History   Socioeconomic History  . Marital status: Single    Spouse name: Not on file  . Number of children: 2  . Years of education: Not on file  . Highest education level: Not on file  Occupational History  . Not on file  Social Needs  . Financial resource strain: Not very hard  . Food insecurity:    Worry: Never true    Inability: Never true  . Transportation needs:    Medical: No    Non-medical: No  Tobacco Use  . Smoking status: Former Smoker    Packs/day: 1.00  Years: 40.00    Pack years: 40.00    Types: Cigarettes    Last attempt to quit: 11/22/2017    Years since quitting: 0.5  . Smokeless tobacco: Never Used  . Tobacco comment: quit 2 weeks ago  Substance and Sexual Activity  . Alcohol use: Never    Frequency: Never  . Drug use: Never  . Sexual activity: Not on file  Lifestyle  . Physical activity:    Days per week: 0 days    Minutes per session: Not on file  . Stress: To some extent  Relationships  . Social connections:    Talks on phone: Three times a week    Gets together: More than three times a week    Attends religious service: Not on file    Active member of club or organization: Not on file    Attends meetings of clubs or organizations: Not on file    Relationship status: Not on file  . Intimate partner violence:    Fear of current or ex partner: No    Emotionally abused: No    Physically abused: No     Forced sexual activity: No  Other Topics Concern  . Not on file  Social History Narrative   Living at home with daughter.  Ambulates with a walker at baseline.    Review of Systems  Constitutional: Negative.  Negative for appetite change, chills, fatigue and fever.  HENT: Negative.  Negative for congestion, mouth sores, nosebleeds, sinus pain and sore throat.   Eyes: Negative.   Respiratory: Negative.  Negative for cough, shortness of breath and wheezing.   Cardiovascular: Negative.  Negative for chest pain and leg swelling.  Gastrointestinal: Negative.  Negative for abdominal distention, abdominal pain, blood in stool, constipation, diarrhea, nausea and vomiting.  Endocrine: Negative.   Genitourinary: Negative.  Negative for dysuria, flank pain, frequency, hematuria and urgency.  Musculoskeletal: Negative.  Negative for arthralgias and back pain.  Skin: Negative.  Negative for pallor.  Allergic/Immunologic: Negative.   Neurological: Negative.  Negative for dizziness, weakness and headaches.  Hematological: Negative.  Negative for adenopathy.  Psychiatric/Behavioral: Negative.  Negative for confusion. The patient is not nervous/anxious.     Objective:  There were no vitals taken for this visit.  Physical Exam  Constitutional: She is oriented to person, place, and time. Vital signs are normal. She appears well-developed and well-nourished.  HENT:  Head: Normocephalic and atraumatic.  Eyes: Pupils are equal, round, and reactive to light.  Neck: Normal range of motion.  Cardiovascular: Normal rate, regular rhythm and normal heart sounds.  No murmur heard. Pulmonary/Chest: Effort normal and breath sounds normal. She has no wheezes.  Abdominal: Soft. Normal appearance and bowel sounds are normal. She exhibits no distension. There is no tenderness.  Musculoskeletal: Normal range of motion. She exhibits no edema.  Neurological: She is alert and oriented to person, place, and time.    Skin: Skin is warm and dry. No rash noted.  Psychiatric: Judgment normal.  Nursing note and vitals reviewed.   Assessment & Plan:  Tamara Parrish is a 62 y.o. female who presents for chemo care prior to initiation of carbotaxol for recently diagnosed stage IV lung cancer.   Stage IV lung cancer: Beginning chemotherapy today with carbo/Taxol.  Had port placed 05/27/18 by Dr. Delana Meyer.  Tolerated well.  Attended chemo education.   She has several comorbid conditions and takes many medications unrelated to her cancer diagnosis that we reviewed today: They include:  1.  Hypertension: Managed by PCP on Norvasc 5 mg daily, lisinopril 40 mg daily, Lopressor 25 mg and Dyazide 37.5-25 mg daily.  Blood pressure elevated 154/80 today.  States it is because she is anxious to begin chemotherapy.  2.  Allergic rhinitis: Managed on OTC Claritin.   3.  Type 2 diabetes: Managed by PCP.  On basaglar 1 time each usual children but no insulin with 38 units every morning, metformin 1000 mg twice daily.   4.   RA: Managed on methotrexate sodium 2.5 mg once a week.   5.   Hyperlipidemia: Managed on Lipitor by PCP.  6.  Anxiety: Managed by Dr. Tasia Catchings for now.  On 0.25 mg Xanax twice daily.  7.  Shortness of breath/COPD: Continue albuterol inhaler as needed for wheezing and shortness of breath.   She has health insurance with [employer]. She has a primary care provider Dr. Candiss Norse.  Patient has several family members that are willing to help with transportation.  Her daughter is with her today. She denies any financial burden at this time.  She was introduced to the food pantry if needed.  We discussed her new medications that were prescribed recently for chemotherapy.  She has picked them up and has been instructed on how and when to use.  These include dexamethasone 4 mg tablets beginning the day after chemotherapy for 2 days, EMLA cream for port access Zofran ODT and tablets for nausea, Compazine 10  mg every 6 hours for nausea.   My card was provided and she was instructed to call if she needed clarification on anything over like to be seen in symptom management.  Greater than 50% was spent in counseling and coordination of care with this patient including but not limited to discussion of the relevant topics above (See A&P) including, but not limited to diagnosis and management of acute and chronic medical conditions.   Faythe Casa, NP 05/31/2018 11:21 AM'

## 2018-05-31 NOTE — Research (Signed)
I met with patient today at her medical oncology appointment to learn if she maybe interested in participating in research study-Procurement of Human Biospecimens for the Discovery and Validation of Biomarkers for the Prediction,Diagnosis and Management of Disease sponsored by [employer] and co-Sponsored by [employer]. Patient was accompanied by her daughter.I informed her that the study is asking her to give two vials of whole blood for submission to [facility] for research purposes. The patient meets eligibility for the study. I reviewed the [place] research study with the patient Tamara Parrish is not interested in participating. I thanked patient for her consideration of the study. Time spent with patient was 15 minutes.  Tamara Parrish Oncology Research Assistant 05/31/2018 9:41 AM

## 2018-05-31 NOTE — Progress Notes (Signed)
Hematology/Oncology follow up note [name] Telephone:([phone] Fax:([phone]) [phone]   Patient Care Team: Glendon Axe, MD as PCP - General (Internal Medicine) Telford Nab, RN as Registered Nurse  REFERRING PROVIDER: Dr. Felicie Morn REASON FOR VISIT Follow up for treatment of  lung cancer  HISTORY OF PRESENTING ILLNESS:  Tamara Parrish is a  62 y.o.  female with PMH listed below who was referred to me for evaluation of newly diagnosed lung cancer. Patient was admitted at the end of June and beginning of July for 2 times due to recurrent community-acquired pneumonia despite treating with antibiotics.  CT skin showed a left upper lobe consolidation suspicious malignancy enlarged bilateral hilar and mediastinal lymph nodes.  Patient was referred to see pulmonology Dr. Felicie Morn.   CT chest7/08/2017, in comparison with recent chest x-rays>>imaging personally reviewed, there are fibrotic changes of the lungs bibasilarly, greatest in the left, these are also seen in the lingula peripherally, right apex. There is masslike consolidation in the apical posterior segment of the left upper lobe. There is significant lymphadenopathy seen in the right hilar, subcarinal and right paratracheal area. There is a pulmonary arterial enlargement suggestive of pulmonary hypertension. Opacification of the left upper lobe appears advanced in comparison to previous chest x-ray 2 weeks prior 05/05/2018 CT chest,  with interval increase in size of large area of masslike consolidation within the left upper lobe which now appears to involve the adjacent chest wall as well as the left third and fourth ribs.  Mediastinal and hilar adenopathy, Patient underwent E bus bronchoscopy with E bus guided biopsy. Left upper lobe lavage was atypical cells consistent with squamous cell carcinoma. Left upper lobe brushing is positive for squamous cell carcinoma Subcarinal lymph node biopsy  negative for malignancy Right hilar lymph node biopsy negative for malignancy. Left upper lobe transbronchial biopsy positive for squamous cell carcinoma. Patient was referred to cancer center to establish cancer care with me. Today patient was accompanied by multiple family members including daughters and partner.  Denies any headache, shortness of breath, chest pain, abdominal pain, leg swelling.fever or chill.  Has history of rheumatoid arthritis follows up with Dr. Meda Coffee at [facility] Patient is on methotrexate  weekly, and  Remicade treatment. Reports persistent left posterior chest wall pain, pleuritic, i worsened with deep breath. She lives with daughters  INTERVAL HISTORY Tamara Parrish is a 62 y.o. female who has above history reviewed by me today presents for assessment prior to chemotherapy treatment for stage IV non-small cell lung cancer. Problems and complaints are listed below: #Due to the interval, patient has had Mediport placed.  She has also gone to chemotherapy education class. #Reports that she feels very anxious.  Daughter reports that patient has been very anxious at baseline.  Cancer diagnosis exacerbate patient's anxiety.  Patient reports not able to sleep well at night.  During the day she also feels "shaking".   #Left posterior chest wall pain, improved after taking pain medication.  She was given oxycodone 5 mg prescription at the last visit.  After she received Mediport placement, Dr.Shiner gave patient a prescription of tramadol.  Patient reports that she is not taking oxycodone.  Takes tramadol as instructed.  Review of Systems  Constitutional: Negative for chills, fever, malaise/fatigue and weight loss.  HENT: Negative for nosebleeds and sore throat.   Eyes: Negative for double vision, photophobia and redness.  Respiratory: Negative for cough, shortness of breath and wheezing.        Left  posterior chest wall pain.    Cardiovascular: Negative for  chest pain, palpitations and orthopnea.  Gastrointestinal: Negative for abdominal pain, blood in stool, nausea and vomiting.  Genitourinary: Negative for dysuria.  Musculoskeletal: Negative for back pain, myalgias and neck pain.  Skin: Negative for itching and rash.  Neurological: Negative for dizziness, tingling and tremors.  Endo/Heme/Allergies: Negative for environmental allergies. Does not bruise/bleed easily.  Psychiatric/Behavioral: Negative for depression. The patient is nervous/anxious and has insomnia.     MEDICAL HISTORY:  Past Medical History:  Diagnosis Date  . DM2 (diabetes mellitus, type 2) ([place])   . Dyspnea   . Hyperlipemia   . Hypertension   . Osteomyelitis ([place])   . Pneumonia   . RA (rheumatoid arthritis) ([place])   . Rheumatoid arthritis ([place])     SURGICAL HISTORY: Past Surgical History:  Procedure Laterality Date  . ENDOBRONCHIAL ULTRASOUND N/A 05/13/2018   Procedure: ENDOBRONCHIAL ULTRASOUND;  Surgeon: [name], MD;  Location: [facility] ORS;  Service: Pulmonary;  Laterality: N/A;  . PORTA CATH INSERTION N/A 05/27/2018   Procedure: PORTA CATH INSERTION;  Surgeon: Katha Cabal, MD;  Location: [place] [facility];  Service: Cardiovascular;  Laterality: N/A;  . TOE SURGERY      SOCIAL HISTORY: Social History   Socioeconomic History  . Marital status: Single    Spouse name: Not on file  . Number of children: 2  . Years of education: Not on file  . Highest education level: Not on file  Occupational History  . Not on file  Social Needs  . Financial resource strain: Not very hard  . Food insecurity:    Worry: Never true    Inability: Never true  . Transportation needs:    Medical: No    Non-medical: No  Tobacco Use  . Smoking status: Former Smoker    Packs/day: 1.00    Years: 40.00    Pack years: 40.00    Types: Cigarettes    Last attempt to quit: 11/22/2017    Years since quitting: 0.5  . Smokeless tobacco: Never Used  . Tobacco  comment: quit 2 weeks ago  Substance and Sexual Activity  . Alcohol use: Never    Frequency: Never  . Drug use: Never  . Sexual activity: Not on file  Lifestyle  . Physical activity:    Days per week: 0 days    Minutes per session: Not on file  . Stress: To some extent  Relationships  . Social connections:    Talks on phone: Three times a week    Gets together: More than three times a week    Attends religious service: Not on file    Active member of club or organization: Not on file    Attends meetings of clubs or organizations: Not on file    Relationship status: Not on file  . Intimate partner violence:    Fear of current or ex partner: No    Emotionally abused: No    Physically abused: No    Forced sexual activity: No  Other Topics Concern  . Not on file  Social History Narrative   Living at home with daughter.  Ambulates with a walker at baseline.    FAMILY HISTORY: Family History  Problem Relation Age of Onset  . Diabetes Mother   . Lung cancer Father     ALLERGIES:  is allergic to ibuprofen and penicillins.  MEDICATIONS:  Current Outpatient Medications  Medication Sig Dispense Refill  . albuterol (  PROVENTIL HFA;VENTOLIN HFA) 108 (90 Base) MCG/ACT inhaler Inhale 2 puffs into the lungs every 6 (six) hours as needed for wheezing or shortness of breath. 1 Inhaler 2  . amLODipine (NORVASC) 5 MG tablet Take 5 mg by mouth daily.  3  . aspirin 81 MG chewable tablet Chew 1 tablet by mouth daily.    Marland Kitchen atorvastatin (LIPITOR) 40 MG tablet Take 40 mg by mouth daily.  3  . dexamethasone (DECADRON) 4 MG tablet Take 2 tablets (8 mg total) by mouth daily. Start the day after chemotherapy for 2 days. 30 tablet 1  . folic acid (FOLVITE) 1 MG tablet Take 1 mg by mouth daily.  11  . guaiFENesin-dextromethorphan (ROBITUSSIN DM) 100-10 MG/5ML syrup Take 5 mLs by mouth every 4 (four) hours as needed for cough. 118 mL 0  . Insulin Glargine (BASAGLAR KWIKPEN) 100 UNIT/ML SOPN Inject 38  Units into the skin every morning.   12  . lidocaine-prilocaine (EMLA) cream Apply to affected area once 30 g 3  . lisinopril (PRINIVIL,ZESTRIL) 40 MG tablet Take 40 mg by mouth daily.  3  . LORazepam (ATIVAN) 1 MG tablet Take with you to MRI scan. 1 tablet 0  . metFORMIN (GLUCOPHAGE) 1000 MG tablet Take 1,000 mg by mouth 2 (two) times daily.  5  . methotrexate (RHEUMATREX) 2.5 MG tablet Take 2.5 mg by mouth once a week. Caution:Chemotherapy. Protect from light.    . metoprolol tartrate (LOPRESSOR) 25 MG tablet Take 25 mg by mouth 2 (two) times daily.  3  . ondansetron (ZOFRAN) 8 MG tablet Take 1 tablet (8 mg total) by mouth 2 (two) times daily as needed for refractory nausea / vomiting. Start on day 3 after chemo. 30 tablet 1  . ondansetron (ZOFRAN-ODT) 4 MG disintegrating tablet Take 4 mg by mouth every 8 (eight) hours as needed for nausea.     Marland Kitchen oxyCODONE (ROXICODONE) 5 MG immediate release tablet Take 1 tablet (5 mg total) by mouth every 8 (eight) hours as needed for severe pain. 30 tablet 0  . prochlorperazine (COMPAZINE) 10 MG tablet Take 1 tablet (10 mg total) by mouth every 6 (six) hours as needed (Nausea or vomiting). 30 tablet 1  . traMADol (ULTRAM) 50 MG tablet Take 1 tablet (50 mg total) by mouth every 6 (six) hours as needed. 30 tablet 0  . triamterene-hydrochlorothiazide (DYAZIDE) 37.5-25 MG capsule Take 1 capsule by mouth daily.  3  . ALPRAZolam (XANAX) 0.25 MG tablet Take 1 tablet (0.25 mg total) by mouth 2 (two) times daily as needed for anxiety. 30 tablet 0  . ferrous sulfate 325 (65 FE) MG EC tablet Take 1 tablet (325 mg total) by mouth 2 (two) times daily with a meal. 60 tablet 3  . mirtazapine (REMERON) 7.5 MG tablet Take 1 tablet (7.5 mg total) by mouth at bedtime. 30 tablet 0   No current facility-administered medications for this visit.      PHYSICAL EXAMINATION: ECOG PERFORMANCE STATUS: 1 - Symptomatic but completely ambulatory Vitals:   05/31/18 0843  BP: (!) 154/80   Pulse: 70  Resp: 18  Temp: (!) 96.3 F (35.7 C)  SpO2: 98%   Filed Weights   05/31/18 0843  Weight: 183 lb 6.4 oz (83.2 kg)    Physical Exam  Constitutional: She is oriented to person, place, and time. No distress.  HENT:  Head: Normocephalic and atraumatic.  Mouth/Throat: Oropharynx is clear and moist.  Eyes: Pupils are equal, round, and reactive to light.  EOM are normal. No scleral icterus.  Neck: Normal range of motion. Neck supple.  Cardiovascular: Normal rate, regular rhythm and normal heart sounds.  Pulmonary/Chest: Effort normal. No respiratory distress. She has no wheezes. She exhibits tenderness.  Decreased breath sounds bilaterally  Abdominal: Soft. Bowel sounds are normal. She exhibits no distension and no mass. There is no tenderness.  Musculoskeletal: Normal range of motion. She exhibits no edema or deformity.  Neurological: She is alert and oriented to person, place, and time. No cranial nerve deficit. Coordination normal.  Skin: Skin is warm and dry. No rash noted. No erythema.  Psychiatric: She has a normal mood and affect. Her behavior is normal. Thought content normal.     LABORATORY DATA:  I have reviewed the data as listed Lab Results  Component Value Date   WBC 13.8 (H) 05/31/2018   HGB 9.5 (L) 05/31/2018   HCT 28.6 (L) 05/31/2018   MCV 87.8 05/31/2018   PLT 573 (H) 05/31/2018   Recent Labs    02/21/18 1835 02/22/18 0459 05/25/18 1207 05/31/18 0813  NA 135 137 134* 135  K 4.6 4.5 3.7 4.2  CL 101 104 100 101  CO2 25 27 24 25   GLUCOSE 198* 63* 269* 198*  BUN 23 22 19 15   CREATININE 1.06* 0.72 0.92 0.82  CALCIUM 9.1 9.0 9.0 9.6  GFRNONAA 55* >60 >60 >60  GFRAA >60 >60 >60 >60  PROT 8.3*  --  8.0 7.6  ALBUMIN 2.5*  --  2.9* 2.8*  AST 20  --  16 18  ALT 6  --  9 6  ALKPHOS 83  --  93 94  BILITOT 0.6  --  0.4 0.4   Iron/TIBC/Ferritin/ %Sat    Component Value Date/Time   IRON 27 (L) 05/25/2018 1207   TIBC 241 (L) 05/25/2018 1207    FERRITIN 181 05/25/2018 1207   IRONPCTSAT 11 05/25/2018 1207     RADIOGRAPHIC STUDIES: I have personally reviewed the radiological images as listed and agreed with the findings in the report. 05/24/2018 PET scan  1. Highly hypermetabolic left upper lobe mass, maximum SUV 16.9, with chest wall invasion of the left third and fourth ribs and into the third intercostal space. 2. Separate pleural tumor deposit on the left at the T3-4 level medially near the neural foramen. 3. Hypermetabolic AP window lymph nodes. Probable left hilar lymph nodes confluent with the mass. Separate tumor nodules are noted in the left upper lobe. 4. Hypermetabolic sacrococcygeal junction without a well-defined lesion, probably from a fracture, less likely from early metastatic disease.   ASSESSMENT & PLAN:  1. Malignant neoplasm of lung, unspecified laterality, unspecified part of lung ([place])   2. Rheumatoid arthritis, involving unspecified site, unspecified rheumatoid factor presence ([place])   3. Goals of care, counseling/discussion   4. Thrombocytosis ([place])   5. Anemia, unspecified type   6. Encounter for antineoplastic chemotherapy   7. Anxiety   8. 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  [id number] disease at least, if sacrococcygeal junction turn out to be involved, then she has M1b disease.  Labs are reviewed and discussed with patient.  Counts are acceptable to proceed with cycle 1 carboplatin [AUC 6] and Taxol [175mg /m2] treatment. Discussed details of antiemetics instructions. Day 3 Udenyca.   # Will not offer immunotherapy given her rheumatoid arthritis.  I called Dr.Bock and discussed with her. Dr.Bock agrees with holding weekly Methotrexate and Remicade {last does in May].  We will monitor her symptoms if she flares, will ask Dr.Bock to start her on Plaquenil.   #MRI of brain can not be finished due to anxiety despite using ativan.  Will schedule patient to have open MRI  brain.   #Biopsy sample was not enough for ancillary testing.  We have sent foundation liquid biopsy. Results pending.   # Local chest wall invasion/neoplasm pain: continue Tramadol as instructed for now.  Refer to Rad Onc for palliative RT.   # Anemia, of chronic inflammation +/- low iron store with decreased iron saturation. Hold Methotrexate. Start trial of oral ferrous sulfate 325mg  BID. Use OTC Colace if constipated.   # Thrombocytosis,likely reactive. Continue monitoring.  # Anxiety/insomnia : will start patient on Mirtazapine 10mg  QHS, and also Xanax 0.25mg  BID as needed.  We spent sufficient time to discuss many aspect of care, questions were answered to patient's satisfaction.  Return of visit: 1 week Total face to face encounter time for this patient visit was  40 min. >50% of the time was  spent in counseling and coordination of care.   Earlie Server, MD, PhD Hematology Oncology [facility] at [facility] Pager- [phone] 05/31/2018

## 2018-05-31 NOTE — Progress Notes (Signed)
  Oncology Nurse Navigator Documentation  Navigator Location: [facility]-Med Onc (05/31/18 1000)   )Navigator Encounter Type: Treatment (05/31/18 1000)                   Treatment Initiated Date: 05/31/18 (05/31/18 1000) Patient Visit Type: MedOnc (05/31/18 1000) Treatment Phase: First Chemo Tx (05/31/18 1000) Barriers/Navigation Needs: Coordination of Care (05/31/18 1000)   Interventions: Coordination of Care (05/31/18 1000)   Coordination of Care: Appts;Radiology (05/31/18 1000)       met with patient and daughter prior to receiving first chemo treatment. Pt states that tried to follow through with brain MRI over the weekend but was still too anxious even after taking anti-anxiety medication. Pt requests open mri to be scheduled. Order has been placed per Dr. Tasia Catchings and pt will be scheduled and notified with appt. All questions answered at the time of visit. Reviewed upcoming appts with pt and daughter. Pt and daughter verbalized understanding. Nothing further needed at this time.           Time Spent with Patient: 45 (05/31/18 1000)

## 2018-05-31 NOTE — Progress Notes (Signed)
Patient here for follow up

## 2018-06-02 ENCOUNTER — Inpatient Hospital Stay: Payer: BLUE CROSS/BLUE SHIELD

## 2018-06-02 DIAGNOSIS — Z5111 Encounter for antineoplastic chemotherapy: Secondary | ICD-10-CM | POA: Diagnosis not present

## 2018-06-02 DIAGNOSIS — C3490 Malignant neoplasm of unspecified part of unspecified bronchus or lung: Secondary | ICD-10-CM

## 2018-06-02 MED ORDER — PEGFILGRASTIM-CBQV 6 MG/0.6ML ~~LOC~~ SOSY
6.0000 mg | PREFILLED_SYRINGE | Freq: Once | SUBCUTANEOUS | Status: AC
  Administered 2018-06-02: 6 mg via SUBCUTANEOUS

## 2018-06-03 ENCOUNTER — Ambulatory Visit: Payer: BLUE CROSS/BLUE SHIELD | Admitting: Radiation Oncology

## 2018-06-04 LAB — CULTURE, FUNGUS WITHOUT SMEAR: SPECIAL REQUESTS: NORMAL

## 2018-06-07 ENCOUNTER — Encounter: Payer: Self-pay | Admitting: *Deleted

## 2018-06-07 ENCOUNTER — Inpatient Hospital Stay: Payer: BLUE CROSS/BLUE SHIELD

## 2018-06-07 ENCOUNTER — Ambulatory Visit
Admission: RE | Admit: 2018-06-07 | Discharge: 2018-06-07 | Disposition: A | Discharge status: Home / Self Care | Payer: BLUE CROSS/BLUE SHIELD | Source: Ambulatory Visit | Attending: Radiation Oncology | Admitting: Radiation Oncology

## 2018-06-07 ENCOUNTER — Encounter: Payer: Self-pay | Admitting: Oncology

## 2018-06-07 ENCOUNTER — Other Ambulatory Visit: Payer: Self-pay

## 2018-06-07 ENCOUNTER — Inpatient Hospital Stay (HOSPITAL_BASED_OUTPATIENT_CLINIC_OR_DEPARTMENT_OTHER): Payer: BLUE CROSS/BLUE SHIELD | Admitting: Oncology

## 2018-06-07 VITALS — BP 127/75 | HR 89 | Temp 97.5°F | Resp 18 | Wt 182.5 lb

## 2018-06-07 DIAGNOSIS — E119 Type 2 diabetes mellitus without complications: Secondary | ICD-10-CM | POA: Insufficient documentation

## 2018-06-07 DIAGNOSIS — F1120 Opioid dependence, uncomplicated: Secondary | ICD-10-CM | POA: Diagnosis not present

## 2018-06-07 DIAGNOSIS — Z794 Long term (current) use of insulin: Secondary | ICD-10-CM | POA: Diagnosis not present

## 2018-06-07 DIAGNOSIS — M069 Rheumatoid arthritis, unspecified: Secondary | ICD-10-CM | POA: Diagnosis not present

## 2018-06-07 DIAGNOSIS — Z9221 Personal history of antineoplastic chemotherapy: Secondary | ICD-10-CM | POA: Diagnosis not present

## 2018-06-07 DIAGNOSIS — F419 Anxiety disorder, unspecified: Secondary | ICD-10-CM

## 2018-06-07 DIAGNOSIS — E785 Hyperlipidemia, unspecified: Secondary | ICD-10-CM | POA: Insufficient documentation

## 2018-06-07 DIAGNOSIS — Z87891 Personal history of nicotine dependence: Secondary | ICD-10-CM | POA: Diagnosis not present

## 2018-06-07 DIAGNOSIS — Z79899 Other long term (current) drug therapy: Secondary | ICD-10-CM | POA: Insufficient documentation

## 2018-06-07 DIAGNOSIS — D473 Essential (hemorrhagic) thrombocythemia: Secondary | ICD-10-CM | POA: Diagnosis not present

## 2018-06-07 DIAGNOSIS — C3490 Malignant neoplasm of unspecified part of unspecified bronchus or lung: Secondary | ICD-10-CM

## 2018-06-07 DIAGNOSIS — C3412 Malignant neoplasm of upper lobe, left bronchus or lung: Secondary | ICD-10-CM

## 2018-06-07 DIAGNOSIS — Z23 Encounter for immunization: Secondary | ICD-10-CM

## 2018-06-07 DIAGNOSIS — Z7982 Long term (current) use of aspirin: Secondary | ICD-10-CM | POA: Diagnosis not present

## 2018-06-07 DIAGNOSIS — C7951 Secondary malignant neoplasm of bone: Secondary | ICD-10-CM

## 2018-06-07 DIAGNOSIS — D649 Anemia, unspecified: Secondary | ICD-10-CM

## 2018-06-07 DIAGNOSIS — Z5111 Encounter for antineoplastic chemotherapy: Secondary | ICD-10-CM | POA: Diagnosis not present

## 2018-06-07 DIAGNOSIS — R0602 Shortness of breath: Secondary | ICD-10-CM | POA: Diagnosis not present

## 2018-06-07 DIAGNOSIS — G893 Neoplasm related pain (acute) (chronic): Secondary | ICD-10-CM

## 2018-06-07 DIAGNOSIS — I10 Essential (primary) hypertension: Secondary | ICD-10-CM | POA: Diagnosis not present

## 2018-06-07 DIAGNOSIS — D72829 Elevated white blood cell count, unspecified: Secondary | ICD-10-CM | POA: Diagnosis not present

## 2018-06-07 DIAGNOSIS — D75839 Thrombocytosis, unspecified: Secondary | ICD-10-CM

## 2018-06-07 LAB — CBC WITH DIFFERENTIAL/PLATELET
ABS IMMATURE GRANULOCYTES: 0.3 10*3/uL — AB (ref 0.00–0.07)
Basophils Absolute: 0.1 10*3/uL (ref 0.0–0.1)
Basophils Relative: 0 %
EOS PCT: 4 %
Eosinophils Absolute: 0.6 10*3/uL — ABNORMAL HIGH (ref 0.0–0.5)
HCT: 29.9 % — ABNORMAL LOW (ref 36.0–46.0)
HEMOGLOBIN: 9.3 g/dL — AB (ref 12.0–15.0)
Immature Granulocytes: 2 %
LYMPHS ABS: 2.3 10*3/uL (ref 0.7–4.0)
LYMPHS PCT: 13 %
MCH: 27.6 pg (ref 26.0–34.0)
MCHC: 31.1 g/dL (ref 30.0–36.0)
MCV: 88.7 fL (ref 80.0–100.0)
MONO ABS: 1.9 10*3/uL — AB (ref 0.1–1.0)
Monocytes Relative: 11 %
Neutro Abs: 12.3 10*3/uL — ABNORMAL HIGH (ref 1.7–7.7)
Neutrophils Relative %: 70 %
Platelets: 391 10*3/uL (ref 150–400)
RBC: 3.37 MIL/uL — ABNORMAL LOW (ref 3.87–5.11)
RDW: 14.6 % (ref 11.5–15.5)
WBC: 17.4 10*3/uL — ABNORMAL HIGH (ref 4.0–10.5)
nRBC: 0 % (ref 0.0–0.2)

## 2018-06-07 LAB — COMPREHENSIVE METABOLIC PANEL
ALT: 8 U/L (ref 0–44)
AST: 17 U/L (ref 15–41)
Albumin: 2.7 g/dL — ABNORMAL LOW (ref 3.5–5.0)
Alkaline Phosphatase: 104 U/L (ref 38–126)
Anion gap: 8 (ref 5–15)
BUN: 24 mg/dL — AB (ref 8–23)
CO2: 22 mmol/L (ref 22–32)
CREATININE: 0.87 mg/dL (ref 0.44–1.00)
Calcium: 8.8 mg/dL — ABNORMAL LOW (ref 8.9–10.3)
Chloride: 109 mmol/L (ref 98–111)
GFR calc Af Amer: >60 mL/min (ref >60)
Glucose, Bld: 143 mg/dL — ABNORMAL HIGH (ref 70–99)
Potassium: 4.2 mmol/L (ref 3.5–5.1)
SODIUM: 139 mmol/L (ref 135–145)
Total Bilirubin: 0.4 mg/dL (ref 0.3–1.2)
Total Protein: 6.8 g/dL (ref 6.5–8.1)

## 2018-06-07 MED ORDER — INFLUENZA VAC SPLIT QUAD 0.5 ML IM SUSY
0.5000 mL | PREFILLED_SYRINGE | Freq: Once | INTRAMUSCULAR | Status: AC
  Administered 2018-06-07: 0.5 mL via INTRAMUSCULAR
  Filled 2018-06-07: qty 0.5

## 2018-06-07 NOTE — Progress Notes (Signed)
Hematology/Oncology follow up note [facility] [phone] [phone]   Patient Care Team: Glendon Axe, MD as PCP - General (Internal Medicine) Telford Nab, RN as Registered Nurse  REFERRING PROVIDER: Dr. Felicie Morn REASON FOR VISIT Follow up for treatment of  lung cancer  HISTORY OF PRESENTING ILLNESS:  Tamara Parrish is a  62 y.o.  female with PMH listed below who was referred to me for evaluation of newly diagnosed lung cancer. Patient was admitted at the end of June and beginning of July for 2 times due to recurrent community-acquired pneumonia despite treating with antibiotics.  CT skin showed a left upper lobe consolidation suspicious malignancy enlarged bilateral hilar and mediastinal lymph nodes.  Patient was referred to see pulmonology Dr. Felicie Morn.   CT chest7/08/2017, in comparison with recent chest x-rays>>imaging personally reviewed, there are fibrotic changes of the lungs bibasilarly, greatest in the left, these are also seen in the lingula peripherally, right apex. There is masslike consolidation in the apical posterior segment of the left upper lobe. There is significant lymphadenopathy seen in the right hilar, subcarinal and right paratracheal area. There is a pulmonary arterial enlargement suggestive of pulmonary hypertension. Opacification of the left upper lobe appears advanced in comparison to previous chest x-ray 2 weeks prior 05/05/2018 CT chest,  with interval increase in size of large area of masslike consolidation within the left upper lobe which now appears to involve the adjacent chest wall as well as the left third and fourth ribs.  Mediastinal and hilar adenopathy, Patient underwent E bus bronchoscopy with E bus guided biopsy. Left upper lobe lavage was atypical cells consistent with squamous cell carcinoma. Left upper lobe brushing is positive for squamous cell carcinoma Subcarinal lymph node biopsy  negative for malignancy Right hilar lymph node biopsy negative for malignancy. Left upper lobe transbronchial biopsy positive for squamous cell carcinoma. Patient was referred to cancer center to establish cancer care with me. Today patient was accompanied by multiple family members including daughters and partner.  Denies any headache, shortness of breath, chest pain, abdominal pain, leg swelling.fever or chill.  Has history of rheumatoid arthritis follows up with Dr. Meda Coffee at [facility] clinic Patient is on methotrexate  weekly, and  Remicade treatment. Reports persistent left posterior chest wall pain, pleuritic, i worsened with deep breath. She lives with daughters  INTERVAL HISTORY Tamara Parrish is a 62 y.o. female who has above history reviewed by me today presents for assessment for tolerability after cycle 1 chemotherapy treatment for stage IV non-small cell lung cancer.  #Chemotherapy, reports feeling well.  Denies any nausea vomiting.  Feels tired. # Fatigue: reports worsening fatigue. Chronic onset, perisistent, fatigue worsened after getting chemo.  No associated symptoms.  #Left posterior chest wall pain, improved after taking pain medication.  No pain today. #Rheumatoid arthritis, I have discussed with patient's rheumatologist Dr. Meda Coffee who agrees the patient to come off methotrexate.  She was started on Plaquenil. Reports arthritic pain which is managed by taking pain medication as well. #Anxiety, less anxious since started on Remeron 7.5 mg nightly.   Review of Systems  Constitutional: Negative for chills, fever, malaise/fatigue and weight loss.  HENT: Negative for nosebleeds and sore throat.   Eyes: Negative for double vision, photophobia and redness.  Respiratory: Negative for cough, shortness of breath and wheezing.           Cardiovascular: Negative for chest pain, palpitations and orthopnea.  Gastrointestinal: Negative for abdominal pain, blood in stool, nausea  and vomiting.  Genitourinary: Negative for dysuria.  Musculoskeletal: Negative for back pain, myalgias and neck pain.  Skin: Negative for itching and rash.  Neurological: Negative for dizziness, tingling and tremors.  Endo/Heme/Allergies: Negative for environmental allergies. Does not bruise/bleed easily.  Psychiatric/Behavioral: Negative for depression. The patient is not nervous/anxious and does not have insomnia.     MEDICAL HISTORY:  Past Medical History:  Diagnosis Date  . DM2 (diabetes mellitus, type 2) ([place])   . Dyspnea   . Hyperlipemia   . Hypertension   . Osteomyelitis ([place])   . Pneumonia   . RA (rheumatoid arthritis) ([place])   . Rheumatoid arthritis ([place])     SURGICAL HISTORY: Past Surgical History:  Procedure Laterality Date  . ENDOBRONCHIAL ULTRASOUND N/A 05/13/2018   Procedure: ENDOBRONCHIAL ULTRASOUND;  Surgeon: [name], MD;  Location: [facility] ORS;  Service: Pulmonary;  Laterality: N/A;  . PORTA CATH INSERTION N/A 05/27/2018   Procedure: PORTA CATH INSERTION;  Surgeon: Katha Cabal, MD;  Location: [facility];  Service: Cardiovascular;  Laterality: N/A;  . TOE SURGERY      SOCIAL HISTORY: Social History   Socioeconomic History  . Marital status: Single    Spouse name: Not on file  . Number of children: 2  . Years of education: Not on file  . Highest education level: Not on file  Occupational History  . Not on file  Social Needs  . Financial resource strain: Not very hard  . Food insecurity:    Worry: Never true    Inability: Never true  . Transportation needs:    Medical: No    Non-medical: No  Tobacco Use  . Smoking status: Former Smoker    Packs/day: 1.00    Years: 40.00    Pack years: 40.00    Types: Cigarettes    Last attempt to quit: 11/22/2017    Years since quitting: 0.5  . Smokeless tobacco: Never Used  . Tobacco comment: quit 2 weeks ago  Substance and Sexual Activity  . Alcohol use: Never    Frequency: Never    . Drug use: Never  . Sexual activity: Not on file  Lifestyle  . Physical activity:    Days per week: 0 days    Minutes per session: Not on file  . Stress: To some extent  Relationships  . Social connections:    Talks on phone: Three times a week    Gets together: More than three times a week    Attends religious service: Not on file    Active member of club or organization: Not on file    Attends meetings of clubs or organizations: Not on file    Relationship status: Not on file  . Intimate partner violence:    Fear of current or ex partner: No    Emotionally abused: No    Physically abused: No    Forced sexual activity: No  Other Topics Concern  . Not on file  Social History Narrative   Living at home with daughter.  Ambulates with a walker at baseline.    FAMILY HISTORY: Family History  Problem Relation Age of Onset  . Diabetes Mother   . Lung cancer Father     ALLERGIES:  is allergic to ibuprofen and penicillins.  MEDICATIONS:  Current Outpatient Medications  Medication Sig Dispense Refill  . albuterol (PROVENTIL HFA;VENTOLIN HFA) 108 (90 Base) MCG/ACT inhaler Inhale 2 puffs into the lungs every 6 (six) hours as needed for wheezing  or shortness of breath. 1 Inhaler 2  . ALPRAZolam (XANAX) 0.25 MG tablet Take 1 tablet (0.25 mg total) by mouth 2 (two) times daily as needed for anxiety. 30 tablet 0  . amLODipine (NORVASC) 5 MG tablet Take 5 mg by mouth daily.  3  . aspirin 81 MG chewable tablet Chew 1 tablet by mouth daily.    Marland Kitchen atorvastatin (LIPITOR) 40 MG tablet Take 40 mg by mouth daily.  3  . dexamethasone (DECADRON) 4 MG tablet Take 2 tablets (8 mg total) by mouth daily. Start the day after chemotherapy for 2 days. 30 tablet 1  . ferrous sulfate 325 (65 FE) MG EC tablet Take 1 tablet (325 mg total) by mouth 2 (two) times daily with a meal. 60 tablet 3  . folic acid (FOLVITE) 1 MG tablet Take 1 mg by mouth daily.  11  . guaiFENesin-dextromethorphan (ROBITUSSIN DM)  100-10 MG/5ML syrup Take 5 mLs by mouth every 4 (four) hours as needed for cough. 118 mL 0  . Insulin Glargine (BASAGLAR KWIKPEN) 100 UNIT/ML SOPN Inject 38 Units into the skin every morning.   12  . lidocaine-prilocaine (EMLA) cream Apply to affected area once 30 g 3  . lisinopril (PRINIVIL,ZESTRIL) 40 MG tablet Take 40 mg by mouth daily.  3  . LORazepam (ATIVAN) 1 MG tablet Take with you to MRI scan. 1 tablet 0  . metFORMIN (GLUCOPHAGE) 1000 MG tablet Take 1,000 mg by mouth 2 (two) times daily.  5  . methotrexate (RHEUMATREX) 2.5 MG tablet Take 2.5 mg by mouth once a week. Caution:Chemotherapy. Protect from light.    . metoprolol tartrate (LOPRESSOR) 25 MG tablet Take 25 mg by mouth 2 (two) times daily.  3  . mirtazapine (REMERON) 7.5 MG tablet Take 1 tablet (7.5 mg total) by mouth at bedtime. 30 tablet 0  . ondansetron (ZOFRAN) 8 MG tablet Take 1 tablet (8 mg total) by mouth 2 (two) times daily as needed for refractory nausea / vomiting. Start on day 3 after chemo. 30 tablet 1  . ondansetron (ZOFRAN-ODT) 4 MG disintegrating tablet Take 4 mg by mouth every 8 (eight) hours as needed for nausea.     . prochlorperazine (COMPAZINE) 10 MG tablet Take 1 tablet (10 mg total) by mouth every 6 (six) hours as needed (Nausea or vomiting). 30 tablet 1  . traMADol (ULTRAM) 50 MG tablet Take 1 tablet (50 mg total) by mouth every 6 (six) hours as needed. 30 tablet 0  . triamterene-hydrochlorothiazide (DYAZIDE) 37.5-25 MG capsule Take 1 capsule by mouth daily.  3   No current facility-administered medications for this visit.      PHYSICAL EXAMINATION: ECOG PERFORMANCE STATUS: 1 - Symptomatic but completely ambulatory Vitals:   06/07/18 0950  BP: 127/75  Pulse: 89  Resp: 18  Temp: (!) 97.5 F (36.4 C)  SpO2: 97%   Filed Weights   06/07/18 0950  Weight: 182 lb 8 oz (82.8 kg)    Physical Exam  Constitutional: She is oriented to person, place, and time. No distress.  HENT:  Head: Normocephalic  and atraumatic.  Mouth/Throat: Oropharynx is clear and moist.  Eyes: Pupils are equal, round, and reactive to light. EOM are normal. No scleral icterus.  Neck: Normal range of motion. Neck supple.  Cardiovascular: Normal rate, regular rhythm and normal heart sounds.  Pulmonary/Chest: Effort normal. No respiratory distress. She has no wheezes. She exhibits no tenderness.  Decreased breath sounds bilaterally  Abdominal: Soft. Bowel sounds are normal. She  exhibits no distension and no mass. There is no tenderness.  Musculoskeletal: Normal range of motion. She exhibits no edema or deformity.  Neurological: She is alert and oriented to person, place, and time. No cranial nerve deficit. Coordination normal.  Skin: Skin is warm and dry. No rash noted. No erythema.  Psychiatric: She has a normal mood and affect. Her behavior is normal. Thought content normal.     LABORATORY DATA:  I have reviewed the data as listed Lab Results  Component Value Date   WBC 13.8 (H) 05/31/2018   HGB 9.5 (L) 05/31/2018   HCT 28.6 (L) 05/31/2018   MCV 87.8 05/31/2018   PLT 573 (H) 05/31/2018   Recent Labs    02/21/18 1835 02/22/18 0459 05/25/18 1207 05/31/18 0813  NA 135 137 134* 135  K 4.6 4.5 3.7 4.2  CL 101 104 100 101  CO2 25 27 24 25   GLUCOSE 198* 63* 269* 198*  BUN 23 22 19 15   CREATININE 1.06* 0.72 0.92 0.82  CALCIUM 9.1 9.0 9.0 9.6  GFRNONAA 55* >60 >60 >60  GFRAA >60 >60 >60 >60  PROT 8.3*  --  8.0 7.6  ALBUMIN 2.5*  --  2.9* 2.8*  AST 20  --  16 18  ALT 6  --  9 6  ALKPHOS 83  --  93 94  BILITOT 0.6  --  0.4 0.4   Iron/TIBC/Ferritin/ %Sat    Component Value Date/Time   IRON 27 (L) 05/25/2018 1207   TIBC 241 (L) 05/25/2018 1207   FERRITIN 181 05/25/2018 1207   IRONPCTSAT 11 05/25/2018 1207    RADIOGRAPHIC STUDIES: I have personally reviewed the radiological images as listed and agreed with the findings in the report. 05/24/2018 PET scan  1. Highly hypermetabolic left upper lobe  mass, maximum SUV 16.9, with chest wall invasion of the left third and fourth ribs and into the third intercostal space. 2. Separate pleural tumor deposit on the left at the T3-4 level medially near the neural foramen. 3. Hypermetabolic AP window lymph nodes. Probable left hilar lymph nodes confluent with the mass. Separate tumor nodules are noted in the left upper lobe. 4. Hypermetabolic sacrococcygeal junction without a well-defined lesion, probably from a fracture, less likely from early metastatic disease.   ASSESSMENT & PLAN:  1. Malignant neoplasm of lung, unspecified laterality, unspecified part of lung ([place])   2. Rheumatoid arthritis, involving unspecified site, unspecified rheumatoid factor presence ([place])   3. Thrombocytosis ([place])   4. Anemia, unspecified type   5. 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  [id number] disease at least, if sacrococcygeal junction turn out to be involved, then she has M1b disease.  Labs are reviewed and discussed with patient.  Counts acceptable. Leukocytosis secondary to G-CSF.  #Awaiting MRI of brain to be done. #Anxiety, continue Remeron nightly and Xanax as needed #RA, off methotrexate and Remicade.  Continue Plaquenil. #Local chest wall invasion/neoplasm pain, continue tramadol as instructed for now.  She will see [facility] for palliative radiation. #Anemia, multifactorial.  Chronic inflammation plus minus low iron store was decreased iron saturation. Hold methotrexate.  Continue ferrous sulfate 325 mg twice daily and OTC Colace if constipated.  Tolerates well. We spent sufficient time to discuss many aspect of care, questions were answered to patient's satisfaction.  Return of visit: 1 week Total face to face encounter time for this patient visit was 25 min. >50% of the time was  spent in counseling and  coordination of care.   Earlie Server, MD, PhD Hematology Oncology [facility] at [facility] Pager-  [phone] 06/07/2018

## 2018-06-07 NOTE — Consult Note (Signed)
NEW PATIENT EVALUATION  Name: Tamara Parrish  MRN: [id number]  Date:   06/07/2018     DOB: 1956-06-06   This 62 y.o. female patient presents to the clinic for initial evaluation of for stage IV squamous cell carcinoma of the left lung stage IV based on positive cytology in pulmonary lavage.  REFERRING PHYSICIAN: Glendon Axe, MD  CHIEF COMPLAINT:  Chief Complaint  Patient presents with  . Cancer    Pt is here for initial consult left rib    DIAGNOSIS: The encounter diagnosis was Bone metastasis ([place]).   PREVIOUS INVESTIGATIONS:  PET CT scan and CT scans reviewed Cytology pathology reports reviewed Clinical notes reviewed  HPI: patient is a 62 year old female who had 2 hospitalizations in June and July of this year for recurrent community-acquired pneumonia treated with antibiotic therapy. Initially CT scan showed left upper lobe consolidation which was suspicious for malignancy. She also had below enlarged bilateral hilar and mediastinal adenopathy.PET CT scan show to him certain hypermetabolic activity in the mass with involvement of the left chest wall. Also left third and fourth ribs were involved causing significant narcotic dependent pain. Endobronchial biopsy by EBUSwas positive for non-small cell lung cancers consistent with squamous cell carcinoma. Pulmonary lavage cytology also showed cells consistent with squamous cell carcinoma. PET CT scan also demonstrated separate Pohlman pleural tumor deposits near the neural foramina at the level of T3-4.She's been seen by medical oncology port has been placed and she has been started on systemic chemotherapypatient does have rheumatoid arthritis so immunotherapy will not be entertained.she has been started on carboplatinum and Taxol.  PLANNED TREATMENT REGIMEN: palliative radiation therapy to chest  PAST MEDICAL HISTORY:  has a past medical history of DM2 (diabetes mellitus, type 2) ([place]), Dyspnea, Hyperlipemia, Hypertension,  Osteomyelitis ([place]), Pneumonia, RA (rheumatoid arthritis) ([place]), and Rheumatoid arthritis ([place]).    PAST SURGICAL HISTORY:  Past Surgical History:  Procedure Laterality Date  . ENDOBRONCHIAL ULTRASOUND N/A 05/13/2018   Procedure: ENDOBRONCHIAL ULTRASOUND;  Surgeon: [name], MD;  Location: [facility] ORS;  Service: Pulmonary;  Laterality: N/A;  . PORTA CATH INSERTION N/A 05/27/2018   Procedure: PORTA CATH INSERTION;  Surgeon: Katha Cabal, MD;  Location: [facility];  Service: Cardiovascular;  Laterality: N/A;  . TOE SURGERY      FAMILY HISTORY: family history includes Diabetes in her mother; Lung cancer in her father.  SOCIAL HISTORY:  reports that she quit smoking about 6 months ago. Her smoking use included cigarettes. She has a 40.00 pack-year smoking history. She has never used smokeless tobacco. She reports that she does not drink alcohol or use drugs.  ALLERGIES: Ibuprofen and Penicillins  MEDICATIONS:  Current Outpatient Medications  Medication Sig Dispense Refill  . albuterol (PROVENTIL HFA;VENTOLIN HFA) 108 (90 Base) MCG/ACT inhaler Inhale 2 puffs into the lungs every 6 (six) hours as needed for wheezing or shortness of breath. 1 Inhaler 2  . ALPRAZolam (XANAX) 0.25 MG tablet Take 1 tablet (0.25 mg total) by mouth 2 (two) times daily as needed for anxiety. 30 tablet 0  . amLODipine (NORVASC) 5 MG tablet Take 5 mg by mouth daily.  3  . aspirin 81 MG chewable tablet Chew 1 tablet by mouth daily.    Marland Kitchen atorvastatin (LIPITOR) 40 MG tablet Take 40 mg by mouth daily.  3  . dexamethasone (DECADRON) 4 MG tablet Take 2 tablets (8 mg total) by mouth daily. Start the day after chemotherapy for 2 days. 30 tablet 1  .  ferrous sulfate 325 (65 FE) MG EC tablet Take 1 tablet (325 mg total) by mouth 2 (two) times daily with a meal. 60 tablet 3  . folic acid (FOLVITE) 1 MG tablet Take 1 mg by mouth daily.  11  . guaiFENesin-dextromethorphan (ROBITUSSIN DM) 100-10 MG/5ML syrup  Take 5 mLs by mouth every 4 (four) hours as needed for cough. 118 mL 0  . hydroxychloroquine (PLAQUENIL) 200 MG tablet 1 tab twice a day, 90 days    . Insulin Glargine (BASAGLAR KWIKPEN) 100 UNIT/ML SOPN Inject 38 Units into the skin every morning.   12  . lidocaine-prilocaine (EMLA) cream Apply to affected area once 30 g 3  . lisinopril (PRINIVIL,ZESTRIL) 40 MG tablet Take 40 mg by mouth daily.  3  . LORazepam (ATIVAN) 1 MG tablet Take with you to MRI scan. (Patient not taking: Reported on 06/07/2018) 1 tablet 0  . metFORMIN (GLUCOPHAGE) 1000 MG tablet Take 1,000 mg by mouth 2 (two) times daily.  5  . methotrexate (RHEUMATREX) 2.5 MG tablet Take 2.5 mg by mouth once a week. Caution:Chemotherapy. Protect from light.    . metoprolol tartrate (LOPRESSOR) 25 MG tablet Take 25 mg by mouth 2 (two) times daily.  3  . mirtazapine (REMERON) 7.5 MG tablet Take 1 tablet (7.5 mg total) by mouth at bedtime. 30 tablet 0  . ondansetron (ZOFRAN) 8 MG tablet Take 1 tablet (8 mg total) by mouth 2 (two) times daily as needed for refractory nausea / vomiting. Start on day 3 after chemo. 30 tablet 1  . ondansetron (ZOFRAN-ODT) 4 MG disintegrating tablet Take 4 mg by mouth every 8 (eight) hours as needed for nausea.     . prochlorperazine (COMPAZINE) 10 MG tablet Take 1 tablet (10 mg total) by mouth every 6 (six) hours as needed (Nausea or vomiting). 30 tablet 1  . traMADol (ULTRAM) 50 MG tablet Take 1 tablet (50 mg total) by mouth every 6 (six) hours as needed. 30 tablet 0  . triamterene-hydrochlorothiazide (DYAZIDE) 37.5-25 MG capsule Take 1 capsule by mouth daily.  3   No current facility-administered medications for this encounter.     ECOG PERFORMANCE STATUS:  1 - Symptomatic but completely ambulatory  REVIEW OF SYSTEMS:  Patient denies any weight loss, fatigue, weakness, fever, chills or night sweats. Patient denies any loss of vision, blurred vision. Patient denies any ringing  of the ears or hearing loss.  No irregular heartbeat. Patient denies heart murmur or history of fainting. Patient denies any chest pain or pain radiating to her upper extremities. Patient denies any shortness of breath, difficulty breathing at night, cough or hemoptysis. Patient denies any swelling in the lower legs. Patient denies any nausea vomiting, vomiting of blood, or coffee ground material in the vomitus. Patient denies any stomach pain. Patient states has had normal bowel movements no significant constipation or diarrhea. Patient denies any dysuria, hematuria or significant nocturia. Patient denies any problems walking, swelling in the joints or loss of balance. Patient denies any skin changes, loss of hair or loss of weight. Patient denies any excessive worrying or anxiety or significant depression. Patient denies any problems with insomnia. Patient denies excessive thirst, polyuria, polydipsia. Patient denies any swollen glands, patient denies easy bruising or easy bleeding. Patient denies any recent infections, allergies or URI. Patient "s visual fields have not changed significantly in recent time.    PHYSICAL EXAM: There were no vitals taken for this visit. Well-developed well-nourished patient in NAD. HEENT reveals PERLA, EOMI,  discs not visualized.  Oral cavity is clear. No oral mucosal lesions are identified. Neck is clear without evidence of cervical or supraclavicular adenopathy. Lungs are clear to A&P. Cardiac examination is essentially unremarkable with regular rate and rhythm without murmur rub or thrill. Abdomen is benign with no organomegaly or masses noted. Motor sensory and DTR levels are equal and symmetric in the upper and lower extremities. Cranial nerves II through XII are grossly intact. Proprioception is intact. No peripheral adenopathy or edema is identified. No motor or sensory levels are noted. Crude visual fields are within normal range.  LABORATORY DATA: cytology pathology reports reviewed     RADIOLOGY RESULTS:CT scan PET CT scans reviewed and compatible with the above-stated findings   IMPRESSION: stage IV based on positive pleural cytology for squamous cell carcinoma in patient with left lung squamous cell carcinoma with chest wall involvement in 62 year old female.  PLAN: at this time I like to go ahead with palliative radiation therapy to her left chest wall. I will plan on delivering 4000 cGy in 20 fractions to the area of left chest wall and in [facility] the entire mass. I believe this will help with some of her chest wall pain symptoms and may decrease her use of narcotic analgesics. Risks and benefits of treatment including development of cough fatigue skin reaction possible radiation esophagitis all were discussed in detail with the patient. She seems to comprehend my treatment plan well. I personally set up and ordered CT simulation for later this week. There will be extra effort by both professional staff as well as technical staff to coordinate and manage concurrent chemoradiation and ensuing side effects during are treatments.case will be discussed with medical oncology. She will continue carbotaxol during treatment.  I would like to take this opportunity to thank you for allowing me to participate in the care of your patient.Noreene Filbert, MD

## 2018-06-07 NOTE — Progress Notes (Signed)
Patient here for follow up

## 2018-06-09 ENCOUNTER — Ambulatory Visit
Admission: RE | Admit: 2018-06-09 | Discharge: 2018-06-09 | Disposition: A | Discharge status: Home / Self Care | Payer: BLUE CROSS/BLUE SHIELD | Source: Ambulatory Visit | Attending: Radiation Oncology | Admitting: Radiation Oncology

## 2018-06-09 DIAGNOSIS — C7951 Secondary malignant neoplasm of bone: Secondary | ICD-10-CM | POA: Diagnosis not present

## 2018-06-09 DIAGNOSIS — C3412 Malignant neoplasm of upper lobe, left bronchus or lung: Secondary | ICD-10-CM | POA: Insufficient documentation

## 2018-06-09 DIAGNOSIS — Z510 Encounter for antineoplastic radiation therapy: Secondary | ICD-10-CM | POA: Diagnosis present

## 2018-06-10 DIAGNOSIS — C3412 Malignant neoplasm of upper lobe, left bronchus or lung: Secondary | ICD-10-CM | POA: Diagnosis not present

## 2018-06-16 ENCOUNTER — Ambulatory Visit: Payer: BLUE CROSS/BLUE SHIELD

## 2018-06-16 NOTE — Progress Notes (Signed)
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  --Diagnosed via bronchoscopy on 05/13/18. -Patient has been initiated on chemotherapy and radiation.  Left chest pain, likely due to lung cancer.  --Will prescribe a short course of narcotic pain medication.  --I expect her pain should improve with continued radiation therapy. She is asked to follow up with oncology for further pain medication.   Chronic respiratory failure.  --Patient developing acute rhinitis due to oxygen.  --Will add humidification.   Pulmonary fibrosis, likely related to underlying rheumatoid arthritis.  - Patient would not be a candidate for checkpoint inhibitor therapy due to presence of pulmonary fibrosis. - Continue treatment of underlying rheumatoid arthritis.  History of smoking.  --Quit in 11/22/17.   Meds ordered this encounter  Medications  . oxyCODONE (OXY IR/ROXICODONE) 5 MG immediate release tablet    Sig: Take 1 tablet (5 mg total) by mouth 2 (two) times daily for 15 days.    Dispense:  30 tablet    Refill:  0     Return in about 6 months (around 12/17/2018).   Date: 06/16/2018  MRN# [id number] Tamara Parrish 09/28/1955   Tamara Parrish is a 62 y.o. old female seen in follow up for chief complaint of  Chief Complaint  Patient presents with  . Follow-up     HPI:  The patient is a 62 year old female with a history of hypertension, hyperlipidemia, rheumatoid arthritis.  She was admitted recently with pneumonia with sepsis from 6/17 to 02/11/2018 subsequent EBUS bronchoscopy on 05/13/2018, results were positive for squamous cell lung cancer in the left upper lobe mass in the left hilar lymph node.  Biopsies of the subcarinal and right hilar lymph node were negative.  The patient has been set up with oncology and radonc, she has been initiated on chemo and radiation to the left chest wall.  She feels that her breathing has been doing  well. She is using oxygen at 2L at night. She does not use humidity.  She has been doing pain in her left posterior thorax. She was taking oxycodone in the past twice per day, she was then switched to tramadol but does not feels that it is helping.   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 **CT chest  05/05/2018 in comparison with previous 02/21/2018,>>  imaging personally reviewed.  The left lower lobe and right upper lobe fibrotic changes have improved.  However there is persistent masslike consolidation of the left upper lobe there are fibrotic changes of the lungs bibasilarly, greatest in the left, these are also seen in the lingula peripherally, right apex. There is masslike consolidation in the apical posterior segment of the left upper lobe.  There remains significant lymphadenopathy seen in the right hilar, subcarinal and right paratracheal area.  There is a pulmonary arterial enlargement suggestive of pulmonary hypertension. Opacification of the left upper lobe appears advanced in comparison to previous chest x-ray 2 weeks prior  Desat walk 04/04/18>> At rest on RA sat was 95% and HR 88. Pt walked 360 feet, mild dyspnea. Slow pace. Sat was 88% and HR 102.   Medication:    Current Outpatient Medications:  .  albuterol (PROVENTIL HFA;VENTOLIN HFA) 108 (90 Base) MCG/ACT inhaler, Inhale 2 puffs into the lungs every 6 (six) hours as  needed for wheezing or shortness of breath., Disp: 1 Inhaler, Rfl: 2 .  ALPRAZolam (XANAX) 0.25 MG tablet, Take 1 tablet (0.25 mg total) by mouth 2 (two) times daily as needed for anxiety., Disp: 30 tablet, Rfl: 0 .  amLODipine (NORVASC) 5 MG tablet, Take 5 mg by mouth daily., Disp: , Rfl: 3 .  aspirin 81 MG chewable tablet, Chew 1 tablet by mouth daily., Disp: , Rfl:  .  atorvastatin (LIPITOR) 40 MG tablet, Take 40 mg by mouth  daily., Disp: , Rfl: 3 .  dexamethasone (DECADRON) 4 MG tablet, Take 2 tablets (8 mg total) by mouth daily. Start the day after chemotherapy for 2 days., Disp: 30 tablet, Rfl: 1 .  ferrous sulfate 325 (65 FE) MG EC tablet, Take 1 tablet (325 mg total) by mouth 2 (two) times daily with a meal., Disp: 60 tablet, Rfl: 3 .  folic acid (FOLVITE) 1 MG tablet, Take 1 mg by mouth daily., Disp: , Rfl: 11 .  guaiFENesin-dextromethorphan (ROBITUSSIN DM) 100-10 MG/5ML syrup, Take 5 mLs by mouth every 4 (four) hours as needed for cough., Disp: 118 mL, Rfl: 0 .  hydroxychloroquine (PLAQUENIL) 200 MG tablet, 1 tab twice a day, 90 days, Disp: , Rfl:  .  Insulin Glargine (BASAGLAR KWIKPEN) 100 UNIT/ML SOPN, Inject 38 Units into the skin every morning. , Disp: , Rfl: 12 .  lidocaine-prilocaine (EMLA) cream, Apply to affected area once, Disp: 30 g, Rfl: 3 .  lisinopril (PRINIVIL,ZESTRIL) 40 MG tablet, Take 40 mg by mouth daily., Disp: , Rfl: 3 .  LORazepam (ATIVAN) 1 MG tablet, Take with you to MRI scan. (Patient not taking: Reported on 06/07/2018), Disp: 1 tablet, Rfl: 0 .  metFORMIN (GLUCOPHAGE) 1000 MG tablet, Take 1,000 mg by mouth 2 (two) times daily., Disp: , Rfl: 5 .  methotrexate (RHEUMATREX) 2.5 MG tablet, Take 2.5 mg by mouth once a week. Caution:Chemotherapy. Protect from light., Disp: , Rfl:  .  metoprolol tartrate (LOPRESSOR) 25 MG tablet, Take 25 mg by mouth 2 (two) times daily., Disp: , Rfl: 3 .  mirtazapine (REMERON) 7.5 MG tablet, Take 1 tablet (7.5 mg total) by mouth at bedtime., Disp: 30 tablet, Rfl: 0 .  ondansetron (ZOFRAN) 8 MG tablet, Take 1 tablet (8 mg total) by mouth 2 (two) times daily as needed for refractory nausea / vomiting. Start on day 3 after chemo., Disp: 30 tablet, Rfl: 1 .  ondansetron (ZOFRAN-ODT) 4 MG disintegrating tablet, Take 4 mg by mouth every 8 (eight) hours as needed for nausea. , Disp: , Rfl:  .  prochlorperazine (COMPAZINE) 10 MG tablet, Take 1 tablet (10 mg total) by  mouth every 6 (six) hours as needed (Nausea or vomiting)., Disp: 30 tablet, Rfl: 1 .  traMADol (ULTRAM) 50 MG tablet, Take 1 tablet (50 mg total) by mouth every 6 (six) hours as needed., Disp: 30 tablet, Rfl: 0 .  triamterene-hydrochlorothiazide (DYAZIDE) 37.5-25 MG capsule, Take 1 capsule by mouth daily., Disp: , Rfl: 3   Allergies:  Ibuprofen and Penicillins  Review of Systems:  Constitutional: Feels well. Cardiovascular: Denies chest pain, exertional chest pain.  Pulmonary: Denies hemoptysis, pleuritic chest pain.   The remainder of systems were reviewed and were found to be negative other than what is documented in the HPI.    Physical Examination:   VS: BP 124/60 (BP Location: Left Arm, Cuff Size: Normal)   Pulse 85   Resp 16   Ht 5\' 6"  (1.676 m)   Wt 186  lb (84.4 kg)   SpO2 92%   BMI 30.02 kg/m   General Appearance: No distress  Neuro:without focal findings, mental status, speech normal, alert and oriented HEENT: PERRLA, EOM intact Pulmonary: No wheezing, No rales  CardiovascularNormal S1,S2.  No m/r/g.  Abdomen: Benign, Soft, non-tender, No masses Renal:  No costovertebral tenderness  GU:  No performed at this time. Endoc: No evident thyromegaly, no signs of acromegaly or Cushing features Skin:   warm, no rashes, no ecchymosis  Extremities: normal, no cyanosis, clubbing.     LABORATORY PANEL:   CBC No results for input(s): WBC, HGB, HCT, PLT in the last 168 hours. ------------------------------------------------------------------------------------------------------------------  Chemistries  No results for input(s): NA, K, CL, CO2, GLUCOSE, BUN, CREATININE, CALCIUM, MG, AST, ALT, ALKPHOS, BILITOT in the last 168 hours.  Invalid input(s): GFRCGP ------------------------------------------------------------------------------------------------------------------  Cardiac Enzymes No results for input(s): TROPONINI in the last 168  hours. ------------------------------------------------------------  RADIOLOGY:   No results found for this or any previous visit. Results for orders placed during the hospital encounter of 02/07/18  DG Chest 2 View   Narrative CLINICAL DATA:  Shortness of breath and weakness, history of tobacco use  EXAM: CHEST - 2 VIEW  COMPARISON:  02/12/2014  FINDINGS: Cardiac shadow is within normal limits. The lungs are well aerated bilaterally with diffuse interstitial changes. A pleural-based wedge-shaped density is noted in the left upper lobe. Given the short-term history this likely represents an acute on chronic infiltrate. Patchy basilar infiltrate on the left is noted as well. No sizable effusion is seen. No acute bony abnormality is seen.  IMPRESSION: Changes most consistent with multifocal pneumonia. Followup PA and lateral chest X-ray is recommended in 3-4 weeks following trial of antibiotic therapy to ensure resolution and exclude underlying malignancy.   Electronically Signed   By: Inez Catalina M.D.   On: 02/07/2018 19:20    ------------------------------------------------------------------------------------------------------------------  Thank  you for allowing [facility] to assist in the care of your patient. Our recommendations are noted above.  Please contact us if we can be of further service.   Marda Stalker, M.D., F.C.C.P.  Board Certified in Internal Medicine, Pulmonary Medicine, [place], and Sleep Medicine.  [facility] Office Number: [phone]  06/16/2018

## 2018-06-17 ENCOUNTER — Ambulatory Visit (INDEPENDENT_AMBULATORY_CARE_PROVIDER_SITE_OTHER): Payer: BLUE CROSS/BLUE SHIELD | Admitting: Internal Medicine

## 2018-06-17 ENCOUNTER — Encounter: Payer: Self-pay | Admitting: Internal Medicine

## 2018-06-17 VITALS — BP 124/60 | HR 85 | Resp 16 | Ht 66.0 in | Wt 186.0 lb

## 2018-06-17 DIAGNOSIS — C3492 Malignant neoplasm of unspecified part of left bronchus or lung: Secondary | ICD-10-CM | POA: Diagnosis not present

## 2018-06-17 MED ORDER — OXYCODONE HCL 5 MG PO TABS: 5.0000 mg | ORAL_TABLET | Freq: Two times a day (BID) | ORAL | 0 refills | Status: DC

## 2018-06-17 NOTE — Patient Instructions (Signed)
Please call your home care provider for replacement oxygen tubing.  Will prescribe humidification for you oxygen.  Will prescribe a short course of pain medication.

## 2018-06-17 NOTE — Addendum Note (Signed)
Addended by: Stephanie Coup on: 06/17/2018 11:20 AM   Modules accepted: Orders

## 2018-06-20 ENCOUNTER — Ambulatory Visit
Admission: RE | Admit: 2018-06-20 | Discharge: 2018-06-20 | Disposition: A | Discharge status: Home / Self Care | Payer: BLUE CROSS/BLUE SHIELD | Source: Ambulatory Visit | Attending: Radiation Oncology | Admitting: Radiation Oncology

## 2018-06-20 ENCOUNTER — Ambulatory Visit: Payer: BLUE CROSS/BLUE SHIELD

## 2018-06-20 DIAGNOSIS — C3412 Malignant neoplasm of upper lobe, left bronchus or lung: Secondary | ICD-10-CM | POA: Diagnosis not present

## 2018-06-21 ENCOUNTER — Inpatient Hospital Stay: Payer: BLUE CROSS/BLUE SHIELD

## 2018-06-21 ENCOUNTER — Ambulatory Visit
Admission: RE | Admit: 2018-06-21 | Discharge: 2018-06-21 | Disposition: A | Discharge status: Home / Self Care | Payer: BLUE CROSS/BLUE SHIELD | Source: Ambulatory Visit | Attending: Radiation Oncology | Admitting: Radiation Oncology

## 2018-06-21 ENCOUNTER — Encounter: Payer: Self-pay | Admitting: Oncology

## 2018-06-21 ENCOUNTER — Inpatient Hospital Stay (HOSPITAL_BASED_OUTPATIENT_CLINIC_OR_DEPARTMENT_OTHER): Payer: BLUE CROSS/BLUE SHIELD | Admitting: Oncology

## 2018-06-21 ENCOUNTER — Other Ambulatory Visit: Payer: Self-pay

## 2018-06-21 VITALS — BP 137/78 | HR 81 | Temp 96.8°F | Resp 18 | Wt 186.2 lb

## 2018-06-21 VITALS — BP 118/73 | HR 85 | Temp 97.0°F | Resp 18

## 2018-06-21 DIAGNOSIS — C3412 Malignant neoplasm of upper lobe, left bronchus or lung: Secondary | ICD-10-CM

## 2018-06-21 DIAGNOSIS — Z5111 Encounter for antineoplastic chemotherapy: Secondary | ICD-10-CM | POA: Diagnosis not present

## 2018-06-21 DIAGNOSIS — M069 Rheumatoid arthritis, unspecified: Secondary | ICD-10-CM | POA: Diagnosis not present

## 2018-06-21 DIAGNOSIS — G893 Neoplasm related pain (acute) (chronic): Secondary | ICD-10-CM

## 2018-06-21 DIAGNOSIS — Z87891 Personal history of nicotine dependence: Secondary | ICD-10-CM

## 2018-06-21 DIAGNOSIS — C3490 Malignant neoplasm of unspecified part of unspecified bronchus or lung: Secondary | ICD-10-CM

## 2018-06-21 DIAGNOSIS — D649 Anemia, unspecified: Secondary | ICD-10-CM

## 2018-06-21 DIAGNOSIS — F419 Anxiety disorder, unspecified: Secondary | ICD-10-CM

## 2018-06-21 LAB — COMPREHENSIVE METABOLIC PANEL
ALBUMIN: 2.8 g/dL — AB (ref 3.5–5.0)
ALT: 7 U/L (ref 0–44)
ANION GAP: 7 (ref 5–15)
AST: 16 U/L (ref 15–41)
Alkaline Phosphatase: 92 U/L (ref 38–126)
BILIRUBIN TOTAL: 0.3 mg/dL (ref 0.3–1.2)
BUN: 21 mg/dL (ref 8–23)
CO2: 26 mmol/L (ref 22–32)
Calcium: 9.1 mg/dL (ref 8.9–10.3)
Chloride: 107 mmol/L (ref 98–111)
Creatinine, Ser: 1.13 mg/dL — ABNORMAL HIGH (ref 0.44–1.00)
GFR calc Af Amer: 58 mL/min — ABNORMAL LOW (ref >60)
GFR, EST NON AFRICAN AMERICAN: 50 mL/min — AB (ref >60)
Glucose, Bld: 138 mg/dL — ABNORMAL HIGH (ref 70–99)
POTASSIUM: 4.2 mmol/L (ref 3.5–5.1)
Sodium: 140 mmol/L (ref 135–145)
TOTAL PROTEIN: 7.5 g/dL (ref 6.5–8.1)

## 2018-06-21 LAB — CBC WITH DIFFERENTIAL/PLATELET
ABS IMMATURE GRANULOCYTES: 0.07 10*3/uL (ref 0.00–0.07)
BASOS ABS: 0.1 10*3/uL (ref 0.0–0.1)
BASOS PCT: 1 %
Eosinophils Absolute: 0.2 10*3/uL (ref 0.0–0.5)
Eosinophils Relative: 1 %
HCT: 28.4 % — ABNORMAL LOW (ref 36.0–46.0)
Hemoglobin: 8.7 g/dL — ABNORMAL LOW (ref 12.0–15.0)
IMMATURE GRANULOCYTES: 1 %
Lymphocytes Relative: 25 %
Lymphs Abs: 3.3 10*3/uL (ref 0.7–4.0)
MCH: 27.4 pg (ref 26.0–34.0)
MCHC: 30.6 g/dL (ref 30.0–36.0)
MCV: 89.6 fL (ref 80.0–100.0)
Monocytes Absolute: 0.9 10*3/uL (ref 0.1–1.0)
Monocytes Relative: 7 %
NEUTROS ABS: 8.5 10*3/uL — AB (ref 1.7–7.7)
NEUTROS PCT: 65 %
Platelets: 500 10*3/uL — ABNORMAL HIGH (ref 150–400)
RBC: 3.17 MIL/uL — AB (ref 3.87–5.11)
RDW: 14.6 % (ref 11.5–15.5)
WBC: 13.1 10*3/uL — AB (ref 4.0–10.5)
nRBC: 0 % (ref 0.0–0.2)

## 2018-06-21 MED ORDER — PALONOSETRON HCL INJECTION 0.25 MG/5ML
0.2500 mg | Freq: Once | INTRAVENOUS | Status: AC
  Administered 2018-06-21: 0.25 mg via INTRAVENOUS
  Filled 2018-06-21: qty 5

## 2018-06-21 MED ORDER — FAMOTIDINE IN NACL 20-0.9 MG/50ML-% IV SOLN
20.0000 mg | Freq: Once | INTRAVENOUS | Status: AC
  Administered 2018-06-21: 20 mg via INTRAVENOUS
  Filled 2018-06-21: qty 50

## 2018-06-21 MED ORDER — SODIUM CHLORIDE 0.9 % IV SOLN
450.0000 mg | Freq: Once | INTRAVENOUS | Status: AC
  Administered 2018-06-21: 450 mg via INTRAVENOUS
  Filled 2018-06-21: qty 45

## 2018-06-21 MED ORDER — SODIUM CHLORIDE 0.9% FLUSH
10.0000 mL | INTRAVENOUS | Status: DC | PRN
  Administered 2018-06-21: 10 mL via INTRAVENOUS
  Filled 2018-06-21: qty 10

## 2018-06-21 MED ORDER — SODIUM CHLORIDE 0.9 % IV SOLN
20.0000 mg | Freq: Once | INTRAVENOUS | Status: AC
  Administered 2018-06-21: 20 mg via INTRAVENOUS
  Filled 2018-06-21: qty 2

## 2018-06-21 MED ORDER — DIPHENHYDRAMINE HCL 50 MG/ML IJ SOLN
50.0000 mg | Freq: Once | INTRAMUSCULAR | Status: AC
  Administered 2018-06-21: 50 mg via INTRAVENOUS
  Filled 2018-06-21: qty 1

## 2018-06-21 MED ORDER — ALPRAZOLAM 0.25 MG PO TABS: 0.2500 mg | ORAL_TABLET | Freq: Two times a day (BID) | ORAL | 0 refills | Status: DC | PRN

## 2018-06-21 MED ORDER — LORAZEPAM 2 MG/ML IJ SOLN: 0.5000 mg | Freq: Once | INTRAMUSCULAR | Status: DC | PRN

## 2018-06-21 MED ORDER — HEPARIN SOD (PORK) LOCK FLUSH 100 UNIT/ML IV SOLN
500.0000 [IU] | Freq: Once | INTRAVENOUS | Status: AC
  Administered 2018-06-21: 500 [IU] via INTRAVENOUS
  Filled 2018-06-21: qty 5

## 2018-06-21 MED ORDER — SODIUM CHLORIDE 0.9 % IV SOLN
Freq: Once | INTRAVENOUS | Status: AC
  Administered 2018-06-21: 10:00:00 via INTRAVENOUS
  Filled 2018-06-21: qty 250

## 2018-06-21 MED ORDER — PROCHLORPERAZINE EDISYLATE 10 MG/2ML IJ SOLN
10.0000 mg | Freq: Once | INTRAMUSCULAR | Status: AC
  Administered 2018-06-21: 10 mg via INTRAVENOUS
  Filled 2018-06-21: qty 2

## 2018-06-21 MED ORDER — SODIUM CHLORIDE 0.9 % IV SOLN
175.0000 mg/m2 | Freq: Once | INTRAVENOUS | Status: AC
  Administered 2018-06-21: 342 mg via INTRAVENOUS
  Filled 2018-06-21: qty 57

## 2018-06-21 MED ORDER — HEPARIN SOD (PORK) LOCK FLUSH 100 UNIT/ML IV SOLN
INTRAVENOUS | Status: AC
  Filled 2018-06-21: qty 5

## 2018-06-21 NOTE — Progress Notes (Signed)
Patient here for follow up. Pt requesting refill for xanax.

## 2018-06-21 NOTE — Progress Notes (Signed)
11:40 - Patient approximately 28 minutes into three hour Taxol and complains of nausea, throwing up, sweating, stopped Taxol, increased fluids, checked vitals and called Lauren, NP.  Lauren came to see patient, put in order for IV Compazine.  Lauren, NP, instructed to wait thirty minutes and she consulted with Dr. Tasia Catchings regarding moving forward with treatment.  12:15 - Patient reports nausea better, does not feel she needs further medicine at this time.  Per Ander Purpura, NP, Dr. Tasia Catchings instructed to begin Taxol again and monitor patient, order for Ativan in if needed for further nausea.  12:22 - restarted Taxol, per Dr. Tasia Catchings instructions

## 2018-06-21 NOTE — Progress Notes (Addendum)
Hematology/Oncology follow up note [facility] [phone] Fax:([phone]) [phone]   Patient Care Team: Glendon Axe, MD as PCP - General (Internal Medicine) Telford Nab, RN as Registered Nurse  REFERRING PROVIDER: Dr. Felicie Morn REASON FOR VISIT Follow up for treatment of  lung cancer  HISTORY OF PRESENTING ILLNESS:  Tamara Parrish is a  62 y.o.  female with PMH listed below who was referred to me for evaluation of newly diagnosed lung cancer. Patient was admitted at the end of June and beginning of July for 2 times due to recurrent community-acquired pneumonia despite treating with antibiotics.  CT skin showed a left upper lobe consolidation suspicious malignancy enlarged bilateral hilar and mediastinal lymph nodes.  Patient was referred to see pulmonology Dr. Felicie Morn.   CT chest7/08/2017, in comparison with recent chest x-rays>>imaging personally reviewed, there are fibrotic changes of the lungs bibasilarly, greatest in the left, these are also seen in the lingula peripherally, right apex. There is masslike consolidation in the apical posterior segment of the left upper lobe. There is significant lymphadenopathy seen in the right hilar, subcarinal and right paratracheal area. There is a pulmonary arterial enlargement suggestive of pulmonary hypertension. Opacification of the left upper lobe appears advanced in comparison to previous chest x-ray 2 weeks prior 05/05/2018 CT chest,  with interval increase in size of large area of masslike consolidation within the left upper lobe which now appears to involve the adjacent chest wall as well as the left third and fourth ribs.  Mediastinal and hilar adenopathy, Patient underwent E bus bronchoscopy with E bus guided biopsy. Left upper lobe lavage was atypical cells consistent with squamous cell carcinoma. Left upper lobe brushing is positive for squamous cell carcinoma Subcarinal lymph node biopsy  negative for malignancy Right hilar lymph node biopsy negative for malignancy. Left upper lobe transbronchial biopsy positive for squamous cell carcinoma. Patient was referred to cancer center to establish cancer care with me. Today patient was accompanied by multiple family members including daughters and partner.  Denies any headache, shortness of breath, chest pain, abdominal pain, leg swelling.fever or chill.  Has history of rheumatoid arthritis follows up with Dr. Meda Coffee at [facility] Patient is on methotrexate  weekly, and  Remicade treatment. Reports persistent left posterior chest wall pain, pleuritic, i worsened with deep breath. She lives with daughters  INTERVAL HISTORY Tamara Parrish is a 62 y.o. female who has above history reviewed by me today presents for assessment for prior to cycle 2 chemotherapy treatment for stage IV non-small cell lung cancer.  #She has been seen by radiation oncology and will be started on palliative radiation to her chest wall.  Planned 20 fractions.  #Chemotherapy, tolerates well.  Manageable nausea.  No vomiting.  No diarrhea. #Fatigue has worsened since chemotherapy.  Not associated with other symptoms. #Left posterior chest wall pain, improved after taking pain medication. #Rheumatoid arthritis, patient has been off methotrexate.  Started on Plaquenil.  Reports that joint pain has been well controlled. #Anxiety, reports still feeling anxious.  Patient requests to refill her" anxiety pills".  Reports taking for anxiety twice daily and has been helpful to calm her down. I also send her prescription on Remeron 7.5 mg at night.  Patient is not familiar about the name of the medication and is not clear if she is taking it or not.  I asked the patient to bring her anxiety medication to clinic at next visit.    Review of Systems  Constitutional: Positive for  malaise/fatigue. Negative for chills and fever.  HENT: Negative for nosebleeds and sore  throat.   Eyes: Negative for double vision, photophobia and redness.  Respiratory: Negative for cough, shortness of breath and wheezing.           Cardiovascular: Negative for chest pain, palpitations and orthopnea.  Gastrointestinal: Negative for abdominal pain, blood in stool, nausea and vomiting.  Genitourinary: Negative for dysuria.  Musculoskeletal: Negative for back pain, myalgias and neck pain.  Skin: Negative for itching and rash.  Neurological: Negative for dizziness, tingling and tremors.  Endo/Heme/Allergies: Negative for environmental allergies. Does not bruise/bleed easily.  Psychiatric/Behavioral: Negative for depression. The patient is nervous/anxious. The patient does not have insomnia.     MEDICAL HISTORY:  Past Medical History:  Diagnosis Date  . DM2 (diabetes mellitus, type 2) ([place])   . Dyspnea   . Hyperlipemia   . Hypertension   . Osteomyelitis ([place])   . Pneumonia   . RA (rheumatoid arthritis) ([place])   . Rheumatoid arthritis ([place])     SURGICAL HISTORY: Past Surgical History:  Procedure Laterality Date  . ENDOBRONCHIAL ULTRASOUND N/A 05/13/2018   Procedure: ENDOBRONCHIAL ULTRASOUND;  Surgeon: [name], MD;  Location: [facility] ORS;  Service: Pulmonary;  Laterality: N/A;  . PORTA CATH INSERTION N/A 05/27/2018   Procedure: PORTA CATH INSERTION;  Surgeon: Katha Cabal, MD;  Location: [facility];  Service: Cardiovascular;  Laterality: N/A;  . TOE SURGERY      SOCIAL HISTORY: Social History   Socioeconomic History  . Marital status: Single    Spouse name: Not on file  . Number of children: 2  . Years of education: Not on file  . Highest education level: Not on file  Occupational History  . Not on file  Social Needs  . Financial resource strain: Not very hard  . Food insecurity:    Worry: Never true    Inability: Never true  . Transportation needs:    Medical: No    Non-medical: No  Tobacco Use  . Smoking status: Former  Smoker    Packs/day: 1.00    Years: 40.00    Pack years: 40.00    Types: Cigarettes    Last attempt to quit: 11/22/2017    Years since quitting: 0.5  . Smokeless tobacco: Never Used  . Tobacco comment: quit 2 weeks ago  Substance and Sexual Activity  . Alcohol use: Never    Frequency: Never  . Drug use: Never  . Sexual activity: Not on file  Lifestyle  . Physical activity:    Days per week: 0 days    Minutes per session: Not on file  . Stress: To some extent  Relationships  . Social connections:    Talks on phone: Three times a week    Gets together: More than three times a week    Attends religious service: Not on file    Active member of club or organization: Not on file    Attends meetings of clubs or organizations: Not on file    Relationship status: Not on file  . Intimate partner violence:    Fear of current or ex partner: No    Emotionally abused: No    Physically abused: No    Forced sexual activity: No  Other Topics Concern  . Not on file  Social History Narrative   Living at home with daughter.  Ambulates with a walker at baseline.    FAMILY HISTORY: Family History  Problem  Relation Age of Onset  . Diabetes Mother   . Lung cancer Father     ALLERGIES:  is allergic to ibuprofen and penicillins.  MEDICATIONS:  Current Outpatient Medications  Medication Sig Dispense Refill  . albuterol (PROVENTIL HFA;VENTOLIN HFA) 108 (90 Base) MCG/ACT inhaler Inhale 2 puffs into the lungs every 6 (six) hours as needed for wheezing or shortness of breath. 1 Inhaler 2  . ALPRAZolam (XANAX) 0.25 MG tablet Take 1 tablet (0.25 mg total) by mouth 2 (two) times daily as needed for anxiety. 30 tablet 0  . amLODipine (NORVASC) 5 MG tablet Take 5 mg by mouth daily.  3  . aspirin 81 MG chewable tablet Chew 1 tablet by mouth daily.    Marland Kitchen atorvastatin (LIPITOR) 40 MG tablet Take 40 mg by mouth daily.  3  . dexamethasone (DECADRON) 4 MG tablet Take 2 tablets (8 mg total) by mouth daily.  Start the day after chemotherapy for 2 days. 30 tablet 1  . ferrous sulfate 325 (65 FE) MG EC tablet Take 1 tablet (325 mg total) by mouth 2 (two) times daily with a meal. 60 tablet 3  . folic acid (FOLVITE) 1 MG tablet Take 1 mg by mouth daily.  11  . hydroxychloroquine (PLAQUENIL) 200 MG tablet 1 tab twice a day, 90 days    . Insulin Glargine (BASAGLAR KWIKPEN) 100 UNIT/ML SOPN Inject 38 Units into the skin every morning.   12  . lidocaine-prilocaine (EMLA) cream Apply to affected area once 30 g 3  . lisinopril (PRINIVIL,ZESTRIL) 40 MG tablet Take 40 mg by mouth daily.  3  . metFORMIN (GLUCOPHAGE) 1000 MG tablet Take 1,000 mg by mouth 2 (two) times daily.  5  . metoprolol tartrate (LOPRESSOR) 25 MG tablet Take 25 mg by mouth 2 (two) times daily.  3  . mirtazapine (REMERON) 7.5 MG tablet Take 1 tablet (7.5 mg total) by mouth at bedtime. 30 tablet 0  . ondansetron (ZOFRAN) 8 MG tablet Take 1 tablet (8 mg total) by mouth 2 (two) times daily as needed for refractory nausea / vomiting. Start on day 3 after chemo. 30 tablet 1  . oxyCODONE (OXY IR/ROXICODONE) 5 MG immediate release tablet Take 1 tablet (5 mg total) by mouth 2 (two) times daily for 15 days. 30 tablet 0  . prochlorperazine (COMPAZINE) 10 MG tablet Take 1 tablet (10 mg total) by mouth every 6 (six) hours as needed (Nausea or vomiting). 30 tablet 1  . traMADol (ULTRAM) 50 MG tablet Take 1 tablet (50 mg total) by mouth every 6 (six) hours as needed. 30 tablet 0  . triamterene-hydrochlorothiazide (DYAZIDE) 37.5-25 MG capsule Take 1 capsule by mouth daily.  3  . guaiFENesin-dextromethorphan (ROBITUSSIN DM) 100-10 MG/5ML syrup Take 5 mLs by mouth every 4 (four) hours as needed for cough. (Patient not taking: Reported on 06/21/2018) 118 mL 0  . LORazepam (ATIVAN) 1 MG tablet Take with you to MRI scan. (Patient not taking: Reported on 06/21/2018) 1 tablet 0  . ondansetron (ZOFRAN-ODT) 4 MG disintegrating tablet Take 4 mg by mouth every 8 (eight)  hours as needed for nausea.      No current facility-administered medications for this visit.      PHYSICAL EXAMINATION: ECOG PERFORMANCE STATUS: 1 - Symptomatic but completely ambulatory Vitals:   06/21/18 0907  BP: 137/78  Pulse: 81  Resp: 18  Temp: (!) 96.8 F (36 C)  SpO2: 96%   Filed Weights   06/21/18 0907  Weight: 186 lb  3.2 oz (84.5 kg)    Physical Exam  Constitutional: She is oriented to person, place, and time. No distress.  HENT:  Head: Normocephalic and atraumatic.  Mouth/Throat: Oropharynx is clear and moist.  Eyes: Pupils are equal, round, and reactive to light. EOM are normal. No scleral icterus.  Neck: Normal range of motion. Neck supple.  Cardiovascular: Normal rate, regular rhythm and normal heart sounds.  Pulmonary/Chest: Effort normal. No respiratory distress. She has no wheezes. She exhibits no tenderness.  Decreased breath sounds bilaterally  Abdominal: Soft. Bowel sounds are normal. She exhibits no distension and no mass. There is no tenderness.  Musculoskeletal: Normal range of motion. She exhibits no edema or deformity.  Neurological: She is alert and oriented to person, place, and time. No cranial nerve deficit. Coordination normal.  Skin: Skin is warm and dry. No rash noted. No erythema.  Psychiatric: She has a normal mood and affect. Her behavior is normal. Thought content normal.     LABORATORY DATA:  I have reviewed the data as listed Lab Results  Component Value Date   WBC 13.1 (H) 06/21/2018   HGB 8.7 (L) 06/21/2018   HCT 28.4 (L) 06/21/2018   MCV 89.6 06/21/2018   PLT 500 (H) 06/21/2018   Recent Labs    05/31/18 0813 06/07/18 0926 06/21/18 0825  NA 135 139 140  K 4.2 4.2 4.2  CL 101 109 107  CO2 25 22 26   GLUCOSE 198* 143* 138*  BUN 15 24* 21  CREATININE 0.82 0.87 1.13*  CALCIUM 9.6 8.8* 9.1  GFRNONAA >60 >60 50*  GFRAA >60 >60 58*  PROT 7.6 6.8 7.5  ALBUMIN 2.8* 2.7* 2.8*  AST 18 17 16   ALT 6 8 7   ALKPHOS 94 104 92   BILITOT 0.4 0.4 0.3   Iron/TIBC/Ferritin/ %Sat    Component Value Date/Time   IRON 27 (L) 05/25/2018 1207   TIBC 241 (L) 05/25/2018 1207   FERRITIN 181 05/25/2018 1207   IRONPCTSAT 11 05/25/2018 1207   RADIOGRAPHIC STUDIES: I have personally reviewed the radiological images as listed and agreed with the findings in the report.  05/24/2018 PET scan  1. Highly hypermetabolic left upper lobe mass, maximum SUV 16.9, with chest wall invasion of the left third and fourth ribs and into the third intercostal space. 2. Separate pleural tumor deposit on the left at the T3-4 level medially near the neural foramen. 3. Hypermetabolic AP window lymph nodes. Probable left hilar lymph nodes confluent with the mass. Separate tumor nodules are noted in the left upper lobe. 4. Hypermetabolic sacrococcygeal junction without a well-defined lesion, probably from a fracture, less likely from early metastatic disease.   ASSESSMENT & PLAN:  1. Malignant neoplasm of lung, unspecified laterality, unspecified part of lung ([place])   2. Rheumatoid arthritis, involving unspecified site, unspecified rheumatoid factor presence ([place])   3. Anemia, unspecified type   4. 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  [id number] disease at least, if sacrococcygeal junction turn out to be involved, then she has M1b disease.  Labs are reviewed and discussed with patient.  Counts acceptable.  Proceed with cycle 2 carboplatin and Taxol Due to her rheumatoid arthritis, immunotherapy is not initiated. Given that patient has been started on palliative radiation to chest wall, will hold G-CSF. Patient follow-up in the clinic in 1 week with repeat testing of CBC. Plan repeat CT scan after 3 cycles.   #Awaiting MRI of brain to  be done. #Anxiety, advised patient to continue Xanax 0.25mg  twice daily as needed.  Recommend continue take Remeron 7.5 nightly. I advised patient to bring her anxiety  medication to clinic at next visit.  #RA, off methotrexate and Remicade.  Continue Plaquenil. #Local chest wall invasion/neoplasm pain,  #Anemia, multifactorial.  Chronic inflammation plus minus low iron store was decreased iron saturation.  Hemoglobin worsened, likely due to chemotherapy.   We spent sufficient time to discuss many aspect of care, questions were answered to patient's satisfaction.   Return of visit: 1 week   Earlie Server, MD, PhD Hematology Oncology [facility] Pager- [phone] 06/21/2018

## 2018-06-22 ENCOUNTER — Ambulatory Visit
Admission: RE | Admit: 2018-06-22 | Discharge: 2018-06-22 | Disposition: A | Discharge status: Home / Self Care | Payer: BLUE CROSS/BLUE SHIELD | Source: Ambulatory Visit | Attending: Radiation Oncology | Admitting: Radiation Oncology

## 2018-06-22 DIAGNOSIS — C3412 Malignant neoplasm of upper lobe, left bronchus or lung: Secondary | ICD-10-CM | POA: Diagnosis not present

## 2018-06-22 NOTE — Progress Notes (Signed)
06/21/18 12:41 - patient complains of tickle in her throat and slight shortness of breath, no nausea or pain, but states she gets this sometimes, stopped Taxol, increased fluids, checked vitals, called Lauren, NP to inform.  Patient sat up, states she is feeling better, Lauren/Dr. Tasia Catchings instructs to continue with Taxol and monitor patient.  Restarted Taxol at 12:51 and patient able to tolerate treatment with no further symptoms or complaints.

## 2018-06-23 ENCOUNTER — Inpatient Hospital Stay: Payer: BLUE CROSS/BLUE SHIELD

## 2018-06-23 ENCOUNTER — Ambulatory Visit
Admission: RE | Admit: 2018-06-23 | Discharge: 2018-06-23 | Disposition: A | Discharge status: Home / Self Care | Payer: BLUE CROSS/BLUE SHIELD | Source: Ambulatory Visit | Attending: Radiation Oncology | Admitting: Radiation Oncology

## 2018-06-23 DIAGNOSIS — C3412 Malignant neoplasm of upper lobe, left bronchus or lung: Secondary | ICD-10-CM | POA: Diagnosis not present

## 2018-06-24 ENCOUNTER — Ambulatory Visit
Admission: RE | Admit: 2018-06-24 | Discharge: 2018-06-24 | Disposition: A | Discharge status: Home / Self Care | Payer: BLUE CROSS/BLUE SHIELD | Source: Ambulatory Visit | Attending: Radiation Oncology | Admitting: Radiation Oncology

## 2018-06-24 DIAGNOSIS — Z510 Encounter for antineoplastic radiation therapy: Secondary | ICD-10-CM | POA: Insufficient documentation

## 2018-06-24 DIAGNOSIS — C7951 Secondary malignant neoplasm of bone: Secondary | ICD-10-CM | POA: Insufficient documentation

## 2018-06-24 DIAGNOSIS — C3412 Malignant neoplasm of upper lobe, left bronchus or lung: Secondary | ICD-10-CM | POA: Insufficient documentation

## 2018-06-26 ENCOUNTER — Other Ambulatory Visit: Payer: BLUE CROSS/BLUE SHIELD

## 2018-06-27 ENCOUNTER — Ambulatory Visit
Admission: RE | Admit: 2018-06-27 | Discharge: 2018-06-27 | Disposition: A | Discharge status: Home / Self Care | Payer: BLUE CROSS/BLUE SHIELD | Source: Ambulatory Visit | Attending: Radiation Oncology | Admitting: Radiation Oncology

## 2018-06-27 ENCOUNTER — Other Ambulatory Visit: Payer: Self-pay | Admitting: Oncology

## 2018-06-27 DIAGNOSIS — C3490 Malignant neoplasm of unspecified part of unspecified bronchus or lung: Secondary | ICD-10-CM

## 2018-06-27 DIAGNOSIS — C3412 Malignant neoplasm of upper lobe, left bronchus or lung: Secondary | ICD-10-CM | POA: Diagnosis not present

## 2018-06-28 ENCOUNTER — Ambulatory Visit
Admission: RE | Admit: 2018-06-28 | Discharge: 2018-06-28 | Disposition: A | Discharge status: Home / Self Care | Payer: BLUE CROSS/BLUE SHIELD | Source: Ambulatory Visit | Attending: Radiation Oncology | Admitting: Radiation Oncology

## 2018-06-28 DIAGNOSIS — C3412 Malignant neoplasm of upper lobe, left bronchus or lung: Secondary | ICD-10-CM | POA: Diagnosis not present

## 2018-06-28 LAB — ACID FAST CULTURE WITH REFLEXED SENSITIVITIES

## 2018-06-28 LAB — ACID FAST CULTURE WITH REFLEXED SENSITIVITIES (MYCOBACTERIA): Acid Fast Culture: NEGATIVE

## 2018-06-29 ENCOUNTER — Inpatient Hospital Stay: Payer: BLUE CROSS/BLUE SHIELD | Attending: Oncology

## 2018-06-29 ENCOUNTER — Other Ambulatory Visit: Payer: Self-pay

## 2018-06-29 ENCOUNTER — Encounter: Payer: Self-pay | Admitting: Oncology

## 2018-06-29 ENCOUNTER — Inpatient Hospital Stay (HOSPITAL_BASED_OUTPATIENT_CLINIC_OR_DEPARTMENT_OTHER): Payer: BLUE CROSS/BLUE SHIELD | Admitting: Oncology

## 2018-06-29 ENCOUNTER — Ambulatory Visit
Admission: RE | Admit: 2018-06-29 | Discharge: 2018-06-29 | Disposition: A | Discharge status: Home / Self Care | Payer: BLUE CROSS/BLUE SHIELD | Source: Ambulatory Visit | Attending: Radiation Oncology | Admitting: Radiation Oncology

## 2018-06-29 VITALS — BP 146/81 | HR 83 | Temp 96.5°F | Resp 18 | Wt 183.4 lb

## 2018-06-29 DIAGNOSIS — M069 Rheumatoid arthritis, unspecified: Secondary | ICD-10-CM | POA: Insufficient documentation

## 2018-06-29 DIAGNOSIS — R5383 Other fatigue: Secondary | ICD-10-CM | POA: Diagnosis not present

## 2018-06-29 DIAGNOSIS — C3412 Malignant neoplasm of upper lobe, left bronchus or lung: Secondary | ICD-10-CM | POA: Insufficient documentation

## 2018-06-29 DIAGNOSIS — Z87891 Personal history of nicotine dependence: Secondary | ICD-10-CM | POA: Insufficient documentation

## 2018-06-29 DIAGNOSIS — D6481 Anemia due to antineoplastic chemotherapy: Secondary | ICD-10-CM | POA: Diagnosis not present

## 2018-06-29 DIAGNOSIS — D649 Anemia, unspecified: Secondary | ICD-10-CM | POA: Diagnosis not present

## 2018-06-29 DIAGNOSIS — F419 Anxiety disorder, unspecified: Secondary | ICD-10-CM

## 2018-06-29 DIAGNOSIS — C3490 Malignant neoplasm of unspecified part of unspecified bronchus or lung: Secondary | ICD-10-CM

## 2018-06-29 DIAGNOSIS — G620 Drug-induced polyneuropathy: Secondary | ICD-10-CM | POA: Insufficient documentation

## 2018-06-29 DIAGNOSIS — Z5111 Encounter for antineoplastic chemotherapy: Secondary | ICD-10-CM | POA: Insufficient documentation

## 2018-06-29 LAB — CBC WITH DIFFERENTIAL/PLATELET
ABS IMMATURE GRANULOCYTES: 0.03 10*3/uL (ref 0.00–0.07)
BASOS PCT: 1 %
Basophils Absolute: 0.1 10*3/uL (ref 0.0–0.1)
EOS PCT: 5 %
Eosinophils Absolute: 0.3 10*3/uL (ref 0.0–0.5)
HCT: 26.7 % — ABNORMAL LOW (ref 36.0–46.0)
HEMOGLOBIN: 8.2 g/dL — AB (ref 12.0–15.0)
Immature Granulocytes: 1 %
Lymphocytes Relative: 26 %
Lymphs Abs: 1.6 10*3/uL (ref 0.7–4.0)
MCH: 27.5 pg (ref 26.0–34.0)
MCHC: 30.7 g/dL (ref 30.0–36.0)
MCV: 89.6 fL (ref 80.0–100.0)
MONO ABS: 0.5 10*3/uL (ref 0.1–1.0)
MONOS PCT: 7 %
NEUTROS ABS: 3.8 10*3/uL (ref 1.7–7.7)
NRBC: 0 % (ref 0.0–0.2)
Neutrophils Relative %: 60 %
PLATELETS: 388 10*3/uL (ref 150–400)
RBC: 2.98 MIL/uL — AB (ref 3.87–5.11)
RDW: 14.6 % (ref 11.5–15.5)
WBC: 6.3 10*3/uL (ref 4.0–10.5)

## 2018-06-29 LAB — COMPREHENSIVE METABOLIC PANEL
ALT: 9 U/L (ref 0–44)
AST: 14 U/L — ABNORMAL LOW (ref 15–41)
Albumin: 3 g/dL — ABNORMAL LOW (ref 3.5–5.0)
Alkaline Phosphatase: 74 U/L (ref 38–126)
Anion gap: 4 — ABNORMAL LOW (ref 5–15)
BUN: 37 mg/dL — ABNORMAL HIGH (ref 8–23)
CALCIUM: 9.3 mg/dL (ref 8.9–10.3)
CHLORIDE: 108 mmol/L (ref 98–111)
CO2: 19 mmol/L — AB (ref 22–32)
Creatinine, Ser: 1.16 mg/dL — ABNORMAL HIGH (ref 0.44–1.00)
GFR calc non Af Amer: 49 mL/min — ABNORMAL LOW (ref >60)
GFR, EST AFRICAN AMERICAN: 56 mL/min — AB (ref >60)
Glucose, Bld: 137 mg/dL — ABNORMAL HIGH (ref 70–99)
Potassium: 5 mmol/L (ref 3.5–5.1)
SODIUM: 131 mmol/L — AB (ref 135–145)
Total Bilirubin: 0.3 mg/dL (ref 0.3–1.2)
Total Protein: 7.3 g/dL (ref 6.5–8.1)

## 2018-06-29 LAB — SAMPLE TO BLOOD BANK

## 2018-06-29 MED ORDER — OXYCODONE HCL 5 MG PO TABS: 5.0000 mg | ORAL_TABLET | Freq: Four times a day (QID) | ORAL | 0 refills | Status: DC | PRN

## 2018-06-29 NOTE — Progress Notes (Signed)
Patient here for follow up. Pt states she is a lot of pain when her rheumatoid arthritis flares up and wants to know if there is something she can take to help her.

## 2018-06-30 ENCOUNTER — Ambulatory Visit
Admission: RE | Admit: 2018-06-30 | Discharge: 2018-06-30 | Disposition: A | Discharge status: Home / Self Care | Payer: BLUE CROSS/BLUE SHIELD | Source: Ambulatory Visit | Attending: Radiation Oncology | Admitting: Radiation Oncology

## 2018-06-30 DIAGNOSIS — C3412 Malignant neoplasm of upper lobe, left bronchus or lung: Secondary | ICD-10-CM | POA: Diagnosis not present

## 2018-06-30 NOTE — Progress Notes (Signed)
Hematology/Oncology follow up note [facility] [phone] [phone]   Patient Care Team: Glendon Axe, MD as PCP - General (Internal Medicine) Telford Nab, RN as Registered Nurse  REFERRING PROVIDER: Dr. Felicie Morn REASON FOR VISIT Follow up for treatment of  lung cancer  HISTORY OF PRESENTING ILLNESS:  Tamara Parrish is a  62 y.o.  female with PMH listed below who was referred to me for evaluation of newly diagnosed lung cancer. Patient was admitted at the end of June and beginning of July for 2 times due to recurrent community-acquired pneumonia despite treating with antibiotics.  CT skin showed a left upper lobe consolidation suspicious malignancy enlarged bilateral hilar and mediastinal lymph nodes.  Patient was referred to see pulmonology Dr. Felicie Morn.   CT chest7/08/2017, in comparison with recent chest x-rays>>imaging personally reviewed, there are fibrotic changes of the lungs bibasilarly, greatest in the left, these are also seen in the lingula peripherally, right apex. There is masslike consolidation in the apical posterior segment of the left upper lobe. There is significant lymphadenopathy seen in the right hilar, subcarinal and right paratracheal area. There is a pulmonary arterial enlargement suggestive of pulmonary hypertension. Opacification of the left upper lobe appears advanced in comparison to previous chest x-ray 2 weeks prior 05/05/2018 CT chest,  with interval increase in size of large area of masslike consolidation within the left upper lobe which now appears to involve the adjacent chest wall as well as the left third and fourth ribs.  Mediastinal and hilar adenopathy, Patient underwent E bus bronchoscopy with E bus guided biopsy. Left upper lobe lavage was atypical cells consistent with squamous cell carcinoma. Left upper lobe brushing is positive for squamous cell carcinoma Subcarinal lymph node biopsy  negative for malignancy Right hilar lymph node biopsy negative for malignancy. Left upper lobe transbronchial biopsy positive for squamous cell carcinoma. Patient was referred to cancer center to establish cancer care with me. Today patient was accompanied by multiple family members including daughters and partner.  Denies any headache, shortness of breath, chest pain, abdominal pain, leg swelling.fever or chill.  Has history of rheumatoid arthritis follows up with Dr. Meda Coffee at [facility] clinic Patient is on methotrexate  weekly, and  Remicade treatment. Reports persistent left posterior chest wall pain, pleuritic, i worsened with deep breath. She lives with daughters  INTERVAL HISTORY Tamara Parrish is a 62 y.o. female who has above history reviewed by me today presents for assessment for tolerability after cycle 2 chemotherapy treatment for stage IV non-small cell lung cancer.  #She has started on palliative RT. Last treatment is 07/18/2018.  #Fatigue: reports worsening fatigue. Chronic onset, perisistent, no aggravating or improving factors, no associated symptoms.  # left posterior pain, improved.  # Reports finger joint pain due to rheumatoid arthritis on plaquenil.  # Anxiety, improved with Remeron 7.5mg  QHS.     Review of Systems  Constitutional: Positive for malaise/fatigue. Negative for chills and fever.  HENT: Negative for nosebleeds and sore throat.   Eyes: Negative for double vision, photophobia and redness.  Respiratory: Negative for cough, shortness of breath and wheezing.           Cardiovascular: Negative for chest pain, palpitations and orthopnea.  Gastrointestinal: Negative for abdominal pain, blood in stool, nausea and vomiting.  Genitourinary: Negative for dysuria.  Musculoskeletal: Negative for back pain, myalgias and neck pain.  Skin: Negative for itching and rash.  Neurological: Negative for dizziness, tingling and tremors.  Endo/Heme/Allergies: Negative  for  environmental allergies. Does not bruise/bleed easily.  Psychiatric/Behavioral: Negative for depression. The patient is nervous/anxious. The patient does not have insomnia.     MEDICAL HISTORY:  Past Medical History:  Diagnosis Date  . DM2 (diabetes mellitus, type 2) ([place])   . Dyspnea   . Hyperlipemia   . Hypertension   . Osteomyelitis ([place])   . Pneumonia   . RA (rheumatoid arthritis) ([place])   . Rheumatoid arthritis ([place])     SURGICAL HISTORY: Past Surgical History:  Procedure Laterality Date  . ENDOBRONCHIAL ULTRASOUND N/A 05/13/2018   Procedure: ENDOBRONCHIAL ULTRASOUND;  Surgeon: [name], MD;  Location: [facility] ORS;  Service: Pulmonary;  Laterality: N/A;  . PORTA CATH INSERTION N/A 05/27/2018   Procedure: PORTA CATH INSERTION;  Surgeon: Katha Cabal, MD;  Location: [facility];  Service: Cardiovascular;  Laterality: N/A;  . TOE SURGERY      SOCIAL HISTORY: Social History   Socioeconomic History  . Marital status: Single    Spouse name: Not on file  . Number of children: 2  . Years of education: Not on file  . Highest education level: Not on file  Occupational History  . Not on file  Social Needs  . Financial resource strain: Not very hard  . Food insecurity:    Worry: Never true    Inability: Never true  . Transportation needs:    Medical: No    Non-medical: No  Tobacco Use  . Smoking status: Former Smoker    Packs/day: 1.00    Years: 40.00    Pack years: 40.00    Types: Cigarettes    Last attempt to quit: 11/22/2017    Years since quitting: 0.6  . Smokeless tobacco: Never Used  . Tobacco comment: quit 2 weeks ago  Substance and Sexual Activity  . Alcohol use: Never    Frequency: Never  . Drug use: Never  . Sexual activity: Not on file  Lifestyle  . Physical activity:    Days per week: 0 days    Minutes per session: Not on file  . Stress: To some extent  Relationships  . Social connections:    Talks on phone: Three times  a week    Gets together: More than three times a week    Attends religious service: Not on file    Active member of club or organization: Not on file    Attends meetings of clubs or organizations: Not on file    Relationship status: Not on file  . Intimate partner violence:    Fear of current or ex partner: No    Emotionally abused: No    Physically abused: No    Forced sexual activity: No  Other Topics Concern  . Not on file  Social History Narrative   Living at home with daughter.  Ambulates with a walker at baseline.    FAMILY HISTORY: Family History  Problem Relation Age of Onset  . Diabetes Mother   . Lung cancer Father     ALLERGIES:  is allergic to ibuprofen and penicillins.  MEDICATIONS:  Current Outpatient Medications  Medication Sig Dispense Refill  . albuterol (PROVENTIL HFA;VENTOLIN HFA) 108 (90 Base) MCG/ACT inhaler Inhale 2 puffs into the lungs every 6 (six) hours as needed for wheezing or shortness of breath. 1 Inhaler 2  . ALPRAZolam (XANAX) 0.25 MG tablet Take 1 tablet (0.25 mg total) by mouth 2 (two) times daily as needed for anxiety. 30 tablet 0  . amLODipine (NORVASC)  5 MG tablet Take 5 mg by mouth daily.  3  . aspirin 81 MG chewable tablet Chew 1 tablet by mouth daily.    Marland Kitchen atorvastatin (LIPITOR) 40 MG tablet Take 40 mg by mouth daily.  3  . dexamethasone (DECADRON) 4 MG tablet Take 2 tablets (8 mg total) by mouth daily. Start the day after chemotherapy for 2 days. 30 tablet 1  . ferrous sulfate 325 (65 FE) MG EC tablet Take 1 tablet (325 mg total) by mouth 2 (two) times daily with a meal. 60 tablet 3  . folic acid (FOLVITE) 1 MG tablet Take 1 mg by mouth daily.  11  . hydroxychloroquine (PLAQUENIL) 200 MG tablet 1 tab twice a day, 90 days    . Insulin Glargine (BASAGLAR KWIKPEN) 100 UNIT/ML SOPN Inject 38 Units into the skin every morning.   12  . lidocaine-prilocaine (EMLA) cream Apply to affected area once 30 g 3  . lisinopril (PRINIVIL,ZESTRIL) 40 MG  tablet Take 40 mg by mouth daily.  3  . metFORMIN (GLUCOPHAGE) 1000 MG tablet Take 1,000 mg by mouth 2 (two) times daily.  5  . metoprolol tartrate (LOPRESSOR) 25 MG tablet Take 25 mg by mouth 2 (two) times daily.  3  . mirtazapine (REMERON) 7.5 MG tablet Take 1 tablet (7.5 mg total) by mouth at bedtime. 30 tablet 0  . ondansetron (ZOFRAN) 8 MG tablet Take 1 tablet (8 mg total) by mouth 2 (two) times daily as needed for refractory nausea / vomiting. Start on day 3 after chemo. 30 tablet 1  . ondansetron (ZOFRAN-ODT) 4 MG disintegrating tablet Take 4 mg by mouth every 8 (eight) hours as needed for nausea.     Marland Kitchen oxyCODONE (OXY IR/ROXICODONE) 5 MG immediate release tablet Take 1 tablet (5 mg total) by mouth every 6 (six) hours as needed for severe pain. 60 tablet 0  . prochlorperazine (COMPAZINE) 10 MG tablet Take 1 tablet (10 mg total) by mouth every 6 (six) hours as needed (Nausea or vomiting). 30 tablet 1  . traMADol (ULTRAM) 50 MG tablet Take 1 tablet (50 mg total) by mouth every 6 (six) hours as needed. 30 tablet 0  . triamterene-hydrochlorothiazide (DYAZIDE) 37.5-25 MG capsule Take 1 capsule by mouth daily.  3  . guaiFENesin-dextromethorphan (ROBITUSSIN DM) 100-10 MG/5ML syrup Take 5 mLs by mouth every 4 (four) hours as needed for cough. (Patient not taking: Reported on 06/21/2018) 118 mL 0  . LORazepam (ATIVAN) 1 MG tablet Take with you to MRI scan. (Patient not taking: Reported on 06/21/2018) 1 tablet 0   No current facility-administered medications for this visit.      PHYSICAL EXAMINATION: ECOG PERFORMANCE STATUS: 1 - Symptomatic but completely ambulatory Vitals:   06/29/18 1316  BP: (!) 146/81  Pulse: 83  Resp: 18  Temp: (!) 96.5 F (35.8 C)  SpO2: 98%   Filed Weights   06/29/18 1316  Weight: 183 lb 6.4 oz (83.2 kg)    Physical Exam  Constitutional: She is oriented to person, place, and time. No distress.  HENT:  Head: Normocephalic and atraumatic.  Mouth/Throat:  Oropharynx is clear and moist.  Eyes: Pupils are equal, round, and reactive to light. EOM are normal. No scleral icterus.  Neck: Normal range of motion. Neck supple.  Cardiovascular: Normal rate, regular rhythm and normal heart sounds.  Pulmonary/Chest: Effort normal. No respiratory distress. She has no wheezes. She exhibits no tenderness.  Decreased breath sounds bilaterally  Abdominal: Soft. Bowel sounds are normal.  She exhibits no distension and no mass. There is no tenderness.  Musculoskeletal: Normal range of motion. She exhibits no edema or deformity.  Neurological: She is alert and oriented to person, place, and time. No cranial nerve deficit. Coordination normal.  Skin: Skin is warm and dry. No rash noted. No erythema.  Psychiatric: She has a normal mood and affect. Her behavior is normal. Thought content normal.     LABORATORY DATA:  I have reviewed the data as listed Lab Results  Component Value Date   WBC 6.3 06/29/2018   HGB 8.2 (L) 06/29/2018   HCT 26.7 (L) 06/29/2018   MCV 89.6 06/29/2018   PLT 388 06/29/2018   Recent Labs    06/07/18 0926 06/21/18 0825 06/29/18 1248  NA 139 140 131*  K 4.2 4.2 5.0  CL 109 107 108  CO2 22 26 19*  GLUCOSE 143* 138* 137*  BUN 24* 21 37*  CREATININE 0.87 1.13* 1.16*  CALCIUM 8.8* 9.1 9.3  GFRNONAA >60 50* 49*  GFRAA >60 58* 56*  PROT 6.8 7.5 7.3  ALBUMIN 2.7* 2.8* 3.0*  AST 17 16 14*  ALT 8 7 9   ALKPHOS 104 92 74  BILITOT 0.4 0.3 0.3   Iron/TIBC/Ferritin/ %Sat    Component Value Date/Time   IRON 27 (L) 05/25/2018 1207   TIBC 241 (L) 05/25/2018 1207   FERRITIN 181 05/25/2018 1207   IRONPCTSAT 11 05/25/2018 1207   RADIOGRAPHIC STUDIES: I have personally reviewed the radiological images as listed and agreed with the findings in the report.  05/24/2018 PET scan  1. Highly hypermetabolic left upper lobe mass, maximum SUV 16.9, with chest wall invasion of the left third and fourth ribs and into the third intercostal  space. 2. Separate pleural tumor deposit on the left at the T3-4 level medially near the neural foramen. 3. Hypermetabolic AP window lymph nodes. Probable left hilar lymph nodes confluent with the mass. Separate tumor nodules are noted in the left upper lobe. 4. Hypermetabolic sacrococcygeal junction without a well-defined lesion, probably from a fracture, less likely from early metastatic disease.   ASSESSMENT & PLAN:  1. Malignant neoplasm of lung, unspecified laterality, unspecified part of lung ([place])   2. Rheumatoid arthritis, involving unspecified site, unspecified rheumatoid factor presence ([place])   3. Anemia, unspecified type   4. 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  [id number] disease at least, if sacrococcygeal junction turn out to be involved, then she has M1b disease.  Tolerates chemotherapy well.  Counts stable.  G-CSF being hold due to RT.   # RA off methotrexate, on plaquenil. Symptoms are not well controlled. Suggests patient to use oxycodone 5mg  Q6h PRN.  # Anxiety, continue Xanax 0.25mg  as needed. Recommend continue Remeron 7.5mg .   #Anemia, multifactorial.  Chronic inflammation plus minus low iron store was decreased iron saturation.  Hemoglobin worsened, likely due to chemotherapy.   We spent sufficient time to discuss many aspect of care, questions were answered to patient's satisfaction.  Return of visit: 1 week   Earlie Server, MD, PhD Hematology Oncology [facility] Pager- [phone] 06/30/2018

## 2018-07-01 ENCOUNTER — Ambulatory Visit
Admission: RE | Admit: 2018-07-01 | Discharge: 2018-07-01 | Disposition: A | Discharge status: Home / Self Care | Payer: BLUE CROSS/BLUE SHIELD | Source: Ambulatory Visit | Attending: Radiation Oncology | Admitting: Radiation Oncology

## 2018-07-01 DIAGNOSIS — C3412 Malignant neoplasm of upper lobe, left bronchus or lung: Secondary | ICD-10-CM | POA: Diagnosis not present

## 2018-07-03 ENCOUNTER — Ambulatory Visit
Admission: RE | Admit: 2018-07-03 | Discharge: 2018-07-03 | Disposition: A | Discharge status: Home / Self Care | Payer: BLUE CROSS/BLUE SHIELD | Source: Ambulatory Visit | Attending: Oncology | Admitting: Oncology

## 2018-07-03 DIAGNOSIS — C3490 Malignant neoplasm of unspecified part of unspecified bronchus or lung: Secondary | ICD-10-CM

## 2018-07-03 MED ORDER — GADOBENATE DIMEGLUMINE 529 MG/ML IV SOLN
17.0000 mL | Freq: Once | INTRAVENOUS | Status: AC | PRN
  Administered 2018-07-03: 17 mL via INTRAVENOUS

## 2018-07-04 ENCOUNTER — Ambulatory Visit
Admission: RE | Admit: 2018-07-04 | Discharge: 2018-07-04 | Disposition: A | Discharge status: Home / Self Care | Payer: BLUE CROSS/BLUE SHIELD | Source: Ambulatory Visit | Attending: Radiation Oncology | Admitting: Radiation Oncology

## 2018-07-04 DIAGNOSIS — C3412 Malignant neoplasm of upper lobe, left bronchus or lung: Secondary | ICD-10-CM | POA: Diagnosis not present

## 2018-07-05 ENCOUNTER — Ambulatory Visit
Admission: RE | Admit: 2018-07-05 | Discharge: 2018-07-05 | Disposition: A | Discharge status: Home / Self Care | Payer: BLUE CROSS/BLUE SHIELD | Source: Ambulatory Visit | Attending: Radiation Oncology | Admitting: Radiation Oncology

## 2018-07-05 DIAGNOSIS — C3412 Malignant neoplasm of upper lobe, left bronchus or lung: Secondary | ICD-10-CM | POA: Diagnosis not present

## 2018-07-06 ENCOUNTER — Ambulatory Visit
Admission: RE | Admit: 2018-07-06 | Discharge: 2018-07-06 | Disposition: A | Discharge status: Home / Self Care | Payer: BLUE CROSS/BLUE SHIELD | Source: Ambulatory Visit | Attending: Radiation Oncology | Admitting: Radiation Oncology

## 2018-07-06 ENCOUNTER — Inpatient Hospital Stay: Payer: BLUE CROSS/BLUE SHIELD

## 2018-07-06 DIAGNOSIS — C3490 Malignant neoplasm of unspecified part of unspecified bronchus or lung: Secondary | ICD-10-CM

## 2018-07-06 DIAGNOSIS — Z5111 Encounter for antineoplastic chemotherapy: Secondary | ICD-10-CM | POA: Diagnosis not present

## 2018-07-06 DIAGNOSIS — C3412 Malignant neoplasm of upper lobe, left bronchus or lung: Secondary | ICD-10-CM | POA: Diagnosis not present

## 2018-07-06 LAB — CBC WITH DIFFERENTIAL/PLATELET
ABS IMMATURE GRANULOCYTES: 0.08 10*3/uL — AB (ref 0.00–0.07)
BASOS PCT: 1 %
Basophils Absolute: 0.1 10*3/uL (ref 0.0–0.1)
Eosinophils Absolute: 0.3 10*3/uL (ref 0.0–0.5)
Eosinophils Relative: 7 %
HCT: 28.6 % — ABNORMAL LOW (ref 36.0–46.0)
HEMOGLOBIN: 8.7 g/dL — AB (ref 12.0–15.0)
Immature Granulocytes: 2 %
Lymphocytes Relative: 27 %
Lymphs Abs: 1.3 10*3/uL (ref 0.7–4.0)
MCH: 27.8 pg (ref 26.0–34.0)
MCHC: 30.4 g/dL (ref 30.0–36.0)
MCV: 91.4 fL (ref 80.0–100.0)
Monocytes Absolute: 0.8 10*3/uL (ref 0.1–1.0)
Monocytes Relative: 16 %
NEUTROS ABS: 2.3 10*3/uL (ref 1.7–7.7)
NRBC: 0 % (ref 0.0–0.2)
Neutrophils Relative %: 47 %
PLATELETS: 342 10*3/uL (ref 150–400)
RBC: 3.13 MIL/uL — AB (ref 3.87–5.11)
RDW: 15.6 % — ABNORMAL HIGH (ref 11.5–15.5)
WBC: 4.8 10*3/uL (ref 4.0–10.5)

## 2018-07-06 LAB — COMPREHENSIVE METABOLIC PANEL
ALBUMIN: 3.2 g/dL — AB (ref 3.5–5.0)
ALK PHOS: 84 U/L (ref 38–126)
ALT: 10 U/L (ref 0–44)
ANION GAP: 6 (ref 5–15)
AST: 14 U/L — ABNORMAL LOW (ref 15–41)
BILIRUBIN TOTAL: 0.2 mg/dL — AB (ref 0.3–1.2)
BUN: 15 mg/dL (ref 8–23)
CALCIUM: 9.2 mg/dL (ref 8.9–10.3)
CO2: 24 mmol/L (ref 22–32)
Chloride: 107 mmol/L (ref 98–111)
Creatinine, Ser: 0.94 mg/dL (ref 0.44–1.00)
GFR calc Af Amer: >60 mL/min (ref >60)
GFR calc non Af Amer: >60 mL/min (ref >60)
Glucose, Bld: 164 mg/dL — ABNORMAL HIGH (ref 70–99)
POTASSIUM: 4.7 mmol/L (ref 3.5–5.1)
Sodium: 137 mmol/L (ref 135–145)
TOTAL PROTEIN: 7.6 g/dL (ref 6.5–8.1)

## 2018-07-07 ENCOUNTER — Ambulatory Visit
Admission: RE | Admit: 2018-07-07 | Discharge: 2018-07-07 | Disposition: A | Discharge status: Home / Self Care | Payer: BLUE CROSS/BLUE SHIELD | Source: Ambulatory Visit | Attending: Radiation Oncology | Admitting: Radiation Oncology

## 2018-07-07 DIAGNOSIS — C3412 Malignant neoplasm of upper lobe, left bronchus or lung: Secondary | ICD-10-CM | POA: Diagnosis not present

## 2018-07-08 ENCOUNTER — Ambulatory Visit
Admission: RE | Admit: 2018-07-08 | Discharge: 2018-07-08 | Disposition: A | Discharge status: Home / Self Care | Payer: BLUE CROSS/BLUE SHIELD | Source: Ambulatory Visit | Attending: Radiation Oncology | Admitting: Radiation Oncology

## 2018-07-08 DIAGNOSIS — C3412 Malignant neoplasm of upper lobe, left bronchus or lung: Secondary | ICD-10-CM | POA: Diagnosis not present

## 2018-07-11 ENCOUNTER — Ambulatory Visit
Admission: RE | Admit: 2018-07-11 | Discharge: 2018-07-11 | Disposition: A | Discharge status: Home / Self Care | Payer: BLUE CROSS/BLUE SHIELD | Source: Ambulatory Visit | Attending: Radiation Oncology | Admitting: Radiation Oncology

## 2018-07-11 DIAGNOSIS — C3412 Malignant neoplasm of upper lobe, left bronchus or lung: Secondary | ICD-10-CM | POA: Diagnosis not present

## 2018-07-12 ENCOUNTER — Inpatient Hospital Stay: Payer: BLUE CROSS/BLUE SHIELD

## 2018-07-12 ENCOUNTER — Ambulatory Visit
Admission: RE | Admit: 2018-07-12 | Discharge: 2018-07-12 | Disposition: A | Discharge status: Home / Self Care | Payer: BLUE CROSS/BLUE SHIELD | Source: Ambulatory Visit | Attending: Radiation Oncology | Admitting: Radiation Oncology

## 2018-07-12 ENCOUNTER — Encounter: Payer: Self-pay | Admitting: Nurse Practitioner

## 2018-07-12 ENCOUNTER — Other Ambulatory Visit: Payer: Self-pay

## 2018-07-12 ENCOUNTER — Inpatient Hospital Stay (HOSPITAL_BASED_OUTPATIENT_CLINIC_OR_DEPARTMENT_OTHER): Payer: BLUE CROSS/BLUE SHIELD | Admitting: Oncology

## 2018-07-12 VITALS — BP 131/77 | HR 73 | Temp 96.3°F | Resp 18 | Wt 185.6 lb

## 2018-07-12 DIAGNOSIS — D649 Anemia, unspecified: Secondary | ICD-10-CM

## 2018-07-12 DIAGNOSIS — C3412 Malignant neoplasm of upper lobe, left bronchus or lung: Secondary | ICD-10-CM

## 2018-07-12 DIAGNOSIS — G620 Drug-induced polyneuropathy: Secondary | ICD-10-CM | POA: Diagnosis not present

## 2018-07-12 DIAGNOSIS — D473 Essential (hemorrhagic) thrombocythemia: Secondary | ICD-10-CM | POA: Insufficient documentation

## 2018-07-12 DIAGNOSIS — C3490 Malignant neoplasm of unspecified part of unspecified bronchus or lung: Secondary | ICD-10-CM

## 2018-07-12 DIAGNOSIS — D75839 Thrombocytosis, unspecified: Secondary | ICD-10-CM | POA: Insufficient documentation

## 2018-07-12 DIAGNOSIS — Z7189 Other specified counseling: Secondary | ICD-10-CM

## 2018-07-12 DIAGNOSIS — Z5111 Encounter for antineoplastic chemotherapy: Secondary | ICD-10-CM | POA: Insufficient documentation

## 2018-07-12 DIAGNOSIS — D6481 Anemia due to antineoplastic chemotherapy: Secondary | ICD-10-CM

## 2018-07-12 DIAGNOSIS — M069 Rheumatoid arthritis, unspecified: Secondary | ICD-10-CM | POA: Diagnosis not present

## 2018-07-12 DIAGNOSIS — F419 Anxiety disorder, unspecified: Secondary | ICD-10-CM

## 2018-07-12 DIAGNOSIS — Z87891 Personal history of nicotine dependence: Secondary | ICD-10-CM

## 2018-07-12 LAB — CBC WITH DIFFERENTIAL/PLATELET
Abs Immature Granulocytes: 0.09 10*3/uL — ABNORMAL HIGH (ref 0.00–0.07)
BASOS ABS: 0.1 10*3/uL (ref 0.0–0.1)
BASOS PCT: 1 %
EOS ABS: 0.3 10*3/uL (ref 0.0–0.5)
Eosinophils Relative: 3 %
HEMATOCRIT: 28.7 % — AB (ref 36.0–46.0)
Hemoglobin: 8.8 g/dL — ABNORMAL LOW (ref 12.0–15.0)
Immature Granulocytes: 1 %
LYMPHS ABS: 1.3 10*3/uL (ref 0.7–4.0)
Lymphocytes Relative: 14 %
MCH: 28 pg (ref 26.0–34.0)
MCHC: 30.7 g/dL (ref 30.0–36.0)
MCV: 91.4 fL (ref 80.0–100.0)
Monocytes Absolute: 0.8 10*3/uL (ref 0.1–1.0)
Monocytes Relative: 9 %
NEUTROS PCT: 72 %
NRBC: 0 % (ref 0.0–0.2)
Neutro Abs: 6.8 10*3/uL (ref 1.7–7.7)
PLATELETS: 288 10*3/uL (ref 150–400)
RBC: 3.14 MIL/uL — AB (ref 3.87–5.11)
RDW: 15.9 % — AB (ref 11.5–15.5)
WBC: 9.5 10*3/uL (ref 4.0–10.5)

## 2018-07-12 LAB — COMPREHENSIVE METABOLIC PANEL
ALK PHOS: 82 U/L (ref 38–126)
ALT: 10 U/L (ref 0–44)
ANION GAP: 6 (ref 5–15)
AST: 17 U/L (ref 15–41)
Albumin: 3.1 g/dL — ABNORMAL LOW (ref 3.5–5.0)
BUN: 16 mg/dL (ref 8–23)
CALCIUM: 9.2 mg/dL (ref 8.9–10.3)
CO2: 23 mmol/L (ref 22–32)
Chloride: 110 mmol/L (ref 98–111)
Creatinine, Ser: 0.89 mg/dL (ref 0.44–1.00)
GFR calc non Af Amer: >60 mL/min (ref >60)
Glucose, Bld: 109 mg/dL — ABNORMAL HIGH (ref 70–99)
Potassium: 5 mmol/L (ref 3.5–5.1)
SODIUM: 139 mmol/L (ref 135–145)
Total Bilirubin: 0.3 mg/dL (ref 0.3–1.2)
Total Protein: 7.4 g/dL (ref 6.5–8.1)

## 2018-07-12 MED ORDER — SODIUM CHLORIDE 0.9 % IV SOLN
20.0000 mg | Freq: Once | INTRAVENOUS | Status: AC
  Administered 2018-07-12: 20 mg via INTRAVENOUS
  Filled 2018-07-12: qty 2

## 2018-07-12 MED ORDER — HEPARIN SOD (PORK) LOCK FLUSH 100 UNIT/ML IV SOLN
500.0000 [IU] | Freq: Once | INTRAVENOUS | Status: AC | PRN
  Administered 2018-07-12: 500 [IU]
  Filled 2018-07-12: qty 5

## 2018-07-12 MED ORDER — OMEPRAZOLE 20 MG PO CPDR: 20.0000 mg | DELAYED_RELEASE_CAPSULE | Freq: Every day | ORAL | 0 refills | Status: AC

## 2018-07-12 MED ORDER — SODIUM CHLORIDE 0.9 % IV SOLN
135.0000 mg/m2 | Freq: Once | INTRAVENOUS | Status: AC
  Administered 2018-07-12: 264 mg via INTRAVENOUS
  Filled 2018-07-12: qty 44

## 2018-07-12 MED ORDER — SODIUM CHLORIDE 0.9 % IV SOLN
Freq: Once | INTRAVENOUS | Status: AC
  Administered 2018-07-12: 12:00:00 via INTRAVENOUS
  Filled 2018-07-12: qty 50

## 2018-07-12 MED ORDER — SODIUM CHLORIDE 0.9 % IV SOLN
Freq: Once | INTRAVENOUS | Status: AC
  Administered 2018-07-12: 11:00:00 via INTRAVENOUS
  Filled 2018-07-12: qty 250

## 2018-07-12 MED ORDER — DIPHENHYDRAMINE HCL 50 MG/ML IJ SOLN
50.0000 mg | Freq: Once | INTRAMUSCULAR | Status: AC
  Administered 2018-07-12: 50 mg via INTRAVENOUS
  Filled 2018-07-12: qty 1

## 2018-07-12 MED ORDER — PALONOSETRON HCL INJECTION 0.25 MG/5ML
0.2500 mg | Freq: Once | INTRAVENOUS | Status: AC
  Administered 2018-07-12: 0.25 mg via INTRAVENOUS
  Filled 2018-07-12: qty 5

## 2018-07-12 MED ORDER — SODIUM CHLORIDE 0.9 % IV SOLN
540.0000 mg | Freq: Once | INTRAVENOUS | Status: AC
  Administered 2018-07-12: 540 mg via INTRAVENOUS
  Filled 2018-07-12: qty 54

## 2018-07-12 MED ORDER — FAMOTIDINE IN NACL 20-0.9 MG/50ML-% IV SOLN: 20.0000 mg | Freq: Once | INTRAVENOUS | Status: DC

## 2018-07-12 NOTE — Progress Notes (Signed)
Patient here for follow up. Complains of heartburn and hiccups.

## 2018-07-12 NOTE — Progress Notes (Signed)
Hematology/Oncology follow up note [facility] Telephone:([phone]) [phone] Fax:([phone]) [phone]   Patient Care Team: Glendon Axe, MD as PCP - General (Internal Medicine) Telford Nab, RN as Registered Nurse  REFERRING PROVIDER: Dr. Felicie Morn REASON FOR VISIT Follow up for treatment of  lung cancer  HISTORY OF PRESENTING ILLNESS:  Tamara Parrish is a  62 y.o.  female with PMH listed below who was referred to me for evaluation of newly diagnosed lung cancer. Patient was admitted at the end of June and beginning of July for 2 times due to recurrent community-acquired pneumonia despite treating with antibiotics.  CT skin showed a left upper lobe consolidation suspicious malignancy enlarged bilateral hilar and mediastinal lymph nodes.  Patient was referred to see pulmonology Dr. Felicie Morn.   CT chest7/08/2017, in comparison with recent chest x-rays>>imaging personally reviewed, there are fibrotic changes of the lungs bibasilarly, greatest in the left, these are also seen in the lingula peripherally, right apex. There is masslike consolidation in the apical posterior segment of the left upper lobe. There is significant lymphadenopathy seen in the right hilar, subcarinal and right paratracheal area. There is a pulmonary arterial enlargement suggestive of pulmonary hypertension. Opacification of the left upper lobe appears advanced in comparison to previous chest x-ray 2 weeks prior 05/05/2018 CT chest,  with interval increase in size of large area of masslike consolidation within the left upper lobe which now appears to involve the adjacent chest wall as well as the left third and fourth ribs.  Mediastinal and hilar adenopathy, Patient underwent E bus bronchoscopy with E bus guided biopsy. Left upper lobe lavage was atypical cells consistent with squamous cell carcinoma. Left upper lobe brushing is positive for squamous cell carcinoma Subcarinal lymph node biopsy  negative for malignancy Right hilar lymph node biopsy negative for malignancy. Left upper lobe transbronchial biopsy positive for squamous cell carcinoma. Patient was referred to cancer center to establish cancer care with me. Today patient was accompanied by multiple family members including daughters and partner.  Denies any headache, shortness of breath, chest pain, abdominal pain, leg swelling.fever or chill.  Has history of rheumatoid arthritis follows up with Dr. Meda Coffee at [facility] Patient is on methotrexate  weekly, and  Remicade treatment. Reports persistent left posterior chest wall pain, pleuritic, i worsened with deep breath. 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   INTERVAL HISTORY Tamara Parrish is a 62 y.o. female who has above history reviewed by me today presents for assessment prior to cycle 3 chemotherapy for stage IV non-small cell lung cancer.  #She has started on palliative radiation.  Last radiation 07/18/2018.  Left posterior chest wall pain improved. #Reports chemotherapy tolerated well.  Denies nausea vomiting or diarrhea. #Chronic joint pain secondary to rheumatoid arthritis, on Plaquenil.  Symptoms stable.' #Anxiety, stable.  On Remeron 7.5 mg nightly. #Reports fingertip numbness and tingling, intermittent.  Worsened with cold temperature. # Heart burn, no aggravating or alleviating factors.  Not associated with stomach pain. Review of Systems  Constitutional: Positive for malaise/fatigue. Negative for chills, fever and weight loss.  HENT: Negative for nosebleeds and sore throat.   Eyes: Negative for double vision, photophobia and redness.  Respiratory: Negative for cough, shortness of breath and wheezing.           Cardiovascular: Negative for chest pain, palpitations and orthopnea.  Gastrointestinal: Negative for abdominal pain, blood in stool, nausea and vomiting.  Genitourinary: Negative for dysuria.  Musculoskeletal: Positive for joint pain. Negative for back pain, myalgias and neck pain.  Skin: Negative for itching and rash.  Neurological: Negative for dizziness, tingling and tremors.  Endo/Heme/Allergies: Negative for environmental allergies. Does not bruise/bleed easily.  Psychiatric/Behavioral: Negative for depression. The patient is not nervous/anxious and does not have insomnia.     MEDICAL HISTORY:  Past Medical History:  Diagnosis Date  . DM2 (diabetes mellitus, type 2) ([place])   . Dyspnea   . Hyperlipemia   . Hypertension   . Osteomyelitis ([place])   . Pneumonia   . RA (rheumatoid arthritis) ([place])   . Rheumatoid arthritis ([place])     SURGICAL HISTORY: Past Surgical History:  Procedure Laterality Date  . ENDOBRONCHIAL ULTRASOUND N/A 05/13/2018   Procedure: ENDOBRONCHIAL ULTRASOUND;  Surgeon: [name], MD;  Location: [facility] ORS;  Service: Pulmonary;  Laterality: N/A;  . PORTA CATH INSERTION N/A 05/27/2018   Procedure: PORTA CATH INSERTION;  Surgeon: Katha Cabal, MD;  Location: [facility];  Service: Cardiovascular;  Laterality: N/A;  . TOE SURGERY      SOCIAL HISTORY: Social History   Socioeconomic History  . Marital status: Single    Spouse name: Not on file  . Number of children: 2  . Years of education: Not on file  . Highest education level: Not on file  Occupational History  . Not on file  Social Needs  . Financial resource strain: Not very hard  . Food insecurity:    Worry: Never true    Inability: Never true  . Transportation needs:    Medical: No    Non-medical: No  Tobacco Use  . Smoking status: Former Smoker    Packs/day: 1.00    Years: 40.00    Pack years: 40.00    Types: Cigarettes    Last attempt to quit: 11/22/2017    Years since quitting: 0.6  . Smokeless tobacco: Never Used  . Tobacco comment: quit 2 weeks ago  Substance and Sexual Activity  . Alcohol use: Never     Frequency: Never  . Drug use: Never  . Sexual activity: Not on file  Lifestyle  . Physical activity:    Days per week: 0 days    Minutes per session: Not on file  . Stress: To some extent  Relationships  . Social connections:    Talks on phone: Three times a week    Gets together: More than three times a week    Attends religious service: Not on file    Active member of club or organization: Not on file    Attends meetings of clubs or organizations: Not on file    Relationship status: Not on file  . Intimate partner violence:    Fear of current or ex partner: No    Emotionally abused: No    Physically abused: No    Forced sexual activity: No  Other Topics Concern  . Not on file  Social History Narrative   Living at home with daughter.  Ambulates with a walker at baseline.    FAMILY HISTORY: Family History  Problem Relation Age of Onset  . Diabetes Mother   . Lung cancer Father     ALLERGIES:  is allergic to ibuprofen and penicillins.  MEDICATIONS:  Current Outpatient Medications  Medication Sig Dispense Refill  . albuterol (PROVENTIL HFA;VENTOLIN HFA) 108 (90 Base) MCG/ACT inhaler Inhale 2 puffs into the lungs every 6 (six) hours as needed for wheezing  or shortness of breath. 1 Inhaler 2  . ALPRAZolam (XANAX) 0.25 MG tablet Take 1 tablet (0.25 mg total) by mouth 2 (two) times daily as needed for anxiety. 30 tablet 0  . amLODipine (NORVASC) 5 MG tablet Take 5 mg by mouth daily.  3  . aspirin 81 MG chewable tablet Chew 1 tablet by mouth daily.    Marland Kitchen atorvastatin (LIPITOR) 40 MG tablet Take 40 mg by mouth daily.  3  . dexamethasone (DECADRON) 4 MG tablet Take 2 tablets (8 mg total) by mouth daily. Start the day after chemotherapy for 2 days. 30 tablet 1  . ferrous sulfate 325 (65 FE) MG EC tablet Take 1 tablet (325 mg total) by mouth 2 (two) times daily with a meal. 60 tablet 3  . folic acid (FOLVITE) 1 MG tablet Take 1 mg by mouth daily.  11  . hydroxychloroquine  (PLAQUENIL) 200 MG tablet 1 tab twice a day, 90 days    . Insulin Glargine (BASAGLAR KWIKPEN) 100 UNIT/ML SOPN Inject 38 Units into the skin every morning.   12  . lidocaine-prilocaine (EMLA) cream Apply to affected area once 30 g 3  . lisinopril (PRINIVIL,ZESTRIL) 40 MG tablet Take 40 mg by mouth daily.  3  . metFORMIN (GLUCOPHAGE) 1000 MG tablet Take 1,000 mg by mouth 2 (two) times daily.  5  . metoprolol tartrate (LOPRESSOR) 25 MG tablet Take 25 mg by mouth 2 (two) times daily.  3  . mirtazapine (REMERON) 7.5 MG tablet Take 1 tablet (7.5 mg total) by mouth at bedtime. 30 tablet 0  . ondansetron (ZOFRAN) 8 MG tablet Take 1 tablet (8 mg total) by mouth 2 (two) times daily as needed for refractory nausea / vomiting. Start on day 3 after chemo. 30 tablet 1  . ondansetron (ZOFRAN-ODT) 4 MG disintegrating tablet Take 4 mg by mouth every 8 (eight) hours as needed for nausea.     Marland Kitchen oxyCODONE (OXY IR/ROXICODONE) 5 MG immediate release tablet Take 1 tablet (5 mg total) by mouth every 6 (six) hours as needed for severe pain. 60 tablet 0  . prochlorperazine (COMPAZINE) 10 MG tablet Take 1 tablet (10 mg total) by mouth every 6 (six) hours as needed (Nausea or vomiting). 30 tablet 1  . traMADol (ULTRAM) 50 MG tablet Take 1 tablet (50 mg total) by mouth every 6 (six) hours as needed. 30 tablet 0  . triamterene-hydrochlorothiazide (DYAZIDE) 37.5-25 MG capsule Take 1 capsule by mouth daily.  3  . guaiFENesin-dextromethorphan (ROBITUSSIN DM) 100-10 MG/5ML syrup Take 5 mLs by mouth every 4 (four) hours as needed for cough. (Patient not taking: Reported on 06/21/2018) 118 mL 0  . LORazepam (ATIVAN) 1 MG tablet Take with you to MRI scan. (Patient not taking: Reported on 06/21/2018) 1 tablet 0  . omeprazole (PRILOSEC) 20 MG capsule Take 1 capsule (20 mg total) by mouth daily. 90 capsule 0   No current facility-administered medications for this visit.      PHYSICAL EXAMINATION: ECOG PERFORMANCE STATUS: 1 -  Symptomatic but completely ambulatory Vitals:   07/12/18 1004  BP: 131/77  Pulse: 73  Resp: 18  Temp: (!) 96.3 F (35.7 C)  SpO2: 99%   Filed Weights   07/12/18 1004  Weight: 185 lb 9.6 oz (84.2 kg)    Physical Exam  Constitutional: She is oriented to person, place, and time. No distress.  HENT:  Head: Normocephalic and atraumatic.  Mouth/Throat: Oropharynx is clear and moist.  Eyes: Pupils are equal, round,  and reactive to light. EOM are normal. No scleral icterus.  Neck: Normal range of motion. Neck supple.  Cardiovascular: Normal rate, regular rhythm and normal heart sounds.  Pulmonary/Chest: Effort normal. No respiratory distress. She has no wheezes. She exhibits no tenderness.  Decreased breath sounds bilaterally  Abdominal: Soft. Bowel sounds are normal. She exhibits no distension and no mass. There is no tenderness.  Musculoskeletal: Normal range of motion. She exhibits no edema or deformity.  Neurological: She is alert and oriented to person, place, and time. No cranial nerve deficit. Coordination normal.  Skin: Skin is warm and dry. No rash noted. No erythema.  Psychiatric: She has a normal mood and affect. Her behavior is normal. Thought content normal.     LABORATORY DATA:  I have reviewed the data as listed Lab Results  Component Value Date   WBC 9.5 07/12/2018   HGB 8.8 (L) 07/12/2018   HCT 28.7 (L) 07/12/2018   MCV 91.4 07/12/2018   PLT 288 07/12/2018   Recent Labs    06/29/18 1248 07/06/18 1452 07/12/18 0826  NA 131* 137 139  K 5.0 4.7 5.0  CL 108 107 110  CO2 19* 24 23  GLUCOSE 137* 164* 109*  BUN 37* 15 16  CREATININE 1.16* 0.94 0.89  CALCIUM 9.3 9.2 9.2  GFRNONAA 49* >60 >60  GFRAA 56* >60 >60  PROT 7.3 7.6 7.4  ALBUMIN 3.0* 3.2* 3.1*  AST 14* 14* 17  ALT '9 10 10  '$ ALKPHOS 74 84 82  BILITOT 0.3 0.2* 0.3   Iron/TIBC/Ferritin/ %Sat    Component Value Date/Time   IRON 27 (L) 05/25/2018 1207   TIBC 241 (L) 05/25/2018 1207   FERRITIN  181 05/25/2018 1207   IRONPCTSAT 11 05/25/2018 1207   RADIOGRAPHIC STUDIES: I have personally reviewed the radiological images as listed and agreed with the findings in the report.  05/24/2018 PET scan  1. Highly hypermetabolic left upper lobe mass, maximum SUV 16.9, with chest wall invasion of the left third and fourth ribs and into the third intercostal space. 2. Separate pleural tumor deposit on the left at the T3-4 level medially near the neural foramen. 3. Hypermetabolic AP window lymph nodes. Probable left hilar lymph nodes confluent with the mass. Separate tumor nodules are noted in the left upper lobe. 4. Hypermetabolic sacrococcygeal junction without a well-defined lesion, probably from a fracture, less likely from early metastatic disease.   ASSESSMENT & PLAN:  1. Malignant neoplasm of lung, unspecified laterality, unspecified part of lung ([place])   2. Rheumatoid arthritis, involving unspecified site, unspecified rheumatoid factor presence ([place])   3. Encounter for antineoplastic chemotherapy   4. Goals of care, counseling/discussion   5. 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  [id number] disease at least, if sacrococcygeal junction turn out to be involved, then she has M1b disease.  Overall tolerates chemotherapy well. Labs reviewed and discussed with patient.  Counts acceptable to proceed with cycle 3 chemotherapy with [place] and Taxol.  #Neuropathy, chemotherapy-induced.  Reduce Taxol to 135 mg/m. #Rheumatoid arthritis, off methotrexate.  On Plaquenil.  Symptoms are better controlled. She also uses oxycodone 5 mg every 6 hours as needed. #Anxiety, stable.  Continue Xanax 0.25 mg as instructed.  Continue Remeron 7.5 nightly. #Anemia, multifactorial.  Chemotherapy +/-low iron store. Continue to monitor. #Heartburn, advised patient to try omeprazole 20 mg daily.  Prescription faxed to pharmacy. we spent sufficient time to discuss many aspect of care,  questions  were answered to patient's satisfaction.   Return of visit: 3 weeks.   Earlie Server, MD, PhD Hematology Oncology [facility] at [facility] Pager- [phone] 07/12/2018

## 2018-07-13 ENCOUNTER — Ambulatory Visit
Admission: RE | Admit: 2018-07-13 | Discharge: 2018-07-13 | Disposition: A | Discharge status: Home / Self Care | Payer: BLUE CROSS/BLUE SHIELD | Source: Ambulatory Visit | Attending: Radiation Oncology | Admitting: Radiation Oncology

## 2018-07-13 DIAGNOSIS — C3412 Malignant neoplasm of upper lobe, left bronchus or lung: Secondary | ICD-10-CM | POA: Diagnosis not present

## 2018-07-14 ENCOUNTER — Other Ambulatory Visit: Payer: Self-pay | Admitting: *Deleted

## 2018-07-14 ENCOUNTER — Ambulatory Visit
Admission: RE | Admit: 2018-07-14 | Discharge: 2018-07-14 | Disposition: A | Discharge status: Home / Self Care | Payer: BLUE CROSS/BLUE SHIELD | Source: Ambulatory Visit | Attending: Radiation Oncology | Admitting: Radiation Oncology

## 2018-07-14 DIAGNOSIS — C3412 Malignant neoplasm of upper lobe, left bronchus or lung: Secondary | ICD-10-CM | POA: Diagnosis not present

## 2018-07-14 NOTE — Telephone Encounter (Signed)
Per Dr. Collie Siad request, called patient to discuss refill. Message left at home number for patient to return call. Will hold off on refill until discussed with patient.

## 2018-07-15 ENCOUNTER — Ambulatory Visit
Admission: RE | Admit: 2018-07-15 | Discharge: 2018-07-15 | Disposition: A | Discharge status: Home / Self Care | Payer: BLUE CROSS/BLUE SHIELD | Source: Ambulatory Visit | Attending: Radiation Oncology | Admitting: Radiation Oncology

## 2018-07-15 DIAGNOSIS — C3412 Malignant neoplasm of upper lobe, left bronchus or lung: Secondary | ICD-10-CM | POA: Diagnosis not present

## 2018-07-15 MED ORDER — OXYCODONE HCL 5 MG PO TABS: 5.0000 mg | ORAL_TABLET | Freq: Four times a day (QID) | ORAL | 0 refills | Status: DC | PRN

## 2018-07-15 NOTE — Telephone Encounter (Signed)
Tamara Parrish informed that prescription has been sent to pharmacy and that if she continues to need pain medicine on a regular basis, we need to see her to start her on a long acting pain medicine. She said they will discuss this at her next visit

## 2018-07-15 NOTE — Telephone Encounter (Signed)
Patient's daughter, Audelia Acton, called [place] requesting refill of Oxycodone. Per Dr. Collie Siad request, I phoned patient to clarify need for refill. Patient's daughter Audelia Acton returned call because patient is staying with her this week. [facility] sent email to daughter Theadora Rama to clarify refill request and she confirmed that patient does need refill.   As mandated by the [place] STOP Act (Strengthen Opioid Misuse Prevention), the [place] Controlled Substance Reporting System ([place]) was reviewed for this patient.  Below is the past 23-months of controlled substance prescriptions as displayed by the registry.  I have personally consulted with my supervising physician, Dr. Tasia Catchings, who agrees that continuation of opiate therapy is medically appropriate at this time and agrees to provide continual monitoring, including urine/blood drug screens, as indicated. Prescription sent electronically using [employer] secure transmission to requested pharmacy.   [place] Reviewed:     If patient continues to require medication every 4 hours as prescribed, we would like to see her sooner than scheduled follow-up to evaluate her pain and consider long-acting pain medication.    Beckey Rutter, DNP, AGNP-C [place] at [facility] [phone] (work cell) [phone] (office) 07/15/18 11:41 AM

## 2018-07-18 ENCOUNTER — Other Ambulatory Visit: Payer: Self-pay | Admitting: *Deleted

## 2018-07-18 ENCOUNTER — Ambulatory Visit
Admission: RE | Admit: 2018-07-18 | Discharge: 2018-07-18 | Disposition: A | Discharge status: Home / Self Care | Payer: BLUE CROSS/BLUE SHIELD | Source: Ambulatory Visit | Attending: Radiation Oncology | Admitting: Radiation Oncology

## 2018-07-18 DIAGNOSIS — C3412 Malignant neoplasm of upper lobe, left bronchus or lung: Secondary | ICD-10-CM | POA: Diagnosis not present

## 2018-07-18 MED ORDER — SUCRALFATE 1 G PO TABS: 1.0000 g | ORAL_TABLET | Freq: Three times a day (TID) | ORAL | 0 refills | Status: DC

## 2018-08-03 ENCOUNTER — Other Ambulatory Visit: Payer: Self-pay

## 2018-08-03 ENCOUNTER — Inpatient Hospital Stay: Payer: BLUE CROSS/BLUE SHIELD | Attending: Oncology

## 2018-08-03 ENCOUNTER — Inpatient Hospital Stay: Payer: BLUE CROSS/BLUE SHIELD

## 2018-08-03 ENCOUNTER — Inpatient Hospital Stay (HOSPITAL_BASED_OUTPATIENT_CLINIC_OR_DEPARTMENT_OTHER): Payer: BLUE CROSS/BLUE SHIELD | Admitting: Oncology

## 2018-08-03 ENCOUNTER — Encounter: Payer: Self-pay | Admitting: Oncology

## 2018-08-03 VITALS — BP 116/71 | HR 75 | Temp 96.0°F | Wt 183.8 lb

## 2018-08-03 DIAGNOSIS — Z87891 Personal history of nicotine dependence: Secondary | ICD-10-CM | POA: Insufficient documentation

## 2018-08-03 DIAGNOSIS — D649 Anemia, unspecified: Secondary | ICD-10-CM

## 2018-08-03 DIAGNOSIS — F419 Anxiety disorder, unspecified: Secondary | ICD-10-CM | POA: Diagnosis not present

## 2018-08-03 DIAGNOSIS — Z5111 Encounter for antineoplastic chemotherapy: Secondary | ICD-10-CM | POA: Diagnosis not present

## 2018-08-03 DIAGNOSIS — G620 Drug-induced polyneuropathy: Secondary | ICD-10-CM

## 2018-08-03 DIAGNOSIS — C3412 Malignant neoplasm of upper lobe, left bronchus or lung: Secondary | ICD-10-CM | POA: Diagnosis present

## 2018-08-03 DIAGNOSIS — D6481 Anemia due to antineoplastic chemotherapy: Secondary | ICD-10-CM | POA: Diagnosis not present

## 2018-08-03 DIAGNOSIS — C3490 Malignant neoplasm of unspecified part of unspecified bronchus or lung: Secondary | ICD-10-CM

## 2018-08-03 DIAGNOSIS — M069 Rheumatoid arthritis, unspecified: Secondary | ICD-10-CM | POA: Insufficient documentation

## 2018-08-03 LAB — COMPREHENSIVE METABOLIC PANEL
ALK PHOS: 84 U/L (ref 38–126)
ALT: 9 U/L (ref 0–44)
AST: 15 U/L (ref 15–41)
Albumin: 3.2 g/dL — ABNORMAL LOW (ref 3.5–5.0)
Anion gap: 6 (ref 5–15)
BILIRUBIN TOTAL: 0.4 mg/dL (ref 0.3–1.2)
BUN: 29 mg/dL — ABNORMAL HIGH (ref 8–23)
CALCIUM: 9.2 mg/dL (ref 8.9–10.3)
CO2: 21 mmol/L — ABNORMAL LOW (ref 22–32)
Chloride: 113 mmol/L — ABNORMAL HIGH (ref 98–111)
Creatinine, Ser: 1 mg/dL (ref 0.44–1.00)
GFR, EST NON AFRICAN AMERICAN: 59 mL/min — AB (ref >60)
Glucose, Bld: 90 mg/dL (ref 70–99)
Potassium: 4.5 mmol/L (ref 3.5–5.1)
Sodium: 140 mmol/L (ref 135–145)
TOTAL PROTEIN: 7.3 g/dL (ref 6.5–8.1)

## 2018-08-03 LAB — CBC WITH DIFFERENTIAL/PLATELET
ABS IMMATURE GRANULOCYTES: 0.03 10*3/uL (ref 0.00–0.07)
BASOS ABS: 0.1 10*3/uL (ref 0.0–0.1)
BASOS PCT: 1 %
EOS ABS: 0.1 10*3/uL (ref 0.0–0.5)
EOS PCT: 1 %
HEMATOCRIT: 28 % — AB (ref 36.0–46.0)
Hemoglobin: 8.7 g/dL — ABNORMAL LOW (ref 12.0–15.0)
Immature Granulocytes: 1 %
Lymphocytes Relative: 15 %
Lymphs Abs: 1 10*3/uL (ref 0.7–4.0)
MCH: 28.9 pg (ref 26.0–34.0)
MCHC: 31.1 g/dL (ref 30.0–36.0)
MCV: 93 fL (ref 80.0–100.0)
MONO ABS: 0.6 10*3/uL (ref 0.1–1.0)
MONOS PCT: 10 %
NRBC: 0 % (ref 0.0–0.2)
Neutro Abs: 4.7 10*3/uL (ref 1.7–7.7)
Neutrophils Relative %: 72 %
PLATELETS: 275 10*3/uL (ref 150–400)
RBC: 3.01 MIL/uL — ABNORMAL LOW (ref 3.87–5.11)
RDW: 17.1 % — ABNORMAL HIGH (ref 11.5–15.5)
WBC: 6.5 10*3/uL (ref 4.0–10.5)

## 2018-08-03 MED ORDER — HEPARIN SOD (PORK) LOCK FLUSH 100 UNIT/ML IV SOLN
500.0000 [IU] | Freq: Once | INTRAVENOUS | Status: AC
  Administered 2018-08-03: 500 [IU] via INTRAVENOUS
  Filled 2018-08-03: qty 5

## 2018-08-03 MED ORDER — SODIUM CHLORIDE 0.9 % IV SOLN
Freq: Once | INTRAVENOUS | Status: AC
  Administered 2018-08-03: 10:00:00 via INTRAVENOUS
  Filled 2018-08-03: qty 250

## 2018-08-03 MED ORDER — DIPHENHYDRAMINE HCL 50 MG/ML IJ SOLN
50.0000 mg | Freq: Once | INTRAMUSCULAR | Status: AC
  Administered 2018-08-03: 50 mg via INTRAVENOUS
  Filled 2018-08-03: qty 1

## 2018-08-03 MED ORDER — ALPRAZOLAM 0.25 MG PO TABS: 0.2500 mg | ORAL_TABLET | Freq: Every day | ORAL | 0 refills | Status: DC | PRN

## 2018-08-03 MED ORDER — SODIUM CHLORIDE 0.9 % IV SOLN
135.0000 mg/m2 | Freq: Once | INTRAVENOUS | Status: AC
  Administered 2018-08-03: 264 mg via INTRAVENOUS
  Filled 2018-08-03: qty 44

## 2018-08-03 MED ORDER — SODIUM CHLORIDE 0.9 % IV SOLN
540.0000 mg | Freq: Once | INTRAVENOUS | Status: AC
  Administered 2018-08-03: 540 mg via INTRAVENOUS
  Filled 2018-08-03: qty 54

## 2018-08-03 MED ORDER — PALONOSETRON HCL INJECTION 0.25 MG/5ML
0.2500 mg | Freq: Once | INTRAVENOUS | Status: AC
  Administered 2018-08-03: 0.25 mg via INTRAVENOUS
  Filled 2018-08-03: qty 5

## 2018-08-03 MED ORDER — FAMOTIDINE IN NACL 20-0.9 MG/50ML-% IV SOLN
20.0000 mg | Freq: Once | INTRAVENOUS | Status: AC
  Administered 2018-08-03: 20 mg via INTRAVENOUS
  Filled 2018-08-03: qty 50

## 2018-08-03 MED ORDER — SODIUM CHLORIDE 0.9 % IV SOLN
20.0000 mg | Freq: Once | INTRAVENOUS | Status: AC
  Administered 2018-08-03: 20 mg via INTRAVENOUS
  Filled 2018-08-03: qty 2

## 2018-08-03 MED ORDER — SODIUM CHLORIDE 0.9% FLUSH
10.0000 mL | INTRAVENOUS | Status: DC | PRN
  Administered 2018-08-03: 10 mL via INTRAVENOUS
  Filled 2018-08-03: qty 10

## 2018-08-03 MED ORDER — OXYCODONE HCL 10 MG PO TABS: 10.0000 mg | ORAL_TABLET | Freq: Three times a day (TID) | ORAL | 0 refills | Status: DC | PRN

## 2018-08-03 NOTE — Progress Notes (Signed)
Hematology/Oncology follow up note [facility] [phone] [phone]   Patient Care Team: Glendon Axe, MD as PCP - General (Internal Medicine) Telford Nab, RN as Registered Nurse  REFERRING PROVIDER: Dr. Felicie Morn REASON FOR VISIT Follow up for treatment of  lung cancer  HISTORY OF PRESENTING ILLNESS:  Tamara Parrish is a  62 y.o.  female with PMH listed below who was referred to me for evaluation of newly diagnosed lung cancer. Patient was admitted at the end of June and beginning of July for 2 times due to recurrent community-acquired pneumonia despite treating with antibiotics.  CT skin showed a left upper lobe consolidation suspicious malignancy enlarged bilateral hilar and mediastinal lymph nodes.  Patient was referred to see pulmonology Dr. Felicie Morn.   CT chest7/08/2017, in comparison with recent chest x-rays>>imaging personally reviewed, there are fibrotic changes of the lungs bibasilarly, greatest in the left, these are also seen in the lingula peripherally, right apex. There is masslike consolidation in the apical posterior segment of the left upper lobe. There is significant lymphadenopathy seen in the right hilar, subcarinal and right paratracheal area. There is a pulmonary arterial enlargement suggestive of pulmonary hypertension. Opacification of the left upper lobe appears advanced in comparison to previous chest x-ray 2 weeks prior 05/05/2018 CT chest,  with interval increase in size of large area of masslike consolidation within the left upper lobe which now appears to involve the adjacent chest wall as well as the left third and fourth ribs.  Mediastinal and hilar adenopathy, Patient underwent E bus bronchoscopy with E bus guided biopsy. Left upper lobe lavage was atypical cells consistent with squamous cell carcinoma. Left upper lobe brushing is positive for squamous cell carcinoma Subcarinal lymph node biopsy  negative for malignancy Right hilar lymph node biopsy negative for malignancy. Left upper lobe transbronchial biopsy positive for squamous cell carcinoma. Patient was referred to cancer center to establish cancer care with me. Today patient was accompanied by multiple family members including daughters and partner.  Denies any headache, shortness of breath, chest pain, abdominal pain, leg swelling.fever or chill.  Has history of rheumatoid arthritis follows up with Dr. Meda Coffee at [facility] clinic Patient is on methotrexate  weekly, and  Remicade treatment. Reports persistent left posterior chest wall pain, pleuritic, i worsened with deep breath. 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   INTERVAL HISTORY Tamara Parrish is a 62 y.o. female who has above history reviewed by me today presents for assessment prior to cycle 3 chemotherapy for stage IV non-small cell lung cancer. Status post palliative radiation 07/18/2018.  #Patient reports feeling nauseated and also had one episode of loose bowel movement this morning prior to coming to cancer center. #She still has anxiety, takes Xanax 0.25 once a day as needed.  Request refills.  Takes Remeron 7.5 mg nightly. #Continues to have chronic joint pains due to rheumatoid arthritis.  Reports that oxycodone has been helpful in relieving her generalized joint pains.  Request refill. #She has started on palliative radiation.  Last radiation 07/18/2018.  Left posterior chest wall pain improved. #Reports chemotherapy tolerated well.  Denies nausea vomiting or diarrhea. #Chronic joint pain secondary to rheumatoid arthritis, on Plaquenil.  Symptoms stable.'  #Neuropathy, continue to have intermittent fingertip numbness and tingling, stable.  Worsened with cold temperature.   # Heart burn, improved after taking omeprazole.  She is no longer symptomatic.  Stopped taking  omeprazole.  Review of Systems  Constitutional: Positive for fatigue. Negative for appetite change, chills and fever.  HENT:   Negative for hearing loss and voice change.   Eyes: Negative for eye problems.  Respiratory: Negative for chest tightness and cough.   Cardiovascular: Negative for chest pain.  Gastrointestinal: Negative for abdominal distention, abdominal pain and blood in stool.  Endocrine: Negative for hot flashes.  Genitourinary: Negative for difficulty urinating and frequency.   Musculoskeletal: Positive for arthralgias.  Skin: Negative for itching and rash.  Neurological: Negative for extremity weakness.  Hematological: Negative for adenopathy.  Psychiatric/Behavioral: Negative for confusion. The patient is nervous/anxious.     Marland Kitchen     MEDICAL HISTORY:  Past Medical History:  Diagnosis Date  . DM2 (diabetes mellitus, type 2) ([place])   . Dyspnea   . Hyperlipemia   . Hypertension   . Osteomyelitis ([place])   . Pneumonia   . RA (rheumatoid arthritis) ([place])   . Rheumatoid arthritis ([place])     SURGICAL HISTORY: Past Surgical History:  Procedure Laterality Date  . ENDOBRONCHIAL ULTRASOUND N/A 05/13/2018   Procedure: ENDOBRONCHIAL ULTRASOUND;  Surgeon: [name], MD;  Location: [facility] ORS;  Service: Pulmonary;  Laterality: N/A;  . PORTA CATH INSERTION N/A 05/27/2018   Procedure: PORTA CATH INSERTION;  Surgeon: Katha Cabal, MD;  Location: [place] [facility];  Service: Cardiovascular;  Laterality: N/A;  . TOE SURGERY      SOCIAL HISTORY: Social History   Socioeconomic History  . Marital status: Single    Spouse name: Not on file  . Number of children: 2  . Years of education: Not on file  . Highest education level: Not on file  Occupational History  . Not on file  Social Needs  . Financial resource strain: Not very hard  . Food insecurity:    Worry: Never true    Inability: Never true  . Transportation needs:    Medical: No    Non-medical:  No  Tobacco Use  . Smoking status: Former Smoker    Packs/day: 1.00    Years: 40.00    Pack years: 40.00    Types: Cigarettes    Last attempt to quit: 11/22/2017    Years since quitting: 0.6  . Smokeless tobacco: Never Used  . Tobacco comment: quit 2 weeks ago  Substance and Sexual Activity  . Alcohol use: Never    Frequency: Never  . Drug use: Never  . Sexual activity: Not on file  Lifestyle  . Physical activity:    Days per week: 0 days    Minutes per session: Not on file  . Stress: To some extent  Relationships  . Social connections:    Talks on phone: Three times a week    Gets together: More than three times a week    Attends religious service: Not on file    Active member of club or organization: Not on file    Attends meetings of clubs or organizations: Not on file    Relationship status: Not on file  . Intimate partner violence:    Fear of current or ex partner: No    Emotionally abused: No    Physically abused: No    Forced sexual activity: No  Other Topics Concern  . Not on file  Social History Narrative   Living at home with daughter.  Ambulates with a walker at baseline.    FAMILY HISTORY: Family History  Problem Relation Age of Onset  .  Diabetes Mother   . Lung cancer Father     ALLERGIES:  is allergic to ibuprofen and penicillins.  MEDICATIONS:  Current Outpatient Medications  Medication Sig Dispense Refill  . albuterol (PROVENTIL HFA;VENTOLIN HFA) 108 (90 Base) MCG/ACT inhaler Inhale 2 puffs into the lungs every 6 (six) hours as needed for wheezing or shortness of breath. 1 Inhaler 2  . ALPRAZolam (XANAX) 0.25 MG tablet Take 1 tablet (0.25 mg total) by mouth daily as needed for anxiety. 30 tablet 0  . amLODipine (NORVASC) 5 MG tablet Take 5 mg by mouth daily.  3  . aspirin 81 MG chewable tablet Chew 1 tablet by mouth daily.    Marland Kitchen atorvastatin (LIPITOR) 40 MG tablet Take 40 mg by mouth daily.  3  . dexamethasone (DECADRON) 4 MG tablet Take 2 tablets  (8 mg total) by mouth daily. Start the day after chemotherapy for 2 days. 30 tablet 1  . ferrous sulfate 325 (65 FE) MG EC tablet Take 1 tablet (325 mg total) by mouth 2 (two) times daily with a meal. 60 tablet 3  . folic acid (FOLVITE) 1 MG tablet Take 1 mg by mouth daily.  11  . hydroxychloroquine (PLAQUENIL) 200 MG tablet 1 tab twice a day, 90 days    . Insulin Glargine (BASAGLAR KWIKPEN) 100 UNIT/ML SOPN Inject 38 Units into the skin every morning.   12  . lidocaine-prilocaine (EMLA) cream Apply to affected area once 30 g 3  . lisinopril (PRINIVIL,ZESTRIL) 40 MG tablet Take 40 mg by mouth daily.  3  . metFORMIN (GLUCOPHAGE) 1000 MG tablet Take 1,000 mg by mouth 2 (two) times daily.  5  . metoprolol tartrate (LOPRESSOR) 25 MG tablet Take 25 mg by mouth 2 (two) times daily.  3  . mirtazapine (REMERON) 7.5 MG tablet Take 1 tablet (7.5 mg total) by mouth at bedtime. 30 tablet 0  . omeprazole (PRILOSEC) 20 MG capsule Take 1 capsule (20 mg total) by mouth daily. 90 capsule 0  . ondansetron (ZOFRAN) 8 MG tablet Take 1 tablet (8 mg total) by mouth 2 (two) times daily as needed for refractory nausea / vomiting. Start on day 3 after chemo. 30 tablet 1  . ondansetron (ZOFRAN-ODT) 4 MG disintegrating tablet Take 4 mg by mouth every 8 (eight) hours as needed for nausea.     . prochlorperazine (COMPAZINE) 10 MG tablet Take 1 tablet (10 mg total) by mouth every 6 (six) hours as needed (Nausea or vomiting). 30 tablet 1  . sucralfate (CARAFATE) 1 g tablet Take 1 tablet (1 g total) by mouth 3 (three) times daily. Dissolve in 3-4 tbsp warm water, swish and swallow 90 tablet 0  . traMADol (ULTRAM) 50 MG tablet Take 1 tablet (50 mg total) by mouth every 6 (six) hours as needed. 30 tablet 0  . triamterene-hydrochlorothiazide (DYAZIDE) 37.5-25 MG capsule Take 1 capsule by mouth daily.  3  . Oxycodone HCl 10 MG TABS Take 1 tablet (10 mg total) by mouth every 8 (eight) hours as needed. 60 tablet 0   No current  facility-administered medications for this visit.    Facility-Administered Medications Ordered in Other Visits  Medication Dose Route Frequency Provider Last Rate Last Dose  . CARBOplatin (PARAPLATIN) 540 mg in sodium chloride 0.9 % 250 mL chemo infusion  540 mg Intravenous Once Earlie Server, MD 608 mL/hr at 08/03/18 1423 540 mg at 08/03/18 1423  . heparin lock flush 100 unit/mL  500 Units Intravenous Once Earlie Server, MD      .  sodium chloride flush (NS) 0.9 % injection 10 mL  10 mL Intravenous PRN Earlie Server, MD   10 mL at 08/03/18 0846     PHYSICAL EXAMINATION: ECOG PERFORMANCE STATUS: 1 - Symptomatic but completely ambulatory Vitals:   08/03/18 0902  BP: 116/71  Pulse: 75  Temp: (!) 96 F (35.6 C)  SpO2: 99%   Filed Weights   08/03/18 0902  Weight: 183 lb 12.8 oz (83.4 kg)   Physical Exam  Constitutional: She is oriented to person, place, and time. No distress.  HENT:  Head: Normocephalic and atraumatic.  Mouth/Throat: Oropharynx is clear and moist.  Eyes: Pupils are equal, round, and reactive to light. EOM are normal. No scleral icterus.  Neck: Normal range of motion. Neck supple.  Cardiovascular: Normal rate, regular rhythm and normal heart sounds.  Pulmonary/Chest: Effort normal. No respiratory distress. She has no wheezes.  Abdominal: Soft. Bowel sounds are normal. She exhibits no distension and no mass. There is no tenderness.  Musculoskeletal: Normal range of motion. She exhibits no edema or deformity.  Neurological: She is alert and oriented to person, place, and time. No cranial nerve deficit. Coordination normal.  Skin: Skin is warm and dry. No rash noted. No erythema.  Psychiatric: She has a normal mood and affect. Her behavior is normal.       LABORATORY DATA:  I have reviewed the data as listed Lab Results  Component Value Date   WBC 6.5 08/03/2018   HGB 8.7 (L) 08/03/2018   HCT 28.0 (L) 08/03/2018   MCV 93.0 08/03/2018   PLT 275 08/03/2018   Recent Labs     07/06/18 1452 07/12/18 0826 08/03/18 0842  NA 137 139 140  K 4.7 5.0 4.5  CL 107 110 113*  CO2 24 23 21*  GLUCOSE 164* 109* 90  BUN 15 16 29*  CREATININE 0.94 0.89 1.00  CALCIUM 9.2 9.2 9.2  GFRNONAA >60 >60 59*  GFRAA >60 >60 >60  PROT 7.6 7.4 7.3  ALBUMIN 3.2* 3.1* 3.2*  AST 14* 17 15  ALT '10 10 9  '$ ALKPHOS 84 82 84  BILITOT 0.2* 0.3 0.4   Iron/TIBC/Ferritin/ %Sat    Component Value Date/Time   IRON 27 (L) 05/25/2018 1207   TIBC 241 (L) 05/25/2018 1207   FERRITIN 181 05/25/2018 1207   IRONPCTSAT 11 05/25/2018 1207   RADIOGRAPHIC STUDIES: I have personally reviewed the radiological images as listed and agreed with the findings in the report.  05/24/2018 PET scan  1. Highly hypermetabolic left upper lobe mass, maximum SUV 16.9, with chest wall invasion of the left third and fourth ribs and into the third intercostal space. 2. Separate pleural tumor deposit on the left at the T3-4 level medially near the neural foramen. 3. Hypermetabolic AP window lymph nodes. Probable left hilar lymph nodes confluent with the mass. Separate tumor nodules are noted in the left upper lobe. 4. Hypermetabolic sacrococcygeal junction without a well-defined lesion, probably from a fracture, less likely from early metastatic disease.   ASSESSMENT & PLAN:  1. Malignant neoplasm of lung, unspecified laterality, unspecified part of lung ([place])   2. Rheumatoid arthritis, involving unspecified site, unspecified rheumatoid factor presence ([place])   3. Encounter for antineoplastic chemotherapy   4. 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  Labs are reviewed and discussed with patient. Counts are acceptable to proceed with Cycle 4 Carboplatin and Taxol.  Immunotherapy was not offered due to baseline severe rheumatoid arthritis. Discussed with patient that I will obtain CT chest abdomen pelvis prior to next  cycle of treatment for assessment of treatment response.  #Neuropathy, chemotherapy-induced, grade 1.  Taxol was reduced to 135 mg/m.  Patient reports neuropathy slightly better.  #Rheumatoid arthritis, off methotrexate.  Continue Plaquenil.   She also uses oxycodone 5 mg every 6 hours as needed.  Refill her pain medication today.  #Anxiety, stable.  Continue Xanax 0.25 mg daily as needed.  Prescription refill sent to pharmacy.  Continue Remeron 7.5 nightly. #Anemia, multifactorial.  Chemotherapy +/-low iron store. Hemoglobin stable.  Continue monitor. We spent sufficient time to discuss many aspect of care, questions were answered to patient's satisfaction. Return of visit: 3 weeks.   Earlie Server, MD, PhD Hematology Oncology [facility] Pager- [phone] 08/03/2018

## 2018-08-03 NOTE — Progress Notes (Signed)
Patient here for follow up. Complains of being nauseated and having some diarrhea this morning. She is requesting refill on oxycodone and xanax.

## 2018-08-22 ENCOUNTER — Ambulatory Visit
Admission: RE | Admit: 2018-08-22 | Discharge: 2018-08-22 | Disposition: A | Discharge status: Home / Self Care | Payer: BLUE CROSS/BLUE SHIELD | Source: Ambulatory Visit | Attending: Oncology | Admitting: Oncology

## 2018-08-22 DIAGNOSIS — C3490 Malignant neoplasm of unspecified part of unspecified bronchus or lung: Secondary | ICD-10-CM | POA: Insufficient documentation

## 2018-08-22 HISTORY — DX: Malignant (primary) neoplasm, unspecified: C80.1

## 2018-08-22 HISTORY — DX: Systemic involvement of connective tissue, unspecified: M35.9

## 2018-08-22 MED ORDER — IOPAMIDOL (ISOVUE-300) INJECTION 61%
100.0000 mL | Freq: Once | INTRAVENOUS | Status: AC | PRN
  Administered 2018-08-22: 100 mL via INTRAVENOUS

## 2018-08-23 ENCOUNTER — Other Ambulatory Visit: Payer: Self-pay

## 2018-08-23 ENCOUNTER — Inpatient Hospital Stay: Payer: BLUE CROSS/BLUE SHIELD

## 2018-08-23 ENCOUNTER — Inpatient Hospital Stay (HOSPITAL_BASED_OUTPATIENT_CLINIC_OR_DEPARTMENT_OTHER): Payer: BLUE CROSS/BLUE SHIELD | Admitting: Oncology

## 2018-08-23 ENCOUNTER — Encounter: Payer: Self-pay | Admitting: Oncology

## 2018-08-23 VITALS — BP 145/80 | HR 80 | Temp 96.2°F | Resp 18 | Wt 190.0 lb

## 2018-08-23 DIAGNOSIS — G893 Neoplasm related pain (acute) (chronic): Secondary | ICD-10-CM | POA: Diagnosis not present

## 2018-08-23 DIAGNOSIS — C3412 Malignant neoplasm of upper lobe, left bronchus or lung: Secondary | ICD-10-CM

## 2018-08-23 DIAGNOSIS — D6481 Anemia due to antineoplastic chemotherapy: Secondary | ICD-10-CM

## 2018-08-23 DIAGNOSIS — F419 Anxiety disorder, unspecified: Secondary | ICD-10-CM

## 2018-08-23 DIAGNOSIS — Z87891 Personal history of nicotine dependence: Secondary | ICD-10-CM

## 2018-08-23 DIAGNOSIS — R110 Nausea: Secondary | ICD-10-CM

## 2018-08-23 DIAGNOSIS — G629 Polyneuropathy, unspecified: Secondary | ICD-10-CM

## 2018-08-23 DIAGNOSIS — M069 Rheumatoid arthritis, unspecified: Secondary | ICD-10-CM

## 2018-08-23 DIAGNOSIS — Z5111 Encounter for antineoplastic chemotherapy: Secondary | ICD-10-CM | POA: Diagnosis not present

## 2018-08-23 DIAGNOSIS — C3490 Malignant neoplasm of unspecified part of unspecified bronchus or lung: Secondary | ICD-10-CM

## 2018-08-23 LAB — COMPREHENSIVE METABOLIC PANEL
ALBUMIN: 3.4 g/dL — AB (ref 3.5–5.0)
ALT: 11 U/L (ref 0–44)
AST: 20 U/L (ref 15–41)
Alkaline Phosphatase: 88 U/L (ref 38–126)
Anion gap: 10 (ref 5–15)
BUN: 17 mg/dL (ref 8–23)
CO2: 23 mmol/L (ref 22–32)
Calcium: 8.8 mg/dL — ABNORMAL LOW (ref 8.9–10.3)
Chloride: 107 mmol/L (ref 98–111)
Creatinine, Ser: 1.13 mg/dL — ABNORMAL HIGH (ref 0.44–1.00)
GFR calc Af Amer: 59 mL/min — ABNORMAL LOW (ref >60)
GFR calc non Af Amer: 51 mL/min — ABNORMAL LOW (ref >60)
Glucose, Bld: 221 mg/dL — ABNORMAL HIGH (ref 70–99)
Potassium: 4.5 mmol/L (ref 3.5–5.1)
Sodium: 140 mmol/L (ref 135–145)
Total Bilirubin: 0.4 mg/dL (ref 0.3–1.2)
Total Protein: 7.5 g/dL (ref 6.5–8.1)

## 2018-08-23 LAB — CBC WITH DIFFERENTIAL/PLATELET
ABS IMMATURE GRANULOCYTES: 0.06 10*3/uL (ref 0.00–0.07)
BASOS PCT: 1 %
Basophils Absolute: 0 10*3/uL (ref 0.0–0.1)
Eosinophils Absolute: 0 10*3/uL (ref 0.0–0.5)
Eosinophils Relative: 0 %
HCT: 30.3 % — ABNORMAL LOW (ref 36.0–46.0)
Hemoglobin: 9.2 g/dL — ABNORMAL LOW (ref 12.0–15.0)
Immature Granulocytes: 1 %
Lymphocytes Relative: 12 %
Lymphs Abs: 0.8 10*3/uL (ref 0.7–4.0)
MCH: 28.9 pg (ref 26.0–34.0)
MCHC: 30.4 g/dL (ref 30.0–36.0)
MCV: 95.3 fL (ref 80.0–100.0)
Monocytes Absolute: 0.7 10*3/uL (ref 0.1–1.0)
Monocytes Relative: 10 %
NEUTROS ABS: 5.3 10*3/uL (ref 1.7–7.7)
Neutrophils Relative %: 76 %
Platelets: 310 10*3/uL (ref 150–400)
RBC: 3.18 MIL/uL — ABNORMAL LOW (ref 3.87–5.11)
RDW: 18 % — ABNORMAL HIGH (ref 11.5–15.5)
WBC: 7 10*3/uL (ref 4.0–10.5)
nRBC: 0.4 % — ABNORMAL HIGH (ref 0.0–0.2)

## 2018-08-23 MED ORDER — ALPRAZOLAM 0.25 MG PO TABS: 0.2500 mg | ORAL_TABLET | Freq: Every day | ORAL | 0 refills | Status: DC | PRN

## 2018-08-23 MED ORDER — LIDOCAINE-PRILOCAINE 2.5-2.5 % EX CREA: TOPICAL_CREAM | CUTANEOUS | 3 refills | Status: DC

## 2018-08-23 MED ORDER — ONDANSETRON HCL 8 MG PO TABS: 8.0000 mg | ORAL_TABLET | Freq: Two times a day (BID) | ORAL | 2 refills | Status: AC | PRN

## 2018-08-23 MED ORDER — OXYCODONE HCL 10 MG PO TABS: 10.0000 mg | ORAL_TABLET | Freq: Three times a day (TID) | ORAL | 0 refills | Status: DC | PRN

## 2018-08-23 MED ORDER — ALBUTEROL SULFATE HFA 108 (90 BASE) MCG/ACT IN AERS: 2.0000 | INHALATION_SPRAY | Freq: Four times a day (QID) | RESPIRATORY_TRACT | 2 refills | Status: AC | PRN

## 2018-08-23 MED ORDER — MIRTAZAPINE 7.5 MG PO TABS: 7.5000 mg | ORAL_TABLET | Freq: Every day | ORAL | 3 refills | Status: DC

## 2018-08-23 NOTE — Progress Notes (Signed)
Patient here for follow up. Complains of being nauseated every morning. She is complaining of back pain and she has been taking oxycodone more frequently. Pt requesting refill on ondansetron-odt, albuterol, xanax and oxycodone.

## 2018-08-23 NOTE — Progress Notes (Signed)
Hematology/Oncology follow up note [facility] [phone] [phone]   Patient Care Team: Glendon Axe, MD as PCP - General (Internal Medicine) Telford Nab, RN as Registered Nurse  REFERRING PROVIDER: Dr. Felicie Morn REASON FOR VISIT Follow up for treatment of  lung cancer  HISTORY OF PRESENTING ILLNESS:  Tamara Parrish is a  62 y.o.  female with PMH listed below who was referred to me for evaluation of newly diagnosed lung cancer. Patient was admitted at the end of June and beginning of July for 2 times due to recurrent community-acquired pneumonia despite treating with antibiotics.  CT skin showed a left upper lobe consolidation suspicious malignancy enlarged bilateral hilar and mediastinal lymph nodes.  Patient was referred to see pulmonology Dr. Felicie Morn.   CT chest7/08/2017, in comparison with recent chest x-rays>>imaging personally reviewed, there are fibrotic changes of the lungs bibasilarly, greatest in the left, these are also seen in the lingula peripherally, right apex. There is masslike consolidation in the apical posterior segment of the left upper lobe. There is significant lymphadenopathy seen in the right hilar, subcarinal and right paratracheal area. There is a pulmonary arterial enlargement suggestive of pulmonary hypertension. Opacification of the left upper lobe appears advanced in comparison to previous chest x-ray 2 weeks prior 05/05/2018 CT chest,  with interval increase in size of large area of masslike consolidation within the left upper lobe which now appears to involve the adjacent chest wall as well as the left third and fourth ribs.  Mediastinal and hilar adenopathy, Patient underwent E bus bronchoscopy with E bus guided biopsy. Left upper lobe lavage was atypical cells consistent with squamous cell carcinoma. Left upper lobe brushing is positive for squamous cell carcinoma Subcarinal lymph node biopsy  negative for malignancy Right hilar lymph node biopsy negative for malignancy. Left upper lobe transbronchial biopsy positive for squamous cell carcinoma. Patient was referred to cancer center to establish cancer care with me. Today patient was accompanied by multiple family members including daughters and partner.  Denies any headache, shortness of breath, chest pain, abdominal pain, leg swelling.fever or chill.  Has history of rheumatoid arthritis follows up with Dr. Meda Coffee at [facility] clinic Patient is on methotrexate  weekly, and  Remicade treatment. Reports persistent left posterior chest wall pain, pleuritic, i worsened with deep breath. 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   INTERVAL HISTORY Tamara Parrish is a 62 y.o. female who has above history reviewed by me today presents for assessment prior to cycle 4 chemotherapy for stage IV non-small cell lung cancer. Status post palliative radiation 07/18/2018.  #Reports feeling nauseated in the morning.  Need refills for antiemetics.  #Continue to have chronic joint pains due to rheumatoid arthritis.  Also have neoplasm related left posterior rib cage pain. Reports that oxycodone has been helpful in relieving her generalized joint pains.  Request refills. Taking oxycodone 10 mg every 6 hours  #Continue to feel anxious.  Takes Xanax 0.25 mg once a day as needed.  Request refills.  She is also supposed to take Remeron 7.5 mg at night which she is not taking.  #Chronic joint pain secondary to rheumatoid arthritis, on Plaquenil.  Symptoms stable.'  #Neuropathy, continue to have intermittent fingertip numbness and tingling, stable.  Worsened with cold temperature.     Review of Systems  Constitutional: Positive for fatigue. Negative for appetite change, chills and fever.  HENT:   Negative  for hearing loss and voice change.   Eyes: Negative for eye  problems.  Respiratory: Negative for chest tightness and cough.   Cardiovascular: Negative for chest pain.  Gastrointestinal: Negative for abdominal distention, abdominal pain and blood in stool.  Endocrine: Negative for hot flashes.  Genitourinary: Negative for difficulty urinating and frequency.   Musculoskeletal: Positive for arthralgias.  Skin: Negative for itching and rash.  Neurological: Negative for extremity weakness.  Hematological: Negative for adenopathy.  Psychiatric/Behavioral: Negative for confusion. The patient is nervous/anxious.     Marland Kitchen     MEDICAL HISTORY:  Past Medical History:  Diagnosis Date  . Cancer ([place])    lung ca DX 2019  . Collagen vascular disease (H[facility])    RA  . DM2 (diabetes mellitus, type 2) ([place])   . Dyspnea   . Hyperlipemia   . Hypertension   . Osteomyelitis ([place])   . Pneumonia   . RA (rheumatoid arthritis) ([place])   . Rheumatoid arthritis ([place])     SURGICAL HISTORY: Past Surgical History:  Procedure Laterality Date  . ENDOBRONCHIAL ULTRASOUND N/A 05/13/2018   Procedure: ENDOBRONCHIAL ULTRASOUND;  Surgeon: [name], MD;  Location: [facility] ORS;  Service: Pulmonary;  Laterality: N/A;  . PORTA CATH INSERTION N/A 05/27/2018   Procedure: PORTA CATH INSERTION;  Surgeon: Katha Cabal, MD;  Location: [place] [facility];  Service: Cardiovascular;  Laterality: N/A;  . TOE SURGERY      SOCIAL HISTORY: Social History   Socioeconomic History  . Marital status: Single    Spouse name: Not on file  . Number of children: 2  . Years of education: Not on file  . Highest education level: Not on file  Occupational History  . Not on file  Social Needs  . Financial resource strain: Not very hard  . Food insecurity:    Worry: Never true    Inability: Never true  . Transportation needs:    Medical: No    Non-medical: No  Tobacco Use  . Smoking status: Former Smoker    Packs/day: 1.00    Years: 40.00    Pack years: 40.00    Types:  Cigarettes    Last attempt to quit: 11/22/2017    Years since quitting: 0.7  . Smokeless tobacco: Never Used  . Tobacco comment: quit 2 weeks ago  Substance and Sexual Activity  . Alcohol use: Never    Frequency: Never  . Drug use: Never  . Sexual activity: Not on file  Lifestyle  . Physical activity:    Days per week: 0 days    Minutes per session: Not on file  . Stress: To some extent  Relationships  . Social connections:    Talks on phone: Three times a week    Gets together: More than three times a week    Attends religious service: Not on file    Active member of club or organization: Not on file    Attends meetings of clubs or organizations: Not on file    Relationship status: Not on file  . Intimate partner violence:    Fear of current or ex partner: No    Emotionally abused: No    Physically abused: No    Forced sexual activity: No  Other Topics Concern  . Not on file  Social History Narrative   Living at home with daughter.  Ambulates with a walker at baseline.    FAMILY HISTORY: Family History  Problem Relation Age of Onset  .  Diabetes Mother   . Lung cancer Father     ALLERGIES:  is allergic to ibuprofen and penicillins.  MEDICATIONS:  Current Outpatient Medications  Medication Sig Dispense Refill  . albuterol (PROVENTIL HFA;VENTOLIN HFA) 108 (90 Base) MCG/ACT inhaler Inhale 2 puffs into the lungs every 6 (six) hours as needed for wheezing or shortness of breath. 1 Inhaler 2  . ALPRAZolam (XANAX) 0.25 MG tablet Take 1 tablet (0.25 mg total) by mouth daily as needed for anxiety. 30 tablet 0  . amLODipine (NORVASC) 5 MG tablet Take 5 mg by mouth daily.  3  . aspirin 81 MG chewable tablet Chew 1 tablet by mouth daily.    Marland Kitchen atorvastatin (LIPITOR) 40 MG tablet Take 40 mg by mouth daily.  3  . dexamethasone (DECADRON) 4 MG tablet Take 2 tablets (8 mg total) by mouth daily. Start the day after chemotherapy for 2 days. 30 tablet 1  . ferrous sulfate 325 (65 FE) MG  EC tablet Take 1 tablet (325 mg total) by mouth 2 (two) times daily with a meal. 60 tablet 3  . folic acid (FOLVITE) 1 MG tablet Take 1 mg by mouth daily.  11  . hydroxychloroquine (PLAQUENIL) 200 MG tablet 1 tab twice a day, 90 days    . Insulin Glargine (BASAGLAR KWIKPEN) 100 UNIT/ML SOPN Inject 38 Units into the skin every morning.   12  . lidocaine-prilocaine (EMLA) cream Apply to affected area once 30 g 3  . lisinopril (PRINIVIL,ZESTRIL) 40 MG tablet Take 40 mg by mouth daily.  3  . metFORMIN (GLUCOPHAGE) 1000 MG tablet Take 1,000 mg by mouth 2 (two) times daily.  5  . metoprolol tartrate (LOPRESSOR) 25 MG tablet Take 25 mg by mouth 2 (two) times daily.  3  . mirtazapine (REMERON) 7.5 MG tablet Take 1 tablet (7.5 mg total) by mouth at bedtime. 30 tablet 3  . omeprazole (PRILOSEC) 20 MG capsule Take 1 capsule (20 mg total) by mouth daily. 90 capsule 0  . ondansetron (ZOFRAN) 8 MG tablet Take 1 tablet (8 mg total) by mouth 2 (two) times daily as needed for refractory nausea / vomiting. Start on day 3 after chemo. 30 tablet 2  . Oxycodone HCl 10 MG TABS Take 1 tablet (10 mg total) by mouth every 8 (eight) hours as needed. 63 tablet 0  . prochlorperazine (COMPAZINE) 10 MG tablet Take 1 tablet (10 mg total) by mouth every 6 (six) hours as needed (Nausea or vomiting). 30 tablet 1  . sucralfate (CARAFATE) 1 g tablet Take 1 tablet (1 g total) by mouth 3 (three) times daily. Dissolve in 3-4 tbsp warm water, swish and swallow 90 tablet 0  . triamterene-hydrochlorothiazide (DYAZIDE) 37.5-25 MG capsule Take 1 capsule by mouth daily.  3   No current facility-administered medications for this visit.      PHYSICAL EXAMINATION: ECOG PERFORMANCE STATUS: 1 - Symptomatic but completely ambulatory Vitals:   08/23/18 1041  BP: (!) 145/80  Pulse: 80  Resp: 18  Temp: (!) 96.2 F (35.7 C)  SpO2: 96%   Filed Weights   08/23/18 1041  Weight: 190 lb (86.2 kg)   Physical Exam Constitutional:       General: She is not in acute distress. HENT:     Head: Normocephalic and atraumatic.  Eyes:     General: No scleral icterus.    Pupils: Pupils are equal, round, and reactive to light.  Neck:     Musculoskeletal: Normal range of motion and  neck supple.  Cardiovascular:     Rate and Rhythm: Normal rate and regular rhythm.     Heart sounds: Murmur present.  Pulmonary:     Effort: Pulmonary effort is normal. No respiratory distress.     Breath sounds: No wheezing.  Abdominal:     General: Bowel sounds are normal. There is no distension.     Palpations: Abdomen is soft. There is no mass.     Tenderness: There is no abdominal tenderness.  Musculoskeletal: Normal range of motion.        General: No deformity.  Skin:    General: Skin is warm and dry.     Findings: No erythema or rash.  Neurological:     Mental Status: She is alert and oriented to person, place, and time.     Cranial Nerves: No cranial nerve deficit.     Coordination: Coordination normal.  Psychiatric:        Behavior: Behavior normal.        Thought Content: Thought content normal.        LABORATORY DATA:  I have reviewed the data as listed Lab Results  Component Value Date   WBC 7.0 08/23/2018   HGB 9.2 (L) 08/23/2018   HCT 30.3 (L) 08/23/2018   MCV 95.3 08/23/2018   PLT 310 08/23/2018   Recent Labs    07/12/18 0826 08/03/18 0842 08/23/18 1006  NA 139 140 140  K 5.0 4.5 4.5  CL 110 113* 107  CO2 23 21* 23  GLUCOSE 109* 90 221*  BUN 16 29* 17  CREATININE 0.89 1.00 1.13*  CALCIUM 9.2 9.2 8.8*  GFRNONAA >60 59* 51*  GFRAA >60 >60 59*  PROT 7.4 7.3 7.5  ALBUMIN 3.1* 3.2* 3.4*  AST '17 15 20  '$ ALT '10 9 11  '$ ALKPHOS 82 84 88  BILITOT 0.3 0.4 0.4   Iron/TIBC/Ferritin/ %Sat    Component Value Date/Time   IRON 27 (L) 05/25/2018 1207   TIBC 241 (L) 05/25/2018 1207   FERRITIN 181 05/25/2018 1207   IRONPCTSAT 11 05/25/2018 1207   RADIOGRAPHIC STUDIES: I have personally reviewed the radiological  images as listed and agreed with the findings in the report.  05/24/2018 PET scan  1. Highly hypermetabolic left upper lobe mass, maximum SUV 16.9, with chest wall invasion of the left third and fourth ribs and into the third intercostal space. 2. Separate pleural tumor deposit on the left at the T3-4 level medially near the neural foramen. 3. Hypermetabolic AP window lymph nodes. Probable left hilar lymph nodes confluent with the mass. Separate tumor nodules are noted in the left upper lobe. 4. Hypermetabolic sacrococcygeal junction without a well-defined lesion, probably from a fracture, less likely from early metastatic disease.   ASSESSMENT & PLAN:  1. Malignant neoplasm of lung, unspecified laterality, unspecified part of lung ([place])   2. Rheumatoid arthritis, involving unspecified site, unspecified rheumatoid factor presence ([place])   3. Encounter for antineoplastic chemotherapy   4. 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  T4N2M1 CT after 4 cycles of chemotherapy was independently reviewed by me and discussed with patient. 08/22/2018 CT scan showed slightly interval decrease in the size of large left upper lobe lung mass with evidence of necrotic changes and a cavitations. Interval decrease in size of pulmonary nodules in the left lung and no new progressive findings are identified.  Some interval sclerosis healing of directly invaded left third and fourth ribs with resolution of  associated soft tissue components. Smaller mediastinal and hilar lymph nodes. Overall patient tolerates chemotherapy well with manageable side effects.  Partial response after 4 cycles of carboplatin and Taxol. Advised patient to utilize home antiemetics for nausea. Labs are reviewed and discussed with patient.  Counts acceptable to proceed with cycle 5 carboplatin and Taxol on 08/25/2017. Immunotherapy was not offered due to baseline severe rheumatoid arthritis.   #Neuropathy, chemotherapy-induced,  grade 1.  Taxol was reduced to 135 mg/m.  Stable neuropathy symptoms.  #Rheumatoid arthritis, off methotrexate.  Continue Plaquenil.  Advised patient to continue follow-up with Dr. Meda Coffee Patient request pain medication refill.  Currently on oxycodone 10 mg every 8 hours as needed.  Advised patient to follow instructions and not to exceed recommended dose.  She voices understanding. Refilled oxycodone 10 mg every 8 hours as needed for pain, 21-day supply.   #Anxiety, not well controlled.  Patient takes Xanax with some symptom relief.  Reviewed her Xanax 0.25 mg  daily as needed. Advised patient to start taking Remeron 7.5 milligrams daily.    #Anemia, multifactorial.  Stable hemoglobin.  Chemotherapy +/-low iron store. Continue to monitor. #Slightly elevated creatinine, advised patient to increase oral hydration. We spent sufficient time to discuss many aspect of care, questions were answered to patient's satisfaction.  Return of visit: 3 weeks.   Earlie Server, MD, PhD Hematology Oncology [facility] at [facility] Pager- [phone] 08/23/2018

## 2018-08-25 ENCOUNTER — Inpatient Hospital Stay: Payer: PRIVATE HEALTH INSURANCE | Attending: Oncology

## 2018-08-25 VITALS — BP 124/77 | HR 77 | Temp 99.0°F | Resp 18 | Wt 190.0 lb

## 2018-08-25 DIAGNOSIS — Z5111 Encounter for antineoplastic chemotherapy: Secondary | ICD-10-CM | POA: Diagnosis present

## 2018-08-25 DIAGNOSIS — R110 Nausea: Secondary | ICD-10-CM | POA: Insufficient documentation

## 2018-08-25 DIAGNOSIS — M069 Rheumatoid arthritis, unspecified: Secondary | ICD-10-CM | POA: Diagnosis not present

## 2018-08-25 DIAGNOSIS — G893 Neoplasm related pain (acute) (chronic): Secondary | ICD-10-CM | POA: Insufficient documentation

## 2018-08-25 DIAGNOSIS — C3412 Malignant neoplasm of upper lobe, left bronchus or lung: Secondary | ICD-10-CM | POA: Diagnosis present

## 2018-08-25 DIAGNOSIS — F419 Anxiety disorder, unspecified: Secondary | ICD-10-CM | POA: Diagnosis not present

## 2018-08-25 DIAGNOSIS — D649 Anemia, unspecified: Secondary | ICD-10-CM | POA: Diagnosis not present

## 2018-08-25 DIAGNOSIS — G629 Polyneuropathy, unspecified: Secondary | ICD-10-CM | POA: Insufficient documentation

## 2018-08-25 DIAGNOSIS — C3490 Malignant neoplasm of unspecified part of unspecified bronchus or lung: Secondary | ICD-10-CM

## 2018-08-25 DIAGNOSIS — Z87891 Personal history of nicotine dependence: Secondary | ICD-10-CM | POA: Insufficient documentation

## 2018-08-25 DIAGNOSIS — R5382 Chronic fatigue, unspecified: Secondary | ICD-10-CM | POA: Insufficient documentation

## 2018-08-25 MED ORDER — SODIUM CHLORIDE 0.9 % IV SOLN
Freq: Once | INTRAVENOUS | Status: AC
  Administered 2018-08-25: 09:00:00 via INTRAVENOUS
  Filled 2018-08-25: qty 250

## 2018-08-25 MED ORDER — PALONOSETRON HCL INJECTION 0.25 MG/5ML
0.2500 mg | Freq: Once | INTRAVENOUS | Status: AC
  Administered 2018-08-25: 0.25 mg via INTRAVENOUS
  Filled 2018-08-25: qty 5

## 2018-08-25 MED ORDER — SODIUM CHLORIDE 0.9 % IV SOLN
450.0000 mg | Freq: Once | INTRAVENOUS | Status: AC
  Administered 2018-08-25: 450 mg via INTRAVENOUS
  Filled 2018-08-25: qty 45

## 2018-08-25 MED ORDER — SODIUM CHLORIDE 0.9 % IV SOLN
135.0000 mg/m2 | Freq: Once | INTRAVENOUS | Status: AC
  Administered 2018-08-25: 264 mg via INTRAVENOUS
  Filled 2018-08-25: qty 44

## 2018-08-25 MED ORDER — SODIUM CHLORIDE 0.9 % IV SOLN
20.0000 mg | Freq: Once | INTRAVENOUS | Status: AC
  Administered 2018-08-25: 20 mg via INTRAVENOUS
  Filled 2018-08-25: qty 2

## 2018-08-25 MED ORDER — HEPARIN SOD (PORK) LOCK FLUSH 100 UNIT/ML IV SOLN
500.0000 [IU] | Freq: Once | INTRAVENOUS | Status: AC | PRN
  Administered 2018-08-25: 500 [IU]
  Filled 2018-08-25: qty 5

## 2018-08-25 MED ORDER — FAMOTIDINE IN NACL 20-0.9 MG/50ML-% IV SOLN
20.0000 mg | Freq: Once | INTRAVENOUS | Status: AC
  Administered 2018-08-25: 20 mg via INTRAVENOUS
  Filled 2018-08-25: qty 50

## 2018-08-25 MED ORDER — DIPHENHYDRAMINE HCL 50 MG/ML IJ SOLN
50.0000 mg | Freq: Once | INTRAMUSCULAR | Status: AC
  Administered 2018-08-25: 50 mg via INTRAVENOUS
  Filled 2018-08-25: qty 1

## 2018-09-01 ENCOUNTER — Encounter: Payer: Self-pay | Admitting: Radiation Oncology

## 2018-09-01 ENCOUNTER — Ambulatory Visit
Admission: RE | Admit: 2018-09-01 | Discharge: 2018-09-01 | Disposition: A | Discharge status: Home / Self Care | Payer: PRIVATE HEALTH INSURANCE | Source: Ambulatory Visit | Attending: Radiation Oncology | Admitting: Radiation Oncology

## 2018-09-01 ENCOUNTER — Other Ambulatory Visit: Payer: Self-pay

## 2018-09-01 VITALS — BP 114/67 | HR 97 | Temp 97.0°F | Resp 16 | Wt 189.0 lb

## 2018-09-01 DIAGNOSIS — C3492 Malignant neoplasm of unspecified part of left bronchus or lung: Secondary | ICD-10-CM | POA: Insufficient documentation

## 2018-09-01 DIAGNOSIS — Z923 Personal history of irradiation: Secondary | ICD-10-CM | POA: Insufficient documentation

## 2018-09-01 DIAGNOSIS — R918 Other nonspecific abnormal finding of lung field: Secondary | ICD-10-CM | POA: Insufficient documentation

## 2018-09-01 DIAGNOSIS — C7951 Secondary malignant neoplasm of bone: Secondary | ICD-10-CM

## 2018-09-01 NOTE — Progress Notes (Signed)
Radiation Oncology Follow up Note  Name: Tamara Parrish   Date:   09/01/2018 MRN:  [id number] DOB: Feb 19, 1956    This 63 y.o. female presents to the clinic today for one-month follow-up status post concurrent chemoradiation therapy for stage IVsquamous cell carcinoma of the left lung with positive pulmonary lavage cytology.  REFERRING PROVIDER: Glendon Axe, MD  HPI: patient is a 63 year old female now 1 month out having completed concurrent chemoradiation therapy with radiation intended as palliation a patient with known stage IV squamous cell carcinoma of the left lung..she is seen today and is doing well. She's having no dysphagia some minor nausea. Slight cough no hemoptysis or chest tightness. Recent CT scan shows slight interval decrease in the size of the left large upper lobe mass with evidence of necrotic changes and cavitation. There is also decreased size and pulmonary nodules in the left lung. Also is noted some interval healing of directly invading tissue of the left third and fourth rib.she is currently on carboplatinum and Taxol for palliation under medical oncology's direction.  COMPLICATIONS OF TREATMENT: none  FOLLOW UP COMPLIANCE: keeps appointments   PHYSICAL EXAM:  BP 114/67 (BP Location: Left Arm, Patient Position: Sitting)   Pulse 97   Temp (!) 97 F (36.1 C) (Tympanic)   Resp 16   Wt 189 lb 0.7 oz (85.7 kg)   BMI 30.51 kg/m  Well-developed well-nourished patient in NAD. HEENT reveals PERLA, EOMI, discs not visualized.  Oral cavity is clear. No oral mucosal lesions are identified. Neck is clear without evidence of cervical or supraclavicular adenopathy. Lungs are clear to A&P. Cardiac examination is essentially unremarkable with regular rate and rhythm without murmur rub or thrill. Abdomen is benign with no organomegaly or masses noted. Motor sensory and DTR levels are equal and symmetric in the upper and lower extremities. Cranial nerves II through XII are  grossly intact. Proprioception is intact. No peripheral adenopathy or edema is identified. No motor or sensory levels are noted. Crude visual fields are within normal range.  RADIOLOGY RESULTS: CT scan is reviewed and compatible above-stated findings  PLAN: present time she is doing well she continues onpalliative chemotherapy under medical oncology's direction. Am please were overall progress. I have asked to see her back in 3-4 months for follow-up. Be happy to reevaluate her any time should further palliative treatment be indicated.  I would like to take this opportunity to thank you for allowing me to participate in the care of your patient.Noreene Filbert, MD

## 2018-09-15 ENCOUNTER — Other Ambulatory Visit: Payer: Self-pay

## 2018-09-15 ENCOUNTER — Inpatient Hospital Stay (HOSPITAL_BASED_OUTPATIENT_CLINIC_OR_DEPARTMENT_OTHER): Payer: PRIVATE HEALTH INSURANCE | Admitting: Oncology

## 2018-09-15 ENCOUNTER — Other Ambulatory Visit: Payer: Self-pay | Admitting: Oncology

## 2018-09-15 ENCOUNTER — Inpatient Hospital Stay: Payer: PRIVATE HEALTH INSURANCE

## 2018-09-15 ENCOUNTER — Encounter: Payer: Self-pay | Admitting: Oncology

## 2018-09-15 VITALS — BP 138/75 | HR 88 | Temp 96.8°F | Resp 18 | Wt 192.3 lb

## 2018-09-15 VITALS — BP 137/79 | HR 96 | Temp 97.4°F | Resp 20

## 2018-09-15 DIAGNOSIS — C3412 Malignant neoplasm of upper lobe, left bronchus or lung: Secondary | ICD-10-CM | POA: Diagnosis not present

## 2018-09-15 DIAGNOSIS — R110 Nausea: Secondary | ICD-10-CM

## 2018-09-15 DIAGNOSIS — G893 Neoplasm related pain (acute) (chronic): Secondary | ICD-10-CM

## 2018-09-15 DIAGNOSIS — G629 Polyneuropathy, unspecified: Secondary | ICD-10-CM

## 2018-09-15 DIAGNOSIS — Z87891 Personal history of nicotine dependence: Secondary | ICD-10-CM

## 2018-09-15 DIAGNOSIS — R5382 Chronic fatigue, unspecified: Secondary | ICD-10-CM

## 2018-09-15 DIAGNOSIS — C3490 Malignant neoplasm of unspecified part of unspecified bronchus or lung: Secondary | ICD-10-CM

## 2018-09-15 DIAGNOSIS — Z5111 Encounter for antineoplastic chemotherapy: Secondary | ICD-10-CM

## 2018-09-15 DIAGNOSIS — Z95828 Presence of other vascular implants and grafts: Secondary | ICD-10-CM

## 2018-09-15 DIAGNOSIS — D649 Anemia, unspecified: Secondary | ICD-10-CM

## 2018-09-15 DIAGNOSIS — F419 Anxiety disorder, unspecified: Secondary | ICD-10-CM

## 2018-09-15 DIAGNOSIS — M069 Rheumatoid arthritis, unspecified: Secondary | ICD-10-CM

## 2018-09-15 LAB — CBC WITH DIFFERENTIAL/PLATELET
ABS IMMATURE GRANULOCYTES: 0.04 10*3/uL (ref 0.00–0.07)
Basophils Absolute: 0.1 10*3/uL (ref 0.0–0.1)
Basophils Relative: 1 %
Eosinophils Absolute: 0.1 10*3/uL (ref 0.0–0.5)
Eosinophils Relative: 1 %
HCT: 26.5 % — ABNORMAL LOW (ref 36.0–46.0)
Hemoglobin: 8 g/dL — ABNORMAL LOW (ref 12.0–15.0)
IMMATURE GRANULOCYTES: 1 %
LYMPHS ABS: 0.9 10*3/uL (ref 0.7–4.0)
Lymphocytes Relative: 10 %
MCH: 29.4 pg (ref 26.0–34.0)
MCHC: 30.2 g/dL (ref 30.0–36.0)
MCV: 97.4 fL (ref 80.0–100.0)
Monocytes Absolute: 0.9 10*3/uL (ref 0.1–1.0)
Monocytes Relative: 10 %
NEUTROS PCT: 77 %
Neutro Abs: 6.8 10*3/uL (ref 1.7–7.7)
Platelets: 269 10*3/uL (ref 150–400)
RBC: 2.72 MIL/uL — ABNORMAL LOW (ref 3.87–5.11)
RDW: 17.8 % — ABNORMAL HIGH (ref 11.5–15.5)
WBC: 8.7 10*3/uL (ref 4.0–10.5)
nRBC: 0 % (ref 0.0–0.2)

## 2018-09-15 LAB — COMPREHENSIVE METABOLIC PANEL
ALT: 9 U/L (ref 0–44)
AST: 17 U/L (ref 15–41)
Albumin: 3.1 g/dL — ABNORMAL LOW (ref 3.5–5.0)
Alkaline Phosphatase: 81 U/L (ref 38–126)
Anion gap: 8 (ref 5–15)
BUN: 24 mg/dL — ABNORMAL HIGH (ref 8–23)
CO2: 22 mmol/L (ref 22–32)
Calcium: 8.3 mg/dL — ABNORMAL LOW (ref 8.9–10.3)
Chloride: 112 mmol/L — ABNORMAL HIGH (ref 98–111)
Creatinine, Ser: 1.01 mg/dL — ABNORMAL HIGH (ref 0.44–1.00)
GFR calc Af Amer: >60 mL/min (ref >60)
GFR calc non Af Amer: 59 mL/min — ABNORMAL LOW (ref >60)
Glucose, Bld: 189 mg/dL — ABNORMAL HIGH (ref 70–99)
Potassium: 4.3 mmol/L (ref 3.5–5.1)
Sodium: 142 mmol/L (ref 135–145)
Total Bilirubin: 0.4 mg/dL (ref 0.3–1.2)
Total Protein: 7.2 g/dL (ref 6.5–8.1)

## 2018-09-15 LAB — ABO/RH: ABO/RH(D): O POS

## 2018-09-15 MED ORDER — SODIUM CHLORIDE 0.9% FLUSH
10.0000 mL | Freq: Once | INTRAVENOUS | Status: AC
  Administered 2018-09-15: 10 mL via INTRAVENOUS
  Filled 2018-09-15: qty 10

## 2018-09-15 MED ORDER — SODIUM CHLORIDE 0.9 % IV SOLN
20.0000 mg | Freq: Once | INTRAVENOUS | Status: AC
  Administered 2018-09-15: 20 mg via INTRAVENOUS
  Filled 2018-09-15: qty 2

## 2018-09-15 MED ORDER — PALONOSETRON HCL INJECTION 0.25 MG/5ML
0.2500 mg | Freq: Once | INTRAVENOUS | Status: AC
  Administered 2018-09-15: 0.25 mg via INTRAVENOUS
  Filled 2018-09-15: qty 5

## 2018-09-15 MED ORDER — HEPARIN SOD (PORK) LOCK FLUSH 100 UNIT/ML IV SOLN
500.0000 [IU] | Freq: Once | INTRAVENOUS | Status: AC
  Administered 2018-09-15: 500 [IU] via INTRAVENOUS
  Filled 2018-09-15: qty 5

## 2018-09-15 MED ORDER — SODIUM CHLORIDE 0.9 % IV SOLN
135.0000 mg/m2 | Freq: Once | INTRAVENOUS | Status: AC
  Administered 2018-09-15: 264 mg via INTRAVENOUS
  Filled 2018-09-15: qty 44

## 2018-09-15 MED ORDER — DIPHENHYDRAMINE HCL 25 MG PO CAPS
25.0000 mg | ORAL_CAPSULE | Freq: Once | ORAL | Status: AC
  Administered 2018-09-15: 25 mg via ORAL
  Filled 2018-09-15: qty 1

## 2018-09-15 MED ORDER — HEPARIN SOD (PORK) LOCK FLUSH 100 UNIT/ML IV SOLN: 500.0000 [IU] | Freq: Once | INTRAVENOUS | Status: DC | PRN

## 2018-09-15 MED ORDER — FAMOTIDINE IN NACL 20-0.9 MG/50ML-% IV SOLN
20.0000 mg | Freq: Once | INTRAVENOUS | Status: AC
  Administered 2018-09-15: 20 mg via INTRAVENOUS
  Filled 2018-09-15: qty 50

## 2018-09-15 MED ORDER — SODIUM CHLORIDE 0.9 % IV SOLN
500.0000 mg | Freq: Once | INTRAVENOUS | Status: AC
  Administered 2018-09-15: 500 mg via INTRAVENOUS
  Filled 2018-09-15: qty 50

## 2018-09-15 MED ORDER — ACETAMINOPHEN 325 MG PO TABS
650.0000 mg | ORAL_TABLET | Freq: Once | ORAL | Status: AC
  Administered 2018-09-15: 650 mg via ORAL
  Filled 2018-09-15: qty 2

## 2018-09-15 MED ORDER — DIPHENHYDRAMINE HCL 50 MG/ML IJ SOLN
50.0000 mg | Freq: Once | INTRAMUSCULAR | Status: AC
  Administered 2018-09-15: 50 mg via INTRAVENOUS
  Filled 2018-09-15: qty 1

## 2018-09-15 MED ORDER — SODIUM CHLORIDE 0.9 % IV SOLN
Freq: Once | INTRAVENOUS | Status: AC
  Administered 2018-09-15: 10:00:00 via INTRAVENOUS
  Filled 2018-09-15: qty 250

## 2018-09-15 NOTE — Progress Notes (Signed)
Patient here for follow up. Pt states sometimes she feels like she has no energy. Pt complains of feeling nauseated most days.

## 2018-09-15 NOTE — Progress Notes (Addendum)
Hematology/Oncology follow up note [facility] Telephone:([phone]) [phone] Fax:(336) [phone]   Patient Care Team: Glendon Axe, MD as PCP - General (Internal Medicine) Telford Nab, RN as Registered Nurse  REFERRING PROVIDER: Dr. Felicie Morn REASON FOR VISIT Follow up for treatment of  lung cancer  HISTORY OF PRESENTING ILLNESS:  Tamara Parrish is a  63 y.o.  female with PMH listed below who was referred to me for evaluation of newly diagnosed lung cancer. Patient was admitted at the end of June and beginning of July for 2 times due to recurrent community-acquired pneumonia despite treating with antibiotics.  CT skin showed a left upper lobe consolidation suspicious malignancy enlarged bilateral hilar and mediastinal lymph nodes.  Patient was referred to see pulmonology Dr. Felicie Morn.   CT chest7/08/2017, in comparison with recent chest x-rays>>imaging personally reviewed, there are fibrotic changes of the lungs bibasilarly, greatest in the left, these are also seen in the lingula peripherally, right apex. There is masslike consolidation in the apical posterior segment of the left upper lobe. There is significant lymphadenopathy seen in the right hilar, subcarinal and right paratracheal area. There is a pulmonary arterial enlargement suggestive of pulmonary hypertension. Opacification of the left upper lobe appears advanced in comparison to previous chest x-ray 2 weeks prior 05/05/2018 CT chest,  with interval increase in size of large area of masslike consolidation within the left upper lobe which now appears to involve the adjacent chest wall as well as the left third and fourth ribs.  Mediastinal and hilar adenopathy, Patient underwent E bus bronchoscopy with E bus guided biopsy. Left upper lobe lavage was atypical cells consistent with squamous cell carcinoma. Left upper lobe brushing is positive for squamous cell carcinoma Subcarinal lymph node biopsy  negative for malignancy Right hilar lymph node biopsy negative for malignancy. Left upper lobe transbronchial biopsy positive for squamous cell carcinoma. Patient was referred to cancer center to establish cancer care with me. Today patient was accompanied by multiple family members including daughters and partner.  Denies any headache, shortness of breath, chest pain, abdominal pain, leg swelling.fever or chill.  Has history of rheumatoid arthritis follows up with Dr. Meda Coffee at [facility] Patient is on methotrexate  weekly, and  Remicade treatment. Reports persistent left posterior chest wall pain, pleuritic, i worsened with deep breath. She lives with daughters  # Foundation one testing showed foundation one SE83 splice site [id number]  no targetable mutation.   # 08/22/2018 CT scan showed slightly interval decrease in the size of large left upper lobe lung mass with evidence of necrotic changes and a cavitations.  Interval decrease in size of pulmonary nodules in the left lung and no new progressive findings are identified.  Some interval sclerosis healing of directly invaded left third and fourth ribs with resolution of associated soft tissue components. Smaller mediastinal and hilar lymph nodes.   INTERVAL HISTORY Tamara Parrish is a 63 y.o. female who has above history reviewed by me today presents for assessment prior to cycle 6 chemotherapy for stage IV non-small cell lung cancer. Status post palliative radiation 07/18/2018.  #Overall tolerating chemotherapy well.  She feels nauseated every day, has been using home antiemetics with symptom relief.  No vomiting.Marland Kitchen #Fatigue: reports worsening fatigue. Chronic onset, perisistent, no aggravating or improving factors, no associated symptoms.   #Anxiety, takes Xanax 0.25 mg once a day as needed.  She is supposed to take Remeron 7.5 mg at night.  She did not know if she is taking or  not. #Chronic joint pain due to rheumatoid  arthritis.  On Plaquenil. #Neoplasm related left posterior rib cage pain.  Taking oxycodone 10 mg, on average she takes once a day. #Neuropathy, continue to have intermittent fingertip numbness and tingling, stable.  Worsened with cold temperature.     Review of Systems  Constitutional: Positive for fatigue. Negative for appetite change, chills and fever.  HENT:   Negative for hearing loss and voice change.   Eyes: Negative for eye problems.  Respiratory: Negative for chest tightness and cough.   Cardiovascular: Negative for chest pain.  Gastrointestinal: Negative for abdominal distention, abdominal pain and blood in stool.  Endocrine: Negative for hot flashes.  Genitourinary: Negative for difficulty urinating and frequency.   Musculoskeletal: Positive for arthralgias.  Skin: Negative for itching and rash.       Right anterior chest wall Mediport, no erythema or discharges.  Neurological: Negative for extremity weakness.  Hematological: Negative for adenopathy.  Psychiatric/Behavioral: Negative for confusion. The patient is nervous/anxious.     Marland Kitchen     MEDICAL HISTORY:  Past Medical History:  Diagnosis Date  . Cancer ([place])    lung ca DX 2019  . Collagen vascular disease (HCC)    RA  . DM2 (diabetes mellitus, type 2) ([place])   . Dyspnea   . Hyperlipemia   . Hypertension   . Osteomyelitis ([place])   . Pneumonia   . RA (rheumatoid arthritis) ([place])   . Rheumatoid arthritis ([place])     SURGICAL HISTORY: Past Surgical History:  Procedure Laterality Date  . ENDOBRONCHIAL ULTRASOUND N/A 05/13/2018   Procedure: ENDOBRONCHIAL ULTRASOUND;  Surgeon: [name], MD;  Location: [facility] ORS;  Service: Pulmonary;  Laterality: N/A;  . PORTA CATH INSERTION N/A 05/27/2018   Procedure: PORTA CATH INSERTION;  Surgeon: Katha Cabal, MD;  Location: [facility];  Service: Cardiovascular;  Laterality: N/A;  . TOE SURGERY      SOCIAL HISTORY: Social History   Socioeconomic  History  . Marital status: Single    Spouse name: Not on file  . Number of children: 2  . Years of education: Not on file  . Highest education level: Not on file  Occupational History  . Not on file  Social Needs  . Financial resource strain: Not very hard  . Food insecurity:    Worry: Never true    Inability: Never true  . Transportation needs:    Medical: No    Non-medical: No  Tobacco Use  . Smoking status: Former Smoker    Packs/day: 1.00    Years: 40.00    Pack years: 40.00    Types: Cigarettes    Last attempt to quit: 11/22/2017    Years since quitting: 0.8  . Smokeless tobacco: Never Used  . Tobacco comment: quit 2 weeks ago  Substance and Sexual Activity  . Alcohol use: Never    Frequency: Never  . Drug use: Never  . Sexual activity: Not on file  Lifestyle  . Physical activity:    Days per week: 0 days    Minutes per session: Not on file  . Stress: To some extent  Relationships  . Social connections:    Talks on phone: Three times a week    Gets together: More than three times a week    Attends religious service: Not on file    Active member of club or organization: Not on file    Attends meetings of clubs or organizations: Not on file  Relationship status: Not on file  . Intimate partner violence:    Fear of current or ex partner: No    Emotionally abused: No    Physically abused: No    Forced sexual activity: No  Other Topics Concern  . Not on file  Social History Narrative   Living at home with daughter.  Ambulates with a walker at baseline.    FAMILY HISTORY: Family History  Problem Relation Age of Onset  . Diabetes Mother   . Lung cancer Father     ALLERGIES:  is allergic to ibuprofen and penicillins.  MEDICATIONS:  Current Outpatient Medications  Medication Sig Dispense Refill  . albuterol (PROVENTIL HFA;VENTOLIN HFA) 108 (90 Base) MCG/ACT inhaler Inhale 2 puffs into the lungs every 6 (six) hours as needed for wheezing or shortness of  breath. 1 Inhaler 2  . ALPRAZolam (XANAX) 0.25 MG tablet Take 1 tablet (0.25 mg total) by mouth daily as needed for anxiety. 30 tablet 0  . amLODipine (NORVASC) 5 MG tablet Take 5 mg by mouth daily.  3  . aspirin 81 MG chewable tablet Chew 1 tablet by mouth daily.    Marland Kitchen atorvastatin (LIPITOR) 40 MG tablet Take 40 mg by mouth daily.  3  . dexamethasone (DECADRON) 4 MG tablet Take 2 tablets (8 mg total) by mouth daily. Start the day after chemotherapy for 2 days. 30 tablet 1  . ferrous sulfate 325 (65 FE) MG EC tablet Take 1 tablet (325 mg total) by mouth 2 (two) times daily with a meal. 60 tablet 3  . folic acid (FOLVITE) 1 MG tablet Take 1 mg by mouth daily.  11  . hydroxychloroquine (PLAQUENIL) 200 MG tablet 1 tab twice a day, 90 days    . Insulin Glargine (BASAGLAR KWIKPEN) 100 UNIT/ML SOPN Inject 38 Units into the skin every morning.   12  . lidocaine-prilocaine (EMLA) cream Apply to affected area once 30 g 3  . lisinopril (PRINIVIL,ZESTRIL) 40 MG tablet Take 40 mg by mouth daily.  3  . metFORMIN (GLUCOPHAGE) 1000 MG tablet Take 1,000 mg by mouth 2 (two) times daily.  5  . metoprolol tartrate (LOPRESSOR) 25 MG tablet Take 25 mg by mouth 2 (two) times daily.  3  . mirtazapine (REMERON) 7.5 MG tablet Take 1 tablet (7.5 mg total) by mouth at bedtime. 30 tablet 3  . omeprazole (PRILOSEC) 20 MG capsule Take 1 capsule (20 mg total) by mouth daily. 90 capsule 0  . ondansetron (ZOFRAN) 8 MG tablet Take 1 tablet (8 mg total) by mouth 2 (two) times daily as needed for refractory nausea / vomiting. Start on day 3 after chemo. 30 tablet 2  . Oxycodone HCl 10 MG TABS Take 1 tablet (10 mg total) by mouth every 8 (eight) hours as needed. 63 tablet 0  . prochlorperazine (COMPAZINE) 10 MG tablet Take 1 tablet (10 mg total) by mouth every 6 (six) hours as needed (Nausea or vomiting). 30 tablet 1  . sucralfate (CARAFATE) 1 g tablet Take 1 tablet (1 g total) by mouth 3 (three) times daily. Dissolve in 3-4 tbsp warm  water, swish and swallow (Patient not taking: Reported on 09/01/2018) 90 tablet 0  . triamterene-hydrochlorothiazide (DYAZIDE) 37.5-25 MG capsule Take 1 capsule by mouth daily.  3   No current facility-administered medications for this visit.    Facility-Administered Medications Ordered in Other Visits  Medication Dose Route Frequency Provider Last Rate Last Dose  . heparin lock flush 100 unit/mL  500  Units Intravenous Once Earlie Server, MD         PHYSICAL EXAMINATION: ECOG PERFORMANCE STATUS: 1 - Symptomatic but completely ambulatory Vitals:   09/15/18 0854  BP: 138/75  Pulse: 88  Resp: 18  Temp: (!) 96.8 F (36 C)  SpO2: 97%   Filed Weights   09/15/18 0854  Weight: 192 lb 4.8 oz (87.2 kg)   Physical Exam Constitutional:      General: She is not in acute distress. HENT:     Head: Normocephalic and atraumatic.  Eyes:     General: No scleral icterus.    Pupils: Pupils are equal, round, and reactive to light.  Neck:     Musculoskeletal: Normal range of motion and neck supple.  Cardiovascular:     Rate and Rhythm: Normal rate and regular rhythm.     Heart sounds: Murmur present.  Pulmonary:     Effort: Pulmonary effort is normal. No respiratory distress.     Breath sounds: No wheezing.  Abdominal:     General: Bowel sounds are normal. There is no distension.     Palpations: Abdomen is soft. There is no mass.     Tenderness: There is no abdominal tenderness.  Musculoskeletal: Normal range of motion.        General: No deformity.  Skin:    General: Skin is warm and dry.     Findings: No erythema or rash.  Neurological:     Mental Status: She is alert and oriented to person, place, and time.     Cranial Nerves: No cranial nerve deficit.     Coordination: Coordination normal.  Psychiatric:        Behavior: Behavior normal.        Thought Content: Thought content normal.        LABORATORY DATA:  I have reviewed the data as listed Lab Results  Component Value Date    WBC 7.0 08/23/2018   HGB 9.2 (L) 08/23/2018   HCT 30.3 (L) 08/23/2018   MCV 95.3 08/23/2018   PLT 310 08/23/2018   Recent Labs    07/12/18 0826 08/03/18 0842 08/23/18 1006  NA 139 140 140  K 5.0 4.5 4.5  CL 110 113* 107  CO2 23 21* 23  GLUCOSE 109* 90 221*  BUN 16 29* 17  CREATININE 0.89 1.00 1.13*  CALCIUM 9.2 9.2 8.8*  GFRNONAA >60 59* 51*  GFRAA >60 >60 59*  PROT 7.4 7.3 7.5  ALBUMIN 3.1* 3.2* 3.4*  AST '17 15 20  '$ ALT '10 9 11  '$ ALKPHOS 82 84 88  BILITOT 0.3 0.4 0.4   Iron/TIBC/Ferritin/ %Sat    Component Value Date/Time   IRON 27 (L) 05/25/2018 1207   TIBC 241 (L) 05/25/2018 1207   FERRITIN 181 05/25/2018 1207   IRONPCTSAT 11 05/25/2018 1207   RADIOGRAPHIC STUDIES: I have personally reviewed the radiological images as listed and agreed with the findings in the report.  05/24/2018 PET scan  1. Highly hypermetabolic left upper lobe mass, maximum SUV 16.9, with chest wall invasion of the left third and fourth ribs and into the third intercostal space. 2. Separate pleural tumor deposit on the left at the T3-4 level medially near the neural foramen. 3. Hypermetabolic AP window lymph nodes. Probable left hilar lymph nodes confluent with the mass. Separate tumor nodules are noted in the left upper lobe. 4. Hypermetabolic sacrococcygeal junction without a well-defined lesion, probably from a fracture, less likely from early metastatic disease.   ASSESSMENT &  PLAN:  1. Malignant neoplasm of lung, unspecified laterality, unspecified part of lung ([place])   2. Rheumatoid arthritis, involving unspecified site, unspecified rheumatoid factor presence ([place])   3. Encounter for antineoplastic chemotherapy   4. Anxiety   5. 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 Overall tolerating chemotherapy with some side effects including nausea. Advised patient to use antiemetics prophylactically  to see if that will help preventing having symptoms.  Voices understanding.   Labs reviewed and discussed with patient. Counts are acceptable proceed with cycle 6 carboplatin and Taxol on 09/15/2018 Immunotherapy was not offered due to baseline severe rheumatology. Plan to obtain another CT chest with contrast after cycle 6 chemotherapy. We will touch base with rheumatology to see if patient can be switched to immunotherapy for maintenance. If not, will continue chemotherapy until disease progression.  #Neuropathy, chemotherapy-induced, grade 1.  Taxol was reduced to 135 mg/m.  Stable neuropathy symptoms.  Continue gabapentin  #Rheumatoid arthritis, off methotrexate.  Continue Plaquenil. Advised patient to continue follow-up with Dr. Meda Coffee.  #Neoplasm related pain and chronic pain secondary to rheumatoid arthritis. Patient requesting medication refill.  She feels that left posterior rib cage pain has been better controlled. Currently she has prescription of oxycodone 10 mg every 8 hours as needed.  She usually only need 1 oxycodone a day on average. She is responding to current treatments and also rib cage pain has been treated with Radiation.  Request refill. I will decreased her pain regimen to oxycodone '5mg'$  Q12 hours as needed for pain, dispense 60 tablets.   #Anxiety, not well controlled.  Patient takes Xanax with some symptom relief.  She is not sure if she is taking Remeron. Discussed again that I recommend patient to start taking Remeron 7.5 mg daily. Also discussed about Xanax being not optimal long-term anxiety control measures.  She voices understanding.  #Symptomatic anemia, multifactorial.  Hemoglobin is trending down to 8. Plan transfuse 1 unit of PRBC irradiated today.  We spent sufficient time to discuss many aspect of care, questions were answered to patient's satisfaction.  Return of visit: 3 weeks for discussion of CT scan and future management plan. Orders Placed This  Encounter  Procedures  . CT Chest W Contrast    Standing Status:   Future    Standing Expiration Date:   09/15/2019    Order Specific Question:   ** REASON FOR EXAM (FREE TEXT)    Answer:   lung cancer s/p 6 cycles of chemotherapy. treatment response    Order Specific Question:   If indicated for the ordered procedure, I authorize the administration of contrast media per Radiology protocol    Answer:   Yes    Order Specific Question:   Preferred imaging location?    Answer:   [facility]    Order Specific Question:   Radiology Contrast Protocol - do NOT remove file path    Answer:   [email or web address]  . ABO/Rh    Standing Status:   Future    Number of Occurrences:   1    Standing Expiration Date:   09/16/2019   Earlie Server, MD, PhD Hematology Oncology [facility] Pager- [phone] 09/15/2018

## 2018-09-16 ENCOUNTER — Other Ambulatory Visit: Payer: Self-pay | Admitting: *Deleted

## 2018-09-16 LAB — TYPE AND SCREEN
ABO/RH(D): O POS
Antibody Screen: NEGATIVE
Unit division: 0

## 2018-09-16 LAB — BPAM RBC
Blood Product Expiration Date: 202001302359
ISSUE DATE / TIME: 202001231453
Unit Type and Rh: 5100

## 2018-09-16 MED ORDER — OXYCODONE HCL 5 MG PO TABS: 5.0000 mg | ORAL_TABLET | Freq: Two times a day (BID) | ORAL | 0 refills | Status: DC | PRN

## 2018-09-16 NOTE — Telephone Encounter (Signed)
Ronny Bacon informed of doctor response

## 2018-09-16 NOTE — Telephone Encounter (Signed)
I will notified pt

## 2018-09-16 NOTE — Telephone Encounter (Signed)
Dr Tasia Catchings refilled Oxycodone, Pt needs to contact pharmacy for Remeron refill, there are 3 refills at pharmacy

## 2018-09-16 NOTE — Addendum Note (Signed)
Addended by: Earlie Server on: 09/16/2018 11:16 AM   Modules accepted: Orders

## 2018-09-18 LAB — PREPARE RBC (CROSSMATCH)

## 2018-09-22 ENCOUNTER — Telehealth: Payer: Self-pay | Admitting: *Deleted

## 2018-09-22 NOTE — Telephone Encounter (Signed)
Patient was upset that her Oxycodone was decreased, but took the prescription anyway, Wanted to confirm prescription Is correct. I read physician note to pharmacist Wells Guiles confirming reduction in dose and she will contact patient to discuss with her

## 2018-09-29 ENCOUNTER — Encounter: Payer: Self-pay | Admitting: Oncology

## 2018-09-29 ENCOUNTER — Ambulatory Visit: Payer: PRIVATE HEALTH INSURANCE | Admitting: Oncology

## 2018-09-29 ENCOUNTER — Other Ambulatory Visit: Payer: PRIVATE HEALTH INSURANCE

## 2018-10-03 ENCOUNTER — Ambulatory Visit: Payer: PRIVATE HEALTH INSURANCE

## 2018-10-04 ENCOUNTER — Ambulatory Visit
Admission: RE | Admit: 2018-10-04 | Discharge: 2018-10-04 | Disposition: A | Discharge status: Home / Self Care | Payer: PRIVATE HEALTH INSURANCE | Source: Ambulatory Visit | Attending: Oncology | Admitting: Oncology

## 2018-10-04 DIAGNOSIS — C3490 Malignant neoplasm of unspecified part of unspecified bronchus or lung: Secondary | ICD-10-CM

## 2018-10-04 MED ORDER — IOHEXOL 300 MG/ML  SOLN
75.0000 mL | Freq: Once | INTRAMUSCULAR | Status: AC | PRN
  Administered 2018-10-04: 75 mL via INTRAVENOUS

## 2018-10-06 ENCOUNTER — Encounter: Payer: Self-pay | Admitting: Oncology

## 2018-10-06 ENCOUNTER — Other Ambulatory Visit: Payer: Self-pay

## 2018-10-06 ENCOUNTER — Inpatient Hospital Stay (HOSPITAL_BASED_OUTPATIENT_CLINIC_OR_DEPARTMENT_OTHER): Payer: PRIVATE HEALTH INSURANCE | Admitting: Oncology

## 2018-10-06 ENCOUNTER — Inpatient Hospital Stay: Payer: PRIVATE HEALTH INSURANCE | Attending: Oncology

## 2018-10-06 VITALS — BP 143/82 | HR 88 | Temp 97.9°F | Ht 66.0 in | Wt 196.2 lb

## 2018-10-06 DIAGNOSIS — G893 Neoplasm related pain (acute) (chronic): Secondary | ICD-10-CM | POA: Diagnosis not present

## 2018-10-06 DIAGNOSIS — C3490 Malignant neoplasm of unspecified part of unspecified bronchus or lung: Secondary | ICD-10-CM

## 2018-10-06 DIAGNOSIS — Z5111 Encounter for antineoplastic chemotherapy: Secondary | ICD-10-CM | POA: Insufficient documentation

## 2018-10-06 DIAGNOSIS — C3411 Malignant neoplasm of upper lobe, right bronchus or lung: Secondary | ICD-10-CM | POA: Diagnosis not present

## 2018-10-06 DIAGNOSIS — C3412 Malignant neoplasm of upper lobe, left bronchus or lung: Secondary | ICD-10-CM | POA: Diagnosis present

## 2018-10-06 DIAGNOSIS — Z95828 Presence of other vascular implants and grafts: Secondary | ICD-10-CM | POA: Insufficient documentation

## 2018-10-06 DIAGNOSIS — M069 Rheumatoid arthritis, unspecified: Secondary | ICD-10-CM | POA: Diagnosis not present

## 2018-10-06 DIAGNOSIS — Z5112 Encounter for antineoplastic immunotherapy: Secondary | ICD-10-CM | POA: Diagnosis not present

## 2018-10-06 DIAGNOSIS — G629 Polyneuropathy, unspecified: Secondary | ICD-10-CM

## 2018-10-06 DIAGNOSIS — F419 Anxiety disorder, unspecified: Secondary | ICD-10-CM

## 2018-10-06 DIAGNOSIS — G8929 Other chronic pain: Secondary | ICD-10-CM | POA: Diagnosis not present

## 2018-10-06 DIAGNOSIS — Z87891 Personal history of nicotine dependence: Secondary | ICD-10-CM | POA: Diagnosis not present

## 2018-10-06 DIAGNOSIS — R5382 Chronic fatigue, unspecified: Secondary | ICD-10-CM

## 2018-10-06 LAB — CBC WITH DIFFERENTIAL/PLATELET
Abs Immature Granulocytes: 0.04 10*3/uL (ref 0.00–0.07)
Basophils Absolute: 0 10*3/uL (ref 0.0–0.1)
Basophils Relative: 1 %
Eosinophils Absolute: 0.1 10*3/uL (ref 0.0–0.5)
Eosinophils Relative: 1 %
HCT: 28.2 % — ABNORMAL LOW (ref 36.0–46.0)
Hemoglobin: 8.6 g/dL — ABNORMAL LOW (ref 12.0–15.0)
IMMATURE GRANULOCYTES: 1 %
Lymphocytes Relative: 13 %
Lymphs Abs: 0.9 10*3/uL (ref 0.7–4.0)
MCH: 28.7 pg (ref 26.0–34.0)
MCHC: 30.5 g/dL (ref 30.0–36.0)
MCV: 94 fL (ref 80.0–100.0)
Monocytes Absolute: 0.6 10*3/uL (ref 0.1–1.0)
Monocytes Relative: 9 %
NRBC: 0 % (ref 0.0–0.2)
Neutro Abs: 5 10*3/uL (ref 1.7–7.7)
Neutrophils Relative %: 75 %
Platelets: 209 10*3/uL (ref 150–400)
RBC: 3 MIL/uL — AB (ref 3.87–5.11)
RDW: 19.3 % — ABNORMAL HIGH (ref 11.5–15.5)
WBC: 6.7 10*3/uL (ref 4.0–10.5)

## 2018-10-06 LAB — COMPREHENSIVE METABOLIC PANEL
ALT: 10 U/L (ref 0–44)
AST: 18 U/L (ref 15–41)
Albumin: 3 g/dL — ABNORMAL LOW (ref 3.5–5.0)
Alkaline Phosphatase: 90 U/L (ref 38–126)
Anion gap: 6 (ref 5–15)
BUN: 17 mg/dL (ref 8–23)
CHLORIDE: 108 mmol/L (ref 98–111)
CO2: 25 mmol/L (ref 22–32)
Calcium: 8.4 mg/dL — ABNORMAL LOW (ref 8.9–10.3)
Creatinine, Ser: 0.95 mg/dL (ref 0.44–1.00)
GFR calc Af Amer: >60 mL/min (ref >60)
Glucose, Bld: 175 mg/dL — ABNORMAL HIGH (ref 70–99)
Potassium: 4.2 mmol/L (ref 3.5–5.1)
Sodium: 139 mmol/L (ref 135–145)
Total Bilirubin: 0.4 mg/dL (ref 0.3–1.2)
Total Protein: 7.5 g/dL (ref 6.5–8.1)

## 2018-10-06 MED ORDER — OXYCODONE HCL 5 MG PO TABS: 5.0000 mg | ORAL_TABLET | Freq: Every day | ORAL | 0 refills | Status: DC | PRN

## 2018-10-06 NOTE — Progress Notes (Signed)
Patient here today for follow up.   

## 2018-10-06 NOTE — Progress Notes (Signed)
START ON PATHWAY REGIMEN - Non-Small Cell Lung     A cycle is every 21 days:     Pembrolizumab      Paclitaxel      Carboplatin   **Always confirm dose/schedule in your pharmacy ordering system**  Patient Characteristics: Stage IV Metastatic, Squamous, PS = 0, 1, First Line, PD-L1 Expression Positive 1-49% (TPS) / Negative / Not Tested / Awaiting Test Results and Immunotherapy Candidate AJCC T Category: T4 Current Disease Status: Distant Metastases AJCC N Category: N2 AJCC M Category: M1a AJCC 8 Stage Grouping: IVA Histology: Squamous Cell Line of therapy: First Line ECOG Performance Status: 1 PD-L1 Expression Status: Quantity Not Sufficient Immunotherapy Candidate Status: Candidate for Immunotherapy Intent of Therapy: Non-Curative / Palliative Intent, Discussed with Patient

## 2018-10-07 ENCOUNTER — Encounter: Payer: Self-pay | Admitting: *Deleted

## 2018-10-07 ENCOUNTER — Telehealth: Payer: Self-pay | Admitting: *Deleted

## 2018-10-07 NOTE — Telephone Encounter (Signed)
Daughter, Velna Hatchet, notified

## 2018-10-07 NOTE — Telephone Encounter (Signed)
Oxycodone does not need PA.  Medication is denied due to being requested too early.  Pharmacy cannot refill until 10/13/18.  Dr Tasia Catchings notified.

## 2018-10-07 NOTE — Progress Notes (Signed)
Hematology/Oncology follow up note [facility] [phone] [phone]   Patient Care Team: Glendon Axe, MD as PCP - General (Internal Medicine) Telford Nab, RN as Registered Nurse  REFERRING PROVIDER: Dr. Felicie Morn REASON FOR VISIT Follow up for treatment of  lung cancer  HISTORY OF PRESENTING ILLNESS:  Tamara Parrish is a  63 y.o.  female with PMH listed below who was referred to me for evaluation of newly diagnosed lung cancer. Patient was admitted at the end of June and beginning of July for 2 times due to recurrent community-acquired pneumonia despite treating with antibiotics.  CT skin showed a left upper lobe consolidation suspicious malignancy enlarged bilateral hilar and mediastinal lymph nodes.  Patient was referred to see pulmonology Dr. Felicie Morn.   CT chest7/08/2017, in comparison with recent chest x-rays>>imaging personally reviewed, there are fibrotic changes of the lungs bibasilarly, greatest in the left, these are also seen in the lingula peripherally, right apex. There is masslike consolidation in the apical posterior segment of the left upper lobe. There is significant lymphadenopathy seen in the right hilar, subcarinal and right paratracheal area. There is a pulmonary arterial enlargement suggestive of pulmonary hypertension. Opacification of the left upper lobe appears advanced in comparison to previous chest x-ray 2 weeks prior 05/05/2018 CT chest,  with interval increase in size of large area of masslike consolidation within the left upper lobe which now appears to involve the adjacent chest wall as well as the left third and fourth ribs.  Mediastinal and hilar adenopathy, Patient underwent E bus bronchoscopy with E bus guided biopsy. Left upper lobe lavage was atypical cells consistent with squamous cell carcinoma. Left upper lobe brushing is positive for squamous cell carcinoma Subcarinal lymph node biopsy  negative for malignancy Right hilar lymph node biopsy negative for malignancy. Left upper lobe transbronchial biopsy positive for squamous cell carcinoma. Patient was referred to cancer center to establish cancer care with me. Today patient was accompanied by multiple family members including daughters and partner.  Denies any headache, shortness of breath, chest pain, abdominal pain, leg swelling.fever or chill.  Has history of rheumatoid arthritis follows up with Dr. Meda Coffee at [facility] clinic Patient is on methotrexate  weekly, and  Remicade treatment. Reports persistent left posterior chest wall pain, pleuritic, i worsened with deep breath. 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   # 08/22/2018 CT scan showed slightly interval decrease in the size of large left upper lobe lung mass with evidence of necrotic changes and a cavitations.  Interval decrease in size of pulmonary nodules in the left lung and no new progressive findings are identified.  Some interval sclerosis healing of directly invaded left third and fourth ribs with resolution of associated soft tissue components. Smaller mediastinal and hilar lymph nodes.   INTERVAL HISTORY Tamara Parrish is a 63 y.o. female who has above history reviewed by me today presents for assessment prior to chemotherapy for stage IV non-small cell lung cancer and discussion of interim CT scanning.. Status post palliative radiation 07/18/2018. Status post 6 cycles of carboplatin and Taxol. Interim CT scan showed relatively similar appearance of left upper lobe partially necrotic/cavitary mass. Although majority of the pulmonary nodules are similar.  A lingular nodule has significantly enlarged, probably from 7 mm previously Subcarinal nodes measuring 11 mm compared to 8 mm on prior image.  Patient was accompanied by 2  daughters. Reports chronic  joint pain due to rheumatoid arthritis controlled with current regimen.  She usually takes 1 oxycodone 10 mg daily on average. Chronic fatigue, at baseline.  #Anxiety, taking Xanax 0.25 mg once a day as needed.  Remeron 7.5 mg at night. #Chronic neuropathy unchanged.   She was recently seen by her Rheumatologist Dr.Bock and no active synovitis. Was recommended to continue plaquenil.   Review of Systems  Constitutional: Positive for fatigue. Negative for appetite change, chills and fever.  HENT:   Negative for hearing loss and voice change.   Eyes: Negative for eye problems.  Respiratory: Negative for chest tightness and cough.   Cardiovascular: Negative for chest pain.  Gastrointestinal: Negative for abdominal distention, abdominal pain and blood in stool.  Endocrine: Negative for hot flashes.  Genitourinary: Negative for difficulty urinating and frequency.   Musculoskeletal: Positive for arthralgias.  Skin: Negative for itching and rash.       Right anterior chest wall Mediport, no erythema or discharges.  Neurological: Negative for extremity weakness.  Hematological: Negative for adenopathy.  Psychiatric/Behavioral: Negative for confusion. The patient is nervous/anxious.     Marland Kitchen     MEDICAL HISTORY:  Past Medical History:  Diagnosis Date  . Cancer ([place])    lung ca DX 2019  . Collagen vascular disease (H[facility])    RA  . DM2 (diabetes mellitus, type 2) ([place])   . Dyspnea   . Hyperlipemia   . Hypertension   . Osteomyelitis ([place])   . Pneumonia   . RA (rheumatoid arthritis) ([place])   . Rheumatoid arthritis ([place])     SURGICAL HISTORY: Past Surgical History:  Procedure Laterality Date  . ENDOBRONCHIAL ULTRASOUND N/A 05/13/2018   Procedure: ENDOBRONCHIAL ULTRASOUND;  Surgeon: [name], MD;  Location: [facility] ORS;  Service: Pulmonary;  Laterality: N/A;  . PORTA CATH INSERTION N/A 05/27/2018   Procedure: PORTA CATH INSERTION;  Surgeon: Katha Cabal, MD;  Location: [place];  Service: Cardiovascular;  Laterality: N/A;  . TOE SURGERY      SOCIAL HISTORY: Social History   Socioeconomic History  . Marital status: Single    Spouse name: Not on file  . Number of children: 2  . Years of education: Not on file  . Highest education level: Not on file  Occupational History  . Not on file  Social Needs  . Financial resource strain: Not very hard  . Food insecurity:    Worry: Never true    Inability: Never true  . Transportation needs:    Medical: No    Non-medical: No  Tobacco Use  . Smoking status: Former Smoker    Packs/day: 1.00    Years: 40.00    Pack years: 40.00    Types: Cigarettes    Last attempt to quit: 11/22/2017    Years since quitting: 0.8  . Smokeless tobacco: Never Used  . Tobacco comment: quit 2 weeks ago  Substance and Sexual Activity  . Alcohol use: Never    Frequency: Never  . Drug use: Never  . Sexual activity: Not on file  Lifestyle  . Physical activity:    Days per week: 0 days    Minutes per session: Not on file  . Stress: To some extent  Relationships  . Social connections:    Talks on phone: Three times a week    Gets together: More than three times a week    Attends religious service: Not on file    Active  member of club or organization: Not on file    Attends meetings of clubs or organizations: Not on file    Relationship status: Not on file  . Intimate partner violence:    Fear of current or ex partner: No    Emotionally abused: No    Physically abused: No    Forced sexual activity: No  Other Topics Concern  . Not on file  Social History Narrative   Living at home with daughter.  Ambulates with a walker at baseline.    FAMILY HISTORY: Family History  Problem Relation Age of Onset  . Diabetes Mother   . Lung cancer Father     ALLERGIES:  is allergic to ibuprofen and penicillins.  MEDICATIONS:  Current Outpatient Medications  Medication Sig Dispense Refill  . albuterol (PROVENTIL  HFA;VENTOLIN HFA) 108 (90 Base) MCG/ACT inhaler Inhale 2 puffs into the lungs every 6 (six) hours as needed for wheezing or shortness of breath. 1 Inhaler 2  . ALPRAZolam (XANAX) 0.25 MG tablet Take 1 tablet (0.25 mg total) by mouth daily as needed for anxiety. 30 tablet 0  . amLODipine (NORVASC) 5 MG tablet Take 5 mg by mouth daily.  3  . aspirin 81 MG chewable tablet Chew 1 tablet by mouth daily.    Marland Kitchen atorvastatin (LIPITOR) 40 MG tablet Take 40 mg by mouth daily.  3  . dexamethasone (DECADRON) 4 MG tablet Take 2 tablets (8 mg total) by mouth daily. Start the day after chemotherapy for 2 days. 30 tablet 1  . folic acid (FOLVITE) 1 MG tablet Take 1 mg by mouth daily.  11  . hydroxychloroquine (PLAQUENIL) 200 MG tablet 1 tab twice a day, 90 days    . Insulin Glargine (BASAGLAR KWIKPEN) 100 UNIT/ML SOPN Inject 38 Units into the skin every morning.   12  . lidocaine-prilocaine (EMLA) cream Apply to affected area once 30 g 3  . lisinopril (PRINIVIL,ZESTRIL) 40 MG tablet Take 40 mg by mouth daily.  3  . metFORMIN (GLUCOPHAGE) 1000 MG tablet Take 1,000 mg by mouth 2 (two) times daily.  5  . metoprolol tartrate (LOPRESSOR) 25 MG tablet Take 25 mg by mouth 2 (two) times daily.  3  . mirtazapine (REMERON) 7.5 MG tablet Take 1 tablet (7.5 mg total) by mouth at bedtime. 30 tablet 3  . NARCAN 4 MG/0.1ML LIQD nasal spray kit CALL 911. ADMINISTER A SINGLE SPRAY OF NARCAN IN ONE NOSTRIL. REPEAT EVERY 3 MINUTES AS NEEDED IF NO OR MINIMAL RESPONSE.    Marland Kitchen omeprazole (PRILOSEC) 20 MG capsule Take 1 capsule (20 mg total) by mouth daily. 90 capsule 0  . ondansetron (ZOFRAN) 8 MG tablet Take 1 tablet (8 mg total) by mouth 2 (two) times daily as needed for refractory nausea / vomiting. Start on day 3 after chemo. 30 tablet 2  . oxyCODONE (ROXICODONE) 5 MG immediate release tablet Take 1 tablet (5 mg total) by mouth daily as needed for moderate pain or severe pain. 30 tablet 0  . prochlorperazine (COMPAZINE) 10 MG tablet  Take 1 tablet (10 mg total) by mouth every 6 (six) hours as needed (Nausea or vomiting). 30 tablet 1  . sucralfate (CARAFATE) 1 g tablet Take 1 tablet (1 g total) by mouth 3 (three) times daily. Dissolve in 3-4 tbsp warm water, swish and swallow 90 tablet 0  . triamterene-hydrochlorothiazide (DYAZIDE) 37.5-25 MG capsule Take 1 capsule by mouth daily.  3   No current facility-administered medications for this visit.  PHYSICAL EXAMINATION: ECOG PERFORMANCE STATUS: 1 - Symptomatic but completely ambulatory Vitals:   10/06/18 0944  BP: (!) 143/82  Pulse: 88  Temp: 97.9 F (36.6 C)   Filed Weights   10/06/18 0944  Weight: 196 lb 4 oz (89 kg)   Physical Exam Constitutional:      General: She is not in acute distress. HENT:     Head: Normocephalic and atraumatic.  Eyes:     General: No scleral icterus.    Pupils: Pupils are equal, round, and reactive to light.  Neck:     Musculoskeletal: Normal range of motion and neck supple.  Cardiovascular:     Rate and Rhythm: Normal rate and regular rhythm.     Heart sounds: Murmur present.  Pulmonary:     Effort: Pulmonary effort is normal. No respiratory distress.     Breath sounds: No wheezing.  Abdominal:     General: Bowel sounds are normal. There is no distension.     Palpations: Abdomen is soft. There is no mass.     Tenderness: There is no abdominal tenderness.  Musculoskeletal: Normal range of motion.        General: No deformity.  Skin:    General: Skin is warm and dry.     Findings: No erythema or rash.  Neurological:     Mental Status: She is alert and oriented to person, place, and time.     Cranial Nerves: No cranial nerve deficit.     Coordination: Coordination normal.  Psychiatric:        Behavior: Behavior normal.        Thought Content: Thought content normal.        LABORATORY DATA:  I have reviewed the data as listed Lab Results  Component Value Date   WBC 6.7 10/06/2018   HGB 8.6 (L) 10/06/2018    HCT 28.2 (L) 10/06/2018   MCV 94.0 10/06/2018   PLT 209 10/06/2018   Recent Labs    08/23/18 1006 09/15/18 0836 10/06/18 0910  NA 140 142 139  K 4.5 4.3 4.2  CL 107 112* 108  CO2 _0 GLUCOSE 221* 189* 175*  BUN 17 24* 17  CREATININE 1.13* 1.01* 0.95  CALCIUM 8.8* 8.3* 8.4*  GFRNONAA 51* 59* >60  GFRAA 59* >60 >60  PROT 7.5 7.2 7.5  ALBUMIN 3.4* 3.1* 3.0*  AST _1 ALT _2 ALKPHOS 88 81 90  BILITOT 0.4 0.4 0.4   Iron/TIBC/Ferritin/ %Sat    Component Value Date/Time   IRON 27 (L) 05/25/2018 1207   TIBC 241 (L) 05/25/2018 1207   FERRITIN 181 05/25/2018 1207   IRONPCTSAT 11 05/25/2018 1207   RADIOGRAPHIC STUDIES: I have personally reviewed the radiological images as listed and agreed with the findings in the report.  05/24/2018 PET scan  1. Highly hypermetabolic left upper lobe mass, maximum SUV 16.9, with chest wall invasion of the left third and fourth ribs and into the third intercostal space. 2. Separate pleural tumor deposit on the left at the T3-4 level medially near the neural foramen. 3. Hypermetabolic AP window lymph nodes. Probable left hilar lymph nodes confluent with the mass. Separate tumor nodules are noted in the left upper lobe. 4. Hypermetabolic sacrococcygeal junction without a well-defined lesion, probably from a fracture, less likely from early metastatic disease.   ASSESSMENT & PLAN:  1. Encounter for antineoplastic chemotherapy   2. Malignant neoplasm of lung, unspecified laterality, unspecified part of lung (  [place])   3. Port-A-Cath in place   4. Rheumatoid arthritis, involving unspecified site, unspecified rheumatoid factor presence ([place])   5. Anxiety   6. 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 Interim CT after 6 cycles of chemotherapy was independently reviewed by me and discussed with patient.  CT showed stable  slight left upper lobe cavitary mass  There is growth of mediastinal lymph node and 1 of the lung nodule. I have discussed case with patient's rheumatologist Dr. Meda Coffee.  Patient's rheumatoid arthritis appears to be well controlled.  No clinically active synovitis.  Dr. Meda Coffee and I agree on proceeding with immunotherapy and see how she does. Starting cycle 7, will add Keytruda to her chemotherapy regimen. She is going to proceed with cycle 7 carboplatin, Taxol and Keytruda on 10/12/2018. Rationale of adding immunotherapy and potential side effects discussed with patient and 2 daughters.  I discussed the mechanism of action and rationale of using immunotherapy.  The goal of therapy is palliative; and length of treatments are likely ongoing/based upon the results of the scans. Discussed the potential side effects of immunotherapy including but not limited to diarrhea; skin rash; respiratory failure, neurotoxicity, elevated LFTs/endocrine abnormalities etc.  #Neuropathy, chemotherapy-induced, grade 1.  Taxol was reduced to 135 mg/m.  Stable neuropathy symptoms.  Continue gabapentin  #Rheumatoid arthritis, off methotrexate.  Continue Plaquenil.  #Neoplasm related pain and chronic pain secondary to rheumatoid arthritis. Patient requesting pain medication refill.  Reports pain at posterior rib cage on the left side, and the bilateral  lower extremities.  Recommend continue current regimen with oxycodone 5 mg every 12 hours as needed. Patient's daughter Audelia Acton had called a few times to request pain medication to be refilled as patient is having worsening of pain.There seems to be discrepancy between the pain level patient tells me and what her daughter describes over the phone. Patient received last refill on 09/16/2018, 60 tablets.  Early to fill next prescription. We will refer patient to palliative care to establish care.  #Anxiety, continue Xanax and Remeron as instructed.. We spent sufficient time to  discuss many aspect of care, questions were answered to patient's satisfaction. Total face to face encounter time for this patient visit was 40 min. >50% of the time was  spent in counseling and coordination of care.    Return of visit: 3 weeks for cycle 8 carboplatin, Taxol, Keytruda. Orders Placed This Encounter  Procedures  . CBC with Differential/Platelet    Standing Status:   Standing    Number of Occurrences:   20    Standing Expiration Date:   10/08/2019  . Comprehensive metabolic panel    Standing Status:   Standing    Number of Occurrences:   20    Standing Expiration Date:   10/08/2019  . TSH    Standing Status:   Standing    Number of Occurrences:   20    Standing Expiration Date:   10/08/2019  . Ambulatory Referral to Palliative Care    Referral Priority:   Routine    Referral Type:   Consultation    Referral Reason:   Goals of Care    Referred to Provider:   Borders, Kirt Boys, NP    Number of Visits Requested:   1   Earlie Server, MD, PhD Hematology Oncology [facility] Pager- [phone] 10/07/2018

## 2018-10-07 NOTE — Telephone Encounter (Signed)
Daughter called reporting medicine she was given yesterday needs  A prior authorization and wants someone to call her regarding this.

## 2018-10-10 ENCOUNTER — Other Ambulatory Visit: Payer: Self-pay | Admitting: Oncology

## 2018-10-10 ENCOUNTER — Inpatient Hospital Stay: Payer: PRIVATE HEALTH INSURANCE

## 2018-10-11 NOTE — Telephone Encounter (Signed)
Patient no longer needs iron supplement, was d/c from her medication list

## 2018-10-12 ENCOUNTER — Other Ambulatory Visit: Payer: Self-pay | Admitting: Oncology

## 2018-10-12 ENCOUNTER — Inpatient Hospital Stay: Payer: PRIVATE HEALTH INSURANCE

## 2018-10-12 ENCOUNTER — Other Ambulatory Visit: Payer: Self-pay | Admitting: *Deleted

## 2018-10-12 ENCOUNTER — Telehealth: Payer: Self-pay

## 2018-10-12 VITALS — BP 121/71 | HR 80 | Temp 98.6°F | Resp 20 | Wt 196.0 lb

## 2018-10-12 DIAGNOSIS — Z5111 Encounter for antineoplastic chemotherapy: Secondary | ICD-10-CM

## 2018-10-12 DIAGNOSIS — C3490 Malignant neoplasm of unspecified part of unspecified bronchus or lung: Secondary | ICD-10-CM

## 2018-10-12 DIAGNOSIS — Z5112 Encounter for antineoplastic immunotherapy: Secondary | ICD-10-CM | POA: Diagnosis not present

## 2018-10-12 DIAGNOSIS — Z95828 Presence of other vascular implants and grafts: Secondary | ICD-10-CM

## 2018-10-12 LAB — TSH: TSH: 1.855 u[IU]/mL (ref 0.350–4.500)

## 2018-10-12 MED ORDER — SODIUM CHLORIDE 0.9 % IV SOLN
545.5000 mg | Freq: Once | INTRAVENOUS | Status: AC
  Administered 2018-10-12: 550 mg via INTRAVENOUS
  Filled 2018-10-12: qty 55

## 2018-10-12 MED ORDER — SODIUM CHLORIDE 0.9 % IV SOLN
Freq: Once | INTRAVENOUS | Status: AC
  Administered 2018-10-12: 10:00:00 via INTRAVENOUS
  Filled 2018-10-12: qty 250

## 2018-10-12 MED ORDER — SODIUM CHLORIDE 0.9% FLUSH
10.0000 mL | INTRAVENOUS | Status: DC | PRN
  Administered 2018-10-12: 10 mL
  Filled 2018-10-12: qty 10

## 2018-10-12 MED ORDER — DIPHENHYDRAMINE HCL 50 MG/ML IJ SOLN
50.0000 mg | Freq: Once | INTRAMUSCULAR | Status: AC
  Administered 2018-10-12: 50 mg via INTRAVENOUS
  Filled 2018-10-12: qty 1

## 2018-10-12 MED ORDER — PALONOSETRON HCL INJECTION 0.25 MG/5ML
0.2500 mg | Freq: Once | INTRAVENOUS | Status: AC
  Administered 2018-10-12: 0.25 mg via INTRAVENOUS
  Filled 2018-10-12: qty 5

## 2018-10-12 MED ORDER — HEPARIN SOD (PORK) LOCK FLUSH 100 UNIT/ML IV SOLN
500.0000 [IU] | Freq: Once | INTRAVENOUS | Status: AC | PRN
  Administered 2018-10-12: 500 [IU]
  Filled 2018-10-12: qty 5

## 2018-10-12 MED ORDER — FAMOTIDINE IN NACL 20-0.9 MG/50ML-% IV SOLN
20.0000 mg | Freq: Once | INTRAVENOUS | Status: AC
  Administered 2018-10-12: 20 mg via INTRAVENOUS
  Filled 2018-10-12: qty 50

## 2018-10-12 MED ORDER — SODIUM CHLORIDE 0.9 % IV SOLN
200.0000 mg | Freq: Once | INTRAVENOUS | Status: AC
  Administered 2018-10-12: 200 mg via INTRAVENOUS
  Filled 2018-10-12: qty 8

## 2018-10-12 MED ORDER — SODIUM CHLORIDE 0.9 % IV SOLN
Freq: Once | INTRAVENOUS | Status: AC
  Administered 2018-10-12: 10:00:00 via INTRAVENOUS
  Filled 2018-10-12: qty 5

## 2018-10-12 MED ORDER — SODIUM CHLORIDE 0.9 % IV SOLN
135.0000 mg/m2 | Freq: Once | INTRAVENOUS | Status: AC
  Administered 2018-10-12: 276 mg via INTRAVENOUS
  Filled 2018-10-12: qty 46

## 2018-10-12 MED ORDER — BUDESONIDE-FORMOTEROL FUMARATE 160-4.5 MCG/ACT IN AERO: 2.0000 | INHALATION_SPRAY | Freq: Two times a day (BID) | RESPIRATORY_TRACT | 2 refills | Status: DC

## 2018-10-12 MED ORDER — FERROUS SULFATE 325 (65 FE) MG PO TBEC: 325.0000 mg | DELAYED_RELEASE_TABLET | Freq: Two times a day (BID) | ORAL | 3 refills | Status: AC

## 2018-10-12 NOTE — Telephone Encounter (Signed)
Dr. Tasia Catchings wanted to discuss with Dr. Juanell Fairly about prescribing maintenance inhaler for Tamara Parrish (MRN [id number]) since she gets short of breath during the day. She has an albuterol inhaler that she uses but it doesn't seem to help long-term. Do you know if Dr. Juanell Fairly could prescribe a long-acting inhaler for this patient?

## 2018-10-12 NOTE — Telephone Encounter (Signed)
Per Dr. Mathis Fare response, symbicort 160 sent to patient's pharmacy. [place] (cancer center) will let patient know.

## 2018-10-12 NOTE — Telephone Encounter (Signed)
Can try symbicort 160, 2 puffs bid, rinse mouth after use. However I think that her dyspnea is from pulmonary fibrosis so not sure that the inhaler will help.

## 2018-10-26 ENCOUNTER — Emergency Department: Payer: Medicaid Other

## 2018-10-26 ENCOUNTER — Other Ambulatory Visit: Payer: Self-pay

## 2018-10-26 ENCOUNTER — Inpatient Hospital Stay
Admission: EM | Admit: 2018-10-26 | Discharge: 2018-10-28 | DRG: 812 | Disposition: A | Discharge status: Home Health | Payer: Medicaid Other | Attending: Internal Medicine | Admitting: Internal Medicine

## 2018-10-26 DIAGNOSIS — I10 Essential (primary) hypertension: Secondary | ICD-10-CM | POA: Diagnosis present

## 2018-10-26 DIAGNOSIS — Z886 Allergy status to analgesic agent status: Secondary | ICD-10-CM

## 2018-10-26 DIAGNOSIS — D6481 Anemia due to antineoplastic chemotherapy: Secondary | ICD-10-CM | POA: Diagnosis present

## 2018-10-26 DIAGNOSIS — R531 Weakness: Secondary | ICD-10-CM

## 2018-10-26 DIAGNOSIS — Z7982 Long term (current) use of aspirin: Secondary | ICD-10-CM | POA: Diagnosis not present

## 2018-10-26 DIAGNOSIS — F329 Major depressive disorder, single episode, unspecified: Secondary | ICD-10-CM | POA: Diagnosis present

## 2018-10-26 DIAGNOSIS — Z87891 Personal history of nicotine dependence: Secondary | ICD-10-CM | POA: Diagnosis not present

## 2018-10-26 DIAGNOSIS — Z66 Do not resuscitate: Secondary | ICD-10-CM | POA: Diagnosis present

## 2018-10-26 DIAGNOSIS — D638 Anemia in other chronic diseases classified elsewhere: Secondary | ICD-10-CM | POA: Diagnosis present

## 2018-10-26 DIAGNOSIS — N179 Acute kidney failure, unspecified: Secondary | ICD-10-CM | POA: Diagnosis present

## 2018-10-26 DIAGNOSIS — T451X5A Adverse effect of antineoplastic and immunosuppressive drugs, initial encounter: Secondary | ICD-10-CM | POA: Diagnosis present

## 2018-10-26 DIAGNOSIS — Z801 Family history of malignant neoplasm of trachea, bronchus and lung: Secondary | ICD-10-CM

## 2018-10-26 DIAGNOSIS — Z833 Family history of diabetes mellitus: Secondary | ICD-10-CM

## 2018-10-26 DIAGNOSIS — C3490 Malignant neoplasm of unspecified part of unspecified bronchus or lung: Secondary | ICD-10-CM | POA: Diagnosis present

## 2018-10-26 DIAGNOSIS — Z8701 Personal history of pneumonia (recurrent): Secondary | ICD-10-CM | POA: Diagnosis not present

## 2018-10-26 DIAGNOSIS — D630 Anemia in neoplastic disease: Secondary | ICD-10-CM | POA: Diagnosis present

## 2018-10-26 DIAGNOSIS — Z880 Allergy status to penicillin: Secondary | ICD-10-CM

## 2018-10-26 DIAGNOSIS — G8929 Other chronic pain: Secondary | ICD-10-CM | POA: Diagnosis present

## 2018-10-26 DIAGNOSIS — M069 Rheumatoid arthritis, unspecified: Secondary | ICD-10-CM | POA: Diagnosis present

## 2018-10-26 DIAGNOSIS — F419 Anxiety disorder, unspecified: Secondary | ICD-10-CM | POA: Diagnosis present

## 2018-10-26 DIAGNOSIS — E1140 Type 2 diabetes mellitus with diabetic neuropathy, unspecified: Secondary | ICD-10-CM | POA: Diagnosis present

## 2018-10-26 DIAGNOSIS — E785 Hyperlipidemia, unspecified: Secondary | ICD-10-CM | POA: Diagnosis present

## 2018-10-26 DIAGNOSIS — J9610 Chronic respiratory failure, unspecified whether with hypoxia or hypercapnia: Secondary | ICD-10-CM | POA: Diagnosis present

## 2018-10-26 DIAGNOSIS — Z79899 Other long term (current) drug therapy: Secondary | ICD-10-CM

## 2018-10-26 DIAGNOSIS — D649 Anemia, unspecified: Secondary | ICD-10-CM

## 2018-10-26 DIAGNOSIS — J449 Chronic obstructive pulmonary disease, unspecified: Secondary | ICD-10-CM | POA: Diagnosis present

## 2018-10-26 DIAGNOSIS — Z9981 Dependence on supplemental oxygen: Secondary | ICD-10-CM | POA: Diagnosis not present

## 2018-10-26 DIAGNOSIS — J8410 Pulmonary fibrosis, unspecified: Secondary | ICD-10-CM | POA: Diagnosis present

## 2018-10-26 DIAGNOSIS — E875 Hyperkalemia: Secondary | ICD-10-CM

## 2018-10-26 DIAGNOSIS — Z794 Long term (current) use of insulin: Secondary | ICD-10-CM | POA: Diagnosis not present

## 2018-10-26 LAB — CBC WITH DIFFERENTIAL/PLATELET
Abs Immature Granulocytes: 0.16 10*3/uL — ABNORMAL HIGH (ref 0.00–0.07)
BASOS PCT: 0 %
Basophils Absolute: 0 10*3/uL (ref 0.0–0.1)
Eosinophils Absolute: 0.1 10*3/uL (ref 0.0–0.5)
Eosinophils Relative: 1 %
HCT: 22 % — ABNORMAL LOW (ref 36.0–46.0)
Hemoglobin: 6.7 g/dL — ABNORMAL LOW (ref 12.0–15.0)
Immature Granulocytes: 2 %
Lymphocytes Relative: 6 %
Lymphs Abs: 0.5 10*3/uL — ABNORMAL LOW (ref 0.7–4.0)
MCH: 29.9 pg (ref 26.0–34.0)
MCHC: 30.5 g/dL (ref 30.0–36.0)
MCV: 98.2 fL (ref 80.0–100.0)
Monocytes Absolute: 0.4 10*3/uL (ref 0.1–1.0)
Monocytes Relative: 5 %
NRBC: 0.6 % — AB (ref 0.0–0.2)
Neutro Abs: 7.8 10*3/uL — ABNORMAL HIGH (ref 1.7–7.7)
Neutrophils Relative %: 86 %
Platelets: 325 10*3/uL (ref 150–400)
RBC: 2.24 MIL/uL — AB (ref 3.87–5.11)
RDW: 17 % — ABNORMAL HIGH (ref 11.5–15.5)
WBC: 8.9 10*3/uL (ref 4.0–10.5)

## 2018-10-26 LAB — BASIC METABOLIC PANEL
Anion gap: 12 (ref 5–15)
BUN: 45 mg/dL — ABNORMAL HIGH (ref 8–23)
CO2: 19 mmol/L — ABNORMAL LOW (ref 22–32)
Calcium: 8.1 mg/dL — ABNORMAL LOW (ref 8.9–10.3)
Chloride: 105 mmol/L (ref 98–111)
Creatinine, Ser: 1.86 mg/dL — ABNORMAL HIGH (ref 0.44–1.00)
GFR calc Af Amer: 33 mL/min — ABNORMAL LOW (ref >60)
GFR calc non Af Amer: 28 mL/min — ABNORMAL LOW (ref >60)
Glucose, Bld: 132 mg/dL — ABNORMAL HIGH (ref 70–99)
POTASSIUM: 6.3 mmol/L — AB (ref 3.5–5.1)
Sodium: 136 mmol/L (ref 135–145)

## 2018-10-26 LAB — GLUCOSE, CAPILLARY: Glucose-Capillary: 119 mg/dL — ABNORMAL HIGH (ref 70–99)

## 2018-10-26 LAB — HEMOGLOBIN A1C
Hgb A1c MFr Bld: 6.7 % — ABNORMAL HIGH (ref 4.8–5.6)
Mean Plasma Glucose: 145.59 mg/dL

## 2018-10-26 LAB — ALBUMIN: Albumin: 2.4 g/dL — ABNORMAL LOW (ref 3.5–5.0)

## 2018-10-26 LAB — POTASSIUM: POTASSIUM: 5 mmol/L (ref 3.5–5.1)

## 2018-10-26 LAB — TROPONIN I: Troponin I: <0.03 ng/mL (ref <0.03)

## 2018-10-26 MED ORDER — ONDANSETRON HCL 4 MG PO TABS: 4.0000 mg | ORAL_TABLET | Freq: Four times a day (QID) | ORAL | Status: DC | PRN

## 2018-10-26 MED ORDER — DEXTROSE 50 % IV SOLN
1.0000 | Freq: Once | INTRAVENOUS | Status: AC
  Administered 2018-10-26: 50 mL via INTRAVENOUS
  Filled 2018-10-26: qty 50

## 2018-10-26 MED ORDER — ACETAMINOPHEN 325 MG PO TABS
650.0000 mg | ORAL_TABLET | Freq: Four times a day (QID) | ORAL | Status: DC | PRN
  Administered 2018-10-27 – 2018-10-28 (×3): 650 mg via ORAL
  Filled 2018-10-26 (×3): qty 2

## 2018-10-26 MED ORDER — ALBUTEROL SULFATE (2.5 MG/3ML) 0.083% IN NEBU: 2.5000 mg | INHALATION_SOLUTION | RESPIRATORY_TRACT | Status: DC | PRN

## 2018-10-26 MED ORDER — ATORVASTATIN CALCIUM 20 MG PO TABS
40.0000 mg | ORAL_TABLET | Freq: Every day | ORAL | Status: DC
  Administered 2018-10-27 – 2018-10-28 (×2): 40 mg via ORAL
  Filled 2018-10-26 (×2): qty 2

## 2018-10-26 MED ORDER — OXYCODONE HCL 5 MG PO TABS
10.0000 mg | ORAL_TABLET | Freq: Four times a day (QID) | ORAL | Status: DC | PRN
  Administered 2018-10-26 – 2018-10-28 (×6): 10 mg via ORAL
  Filled 2018-10-26 (×6): qty 2

## 2018-10-26 MED ORDER — SODIUM CHLORIDE 0.9 % IV SOLN
Freq: Once | INTRAVENOUS | Status: AC
  Administered 2018-10-26: 16:00:00 via INTRAVENOUS

## 2018-10-26 MED ORDER — INSULIN ASPART 100 UNIT/ML ~~LOC~~ SOLN
0.0000 [IU] | Freq: Three times a day (TID) | SUBCUTANEOUS | Status: DC
  Administered 2018-10-27: 13:00:00 1 [IU] via SUBCUTANEOUS
  Administered 2018-10-28: 13:00:00 2 [IU] via SUBCUTANEOUS
  Filled 2018-10-26 (×2): qty 1

## 2018-10-26 MED ORDER — SODIUM POLYSTYRENE SULFONATE 15 GM/60ML PO SUSP
30.0000 g | Freq: Once | ORAL | Status: AC
  Administered 2018-10-26: 30 g via ORAL
  Filled 2018-10-26: qty 120

## 2018-10-26 MED ORDER — PANTOPRAZOLE SODIUM 40 MG PO TBEC
40.0000 mg | DELAYED_RELEASE_TABLET | Freq: Every day | ORAL | Status: DC
  Administered 2018-10-27 – 2018-10-28 (×2): 40 mg via ORAL
  Filled 2018-10-26 (×2): qty 1

## 2018-10-26 MED ORDER — INSULIN ASPART 100 UNIT/ML ~~LOC~~ SOLN: 0.0000 [IU] | Freq: Every day | SUBCUTANEOUS | Status: DC

## 2018-10-26 MED ORDER — INSULIN ASPART 100 UNIT/ML ~~LOC~~ SOLN
6.0000 [IU] | Freq: Once | SUBCUTANEOUS | Status: AC
  Administered 2018-10-26: 6 [IU] via INTRAVENOUS
  Filled 2018-10-26: qty 1

## 2018-10-26 MED ORDER — OXYCODONE HCL 5 MG PO TABS: 5.0000 mg | ORAL_TABLET | Freq: Every day | ORAL | Status: DC | PRN

## 2018-10-26 MED ORDER — ACETAMINOPHEN 650 MG RE SUPP: 650.0000 mg | Freq: Four times a day (QID) | RECTAL | Status: DC | PRN

## 2018-10-26 MED ORDER — SUCRALFATE 1 G PO TABS
1.0000 g | ORAL_TABLET | Freq: Three times a day (TID) | ORAL | Status: DC
  Administered 2018-10-26 – 2018-10-28 (×6): 1 g via ORAL
  Filled 2018-10-26 (×6): qty 1

## 2018-10-26 MED ORDER — MIRTAZAPINE 15 MG PO TABS
7.5000 mg | ORAL_TABLET | Freq: Every day | ORAL | Status: DC
  Administered 2018-10-26 – 2018-10-27 (×2): 7.5 mg via ORAL
  Filled 2018-10-26 (×2): qty 1

## 2018-10-26 MED ORDER — MOMETASONE FURO-FORMOTEROL FUM 200-5 MCG/ACT IN AERO
2.0000 | INHALATION_SPRAY | Freq: Two times a day (BID) | RESPIRATORY_TRACT | Status: DC
  Administered 2018-10-27 – 2018-10-28 (×2): 2 via RESPIRATORY_TRACT
  Filled 2018-10-26: qty 8.8

## 2018-10-26 MED ORDER — LIDOCAINE-PRILOCAINE 2.5-2.5 % EX CREA
TOPICAL_CREAM | CUTANEOUS | Status: AC
  Filled 2018-10-26: qty 5

## 2018-10-26 MED ORDER — LIDOCAINE-PRILOCAINE 2.5-2.5 % EX CREA
TOPICAL_CREAM | Freq: Once | CUTANEOUS | Status: AC
  Administered 2018-10-26: 16:00:00 via TOPICAL

## 2018-10-26 MED ORDER — CALCIUM GLUCONATE-NACL 1-0.675 GM/50ML-% IV SOLN
1.0000 g | Freq: Once | INTRAVENOUS | Status: AC
  Administered 2018-10-26: 1000 mg via INTRAVENOUS
  Filled 2018-10-26: qty 50

## 2018-10-26 MED ORDER — POLYETHYLENE GLYCOL 3350 17 G PO PACK: 17.0000 g | PACK | Freq: Every day | ORAL | Status: DC | PRN

## 2018-10-26 MED ORDER — HYDROXYCHLOROQUINE SULFATE 200 MG PO TABS
200.0000 mg | ORAL_TABLET | Freq: Two times a day (BID) | ORAL | Status: DC
  Administered 2018-10-26 – 2018-10-28 (×4): 200 mg via ORAL
  Filled 2018-10-26 (×5): qty 1

## 2018-10-26 MED ORDER — INSULIN GLARGINE 100 UNIT/ML ~~LOC~~ SOLN
38.0000 [IU] | Freq: Every morning | SUBCUTANEOUS | Status: DC
  Filled 2018-10-26: qty 0.38

## 2018-10-26 MED ORDER — FOLIC ACID 1 MG PO TABS
1.0000 mg | ORAL_TABLET | Freq: Every day | ORAL | Status: DC
  Administered 2018-10-27 – 2018-10-28 (×2): 1 mg via ORAL
  Filled 2018-10-26 (×2): qty 1

## 2018-10-26 MED ORDER — ONDANSETRON HCL 4 MG/2ML IJ SOLN: 4.0000 mg | Freq: Four times a day (QID) | INTRAMUSCULAR | Status: DC | PRN

## 2018-10-26 MED ORDER — METOPROLOL TARTRATE 25 MG PO TABS
25.0000 mg | ORAL_TABLET | Freq: Two times a day (BID) | ORAL | Status: DC
  Administered 2018-10-26 – 2018-10-28 (×4): 25 mg via ORAL
  Filled 2018-10-26 (×4): qty 1

## 2018-10-26 MED ORDER — SODIUM CHLORIDE 0.45 % IV SOLN
INTRAVENOUS | Status: DC
  Administered 2018-10-26 – 2018-10-27 (×2): via INTRAVENOUS

## 2018-10-26 MED ORDER — SODIUM CHLORIDE 0.9 % IV SOLN: 1.0000 g | Freq: Once | INTRAVENOUS | Status: DC

## 2018-10-26 MED ORDER — SODIUM CHLORIDE 0.9 % IV SOLN: 10.0000 mL/h | Freq: Once | INTRAVENOUS | Status: DC

## 2018-10-26 MED ORDER — ASPIRIN 81 MG PO CHEW
81.0000 mg | CHEWABLE_TABLET | Freq: Every day | ORAL | Status: DC
  Administered 2018-10-27 – 2018-10-28 (×2): 81 mg via ORAL
  Filled 2018-10-26 (×2): qty 1

## 2018-10-26 MED ORDER — SODIUM CHLORIDE 0.9% FLUSH
3.0000 mL | Freq: Two times a day (BID) | INTRAVENOUS | Status: DC
  Administered 2018-10-26 – 2018-10-28 (×3): 3 mL via INTRAVENOUS

## 2018-10-26 MED ORDER — HYDROCOD POLST-CPM POLST ER 10-8 MG/5ML PO SUER
5.0000 mL | Freq: Two times a day (BID) | ORAL | Status: DC | PRN
  Administered 2018-10-27 – 2018-10-28 (×2): 5 mL via ORAL
  Filled 2018-10-26 (×2): qty 5

## 2018-10-26 MED ORDER — ALPRAZOLAM 0.25 MG PO TABS: 0.2500 mg | ORAL_TABLET | Freq: Every day | ORAL | Status: DC | PRN

## 2018-10-26 NOTE — Progress Notes (Signed)
After arrival on the floor patient decided to change her CODE STATUS from DNR/DNI to partial code.  Okay for resuscitation but does not want to be intubated or placed on ventilator.

## 2018-10-26 NOTE — ED Notes (Signed)
Admit doctor at bedside

## 2018-10-26 NOTE — ED Triage Notes (Signed)
Increases sob patient from home o2 dependant. Hx of lung ca, last chemo 2 weeks ago.

## 2018-10-26 NOTE — ED Notes (Signed)
Consent for blood signed

## 2018-10-26 NOTE — ED Provider Notes (Signed)
[facility] Emergency Department Provider Note   ____________________________________________   I have reviewed the triage vital signs and the nursing notes.   HISTORY  Chief Complaint Weakness  History limited by: Not Limited   HPI Tamara Parrish is a 63 y.o. female who presents to the emergency department today with concerns for weakness and dizziness.  The patient states the symptoms have been gradually getting worse over the past week.  She started a new medication for her cancer two weeks ago. She does wonder if the weakness is related to the medication. The patient has felt dizziness and shortness of breath with ambulation. States she has been anemic in the past and required a transfusion a couple of months ago. The patient denies any fevers.     Per medical record review patient has a history of lung cancer.   Past Medical History:  Diagnosis Date  . Cancer ([place])    lung ca DX 2019  . Collagen vascular disease (HCC)    RA  . DM2 (diabetes mellitus, type 2) ([place])   . Dyspnea   . Hyperlipemia   . Hypertension   . Osteomyelitis ([place])   . Pneumonia   . RA (rheumatoid arthritis) ([place])   . Rheumatoid arthritis [facility])     Patient Active Problem List   Diagnosis Date Noted  . Encounter for antineoplastic chemotherapy 07/12/2018  . Rheumatoid arthritis ([place]) 07/12/2018  . Thrombocytosis ([place]) 07/12/2018  . Goals of care, counseling/discussion 05/25/2018  . Malignant neoplasm of lung ([place]) 05/25/2018  . Recurrent pneumonia 02/21/2018  . PNA (pneumonia) 02/07/2018  . Elevated erythrocyte sedimentation rate 12/16/2017  . Dry cough 08/19/2017  . Rheumatoid nodule of multiple sites ([place]) 08/19/2017  . Polyneuropathy associated with underlying disease ([place]) 05/17/2017  . Seasonal allergic rhinitis due to pollen 05/17/2017  . Essential hypertension 10/09/2016  . Pure hypercholesterolemia 10/09/2016  . Rheumatoid arthritis, seropositive ([place])  03/27/2016  . Polyarthralgia 03/18/2016  . Diabetes mellitus type 2, uncomplicated ([place]) 46/27/0350  . Allergic rhinitis 05/18/2014  . Microalbuminuria 04/18/2014    Past Surgical History:  Procedure Laterality Date  . ENDOBRONCHIAL ULTRASOUND N/A 05/13/2018   Procedure: ENDOBRONCHIAL ULTRASOUND;  Surgeon: [name], MD;  Location: [facility] ORS;  Service: Pulmonary;  Laterality: N/A;  . PORTA CATH INSERTION N/A 05/27/2018   Procedure: PORTA CATH INSERTION;  Surgeon: Katha Cabal, MD;  Location: [place] CV LAB;  Service: Cardiovascular;  Laterality: N/A;  . TOE SURGERY      Prior to Admission medications   Medication Sig Start Date End Date Taking? Authorizing Provider  albuterol (PROVENTIL HFA;VENTOLIN HFA) 108 (90 Base) MCG/ACT inhaler Inhale 2 puffs into the lungs every 6 (six) hours as needed for wheezing or shortness of breath. 08/23/18   Earlie Server, MD  ALPRAZolam Duanne Moron) 0.25 MG tablet Take 1 tablet (0.25 mg total) by mouth daily as needed for anxiety. 08/23/18   Earlie Server, MD  amLODipine (NORVASC) 5 MG tablet Take 5 mg by mouth daily. 01/20/18   [provider]  aspirin 81 MG chewable tablet Chew 1 tablet by mouth daily.    [provider]  atorvastatin (LIPITOR) 40 MG tablet Take 40 mg by mouth daily. 01/20/18   [provider]  budesonide-formoterol (SYMBICORT) 160-4.5 MCG/ACT inhaler Inhale 2 puffs into the lungs 2 (two) times daily. 10/12/18   [name], MD  dexamethasone (DECADRON) 4 MG tablet Take 2 tablets (8 mg total) by mouth daily. Start the day after chemotherapy for  2 days. 05/25/18   Earlie Server, MD  ferrous sulfate 325 (65 FE) MG EC tablet Take 1 tablet (325 mg total) by mouth 2 (two) times daily with a meal. 10/12/18   Earlie Server, MD  folic acid (FOLVITE) 1 MG tablet Take 1 mg by mouth daily. 01/20/18   [provider]  hydroxychloroquine (PLAQUENIL) 200 MG tablet 1 tab twice a day, 90 days 05/31/18   [provider]  Insulin Glargine (BASAGLAR KWIKPEN) 100 UNIT/ML SOPN Inject 38 Units into the skin every morning.  01/04/18   [provider]  lidocaine-prilocaine (EMLA) cream Apply to affected area once 08/23/18   Earlie Server, MD  lisinopril (PRINIVIL,ZESTRIL) 40 MG tablet Take 40 mg by mouth daily. 01/20/18   [provider]  metFORMIN (GLUCOPHAGE) 1000 MG tablet Take 1,000 mg by mouth 2 (two) times daily. 01/20/18   [provider]  metoprolol tartrate (LOPRESSOR) 25 MG tablet Take 25 mg by mouth 2 (two) times daily. 01/20/18   [provider]  mirtazapine (REMERON) 7.5 MG tablet Take 1 tablet (7.5 mg total) by mouth at bedtime. 08/23/18   Earlie Server, MD  NARCAN 4 MG/0.1ML LIQD nasal spray kit CALL 911. ADMINISTER A SINGLE SPRAY OF NARCAN IN ONE NOSTRIL. REPEAT EVERY 3 MINUTES AS NEEDED IF NO OR MINIMAL RESPONSE. 08/03/18   [provider]  omeprazole (PRILOSEC) 20 MG capsule Take 1 capsule (20 mg total) by mouth daily. 07/12/18   Earlie Server, MD  ondansetron (ZOFRAN) 8 MG tablet Take 1 tablet (8 mg total) by mouth 2 (two) times daily as needed for refractory nausea / vomiting. Start on day 3 after chemo. 08/23/18   Earlie Server, MD  oxyCODONE (ROXICODONE) 5 MG immediate release tablet Take 1 tablet (5 mg total) by mouth daily as needed for moderate pain or severe pain. 10/06/18   Earlie Server, MD  prochlorperazine (COMPAZINE) 10 MG tablet Take 1 tablet (10 mg total) by mouth every 6 (six) hours as needed (Nausea or vomiting). 05/25/18   Earlie Server, MD  sucralfate (CARAFATE) 1 g tablet Take 1 tablet (1 g total) by mouth 3 (three) times daily. Dissolve in 3-4 tbsp warm water, swish and swallow 07/18/18   Noreene Filbert, MD  triamterene-hydrochlorothiazide (DYAZIDE) 37.5-25 MG capsule Take 1 capsule by mouth daily. 01/20/18   [provider]    Allergies Ibuprofen and Penicillins  Family History  Problem Relation Age of Onset  . Diabetes Mother   . Lung cancer  Father     Social History Social History   Tobacco Use  . Smoking status: Former Smoker    Packs/day: 1.00    Years: 40.00    Pack years: 40.00    Types: Cigarettes    Last attempt to quit: 11/22/2017    Years since quitting: 0.9  . Smokeless tobacco: Never Used  . Tobacco comment: quit 2 weeks ago  Substance Use Topics  . Alcohol use: Never    Frequency: Never  . Drug use: Never    Review of Systems Constitutional: No fever/chills Eyes: No visual changes. ENT: No sore throat. Cardiovascular: Denies chest pain. Respiratory: Positive for shortness of breath. Gastrointestinal: No abdominal pain.  No nausea, no vomiting.  No diarrhea.   Genitourinary: Negative for dysuria. Musculoskeletal: Negative for back pain. Skin: Negative for rash. Neurological: Negative for headaches, focal weakness or numbness.  ____________________________________________   PHYSICAL EXAM:  VITAL SIGNS: ED Triage Vitals  Enc Vitals Group     BP  10/26/18 1407 119/69     Pulse Rate 10/26/18 1404 95     Resp 10/26/18 1404 (!) 30     Temp 10/26/18 1404 98.6 F (37 C)     Temp Source 10/26/18 1404 Oral     SpO2 10/26/18 1404 97 %     Weight 10/26/18 1405 195 lb 15.8 oz (88.9 kg)     Height 10/26/18 1405 '5\' 6"'$  (1.676 m)     Head Circumference --      Peak Flow --      Pain Score 10/26/18 1405 0   Constitutional: Alert and oriented.  Eyes: Conjunctivae are normal.  ENT      Head: Normocephalic and atraumatic.      Nose: No congestion/rhinnorhea.      Mouth/Throat: Mucous membranes are moist.      Neck: No stridor. Hematological/Lymphatic/Immunilogical: No cervical lymphadenopathy. Cardiovascular: Normal rate, regular rhythm.  No murmurs, rubs, or gallops.  Respiratory: Normal respiratory effort without tachypnea nor retractions. Breath sounds are clear and equal bilaterally. No wheezes/rales/rhonchi. Gastrointestinal: Soft and non tender. No rebound. No guarding.  Genitourinary:  Deferred Musculoskeletal: Normal range of motion in all extremities. No lower extremity edema. Neurologic:  Normal speech and language. No gross focal neurologic deficits are appreciated.  Skin:  Skin is warm, dry and intact. No rash noted. Psychiatric: Mood and affect are normal. Speech and behavior are normal. Patient exhibits appropriate insight and judgment.  ____________________________________________    LABS (pertinent positives/negatives)  Trop <0.03 CBC wbc 8.9, hgb 6.7, plt 325 BMP na 136, k 6.3, glu 132, cr 1.86  ____________________________________________   EKG  I, Nance Pear, attending physician, personally viewed and interpreted this EKG  EKG Time: 1623 Rate: 88 Rhythm: sinus rhythm Axis: normal Intervals: qtc 446 QRS: narrow ST changes: no st elevation Impression: normal ekg   ____________________________________________    RADIOLOGY  CXR No acute findings  ____________________________________________   PROCEDURES  Procedures  CRITICAL CARE Performed by: Nance Pear   Total critical care time: 30 minutes  Critical care time was exclusive of separately billable procedures and treating other patients.  Critical care was necessary to treat or prevent imminent or life-threatening deterioration.  Critical care was time spent personally by me on the following activities: development of treatment plan with patient and/or surrogate as well as nursing, discussions with consultants, evaluation of patient's response to treatment, examination of patient, obtaining history from patient or surrogate, ordering and performing treatments and interventions, ordering and review of laboratory studies, ordering and review of radiographic studies, pulse oximetry and re-evaluation of patient's condition.  ____________________________________________   INITIAL IMPRESSION / ASSESSMENT AND PLAN / ED COURSE  Pertinent labs & imaging results that were  available during my care of the patient were reviewed by me and considered in my medical decision making (see chart for details).   Patient presented to the emergency department today because of concerns for weakness and dizziness over the past week.  Patient is an oncology patient started a new medication roughly 2 weeks ago.  Differential would include medication side effect, anemia of which the patient has history, infection, pneumonia etc. etiologies.  Patient apparently was notable for a hemoglobin of 6.7.  I discussed this with the patient.  Patient for blood transfusion.  Initially patient was found to be high.  Anemic.  Patient will be given medication to help manage this. Patient will be admitted to the hospitalist service.   ____________________________________________   FINAL CLINICAL IMPRESSION(S) / ED  DIAGNOSES  Final diagnoses:  Symptomatic anemia  Weakness  Hyperkalemia     Note: This dictation was prepared with Dragon dictation. Any transcriptional errors that result from this process are unintentional     Nance Pear, MD 10/26/18 [id number]

## 2018-10-26 NOTE — H&P (Signed)
[place] [facility] [place] NAME: Otie Headlee    MR#:  [id number]  DATE OF BIRTH:  1956/01/20  DATE OF ADMISSION:  10/26/2018  PRIMARY CARE PHYSICIAN: Glendon Axe, MD   REQUESTING/REFERRING PHYSICIAN: Dr. Archie Balboa  CHIEF COMPLAINT:   Chief Complaint  Patient presents with  . Shortness of Breath    HISTORY OF PRESENT ILLNESS:  Laneisha Mino  is a 63 y.o. female with a known history of stage IV lung cancer on immunotherapy, rheumatoid arthritis, hypertension, COPD/pulmonary fibrosis with chronic respiratory failure on 2 L oxygen at home presents to the emergency room due to fatigue and shortness of breath.  She is found to have anemia with hemoglobin 6.7 and also hyperkalemia and acute kidney injury.  Patient is being admitted to the hospitalist service.  No history of GI bleed.  No bright red blood per rectum or melena. She had immunotherapy 2 weeks back and since then has felt extremely fatigued.  Overall she has declined functionally over the past few months. She has had decreased appetite.  But has not lost any weight.  PAST MEDICAL HISTORY:   Past Medical History:  Diagnosis Date  . Cancer ([place])    lung ca DX 2019  . Collagen vascular disease (HCC)    RA  . DM2 (diabetes mellitus, type 2) ([place])   . Dyspnea   . Hyperlipemia   . Hypertension   . Osteomyelitis ([place])   . Pneumonia   . RA (rheumatoid arthritis) ([place])   . Rheumatoid arthritis ([place])     PAST SURGICAL HISTORY:   Past Surgical History:  Procedure Laterality Date  . ENDOBRONCHIAL ULTRASOUND N/A 05/13/2018   Procedure: ENDOBRONCHIAL ULTRASOUND;  Surgeon: [name], MD;  Location: [facility] ORS;  Service: Pulmonary;  Laterality: N/A;  . PORTA CATH INSERTION N/A 05/27/2018   Procedure: PORTA CATH INSERTION;  Surgeon: Katha Cabal, MD;  Location: [place] [facility] LAB;  Service: Cardiovascular;  Laterality: N/A;  . TOE SURGERY      SOCIAL HISTORY:   Social  History   Tobacco Use  . Smoking status: Former Smoker    Packs/day: 1.00    Years: 40.00    Pack years: 40.00    Types: Cigarettes    Last attempt to quit: 11/22/2017    Years since quitting: 0.9  . Smokeless tobacco: Never Used  . Tobacco comment: quit 2 weeks ago  Substance Use Topics  . Alcohol use: Never    Frequency: Never    FAMILY HISTORY:   Family History  Problem Relation Age of Onset  . Diabetes Mother   . Lung cancer Father     DRUG ALLERGIES:   Allergies  Allergen Reactions  . Ibuprofen Other (See Comments)    Is not supposed to take because of her kidneys.  . Penicillins Other (See Comments)    Has patient had a PCN reaction causing immediate rash, facial/tongue/throat swelling, SOB or lightheadedness with hypotension: Unknown Has patient had a PCN reaction causing severe rash involving mucus membranes or skin necrosis: Unknown Has patient had a PCN reaction that required hospitalization: Unknown Has patient had a PCN reaction occurring within the last 10 years: Unknown If all of the above answers are "NO", then may proceed with Cephalosporin use.    REVIEW OF SYSTEMS:   Review of Systems  Constitutional: Positive for malaise/fatigue. Negative for chills, fever and weight loss.  HENT: Negative for hearing loss and nosebleeds.   Eyes: Negative for  blurred vision, double vision and pain.  Respiratory: Positive for cough (chronic) and shortness of breath (chronic). Negative for hemoptysis, sputum production and wheezing.   Cardiovascular: Positive for leg swelling. Negative for chest pain, palpitations and orthopnea.  Gastrointestinal: Negative for abdominal pain, constipation, diarrhea, nausea and vomiting.  Genitourinary: Negative for dysuria and hematuria.  Musculoskeletal: Negative for back pain, falls and myalgias.  Skin: Negative for rash.  Neurological: Negative for dizziness, tremors, sensory change, speech change, focal weakness, seizures and  headaches.  Endo/Heme/Allergies: Does not bruise/bleed easily.  Psychiatric/Behavioral: Negative for depression and memory loss. The patient is not nervous/anxious.     MEDICATIONS AT HOME:   Prior to Admission medications   Medication Sig Start Date End Date Taking? Authorizing Provider  ALPRAZolam (XANAX) 0.25 MG tablet Take 1 tablet (0.25 mg total) by mouth daily as needed for anxiety. 08/23/18  Yes Earlie Server, MD  amLODipine (NORVASC) 5 MG tablet Take 5 mg by mouth daily. 01/20/18  Yes [provider]  aspirin 81 MG chewable tablet Chew 1 tablet by mouth daily.   Yes [provider]  atorvastatin (LIPITOR) 40 MG tablet Take 40 mg by mouth daily. 01/20/18  Yes [provider]  budesonide-formoterol (SYMBICORT) 160-4.5 MCG/ACT inhaler Inhale 2 puffs into the lungs 2 (two) times daily. 10/12/18  Yes [name], MD  dexamethasone (DECADRON) 4 MG tablet Take 2 tablets (8 mg total) by mouth daily. Start the day after chemotherapy for 2 days. 05/25/18  Yes Earlie Server, MD  ferrous sulfate 325 (65 FE) MG EC tablet Take 1 tablet (325 mg total) by mouth 2 (two) times daily with a meal. 10/12/18  Yes Earlie Server, MD  folic acid (FOLVITE) 1 MG tablet Take 1 mg by mouth daily. 01/20/18  Yes [provider]  hydroxychloroquine (PLAQUENIL) 200 MG tablet 1 tab twice a day, 90 days 05/31/18  Yes [provider]  Insulin Glargine (BASAGLAR KWIKPEN) 100 UNIT/ML SOPN Inject 38 Units into the skin every morning.  01/04/18  Yes [provider]  lisinopril (PRINIVIL,ZESTRIL) 40 MG tablet Take 40 mg by mouth daily. 01/20/18  Yes [provider]  metFORMIN (GLUCOPHAGE) 1000 MG tablet Take 1,000 mg by mouth 2 (two) times daily. 01/20/18  Yes [provider]  metoprolol tartrate (LOPRESSOR) 25 MG tablet Take 25 mg by mouth 2 (two) times daily. 01/20/18  Yes [provider]  mirtazapine (REMERON) 7.5 MG tablet Take 1 tablet (7.5 mg total) by  mouth at bedtime. 08/23/18  Yes Earlie Server, MD  omeprazole (PRILOSEC) 20 MG capsule Take 1 capsule (20 mg total) by mouth daily. 07/12/18  Yes Earlie Server, MD  sucralfate (CARAFATE) 1 g tablet Take 1 tablet (1 g total) by mouth 3 (three) times daily. Dissolve in 3-4 tbsp warm water, swish and swallow 07/18/18  Yes Chrystal, Eulas Post, MD  triamterene-hydrochlorothiazide (DYAZIDE) 37.5-25 MG capsule Take 1 capsule by mouth daily. 01/20/18  Yes [provider]  albuterol (PROVENTIL HFA;VENTOLIN HFA) 108 (90 Base) MCG/ACT inhaler Inhale 2 puffs into the lungs every 6 (six) hours as needed for wheezing or shortness of breath. 08/23/18   Earlie Server, MD  lidocaine-prilocaine (EMLA) cream Apply to affected area once 08/23/18   Earlie Server, MD  [facility] 4 MG/0.1ML LIQD nasal spray kit CALL 911. ADMINISTER A SINGLE SPRAY OF NARCAN IN ONE NOSTRIL. REPEAT EVERY 3 MINUTES AS NEEDED IF NO OR MINIMAL RESPONSE. 08/03/18   [provider]  ondansetron (ZOFRAN) 8 MG tablet Take 1 tablet (  8 mg total) by mouth 2 (two) times daily as needed for refractory nausea / vomiting. Start on day 3 after chemo. 08/23/18   Earlie Server, MD  oxyCODONE (ROXICODONE) 5 MG immediate release tablet Take 1 tablet (5 mg total) by mouth daily as needed for moderate pain or severe pain. 10/06/18   Earlie Server, MD  prochlorperazine (COMPAZINE) 10 MG tablet Take 1 tablet (10 mg total) by mouth every 6 (six) hours as needed (Nausea or vomiting). 05/25/18   Earlie Server, MD     VITAL SIGNS:  Blood pressure 110/67, pulse 88, temperature 98.6 F (37 C), temperature source Oral, resp. rate (!) 24, height '5\' 6"'$  (1.676 m), weight 88.9 kg, SpO2 97 %.  PHYSICAL EXAMINATION:  Physical Exam  GENERAL:  63 y.o.-year-old patient lying in the bed  EYES: Pupils equal, round, reactive to light and accommodation. No scleral icterus. Extraocular muscles intact.  HEENT: Head atraumatic, normocephalic. Oropharynx and nasopharynx clear. No oropharyngeal erythema, moist  oral mucosa  NECK:  Supple, no jugular venous distention. No thyroid enlargement, no tenderness.  LUNGS: Normal breath sounds bilaterally, no wheezing, rales, rhonchi. No use of accessory muscles of respiration.  CARDIOVASCULAR: S1, S2 normal. No murmurs, rubs, or gallops.  ABDOMEN: Soft, nontender, nondistended. Bowel sounds present. No organomegaly or mass.  EXTREMITIES: No  cyanosis, or clubbing. + 2 pedal & radial pulses b/l.  Bilateral lower extremity edema NEUROLOGIC: Cranial nerves II through XII are intact. No focal Motor or sensory deficits appreciated b/l PSYCHIATRIC: The patient is alert and oriented x 3. Good affect.  SKIN: No obvious rash, lesion, or ulcer.   LABORATORY PANEL:   CBC Recent Labs  Lab 10/26/18 1521  WBC 8.9  HGB 6.7*  HCT 22.0*  PLT 325   ------------------------------------------------------------------------------------------------------------------  Chemistries  Recent Labs  Lab 10/26/18 1521  NA 136  K 6.3*  CL 105  CO2 19*  GLUCOSE 132*  BUN 45*  CREATININE 1.86*  CALCIUM 8.1*   ------------------------------------------------------------------------------------------------------------------  Cardiac Enzymes Recent Labs  Lab 10/26/18 1521  TROPONINI <0.03   ------------------------------------------------------------------------------------------------------------------  RADIOLOGY:  Dg Chest 2 View  Result Date: 10/26/2018 CLINICAL DATA:  Acute shortness of breath and weakness. Patient with lung cancer on chemotherapy. EXAM: CHEST - 2 VIEW COMPARISON:  10/04/2018 chest CT and prior studies FINDINGS: The cardiomediastinal silhouette is unchanged. RIGHT Port-A-Cath again noted with tip overlying the LOWER SVC. LEFT lung opacities and LEFT pleural thickening again noted. No definite acute abnormality noted.  No pneumothorax. No acute bony abnormalities are identified. IMPRESSION: LEFT lung and pleural opacities again noted without definite  acute abnormality. Electronically Signed   By: Margarette Canada M.D.   On: 10/26/2018 16:10     IMPRESSION AND PLAN:   *Hyperkalemia with acute kidney injury.  Likely due to decreased oral intake.  Will start IV fluids.  Will give dextrose, IV insulin, calcium gluconate.  Stat dose of Kayexalate.  Repeat potassium levels in 3 hours.  Hold lisinopril and metformin.  Will have to repeat treatment if potassium still high.  Telemetry monitoring.  *Anemia of chronic disease worsening with malignancy and chemotherapy.  Transfuse 1 unit packed RBC.  Repeat hemoglobin in the morning.  Will check stool for occult blood.  She has received blood transfusions recently for anemia at the cancer center.  *Hypertension.  Low normal blood pressure at this time.  We will continue metoprolol.  Hold hydrochlorothiazide, triamterene, Norvasc, lisinopril.  *Stage IV lung cancer.  Overall seems to be worsening from  what her daughter mentions.  They are waiting for results after the recent immunotherapy.  *DVT prophylaxis with SCDs.  Would avoid heparin or Lovenox till we have results back on stool test for occult blood.  All the records are reviewed and case discussed with ED provider. Management plans discussed with the patient, family and they are in agreement.  CODE STATUS: DNR/DNI  TOTAL CRITICAL CARE  TIME TAKING CARE OF THIS PATIENT: 80 minutes.   Patient has documented healthcare power of attorney.  Her daughter.  Patient has no advance directives in place.  We discussed regarding her admission, treatment plan and prognosis. CODE STATUS discussed and patient wishes to be DO NOT RESUSCITATE DO NOT INTUBATE.  Confirmed this with her daughter present at bedside.  Agrees.  Orders entered.  CODE STATUS changed.  Leia Alf Janece Laidlaw M.D on 10/26/2018 at 5:30 PM  Between 7am to 6pm - Pager - [phone]  After 6pm go to [email or web address] - password [email or web address]  Office   [phone]  CC: Primary care physician; Glendon Axe, MD  Note: This dictation was prepared with Dragon dictation along with smaller phrase technology. Any transcriptional errors that result from this process are unintentional.

## 2018-10-26 NOTE — ED Notes (Signed)
ED TO INPATIENT HANDOFF REPORT  ED Nurse Name and Phone #: sylvia   S Name/Age/Gender Hassell Done Blakley 63 y.o. female Room/Bed: ED07A/ED07A  Code Status   Code Status: DNR  Home/SNF/Other Home Patient oriented to: self Is this baseline? Yes   Triage Complete: Triage complete  Chief Complaint sob  Triage Note Increases sob patient from home o2 dependant. Hx of lung ca, last chemo 2 weeks ago.    Allergies Allergies  Allergen Reactions  . Ibuprofen Other (See Comments)    Is not supposed to take because of her kidneys.  . Penicillins Other (See Comments)    Has patient had a PCN reaction causing immediate rash, facial/tongue/throat swelling, SOB or lightheadedness with hypotension: Unknown Has patient had a PCN reaction causing severe rash involving mucus membranes or skin necrosis: Unknown Has patient had a PCN reaction that required hospitalization: Unknown Has patient had a PCN reaction occurring within the last 10 years: Unknown If all of the above answers are "NO", then may proceed with Cephalosporin use.    Level of Care/Admitting Diagnosis ED Disposition    ED Disposition Condition [facility] Area: [place] [[id number]]  Level of Care: Med-Surg [16]  Diagnosis: Anemia [[id number]]  Admitting Physician: Hillary Bow [[id number]]  Attending Physician: Hillary Bow [[id number]]  Estimated length of stay: past midnight tomorrow  Certification:: I certify this patient will need inpatient services for at least 2 midnights  PT Class (Do Not Modify): Inpatient [101]  PT Acc Code (Do Not Modify): Private [1]       B Medical/Surgery History Past Medical History:  Diagnosis Date  . Cancer ([place])    lung ca DX 2019  . Collagen vascular disease (HCC)    RA  . DM2 (diabetes mellitus, type 2) ([place])   . Dyspnea   . Hyperlipemia   . Hypertension   . Osteomyelitis ([place])   . Pneumonia   . RA (rheumatoid arthritis) ([place])   .  Rheumatoid arthritis [facility])    Past Surgical History:  Procedure Laterality Date  . ENDOBRONCHIAL ULTRASOUND N/A 05/13/2018   Procedure: ENDOBRONCHIAL ULTRASOUND;  Surgeon: [name], MD;  Location: [facility] ORS;  Service: Pulmonary;  Laterality: N/A;  . PORTA CATH INSERTION N/A 05/27/2018   Procedure: PORTA CATH INSERTION;  Surgeon: Katha Cabal, MD;  Location: [facility];  Service: Cardiovascular;  Laterality: N/A;  . TOE SURGERY       A IV Location/Drains/Wounds Patient Lines/Drains/Airways Status   Active Line/Drains/Airways    Name:   Placement date:   Placement time:   Site:   Days:   Implanted Port Right Chest   -    -    Chest             Intake/Output Last 24 hours  Intake/Output Summary (Last 24 hours) at 10/26/2018 1936 Last data filed at 10/26/2018 1847 Gross per 24 hour  Intake 100 ml  Output -  Net 100 ml    Labs/Imaging Results for orders placed or performed during the hospital encounter of 10/26/18 (from the past 48 hour(s))  CBC with Differential     Status: Abnormal   Collection Time: 10/26/18  3:21 PM  Result Value Ref Range   WBC 8.9 4.0 - 10.5 K/uL   RBC 2.24 (L) 3.87 - 5.11 MIL/uL   Hemoglobin 6.7 (L) 12.0 - 15.0 g/dL   HCT 22.0 (L) 36.0 - 46.0 %   MCV 98.2 80.0 - 100.0  fL   MCH 29.9 26.0 - 34.0 pg   MCHC 30.5 30.0 - 36.0 g/dL   RDW 17.0 (H) 11.5 - 15.5 %   Platelets 325 150 - 400 K/uL   nRBC 0.6 (H) 0.0 - 0.2 %   Neutrophils Relative % 86 %   Neutro Abs 7.8 (H) 1.7 - 7.7 K/uL   Lymphocytes Relative 6 %   Lymphs Abs 0.5 (L) 0.7 - 4.0 K/uL   Monocytes Relative 5 %   Monocytes Absolute 0.4 0.1 - 1.0 K/uL   Eosinophils Relative 1 %   Eosinophils Absolute 0.1 0.0 - 0.5 K/uL   Basophils Relative 0 %   Basophils Absolute 0.0 0.0 - 0.1 K/uL   Immature Granulocytes 2 %   Abs Immature Granulocytes 0.16 (H) 0.00 - 0.07 K/uL    Comment: Performed at [facility], [address] [place], [place] [address]  Basic  metabolic panel     Status: Abnormal   Collection Time: 10/26/18  3:21 PM  Result Value Ref Range   Sodium 136 135 - 145 mmol/L   Potassium 6.3 (HH) 3.5 - 5.1 mmol/L    Comment: CRITICAL RESULT CALLED TO, READ BACK BY AND VERIFIED WITH JEANETTE PEREZ 10/26/18 1558 KLW    Chloride 105 98 - 111 mmol/L   CO2 19 (L) 22 - 32 mmol/L   Glucose, Bld 132 (H) 70 - 99 mg/dL   BUN 45 (H) 8 - 23 mg/dL   Creatinine, Ser 1.86 (H) 0.44 - 1.00 mg/dL   Calcium 8.1 (L) 8.9 - 10.3 mg/dL   GFR calc non Af Amer 28 (L) >60 mL/min   GFR calc Af Amer 33 (L) >60 mL/min   Anion gap 12 5 - 15    Comment: Performed at [facility], [address] [place], [place] [address]  Troponin I - ONCE - STAT     Status: None   Collection Time: 10/26/18  3:21 PM  Result Value Ref Range   Troponin I <0.03 <0.03 ng/mL    Comment: Performed at [facility], [address] [place], [place] [address]  Albumin     Status: Abnormal   Collection Time: 10/26/18  3:21 PM  Result Value Ref Range   Albumin 2.4 (L) 3.5 - 5.0 g/dL    Comment: Performed at [facility], [address] [place], [place] [address]  Prepare RBC     Status: None (Preliminary result)   Collection Time: 10/26/18  5:11 PM  Result Value Ref Range   Order Confirmation      ORDER PROCESSED BY BLOOD BANK Performed at [facility], [address] [place], [place] [address]   Type and screen Ordered by PROVIDER DEFAULT     Status: None   Collection Time: 10/26/18  6:04 PM  Result Value Ref Range   ABO/RH(D) O POS    Antibody Screen NEG    Sample Expiration      10/29/2018 Performed at [facility], [address], [place],  [address]    Dg Chest 2 View  Result Date: 10/26/2018 CLINICAL DATA:  Acute shortness of breath and weakness. Patient with lung cancer on chemotherapy. EXAM: CHEST - 2 VIEW COMPARISON:  10/04/2018 chest CT and prior studies FINDINGS: The cardiomediastinal silhouette is  unchanged. RIGHT Port-A-Cath again noted with tip overlying the LOWER SVC. LEFT lung opacities and LEFT pleural thickening again noted. No definite acute abnormality noted.  No pneumothorax. No acute bony abnormalities are  identified. IMPRESSION: LEFT lung and pleural opacities again noted without definite acute abnormality. Electronically Signed   By: Margarette Canada M.D.   On: 10/26/2018 16:10    Pending Labs Unresulted Labs (From admission, onward)    Start     Ordered   10/27/18 0092  Basic metabolic panel  Tomorrow morning,   STAT     10/26/18 1728   10/27/18 0500  CBC  Tomorrow morning,   STAT     10/26/18 1728   10/26/18 2000  Potassium  Once-Timed,   STAT    Comments:  Call MD if K>4.9    10/26/18 1728   10/26/18 1737  Occult blood card to lab, stool RN will collect  Once,   STAT    Question:  Specimen to be collected by?  Answer:  RN will collect   10/26/18 1736   10/26/18 1726  Hemoglobin A1c  Add-on,   AD    Comments:  To assess prior glycemic control    10/26/18 1728          Vitals/Pain Today's Vitals   10/26/18 1719 10/26/18 1730 10/26/18 1745 10/26/18 1800  BP:  118/64  112/68  Pulse:  93 91 95  Resp:  (!) 35 (!) 36 (!) 25  Temp:      TempSrc:      SpO2:  98% 99% 100%  Weight:      Height:      PainSc: 0-No pain       Isolation Precautions No active isolations  Medications Medications  0.9 %  sodium chloride infusion (has no administration in time range)  0.45 % sodium chloride infusion (has no administration in time range)  insulin aspart (novoLOG) injection 0-9 Units (has no administration in time range)  insulin aspart (novoLOG) injection 0-5 Units (has no administration in time range)  sodium chloride flush (NS) 0.9 % injection 3 mL (has no administration in time range)  acetaminophen (TYLENOL) tablet 650 mg (has no administration in time range)    Or  acetaminophen (TYLENOL) suppository 650 mg (has no administration in time range)  polyethylene  glycol (MIRALAX / GLYCOLAX) packet 17 g (has no administration in time range)  ondansetron (ZOFRAN) tablet 4 mg (has no administration in time range)    Or  ondansetron (ZOFRAN) injection 4 mg (has no administration in time range)  albuterol (PROVENTIL) (2.5 MG/3ML) 0.083% nebulizer solution 2.5 mg (has no administration in time range)  0.9 %  sodium chloride infusion ( Intravenous New Bag/Given 10/26/18 1549)  lidocaine-prilocaine (EMLA) cream ( Topical Given 10/26/18 1622)  insulin aspart (novoLOG) injection 6 Units (6 Units Intravenous Given 10/26/18 1713)  dextrose 50 % solution 50 mL (50 mLs Intravenous Given 10/26/18 1715)  calcium gluconate 1 g/ 50 mL sodium chloride IVPB (0 g Intravenous Stopped 10/26/18 1847)  sodium polystyrene (KAYEXALATE) 15 GM/60ML suspension 30 g (30 g Oral Given 10/26/18 1848)    Mobility walks Moderate fall risk   Focused Assessments Cardiac Assessment Handoff:  Cardiac Rhythm: Normal sinus rhythm Lab Results  Component Value Date   TROPONINI <0.03 10/26/2018   No results found for: DDIMER Does the Patient currently have chest pain? No      R Recommendations: See Admitting Provider Note  Report given to: [facility]   Additional Notes:

## 2018-10-26 NOTE — ED Notes (Signed)
Transporting patient to 29

## 2018-10-27 LAB — BASIC METABOLIC PANEL
Anion gap: 8 (ref 5–15)
BUN: 39 mg/dL — ABNORMAL HIGH (ref 8–23)
CO2: 21 mmol/L — AB (ref 22–32)
Calcium: 7.7 mg/dL — ABNORMAL LOW (ref 8.9–10.3)
Chloride: 107 mmol/L (ref 98–111)
Creatinine, Ser: 1.23 mg/dL — ABNORMAL HIGH (ref 0.44–1.00)
GFR calc Af Amer: 54 mL/min — ABNORMAL LOW (ref >60)
GFR calc non Af Amer: 46 mL/min — ABNORMAL LOW (ref >60)
Glucose, Bld: 59 mg/dL — ABNORMAL LOW (ref 70–99)
Potassium: 4.4 mmol/L (ref 3.5–5.1)
Sodium: 136 mmol/L (ref 135–145)

## 2018-10-27 LAB — CBC
HCT: 25.2 % — ABNORMAL LOW (ref 36.0–46.0)
Hemoglobin: 8 g/dL — ABNORMAL LOW (ref 12.0–15.0)
MCH: 29.7 pg (ref 26.0–34.0)
MCHC: 31.7 g/dL (ref 30.0–36.0)
MCV: 93.7 fL (ref 80.0–100.0)
Platelets: 290 10*3/uL (ref 150–400)
RBC: 2.69 MIL/uL — ABNORMAL LOW (ref 3.87–5.11)
RDW: 18.3 % — ABNORMAL HIGH (ref 11.5–15.5)
WBC: 9.7 10*3/uL (ref 4.0–10.5)
nRBC: 1.3 % — ABNORMAL HIGH (ref 0.0–0.2)

## 2018-10-27 LAB — GLUCOSE, CAPILLARY
GLUCOSE-CAPILLARY: 123 mg/dL — AB (ref 70–99)
GLUCOSE-CAPILLARY: 120 mg/dL — AB (ref 70–99)
Glucose-Capillary: 83 mg/dL (ref 70–99)
Glucose-Capillary: 70 mg/dL (ref 70–99)

## 2018-10-27 MED ORDER — ORAL CARE MOUTH RINSE
15.0000 mL | Freq: Two times a day (BID) | OROMUCOSAL | Status: DC
  Administered 2018-10-27 – 2018-10-28 (×3): 15 mL via OROMUCOSAL

## 2018-10-27 MED ORDER — GABAPENTIN 100 MG PO CAPS
100.0000 mg | ORAL_CAPSULE | Freq: Three times a day (TID) | ORAL | Status: DC
  Administered 2018-10-27 – 2018-10-28 (×5): 100 mg via ORAL
  Filled 2018-10-27 (×5): qty 1

## 2018-10-27 MED ORDER — ALPRAZOLAM 0.25 MG PO TABS: 0.2500 mg | ORAL_TABLET | Freq: Three times a day (TID) | ORAL | Status: DC | PRN

## 2018-10-27 NOTE — Progress Notes (Signed)
[facility] [place] NAME: Tamara Parrish    MR#:  [id number]  DATE OF BIRTH:  [birth year]  SUBJECTIVE:  CHIEF COMPLAINT:   Chief Complaint  Patient presents with  . Shortness of Breath   Seen eating breakfast with daughters at bedside. Complains of chronic joint pain when asked with no other complaints.  REVIEW OF SYSTEMS:  Review of Systems  Constitutional: Positive for malaise/fatigue. Negative for chills, fever and weight loss.  HENT: Negative for hearing loss and nosebleeds.   Eyes: Negative for blurred vision, double vision and pain.  Respiratory: Positive for cough (chronic) and shortness of breath (chronic). Negative for hemoptysis, sputum production and wheezing.   Cardiovascular: Positive for leg swelling. Negative for chest pain, palpitations and orthopnea.  Gastrointestinal: Negative for abdominal pain, constipation, diarrhea, nausea and vomiting.  Genitourinary: Negative for dysuria and hematuria.  Musculoskeletal: Positive for joint pain, hands, knees, back. Negative for back pain, falls and myalgias.  Skin: Negative for rash.  Neurological: Negative for dizziness, tremors, sensory change, speech change, focal weakness, seizures and headaches.  Endo/Heme/Allergies: Does not bruise/bleed easily.  Psychiatric/Behavioral: Negative for depression and memory loss. The patient is not nervous/anxious.   DRUG ALLERGIES:   Allergies  Allergen Reactions  . Ibuprofen Other (See Comments)    Is not supposed to take because of her kidneys.  . Penicillins Other (See Comments)    Has patient had a PCN reaction causing immediate rash, facial/tongue/throat swelling, SOB or lightheadedness with hypotension: Unknown Has patient had a PCN reaction causing severe rash involving mucus membranes or skin necrosis: Unknown Has patient had a PCN reaction that required hospitalization: Unknown Has patient had a PCN reaction occurring within the  last 10 years: Unknown If all of the above answers are "NO", then may proceed with Cephalosporin use.   VITALS:  Blood pressure 113/63, pulse (!) 105, temperature 97.8 F (36.6 C), temperature source Oral, resp. rate 19, height 5\' 6"  (1.676 m), weight 89.9 kg, SpO2 97 %. PHYSICAL EXAMINATION:  GENERAL:  63 y.o.-year-old patient sitting up in bed. No acute distress.  EYES: Pupils equal, round, reactive to light and accommodation. No scleral icterus. Extraocular muscles intact.  HEENT: Head atraumatic, normocephalic. Oropharynx and nasopharynx clear. No oropharyngeal erythema, moist oral mucosa  NECK:  Supple, no jugular venous distention. No thyroid enlargement, no tenderness.  LUNGS: Normal breath sounds bilaterally, no wheezing, rales, rhonchi. No use of accessory muscles of respiration.  CARDIOVASCULAR: S1, S2 normal. No murmurs, rubs, or gallops.  ABDOMEN: Soft, nontender, nondistended. Bowel sounds present. No organomegaly or mass.  EXTREMITIES: No  cyanosis, or clubbing. + 2 pedal & radial pulses b/l.  Bilateral lower extremity edema NEUROLOGIC: Cranial nerves II through XII are intact. No focal Motor or sensory deficits appreciated b/l PSYCHIATRIC: The patient is alert and oriented x 3. Good affect.  SKIN: No obvious rash, lesion, or ulcer.  LABORATORY PANEL:  Female CBC Recent Labs  Lab 10/27/18 0438  WBC 9.7  HGB 8.0*  HCT 25.2*  PLT 290   ------------------------------------------------------------------------------------------------------------------ Chemistries  Recent Labs  Lab 10/27/18 0438  NA 136  K 4.4  CL 107  CO2 21*  GLUCOSE 59*  BUN 39*  CREATININE 1.23*  CALCIUM 7.7*   RADIOLOGY:  Dg Chest 2 View  Result Date: 10/26/2018 CLINICAL DATA:  Acute shortness of breath and weakness. Patient with lung cancer on chemotherapy. EXAM: CHEST - 2 VIEW COMPARISON:  10/04/2018 chest CT and prior studies  FINDINGS: The cardiomediastinal silhouette is unchanged. RIGHT  Port-A-Cath again noted with tip overlying the LOWER SVC. LEFT lung opacities and LEFT pleural thickening again noted. No definite acute abnormality noted.  No pneumothorax. No acute bony abnormalities are identified. IMPRESSION: LEFT lung and pleural opacities again noted without definite acute abnormality. Electronically Signed   By: Margarette Canada M.D.   On: 10/26/2018 16:10   ASSESSMENT AND PLAN:   Tamara Parrish  is a 63 y.o. female with a known history of stage IV lung cancer on immunotherapy, rheumatoid arthritis, hypertension, COPD/pulmonary fibrosis with chronic respiratory failure on 2 L oxygen at home presents to the emergency room due to fatigue and shortness of breath.  She is found to have anemia with hemoglobin 6.7 and also hyperkalemia and acute kidney injury.  No history of GI bleed.  No bright red blood per rectum or melena. She had immunotherapy 2 weeks back and since then has felt extremely fatigued. Overall she has declined functionally over the past few months. She has had decreased appetite.  But has not lost any weight.  Plan:   * Hyperkalemia * acute kidney injury  Likely due to decreased oral intake.  IV fluids.  Was given dextrose, IV insulin, calcium gluconate. Stat dose of Kayexalate given.  Repeated potassium levels. Most recent 4.4. Monitor.  Hold lisinopril and metformin. Telemetry monitoring.  *Anemia of chronic disease worsening with malignancy and chemotherapy.  Transfused 1 unit packed RBC.  Repeat hemoglobin 8.0.  Will check stool for occult blood.  She has received blood transfusions recently for anemia at the cancer center. Lab monitoring.   *Type 2 DM * neuropathy A1c 6.7 hold metformin. Insulin novolog + SSI gabapentin  *Hypertension.  Low normal blood pressure at this time.  We will continue metoprolol.  Hold hydrochlorothiazide, triamterene, Norvasc, lisinopril.  *Stage IV lung cancer.  Overall seems to be worsening from what her daughter mentions.   They are waiting for results after the recent immunotherapy. Family does not want a c/s to oncology while they are in the hospital when asked. They decline speaking with palliative care. Supportive therapies & PRNs, oxygen.   * Depression, Anxiety  Xanax PRN, mirtazapine ordered qhs  *Rheumatoid Arthritis  Continue home meds  *DVT prophylaxis with SCDs.  Would avoid heparin or Lovenox till we have results back on stool test for occult blood.   All the records are reviewed and case is discussed with Care Management/Social Worker. Management plans discussed with the patient and/or family and they are in agreement.  CODE STATUS: Partial Code  TOTAL TIME TAKING CARE OF THIS PATIENT: 30 minutes.   More than 50% of the time was spent in counseling/coordination of care: YES  POSSIBLE D/C IN 2-3 DAYS, DEPENDING ON CLINICAL CONDITION.   Ripley Fraise PA-C on 10/27/2018 at 2:43 PM  Between 7am to 6pm - Pager - [phone]  After 6 pm go to [email or web address] - Proofreader  [facility]  Office  [phone]  CC: Primary care physician; Glendon Axe, MD  Note: This dictation was prepared with Dragon dictation along with smaller phrase technology. Any transcriptional errors that result from this process are unintentional.

## 2018-10-27 NOTE — Care Management Note (Signed)
Case Management Note  Patient Details  Name: Tamara Parrish MRN: [id number] Date of Birth: 1956/04/02  Subjective/Objective:  Admitted to [facility] with the diagnosis of anemia. Daughter is Theadora Rama [phone]). Sees Dr, Candiss Norse as primary care physician.  Home Health per [place] 02/2018. Established with [facility] for home oxygen 02/2018  2 liters per nasal cannula continuous                 Action/Plan: Will continue to follow for transition of care needs.    Expected Discharge Date:                  Expected Discharge Plan:     In-House Referral:     Discharge planning Services     Post Acute Care Choice:    Choice offered to:     DME Arranged:    DME Agency:     HH Arranged:    HH Agency:     Status of Service:     If discussed at [employer], dates discussed:    Additional Comments:  Shelbie Ammons, RN MSN CCM Care Management [phone] 10/27/2018, 3:04 PM

## 2018-10-27 NOTE — Progress Notes (Signed)
Family Meeting Note  Advance Directive:yes  Today a meeting took place with the Patient and and 2 daughters at bedside.  The following clinical team members were present during this meeting:MD  The following were discussed:Patient's diagnosis:  63 y.o. female with a known history of stage IV lung cancer on immunotherapy, rheumatoid arthritis, hypertension, COPD/pulmonary fibrosis with chronic respiratory failure on 2 L oxygen admitted for renal failure, hyperkalemia  Past Medical History:  Diagnosis Date  . Cancer ([place])    lung ca DX 2019  . Collagen vascular disease (HCC)    RA  . DM2 (diabetes mellitus, type 2) ([place])   . Dyspnea   . Hyperlipemia   . Hypertension   . Osteomyelitis ([place])   . Pneumonia   . RA (rheumatoid arthritis) ([place])   . Rheumatoid arthritis ([place])     Patient's progosis: > 12 months and Goals for treatment: Partial Code as listed  Additional follow-up to be provided: outpt Palliative care at cancer center if patient/family is willing  Time spent during discussion:16 mins  Max Sane, MD

## 2018-10-27 NOTE — Progress Notes (Signed)
[facility] NAME: Tamara Parrish    MR#:  [id number]  DATE OF BIRTH:  12/01/1955  SUBJECTIVE:  CHIEF COMPLAINT:   Chief Complaint  Patient presents with  . Shortness of Breath  emotional, both daughters at bedside - somewhat in disagreement for her cancer treatment (one feels she is suffering and other not) - patient seem to be leaning towards comfort but can't decide.  REVIEW OF SYSTEMS:  Review of Systems  Constitutional: Positive for malaise/fatigue. Negative for diaphoresis, fever and weight loss.  HENT: Negative for ear discharge, ear pain, hearing loss, nosebleeds, sore throat and tinnitus.   Eyes: Negative for blurred vision and pain.  Respiratory: Negative for cough, hemoptysis, shortness of breath and wheezing.   Cardiovascular: Negative for chest pain, palpitations, orthopnea and leg swelling.  Gastrointestinal: Negative for abdominal pain, blood in stool, constipation, diarrhea, heartburn, nausea and vomiting.  Genitourinary: Negative for dysuria, frequency and urgency.  Musculoskeletal: Negative for back pain and myalgias.  Skin: Negative for itching and rash.  Neurological: Negative for dizziness, tingling, tremors, focal weakness, seizures, weakness and headaches.  Psychiatric/Behavioral: Negative for depression. The patient is not nervous/anxious.     DRUG ALLERGIES:   Allergies  Allergen Reactions  . Ibuprofen Other (See Comments)    Is not supposed to take because of her kidneys.  . Penicillins Other (See Comments)    Has patient had a PCN reaction causing immediate rash, facial/tongue/throat swelling, SOB or lightheadedness with hypotension: Unknown Has patient had a PCN reaction causing severe rash involving mucus membranes or skin necrosis: Unknown Has patient had a PCN reaction that required hospitalization: Unknown Has patient had a PCN reaction occurring within the last 10 years: Unknown If all of the  above answers are "NO", then may proceed with Cephalosporin use.   VITALS:  Blood pressure 113/63, pulse (!) 105, temperature 97.8 F (36.6 C), temperature source Oral, resp. rate 19, height 5\' 6"  (1.676 m), weight 89.9 kg, SpO2 97 %. PHYSICAL EXAMINATION:  Physical Exam HENT:     Head: Normocephalic and atraumatic.  Eyes:     Conjunctiva/sclera: Conjunctivae normal.     Pupils: Pupils are equal, round, and reactive to light.  Neck:     Musculoskeletal: Normal range of motion and neck supple.     Thyroid: No thyromegaly.     Trachea: No tracheal deviation.  Cardiovascular:     Rate and Rhythm: Normal rate and regular rhythm.     Heart sounds: Normal heart sounds.  Pulmonary:     Effort: Pulmonary effort is normal. No respiratory distress.     Breath sounds: Normal breath sounds. No wheezing.  Chest:     Chest wall: No tenderness.  Abdominal:     General: Bowel sounds are normal. There is no distension.     Palpations: Abdomen is soft.     Tenderness: There is no abdominal tenderness.  Musculoskeletal: Normal range of motion.  Skin:    General: Skin is warm and dry.     Findings: No rash.  Neurological:     Mental Status: She is alert and oriented to person, place, and time.     Cranial Nerves: No cranial nerve deficit.    LABORATORY PANEL:  Female CBC Recent Labs  Lab 10/27/18 0438  WBC 9.7  HGB 8.0*  HCT 25.2*  PLT 290   ------------------------------------------------------------------------------------------------------------------ Chemistries  Recent Labs  Lab 10/27/18 0438  NA 136  K 4.4  CL 107  CO2 21*  GLUCOSE 59*  BUN 39*  CREATININE 1.23*  CALCIUM 7.7*   RADIOLOGY:  Dg Chest 2 View  Result Date: 10/26/2018 CLINICAL DATA:  Acute shortness of breath and weakness. Patient with lung cancer on chemotherapy. EXAM: CHEST - 2 VIEW COMPARISON:  10/04/2018 chest CT and prior studies FINDINGS: The cardiomediastinal silhouette is unchanged. RIGHT  Port-A-Cath again noted with tip overlying the LOWER SVC. LEFT lung opacities and LEFT pleural thickening again noted. No definite acute abnormality noted.  No pneumothorax. No acute bony abnormalities are identified. IMPRESSION: LEFT lung and pleural opacities again noted without definite acute abnormality. Electronically Signed   By: Margarette Canada M.D.   On: 10/26/2018 16:10   ASSESSMENT AND PLAN:   *Hyperkalemia with acute kidney injury: potassium normalized. Renal failure Likely due to decreased oral intake.  continue IV fluids.  s/p dextrose, IV insulin, calcium gluconate.  and dose of Kayexalate.Hold lisinopril and metformin.  Telemetry monitoring.  *Anemia of chronic disease worsening with malignancy and chemotherapy: appropriate response to 1 unit packed RBC. Hb 8.0.  Repeat hemoglobin in the morning.  Will check stool for occult blood.  She has received blood transfusions recently for anemia at the cancer center.  *Hypertension.  Low normal blood pressure at this time. continue metoprolol.  Hold hydrochlorothiazide, triamterene, Norvasc, lisinopril.  *Stage IV lung cancer.  Overall seems to be worsening from what her daughter mentions.  They are waiting for results after the recent immunotherapy.  *DVT prophylaxis with SCDs.  Would avoid heparin or Lovenox till we have results back on stool test for occult blood.   They are not able to decide on her code status/palliative care and need more time.  All the records are reviewed and case discussed with Care Management/Social Worker. Management plans discussed with the patient, family and they are in agreement.  CODE STATUS: Partial Code  TOTAL TIME TAKING CARE OF THIS PATIENT: 35 minutes.   More than 50% of the time was spent in counseling/coordination of care: YES  POSSIBLE D/C IN 1 DAYS, DEPENDING ON CLINICAL CONDITION.   Max Sane M.D on 10/27/2018 at 2:48 PM  Between 7am to 6pm - Pager - [phone]  After 6pm go to  [email or web address] - Proofreader  [facility]  Office  [phone]  CC: Primary care physician; Glendon Axe, MD  Note: This dictation was prepared with Dragon dictation along with smaller phrase technology. Any transcriptional errors that result from this process are unintentional.

## 2018-10-27 NOTE — Plan of Care (Signed)

## 2018-10-28 LAB — GLUCOSE, CAPILLARY
Glucose-Capillary: 92 mg/dL (ref 70–99)
Glucose-Capillary: 86 mg/dL (ref 70–99)
Glucose-Capillary: 96 mg/dL (ref 70–99)
Glucose-Capillary: 132 mg/dL — ABNORMAL HIGH (ref 70–99)

## 2018-10-28 LAB — BASIC METABOLIC PANEL
ANION GAP: 6 (ref 5–15)
BUN: 33 mg/dL — ABNORMAL HIGH (ref 8–23)
CALCIUM: 7.4 mg/dL — AB (ref 8.9–10.3)
CO2: 21 mmol/L — ABNORMAL LOW (ref 22–32)
Chloride: 107 mmol/L (ref 98–111)
Creatinine, Ser: 1.15 mg/dL — ABNORMAL HIGH (ref 0.44–1.00)
GFR calc Af Amer: 58 mL/min — ABNORMAL LOW (ref >60)
GFR calc non Af Amer: 50 mL/min — ABNORMAL LOW (ref >60)
Glucose, Bld: 109 mg/dL — ABNORMAL HIGH (ref 70–99)
Potassium: 4.4 mmol/L (ref 3.5–5.1)
Sodium: 134 mmol/L — ABNORMAL LOW (ref 135–145)

## 2018-10-28 LAB — CBC
HCT: 25.6 % — ABNORMAL LOW (ref 36.0–46.0)
Hemoglobin: 8 g/dL — ABNORMAL LOW (ref 12.0–15.0)
MCH: 29.6 pg (ref 26.0–34.0)
MCHC: 31.3 g/dL (ref 30.0–36.0)
MCV: 94.8 fL (ref 80.0–100.0)
NRBC: 0.6 % — AB (ref 0.0–0.2)
Platelets: 258 10*3/uL (ref 150–400)
RBC: 2.7 MIL/uL — ABNORMAL LOW (ref 3.87–5.11)
RDW: 18.6 % — AB (ref 11.5–15.5)
WBC: 13 10*3/uL — ABNORMAL HIGH (ref 4.0–10.5)

## 2018-10-28 MED ORDER — HEPARIN SOD (PORK) LOCK FLUSH 100 UNIT/ML IV SOLN
500.0000 [IU] | INTRAVENOUS | Status: AC | PRN
  Administered 2018-10-28: 500 [IU]
  Filled 2018-10-28: qty 5

## 2018-10-28 MED ORDER — GLUCERNA SHAKE PO LIQD: 237.0000 mL | Freq: Three times a day (TID) | ORAL | 0 refills | Status: AC

## 2018-10-28 MED ORDER — SODIUM CHLORIDE 0.9% FLUSH
10.0000 mL | INTRAVENOUS | Status: AC | PRN
  Administered 2018-10-28: 10 mL

## 2018-10-28 MED ORDER — ALPRAZOLAM 0.25 MG PO TABS: 0.2500 mg | ORAL_TABLET | Freq: Three times a day (TID) | ORAL | 0 refills | Status: AC | PRN

## 2018-10-28 MED ORDER — SUCRALFATE 1 G PO TABS: 1.0000 g | ORAL_TABLET | Freq: Three times a day (TID) | ORAL | 0 refills | Status: AC

## 2018-10-28 MED ORDER — BENZONATATE 100 MG PO CAPS: 100.0000 mg | ORAL_CAPSULE | Freq: Three times a day (TID) | ORAL | 0 refills | Status: AC | PRN

## 2018-10-28 MED ORDER — GLUCERNA SHAKE PO LIQD
237.0000 mL | Freq: Three times a day (TID) | ORAL | Status: DC
  Administered 2018-10-28 (×2): 237 mL via ORAL

## 2018-10-28 MED ORDER — PREDNISONE 20 MG PO TABS: 20.0000 mg | ORAL_TABLET | Freq: Two times a day (BID) | ORAL | 0 refills | Status: AC

## 2018-10-28 MED ORDER — ADULT MULTIVITAMIN W/MINERALS CH: 1.0000 | ORAL_TABLET | Freq: Every day | ORAL | 0 refills | Status: AC

## 2018-10-28 MED ORDER — ADULT MULTIVITAMIN W/MINERALS CH
1.0000 | ORAL_TABLET | Freq: Every day | ORAL | Status: DC
  Administered 2018-10-28: 16:00:00 1 via ORAL
  Filled 2018-10-28: qty 1

## 2018-10-28 MED ORDER — OXYCODONE HCL 10 MG PO TABS: 10.0000 mg | ORAL_TABLET | Freq: Four times a day (QID) | ORAL | 0 refills | Status: DC | PRN

## 2018-10-28 MED ORDER — PREDNISONE 20 MG PO TABS
20.0000 mg | ORAL_TABLET | Freq: Two times a day (BID) | ORAL | Status: DC
  Administered 2018-10-28 (×2): 20 mg via ORAL
  Filled 2018-10-28 (×2): qty 1

## 2018-10-28 MED ORDER — MAGIC MOUTHWASH
5.0000 mL | Freq: Three times a day (TID) | ORAL | Status: DC | PRN
  Administered 2018-10-28: 5 mL via ORAL
  Filled 2018-10-28: qty 10

## 2018-10-28 NOTE — Care Management (Signed)
Tamara Parrish with [facility] called and accepted the patient, they can't see the patient until Mon or possibly Tuesday, I met with the patient and she agreed.

## 2018-10-28 NOTE — Discharge Instructions (Signed)
For your rheumatoid arthritis flare per Dr. Manuella Ghazi: take prednisone 20 mg daily for 6 more days and follow-up outpatient with an arthritis specialist, a rheumatologist, in 7-10 days. We will attempt to make an appointment for you. If we are not able, the number will be provided and please call.  Follow up with your PCP, Dr. Candiss Norse for hospital follow-up and monitoring of your blood counts/anemia within 5 days. We will attempt to make an appointment for you. If we are not able, the number will be provided and please call.  Continue with your cancer treatments as scheduled. Per our conversation your next appointment is 11/02/2018.   Anemia  Anemia is a condition in which you do not have enough red blood cells or hemoglobin. Hemoglobin is a substance in red blood cells that carries oxygen. When you do not have enough red blood cells or hemoglobin (are anemic), your body cannot get enough oxygen and your organs may not work properly. As a result, you may feel very tired or have other problems. What are the causes? Common causes of anemia include:  Excessive bleeding. Anemia can be caused by excessive bleeding inside or outside the body, including bleeding from the intestine or from periods in women.  Poor nutrition.  Long-lasting (chronic) kidney, thyroid, and liver disease.  Bone marrow disorders.  Cancer and treatments for cancer.  HIV (human immunodeficiency virus) and AIDS (acquired immunodeficiency syndrome).  Treatments for HIV and AIDS.  Spleen problems.  Blood disorders.  Infections, medicines, and autoimmune disorders that destroy red blood cells. What are the signs or symptoms? Symptoms of this condition include:  Minor weakness.  Dizziness.  Headache.  Feeling heartbeats that are irregular or faster than normal (palpitations).  Shortness of breath, especially with exercise.  Paleness.  Cold sensitivity.  Indigestion.  Nausea.  Difficulty sleeping.  Difficulty  concentrating. Symptoms may occur suddenly or develop slowly. If your anemia is mild, you may not have symptoms. How is this diagnosed? This condition is diagnosed based on:  Blood tests.  Your medical history.  A physical exam.  Bone marrow biopsy. Your health care provider may also check your stool (feces) for blood and may do additional testing to look for the cause of your bleeding. You may also have other tests, including:  Imaging tests, such as a CT scan or MRI.  Endoscopy.  Colonoscopy. How is this treated? Treatment for this condition depends on the cause. If you continue to lose a lot of blood, you may need to be treated at a hospital. Treatment may include:  Taking supplements of iron, vitamin L89, or folic acid.  Taking a hormone medicine (erythropoietin) that can help to stimulate red blood cell growth.  Having a blood transfusion. This may be needed if you lose a lot of blood.  Making changes to your diet.  Having surgery to remove your spleen. Follow these instructions at home:  Take over-the-counter and prescription medicines only as told by your health care provider.  Take supplements only as told by your health care provider.  Follow any diet instructions that you were given.  Keep all follow-up visits as told by your health care provider. This is important. Contact a health care provider if:  You develop new bleeding anywhere in the body. Get help right away if:  You are very weak.  You are short of breath.  You have pain in your abdomen or chest.  You are dizzy or feel faint.  You have trouble concentrating.  You have bloody or black, tarry stools.  You vomit repeatedly or you vomit up blood. Summary  Anemia is a condition in which you do not have enough red blood cells or enough of a substance in your red blood cells that carries oxygen (hemoglobin).  Symptoms may occur suddenly or develop slowly.  If your anemia is mild, you may  not have symptoms.  This condition is diagnosed with blood tests as well as a medical history and physical exam. Other tests may be needed.  Treatment for this condition depends on the cause of the anemia. This information is not intended to replace advice given to you by your health care provider. Make sure you discuss any questions you have with your health care provider. Document Released: 09/17/2004 Document Revised: 09/11/2016 Document Reviewed: 09/11/2016 Elsevier Interactive Patient Education  2019 [employer].

## 2018-10-28 NOTE — Evaluation (Signed)
Occupational Therapy Evaluation Patient Details Name: Tamara Parrish MRN: [id number] DOB: 1956-06-01 Today's Date: 10/28/2018    History of Present Illness Patient is a 63 year old female admitted for anemia following c/o weakness and dizziness.  PMH includes CA (last chemo 3 wks ago), RA, pneumonia and DM II.   Clinical Impression   Pt seen for OT evaluation this date. Pt was independent in all ADLs and mobility, living in a 1 story home with her daughters (alternating between daughters). Pt on 2 liters of O2 at home. Pt reports becoming easily fatigued or out of breath with minimize exertion, requiring assist for mobility and ADL very recently prior to this admission. Pt currently requires Min-Mod assist for LB ADL and Mod A for ADL transfers due to poor activity tolerance, weakness, impaired balance, and chronic pain. Pt/family educated in energy conservation conservation strategies including pursed lip breathing, activity pacing, home/routines modifications, work simplification, AE/DME, prioritizing of meaningful occupations, and falls prevention as well as cognitive behavioral pain coping strategies. Handout provided. Pt/family verbalized understanding but would benefit from additional skilled OT services to maximize recall and carryover of learned techniques and facilitate implementation of learned techniques into daily routines. Upon discharge, recommend [facility] services.    Follow Up Recommendations  Home health OT    Equipment Recommendations  Tub/shower bench;3 in 1 bedside commode    Recommendations for Other Services       Precautions / Restrictions Precautions Precautions: Fall Restrictions Weight Bearing Restrictions: No      Mobility Bed Mobility     General bed mobility comments: deferred, up in recliner at start and end of session  Transfers Overall transfer level: Needs assistance Equipment used: Rolling walker (2 wheeled) Transfers: Stand Pivot Transfers    Stand pivot transfers: Mod assist;+2 safety/equipment       General transfer comment: Pt required +1 to rise from bed, +2 for safety.  Pt hesistant to stand from bedside but felt better once standing.    Balance Overall balance assessment: Needs assistance Sitting-balance support: Feet supported Sitting balance-Leahy Scale: Good     Standing balance support: Bilateral upper extremity supported Standing balance-Leahy Scale: Fair Standing balance comment: Pt new to use of RW but requires currently for stability and support.                           ADL either performed or assessed with clinical judgement   ADL Overall ADL's : Needs assistance/impaired Eating/Feeding: Sitting;Set up   Grooming: Sitting;Set up   Upper Body Bathing: Sitting;Minimal assistance   Lower Body Bathing: Sit to/from stand;Moderate assistance   Upper Body Dressing : Sitting;Minimal assistance   Lower Body Dressing: Sit to/from stand;Moderate assistance   Toilet Transfer: BSC;Moderate assistance;Ambulation;RW                   Vision Baseline Vision/History: No visual deficits Patient Visual Report: No change from baseline       Perception     Praxis      Pertinent Vitals/Pain Pain Assessment: 0-10 Pain Score: 8  Faces Pain Scale: Hurts a little bit Pain Location: 7/10 middle back, 8/10 B hands Pain Descriptors / Indicators: Aching;Grimacing;Guarding Pain Intervention(s): Limited activity within patient's tolerance;Monitored during session;Repositioned;Premedicated before session     Hand Dominance Left   Extremity/Trunk Assessment Upper Extremity Assessment Upper Extremity Assessment: Generalized weakness   Lower Extremity Assessment Lower Extremity Assessment: Overall WFL for tasks assessed(Ankle DF/PF, knee flexion/extension: 4-/5,  hip flexion: 3+/5 bilaterally)   Cervical / Trunk Assessment Cervical / Trunk Assessment: Kyphotic   Communication  Communication Communication: No difficulties   Cognition Arousal/Alertness: Awake/alert Behavior During Therapy: WFL for tasks assessed/performed Overall Cognitive Status: Within Functional Limits for tasks assessed                                 General Comments: Follows commands consistently.  Very responsive to daughters.   General Comments       Exercises Other Exercises Other Exercises: pt/family instructed in energy conservation strategies including home/routines modifications, work simplification, AE/DME, activity pacing, and falls prevention Other Exercises: pt/family instructed in cognitive behavioral pain coping strategies to improve pt's self mgt of chronic pain   Shoulder Instructions      Home Living Family/patient expects to be discharged to:: Private residence Living Arrangements: Children(Pt alternates between two daughters houses.) Available Help at Discharge: Family;Available 24 hours/day Type of Home: House Home Access: Level entry     Home Layout: One level     Bathroom Shower/Tub: Tub/shower unit;Curtain(Takes bird baths)   Bathroom Toilet: Standard     Home Equipment: Walker - 2 wheels          Prior Functioning/Environment Level of Independence: Independent with assistive device(s)        Comments: Pt has only started use of RW in the past 1-2 weeks.  Mostly sedentary/household ambulation. Was indep with ADL and IADL tasks, but hasn't driven since January        OT Problem List: Decreased strength;Decreased knowledge of use of DME or AE;Pain;Impaired UE functional use;Impaired balance (sitting and/or standing);Decreased activity tolerance;Cardiopulmonary status limiting activity      OT Treatment/Interventions: Self-care/ADL training;Balance training;Therapeutic exercise;Therapeutic activities;Energy conservation;DME and/or AE instruction;Patient/family education    OT Goals(Current goals can be found in the care plan  section) Acute Rehab OT Goals Patient Stated Goal: to regain strength OT Goal Formulation: With patient/family Time For Goal Achievement: 11/11/18 Potential to Achieve Goals: Good ADL Goals Pt Will Perform Lower Body Bathing: with supervision;with set-up;sit to/from stand;with adaptive equipment(from TTB/DME) Pt Will Perform Tub/Shower Transfer: tub bench;with min assist Additional ADL Goal #1: Pt will verbalize plan to implement at least 2 learned energy conservation strategies to maximize safety/independence with ADL and IADL. Additional ADL Goal #2: Pt will verbalize plan to implement at least 2 cognitive behavioral pain coping strategies to maximize safety/independence with ADL and IADL with improved self mgt of chronic pain.  OT Frequency: Min 2X/week   Barriers to D/C:            Co-evaluation              AM-PAC OT "6 Clicks" Daily Activity     Outcome Measure Help from another person eating meals?: None Help from another person taking care of personal grooming?: None Help from another person toileting, which includes using toliet, bedpan, or urinal?: A Lot Help from another person bathing (including washing, rinsing, drying)?: A Lot Help from another person to put on and taking off regular upper body clothing?: A Little Help from another person to put on and taking off regular lower body clothing?: A Lot 6 Click Score: 17   End of Session    Activity Tolerance: Patient tolerated treatment well Patient left: in chair;with call bell/phone within reach;with chair alarm set;with family/visitor present  OT Visit Diagnosis: Other abnormalities of gait and mobility (R26.89);Muscle weakness (generalized) (M62.81);Pain  Pain - Right/Left: Right(B hands, and middle back) Pain - part of body: Hand                Time: 1351-1413 OT Time Calculation (min): 22 min Charges:  OT General Charges $OT Visit: 1 Visit OT Evaluation $OT Eval Low Complexity: 1 Low OT  Treatments $Self Care/Home Management : 8-22 mins  Jeni Salles, MPH, MS, OTR/L ascom [phone] 10/28/18, 2:29 PM

## 2018-10-28 NOTE — Care Management (Signed)
Patient states she has no further needs at this time

## 2018-10-28 NOTE — Care Management (Signed)
Tamara Parrish from [facility] sent email and notified me that they are unable to accept the patient back for [facility] services, he states that they closed the patient in July 2019 The patient requested to try [facility], Emailed [place] with [facility] requesting them to accept the patient

## 2018-10-28 NOTE — Evaluation (Signed)
Physical Therapy Evaluation Patient Details Name: Tamara Parrish MRN: [id number] DOB: 02-Jan-1956 Today's Date: 10/28/2018   History of Present Illness  Patient is a 63 year old female admitted for anemia following c/o weakness and dizziness.  PMH includes CA (last chemo 3 wks ago), RA, pneumonia and DM II.  Clinical Impression  Pt is a 63 year old female who lives in a one story home with her daughter.   She has started using a RW for mobility in the past 2-3 weeks.  Pt in bed and reporting fatigue upon PT arrival.  She was able to perform bed mobility with min A to bring LE's over EOB and to sit upright.  Pt able to sit at EOB without difficulty.  Pt presented with poor UE strength and fair LE strength.  She required +2 mod A to transfer with use of RW.  PT provided VC's for proper use of RW and pt was able to take a few steps to the recliner with CGA only.  Pt open to education and able to demonstrate understanding of exercises as well as importance of HEP.  PT will continue to benefit from skilled PT with focus on strength, tolerance to activity, balance and use of AD.  Pt will benefit from [facility] PT following discharge.    Follow Up Recommendations Home health PT;Supervision - Intermittent    Equipment Recommendations  3in1 (PT)    Recommendations for Other Services       Precautions / Restrictions Precautions Precautions: Fall Restrictions Weight Bearing Restrictions: No      Mobility  Bed Mobility Overal bed mobility: Needs Assistance Bed Mobility: Supine to Sit     Supine to sit: Min assist     General bed mobility comments: Hand held assist and assistance to initiate movement of LE's.  Transfers Overall transfer level: Needs assistance Equipment used: Rolling walker (2 wheeled) Transfers: Stand Pivot Transfers   Stand pivot transfers: Mod assist;+2 safety/equipment       General transfer comment: Pt required +1 to rise from bed, +2 for safety.  Pt hesistant to  stand from bedside but felt better once standing.  Ambulation/Gait Ambulation/Gait assistance: Min guard Gait Distance (Feet): 3 Feet Assistive device: Rolling walker (2 wheeled)     Gait velocity interpretation: <1.31 ft/sec, indicative of household ambulator General Gait Details: Very low foot clearance and step length, fair use of RW with VC's and pt required prompting to control descent to chair when sitting.  Stairs            Wheelchair Mobility    Modified Rankin (Stroke Patients Only)       Balance Overall balance assessment: Needs assistance Sitting-balance support: Feet supported Sitting balance-Leahy Scale: Good     Standing balance support: Bilateral upper extremity supported Standing balance-Leahy Scale: Fair Standing balance comment: Pt new to use of RW but requires currently for stability and support.                             Pertinent Vitals/Pain Pain Assessment: Faces Faces Pain Scale: Hurts a little bit Pain Location: Some pain with mobility but pt mostly reporting weakness Pain Intervention(s): Limited activity within patient's tolerance    Home Living Family/patient expects to be discharged to:: Private residence Living Arrangements: Children(Pt alternates between two daughters houses.) Available Help at Discharge: Family;Available 24 hours/day Type of Home: House Home Access: Level entry     Home Layout: One  level Home Equipment: Walker - 2 wheels      Prior Function Level of Independence: Independent with assistive device(s)         Comments: Pt has only started use of RW in the past 1-2 weeks.  Mostly sedentary/household ambulation.      Hand Dominance   Dominant Hand: Left    Extremity/Trunk Assessment   Upper Extremity Assessment Upper Extremity Assessment: Generalized weakness    Lower Extremity Assessment Lower Extremity Assessment: Overall WFL for tasks assessed(Ankle DF/PF, knee flexion/extension:  4-/5, hip flexion: 3+/5 bilaterally)    Cervical / Trunk Assessment Cervical / Trunk Assessment: Kyphotic  Communication   Communication: No difficulties  Cognition Arousal/Alertness: Awake/alert Behavior During Therapy: WFL for tasks assessed/performed Overall Cognitive Status: Within Functional Limits for tasks assessed                                 General Comments: Follows commands consistently.  Very responsive to daughters.      General Comments      Exercises Other Exercises Other Exercises: Discussed importance of HEP and exercises which pt can perform in chair.  Pt demonstrated understanding.  x5 min   Assessment/Plan    PT Assessment Patient needs continued PT services  PT Problem List Decreased strength;Decreased mobility;Decreased activity tolerance;Decreased balance;Decreased knowledge of use of DME       PT Treatment Interventions DME instruction;Therapeutic activities;Gait training;Therapeutic exercise;Patient/family education;Stair training;Balance training;Functional mobility training    PT Goals (Current goals can be found in the Care Plan section)  Acute Rehab PT Goals Patient Stated Goal: to regain strength PT Goal Formulation: With patient/family Time For Goal Achievement: 11/11/18 Potential to Achieve Goals: Good    Frequency Min 2X/week   Barriers to discharge        Co-evaluation               AM-PAC PT "6 Clicks" Mobility  Outcome Measure Help needed turning from your back to your side while in a flat bed without using bedrails?: None Help needed moving from lying on your back to sitting on the side of a flat bed without using bedrails?: A Little Help needed moving to and from a bed to a chair (including a wheelchair)?: A Little Help needed standing up from a chair using your arms (e.g., wheelchair or bedside chair)?: A Lot Help needed to walk in hospital room?: A Lot Help needed climbing 3-5 steps with a railing? : A  Lot 6 Click Score: 16    End of Session Equipment Utilized During Treatment: Gait belt Activity Tolerance: Patient limited by fatigue Patient left: in chair;with chair alarm set;with call bell/phone within reach;with family/visitor present Nurse Communication: Mobility status PT Visit Diagnosis: Unsteadiness on feet (R26.81);Muscle weakness (generalized) (M62.81)    Time: 1275-1700 PT Time Calculation (min) (ACUTE ONLY): 22 min   Charges:   PT Evaluation $PT Eval Low Complexity: 1 Low         Roxanne Gates, PT, DPT   Roxanne Gates 10/28/2018, 1:38 PM

## 2018-10-28 NOTE — Care Management Note (Signed)
Case Management Note  Patient Details  Name: Tamara Parrish MRN: [id number] Date of Birth: [birth year]  Subjective/Objective:                  Met with the patient and discussed DC plan and needs The patient has a RW at home and wants a 3 in 1, notified Brad with [facility] via secure email [facility] list provided to the patient per CMS.gov, patient is open with [facility] already, notified [name] with [facility] of resumption of care. Patient lives alternating with daughters, she lives part time with one and part time with the other. Has family support and help. Is open with [facility] for Chronic Oxygen at 2 liters Patient [place] in [place] and can afford medications PCP is Candiss Norse and has transportation with family to and from Dr.  Action/Plan: [facility] list provided, open with [facility] for [facility] and O2, notified [name] of resumption of care, needs a 3 in 1 notified Brad with [facility] of the need and will Dc today  Expected Discharge Date:  10/28/18               Expected Discharge Plan:     In-House Referral:     Discharge planning Services  CM Consult  Post Acute Care Choice:  Home Health Choice offered to:  Patient  DME Arranged:  3-N-1 DME Agency:  [facility]  HH Arranged:  PT, OT, Respirator Therapy HH Agency:  [place] [facility]  Status of Service:  In process, will continue to follow  If discussed at Long Length of Stay Meetings, dates discussed:    Additional Comments:  Su Hilt, RN 10/28/2018, 3:18 PM

## 2018-10-28 NOTE — Progress Notes (Signed)
Initial Nutrition Assessment  DOCUMENTATION CODES:   Obesity unspecified  INTERVENTION:   - Snacks TID between meals  - Provided education regarding high protein, high calorie nutrition therapy  - MVI with minerals daily  - d/c Glucerna Shake  NUTRITION DIAGNOSIS:   Inadequate oral intake related to nausea, poor appetite, early satiety as evidenced by per patient/family report.  GOAL:   Patient will meet greater than or equal to 90% of their needs  MONITOR:   PO intake, Weight trends, Labs  REASON FOR ASSESSMENT:   Malnutrition Screening Tool    ASSESSMENT:   63 year old female who presented to the ED on 3/4 with SOB, weakness. PMH of stage IV lung cancer on immunotherapy, rheumatoid arthritis, COPD/pulmonary fibrosis with chronic respiratory failure on 2 L oxygen at home, T2DM, HLD, HTN. Pt admitted with anemia, AKI.  Spoke with pt and daughter at bedside. Pt states that she will be leaving later today.  Pt reports that her appetite is "up and down." Pt reports that her appetite decreased when she first had PNA. Pt reports that after getting better, she started eating better but now has noticed her appetite decreasing again. Pt reports that she feels hungry but can only eat a few bites at meals due to early satiety.  Pt shares that on a good day, she typically eats 2 meals and snacks in between meals. On a more difficult day, pt typically just snacks between meals. When asked what a meal may include, pt's daughter states that she cooks "a three-course meal." Pt reports that she does not drink oral nutrition supplements due to taste preference but does like ice cream, sherbet, and milkshakes.  Pt endorses several negative side effects from immunotherapy including persistent nausea, occasoinal vomiting, and decreased appetite. RD provided education regarding symptom management.  Pt endorses some weight loss and reports her UBW as 200 lbs. Pt does not know when she last  weighed this. Per weight history in chart, pt's weight has been slowly increasing over the last 6 months.  Pt amenable to RD ordering snacks between meals. Pt declines all oral nutrition supplements offered due to taste preference. RD to d/c Glucerna Shake.  Pt denies any issues chewing but states that she has had some trouble swallowing. Pt shares that her throat has been sore recently but that the doctor ordered her some Magic Mouthwash that has helped with this and that now she can swallow her pills.  Meal Completion: 80%, 50%, 50%  Medications reviewed and include: Glucerna Shake TID, folic acid, SSI, Remeron 7.5 mg daily, Protonix, Prednisone  Labs reviewed: sodium 134 (L), BUN 33 (H), creatinine 1.15 (H), hemoglobin 8.0 (L) CBG's: 96, 92, 70, 120 x 24 hours  NUTRITION - FOCUSED PHYSICAL EXAM:    Most Recent Value  Orbital Region  No depletion  Upper Arm Region  No depletion  Thoracic and Lumbar Region  No depletion  Buccal Region  No depletion  Temple Region  No depletion  Clavicle Bone Region  No depletion  Clavicle and Acromion Bone Region  Mild depletion  Scapular Bone Region  No depletion  Dorsal Hand  No depletion  Patellar Region  Mild depletion  Anterior Thigh Region  Mild depletion  Posterior Calf Region  Moderate depletion  Edema (RD Assessment)  Mild [BLE]  Hair  Reviewed  Eyes  Reviewed  Mouth  Reviewed  Skin  Reviewed  Nails  Reviewed       Diet Order:   Diet Order  Diet - low sodium heart healthy        Diet Carb Modified Fluid consistency: Thin; Room service appropriate? Yes  Diet effective now              EDUCATION NEEDS:   Education needs have been addressed  Skin:  Skin Assessment: Reviewed RN Assessment  Last BM:  10/26/18 large type 5  Height:   Ht Readings from Last 1 Encounters:  10/26/18 5\' 6"  (1.676 m)    Weight:   Wt Readings from Last 1 Encounters:  10/27/18 89.9 kg    Ideal Body Weight:  59.1 kg  BMI:   Body mass index is 32.01 kg/m.  Estimated Nutritional Needs:   Kcal:  9396-8864  Protein:  90-105 grams  Fluid:  1.8-2.0 L    Gaynell Face, MS, RD, LDN Inpatient Clinical Dietitian Pager: [phone] Weekend/After Hours: [phone]

## 2018-10-28 NOTE — Progress Notes (Signed)
Pt is d/ced home with her daughters.  She worked with PT today and got up to the chair and up to [facility].  She will have home health and we're getting her a BSC.  Pt's Hgb was 8.0.  Her port was deaccessed.  Reviewed d/c instructions, f/u appts and new medications.  Her daughter will transport her home.

## 2018-10-28 NOTE — Care Management (Signed)
[place] with [facility] emailed back saying that they can't take this patient because of it being Medicaid Notified Helene Kelp with [facility] and [place] with [facility] stated that due to being Medicaid they can't take this patient, awaiting word back from [facility]

## 2018-10-28 NOTE — Discharge Summary (Signed)
[facility] [place] NAME: Tamara Parrish    MR#:  [id number]  DATE OF BIRTH:  Oct 05, 1955  DATE OF ADMISSION:  10/26/2018   ADMITTING PHYSICIAN: Hillary Bow, MD  DATE OF DISCHARGE: 10/28/2018  PRIMARY CARE PHYSICIAN: Glendon Axe, MD   ADMISSION DIAGNOSIS:  Hyperkalemia [E87.5] Weakness [R53.1] Symptomatic anemia [D64.9] DISCHARGE DIAGNOSIS:  Active Problems:   Anemia  SECONDARY DIAGNOSIS:   Past Medical History:  Diagnosis Date  . Cancer ([place])    lung ca DX 2019  . Collagen vascular disease ([facility])    RA  . DM2 (diabetes mellitus, type 2) ([place])   . Dyspnea   . Hyperlipemia   . Hypertension   . Osteomyelitis ([place])   . Pneumonia   . RA (rheumatoid arthritis) ([place])   . Rheumatoid arthritis [facility])    HOSPITAL COURSE:   MargaretSearcyis a63 y.o.femalewith a known history of stage IV lung cancer on immunotherapy, rheumatoid arthritis, hypertension, COPD/pulmonary fibrosis with chronic respiratory failure on 2 L oxygen at home presents to the emergency room due to fatigue and shortness of breath. She is found to have anemia with hemoglobin 6.7 and also hyperkalemia and acute kidney injury. No history of GI bleed. No bright red blood per rectum or melena. She had immunotherapy 2 weeks back and since then has felt extremely fatigued. Overall she has declined functionally over the past few months. She has had decreased appetite. But has not lost any weight.  Close follow-up with PCP within 5 days Follow-up with cancer center as previously scheduled 11/02/2018  Plan:    * Hyperkalemia * Acute kidney injury, resolving Likely due to decreased oral intake. IV fluids. Was given dextrose, IV insulin, calcium gluconate. Stat dose of Kayexalate given. Repeated potassium levels. Most recent 4.4. Monitor. BUN, Crt, GFR improved, repeat BMET with PCP follow-up. Held lisinopril and metformin. Telemetry monitoring.  *Anemia of  chronic disease worsening with malignancy and chemotherapy. Transfused 1 unit packed RBC. Repeat hemoglobin 8.0. Stable. Ordered check stool for occult blood however it was not completed. Complete outpatient if possible with PCP. She has received blood transfusions recently for anemia at the cancer center. Lab monitoring with follow-up.   *Type 2 DM * neuropathy A1c 6.7 hold metformin while admitted for AKI. Insulin novolog + SSI.  Gabapentin. Continue home meds  *Hypertension. Low normal blood pressure. We will continue metoprolol. Hold hydrochlorothiazide, triamterene, Norvasc, lisinopril while admitted AKI. Continue home meds at discharge.  *Stage IV lung cancer. Overall seems to be worsening from what her daughter mentions. They are waiting for results after the recent immunotherapy. Family does not want a c/s to oncology while they are in the hospital when asked. They decline speaking with palliative care. Supportive therapies & PRNs, oxygen. Palliative care outpatient offered and declined.  For weakness and deconditioning: evaluated by PT/OT. Continue HH PT and [place] OT. DME 3 in 1 ordered. Oxygen needs ordered. PRNs given and/or reordered for cough, mouth and throat pain.  Next follow-up already scheduled for 11/02/2018 cancer center.  * Depression, Anxiety  Xanax PRN, mirtazapine ordered qhs. Continue home meds.   *Rheumatoid Arthritis  Continue home meds. Prednisone 20 mg x 7 total days provided per Dr. Manuella Parrish for acute flare. She should follow with outpatient rheumatology in 7-10 days for chronic arthritis management.   *DVT prophylaxis with SCDs. Would avoid heparin or Lovenox till we have results back on stool test for occult blood. Was not completed this admission. Patient  should have occult blood test done with PCP if possible.  At discharge patient and family requested refills of pain medications and anxiety medications. 5 day limited supply provided only. Aware that  they will need to follow-up with their regular provider for refills.   DISCHARGE CONDITIONS:  Stable CONSULTS OBTAINED:   DRUG ALLERGIES:   Allergies  Allergen Reactions  . Ibuprofen Other (See Comments)    Is not supposed to take because of her kidneys.  . Penicillins Other (See Comments)    Has patient had a PCN reaction causing immediate rash, facial/tongue/throat swelling, SOB or lightheadedness with hypotension: Unknown Has patient had a PCN reaction causing severe rash involving mucus membranes or skin necrosis: Unknown Has patient had a PCN reaction that required hospitalization: Unknown Has patient had a PCN reaction occurring within the last 10 years: Unknown If all of the above answers are "NO", then may proceed with Cephalosporin use.   DISCHARGE MEDICATIONS:   Allergies as of 10/28/2018      Reactions   Ibuprofen Other (See Comments)   Is not supposed to take because of her kidneys.   Penicillins Other (See Comments)   Has patient had a PCN reaction causing immediate rash, facial/tongue/throat swelling, SOB or lightheadedness with hypotension: Unknown Has patient had a PCN reaction causing severe rash involving mucus membranes or skin necrosis: Unknown Has patient had a PCN reaction that required hospitalization: Unknown Has patient had a PCN reaction occurring within the last 10 years: Unknown If all of the above answers are "NO", then may proceed with Cephalosporin use.      Medication List    TAKE these medications   albuterol 108 (90 Base) MCG/ACT inhaler Commonly known as:  PROVENTIL HFA;VENTOLIN HFA Inhale 2 puffs into the lungs every 6 (six) hours as needed for wheezing or shortness of breath.   ALPRAZolam 0.25 MG tablet Commonly known as:  XANAX Take 1 tablet (0.25 mg total) by mouth 3 (three) times daily as needed for up to 5 days for anxiety. What changed:  when to take this   amLODipine 5 MG tablet Commonly known as:  NORVASC Take 5 mg by mouth  daily.   aspirin 81 MG chewable tablet Chew 1 tablet by mouth daily.   atorvastatin 40 MG tablet Commonly known as:  LIPITOR Take 40 mg by mouth daily.   Basaglar KwikPen 100 UNIT/ML Sopn Inject 38 Units into the skin every morning.   benzonatate 100 MG capsule Commonly known as:  Tessalon Perles Take 1 capsule (100 mg total) by mouth 3 (three) times daily as needed for up to 7 days for cough.   budesonide-formoterol 160-4.5 MCG/ACT inhaler Commonly known as:  Symbicort Inhale 2 puffs into the lungs 2 (two) times daily.   dexamethasone 4 MG tablet Commonly known as:  DECADRON Take 2 tablets (8 mg total) by mouth daily. Start the day after chemotherapy for 2 days.   feeding supplement (GLUCERNA SHAKE) Liqd Take 237 mLs by mouth 3 (three) times daily between meals for 14 days.   ferrous sulfate 325 (65 FE) MG EC tablet Take 1 tablet (325 mg total) by mouth 2 (two) times daily with a meal.   folic acid 1 MG tablet Commonly known as:  FOLVITE Take 1 mg by mouth daily.   hydroxychloroquine 200 MG tablet Commonly known as:  PLAQUENIL 1 tab twice a day, 90 days   lidocaine-prilocaine cream Commonly known as:  EMLA Apply to affected area once   lisinopril 40  MG tablet Commonly known as:  PRINIVIL,ZESTRIL Take 40 mg by mouth daily.   metFORMIN 1000 MG tablet Commonly known as:  GLUCOPHAGE Take 1,000 mg by mouth 2 (two) times daily.   metoprolol tartrate 25 MG tablet Commonly known as:  LOPRESSOR Take 25 mg by mouth 2 (two) times daily.   mirtazapine 7.5 MG tablet Commonly known as:  REMERON Take 1 tablet (7.5 mg total) by mouth at bedtime.   multivitamin with minerals Tabs tablet Take 1 tablet by mouth daily for 30 days. Start taking on:  October 29, 2018   Narcan 4 MG/0.1ML Liqd nasal spray kit Generic drug:  naloxone CALL 911. ADMINISTER A SINGLE SPRAY OF NARCAN IN ONE NOSTRIL. REPEAT EVERY 3 MINUTES AS NEEDED IF NO OR MINIMAL RESPONSE.   omeprazole 20 MG  capsule Commonly known as:  PRILOSEC Take 1 capsule (20 mg total) by mouth daily.   ondansetron 8 MG tablet Commonly known as:  Zofran Take 1 tablet (8 mg total) by mouth 2 (two) times daily as needed for refractory nausea / vomiting. Start on day 3 after chemo.   Oxycodone HCl 10 MG Tabs Commonly known as:  Roxicodone Take 1 tablet (10 mg total) by mouth every 6 (six) hours as needed for up to 5 days for severe pain. What changed:    medication strength  how much to take  when to take this  reasons to take this   predniSONE 20 MG tablet Commonly known as:  DELTASONE Take 1 tablet (20 mg total) by mouth 2 (two) times daily with a meal for 6 days.   prochlorperazine 10 MG tablet Commonly known as:  COMPAZINE Take 1 tablet (10 mg total) by mouth every 6 (six) hours as needed (Nausea or vomiting).   sucralfate 1 g tablet Commonly known as:  Carafate Take 1 tablet (1 g total) by mouth 3 (three) times daily for 5 days. Dissolve in 3-4 tbsp warm water, swish and swallow   triamterene-hydrochlorothiazide 37.5-25 MG capsule Commonly known as:  DYAZIDE Take 1 capsule by mouth daily.            Durable Medical Equipment  (From admission, onward)         Start     Ordered   10/28/18 1442  DME 3-in-1  Once     10/28/18 1446   10/28/18 0000  For home use only DME oxygen    Question Answer Comment  Mode or (Route) Nasal cannula   Liters per Minute 3   Oxygen delivery system Gas      10/28/18 1446           DISCHARGE INSTRUCTIONS:   DIET:  Diabetic diet DISCHARGE CONDITION:  Stable ACTIVITY:  Activity as tolerated OXYGEN:  Home Oxygen: Yes.    Oxygen Delivery: 2-3 liters/min via Patient connected to nasal cannula oxygen DISCHARGE LOCATION:  home with HH PT and HH OT  If you experience worsening of your admission symptoms, develop shortness of breath, life threatening emergency, suicidal or homicidal thoughts you must seek medical attention immediately by  calling 911 or calling your MD immediately if your symptoms are severe.  You Must read complete instructions/literature along with all the possible adverse reactions/side effects for all the medicines you take and that have been prescribed to you. Take any new medicines only after you have completely understood and accept all the possible adverse reactions/side effects.   Please note  You were cared for by a hospitalist during your hospital  stay. If you have any questions about your discharge medications or the care you received while you were in the hospital after you are discharged, you can call the unit and asked to speak with the hospitalist on call if the hospitalist that took care of you is not available. Once you are discharged, your primary care physician will handle any further medical issues. Please note that NO REFILLS for any discharge medications will be authorized once you are discharged, as it is imperative that you return to your primary care physician (or establish a relationship with a primary care physician if you do not have one) for your aftercare needs so that they can reassess your need for medications and monitor your lab values.    On the day of Discharge:  VITAL SIGNS:  Blood pressure (!) 149/70, pulse (!) 109, temperature 98.1 F (36.7 C), temperature source Oral, resp. rate 20, height '5\' 6"'$  (1.676 m), weight 89.9 kg, SpO2 95 %. PHYSICAL EXAMINATION:  GENERAL:63 y.o.-year-old patient sitting up in bed eating. No acute distress.  EYES: Pupils equal, round, reactive to light and accommodation. No scleral icterus. Extraocular muscles intact.  HEENT: Head atraumatic, normocephalic. Oropharynx with slight white film and nasopharynx clear. No oropharyngeal erythema, moist oral mucosa   NECK: Supple, no jugular venous distention. No thyroid enlargement, no tenderness.  LUNGS: Normal breath sounds bilaterally, no wheezing, rales, rhonchi. No use of accessory muscles of  respiration.  CARDIOVASCULAR: S1, S2 normal. No murmurs, rubs, or gallops.  ABDOMEN: Soft, nontender, nondistended. Bowel sounds present. No organomegaly or mass.  EXTREMITIES: No cyanosis, or clubbing. + 2 pedal &radial pulses b/l. Bilateral lower extremity edema trace NEUROLOGIC: Cranial nerves II through XII are intact. No focal Motor or sensory deficits appreciated b/l PSYCHIATRIC: The patient is alert and oriented x 3. Appropriate affect.  SKIN: No obvious rash, lesion, or ulcer.  DATA REVIEW:   CBC Recent Labs  Lab 10/28/18 0548  WBC 13.0*  HGB 8.0*  HCT 25.6*  PLT 258    Chemistries  Recent Labs  Lab 10/28/18 0548  NA 134*  K 4.4  CL 107  CO2 21*  GLUCOSE 109*  BUN 33*  CREATININE 1.15*  CALCIUM 7.4*    RADIOLOGY:  No results found.   Management plans discussed with the patient and/or family and they are in agreement.  CODE STATUS: Partial Code   TOTAL TIME TAKING CARE OF THIS PATIENT: 45 minutes.    Ripley Fraise PA-C on 10/28/2018 at 4:43 PM  Between 7am to 6pm - Pager - [phone]  After 6pm go to [email or web address] - Proofreader  [facility]  Hospitalists  Office  [phone]  CC: Primary care physician; Glendon Axe, MD

## 2018-10-28 NOTE — Care Management (Signed)
Notified [name] with [facility] requesting to accept the patient, [name] stated they can't take the patient Tamara Parrish with [facility] requested Patient information

## 2018-10-29 LAB — TYPE AND SCREEN
ABO/RH(D): O POS
Antibody Screen: NEGATIVE
UNIT DIVISION: 0
UNIT DIVISION: 0
Unit division: 0
Unit division: 0

## 2018-10-29 LAB — BPAM RBC
BLOOD PRODUCT EXPIRATION DATE: 202003222359
Blood Product Expiration Date: 202003092359
Blood Product Expiration Date: 202003222359
Blood Product Expiration Date: 202003312359
ISSUE DATE / TIME: 202003042244
ISSUE DATE / TIME: 202003060949
UNIT TYPE AND RH: 5100
Unit Type and Rh: 5100
Unit Type and Rh: 5100
Unit Type and Rh: 9500

## 2018-10-29 LAB — CALCIUM, IONIZED: Calcium, Ionized, Serum: 4.5 mg/dL (ref 4.5–5.6)

## 2018-10-29 LAB — PREPARE RBC (CROSSMATCH)

## 2018-10-31 ENCOUNTER — Ambulatory Visit: Payer: PRIVATE HEALTH INSURANCE | Admitting: Oncology

## 2018-10-31 ENCOUNTER — Other Ambulatory Visit: Payer: PRIVATE HEALTH INSURANCE

## 2018-10-31 ENCOUNTER — Encounter: Payer: PRIVATE HEALTH INSURANCE | Admitting: Hospice and Palliative Medicine

## 2018-10-31 ENCOUNTER — Ambulatory Visit: Payer: PRIVATE HEALTH INSURANCE

## 2018-11-02 ENCOUNTER — Ambulatory Visit
Admission: RE | Admit: 2018-11-02 | Discharge: 2018-11-02 | Disposition: A | Discharge status: Home / Self Care | Payer: Medicaid Other | Source: Ambulatory Visit | Attending: Oncology | Admitting: Oncology

## 2018-11-02 ENCOUNTER — Other Ambulatory Visit: Payer: Self-pay

## 2018-11-02 ENCOUNTER — Inpatient Hospital Stay (HOSPITAL_BASED_OUTPATIENT_CLINIC_OR_DEPARTMENT_OTHER): Payer: Medicaid Other | Admitting: Oncology

## 2018-11-02 ENCOUNTER — Inpatient Hospital Stay: Payer: Medicaid Other

## 2018-11-02 ENCOUNTER — Encounter: Payer: Self-pay | Admitting: Oncology

## 2018-11-02 ENCOUNTER — Inpatient Hospital Stay: Payer: Medicaid Other | Attending: Oncology

## 2018-11-02 ENCOUNTER — Inpatient Hospital Stay: Payer: Medicaid Other | Admitting: Hospice and Palliative Medicine

## 2018-11-02 VITALS — BP 132/78 | HR 121 | Temp 98.6°F | Resp 20

## 2018-11-02 DIAGNOSIS — C3412 Malignant neoplasm of upper lobe, left bronchus or lung: Secondary | ICD-10-CM

## 2018-11-02 DIAGNOSIS — J449 Chronic obstructive pulmonary disease, unspecified: Secondary | ICD-10-CM

## 2018-11-02 DIAGNOSIS — B370 Candidal stomatitis: Secondary | ICD-10-CM

## 2018-11-02 DIAGNOSIS — J984 Other disorders of lung: Secondary | ICD-10-CM

## 2018-11-02 DIAGNOSIS — Z5111 Encounter for antineoplastic chemotherapy: Secondary | ICD-10-CM

## 2018-11-02 DIAGNOSIS — R531 Weakness: Secondary | ICD-10-CM

## 2018-11-02 DIAGNOSIS — R059 Cough, unspecified: Secondary | ICD-10-CM

## 2018-11-02 DIAGNOSIS — R0981 Nasal congestion: Secondary | ICD-10-CM

## 2018-11-02 DIAGNOSIS — M069 Rheumatoid arthritis, unspecified: Secondary | ICD-10-CM

## 2018-11-02 DIAGNOSIS — C3490 Malignant neoplasm of unspecified part of unspecified bronchus or lung: Secondary | ICD-10-CM

## 2018-11-02 DIAGNOSIS — Z87891 Personal history of nicotine dependence: Secondary | ICD-10-CM

## 2018-11-02 DIAGNOSIS — E860 Dehydration: Secondary | ICD-10-CM | POA: Diagnosis not present

## 2018-11-02 DIAGNOSIS — Z5112 Encounter for antineoplastic immunotherapy: Secondary | ICD-10-CM

## 2018-11-02 DIAGNOSIS — R21 Rash and other nonspecific skin eruption: Secondary | ICD-10-CM

## 2018-11-02 DIAGNOSIS — E875 Hyperkalemia: Secondary | ICD-10-CM

## 2018-11-02 DIAGNOSIS — F419 Anxiety disorder, unspecified: Secondary | ICD-10-CM

## 2018-11-02 DIAGNOSIS — Z79899 Other long term (current) drug therapy: Secondary | ICD-10-CM | POA: Insufficient documentation

## 2018-11-02 DIAGNOSIS — R05 Cough: Secondary | ICD-10-CM | POA: Diagnosis not present

## 2018-11-02 DIAGNOSIS — R600 Localized edema: Secondary | ICD-10-CM

## 2018-11-02 DIAGNOSIS — D72819 Decreased white blood cell count, unspecified: Secondary | ICD-10-CM

## 2018-11-02 DIAGNOSIS — Z95828 Presence of other vascular implants and grafts: Secondary | ICD-10-CM

## 2018-11-02 LAB — CBC WITH DIFFERENTIAL/PLATELET
Abs Immature Granulocytes: 0.61 10*3/uL — ABNORMAL HIGH (ref 0.00–0.07)
Basophils Absolute: 0.1 10*3/uL (ref 0.0–0.1)
Basophils Relative: 0 %
Eosinophils Absolute: 0 10*3/uL (ref 0.0–0.5)
Eosinophils Relative: 0 %
HEMATOCRIT: 26.8 % — AB (ref 36.0–46.0)
Hemoglobin: 8.2 g/dL — ABNORMAL LOW (ref 12.0–15.0)
Immature Granulocytes: 3 %
Lymphocytes Relative: 4 %
Lymphs Abs: 0.8 10*3/uL (ref 0.7–4.0)
MCH: 29.9 pg (ref 26.0–34.0)
MCHC: 30.6 g/dL (ref 30.0–36.0)
MCV: 97.8 fL (ref 80.0–100.0)
Monocytes Absolute: 1 10*3/uL (ref 0.1–1.0)
Monocytes Relative: 5 %
NEUTROS PCT: 88 %
NRBC: 0.9 % — AB (ref 0.0–0.2)
Neutro Abs: 19.5 10*3/uL — ABNORMAL HIGH (ref 1.7–7.7)
Platelets: 197 10*3/uL (ref 150–400)
RBC: 2.74 MIL/uL — ABNORMAL LOW (ref 3.87–5.11)
RDW: 16.7 % — ABNORMAL HIGH (ref 11.5–15.5)
WBC: 22 10*3/uL — ABNORMAL HIGH (ref 4.0–10.5)

## 2018-11-02 LAB — COMPREHENSIVE METABOLIC PANEL
ALT: 13 U/L (ref 0–44)
AST: 14 U/L — ABNORMAL LOW (ref 15–41)
Albumin: 2.3 g/dL — ABNORMAL LOW (ref 3.5–5.0)
Alkaline Phosphatase: 93 U/L (ref 38–126)
Anion gap: 3 — ABNORMAL LOW (ref 5–15)
BUN: 30 mg/dL — ABNORMAL HIGH (ref 8–23)
CALCIUM: 8.8 mg/dL — AB (ref 8.9–10.3)
CO2: 23 mmol/L (ref 22–32)
Chloride: 108 mmol/L (ref 98–111)
Creatinine, Ser: 0.88 mg/dL (ref 0.44–1.00)
GFR calc non Af Amer: >60 mL/min (ref >60)
Glucose, Bld: 172 mg/dL — ABNORMAL HIGH (ref 70–99)
Potassium: 5.6 mmol/L — ABNORMAL HIGH (ref 3.5–5.1)
SODIUM: 134 mmol/L — AB (ref 135–145)
Total Bilirubin: 0.4 mg/dL (ref 0.3–1.2)
Total Protein: 8 g/dL (ref 6.5–8.1)

## 2018-11-02 LAB — INFLUENZA PANEL BY PCR (TYPE A & B)
Influenza A By PCR: NEGATIVE
Influenza B By PCR: NEGATIVE

## 2018-11-02 MED ORDER — SODIUM CHLORIDE 0.9 % IV SOLN
Freq: Once | INTRAVENOUS | Status: AC
  Administered 2018-11-02: 15:00:00 via INTRAVENOUS
  Filled 2018-11-02: qty 250

## 2018-11-02 MED ORDER — NYSTATIN 100000 UNIT/ML MT SUSP: 5.0000 mL | Freq: Four times a day (QID) | OROMUCOSAL | 0 refills | Status: AC

## 2018-11-02 MED ORDER — FLUCONAZOLE IN SODIUM CHLORIDE 400-0.9 MG/200ML-% IV SOLN
400.0000 mg | INTRAVENOUS | Status: DC
  Administered 2018-11-02: 400 mg via INTRAVENOUS
  Filled 2018-11-02 (×2): qty 200

## 2018-11-02 MED ORDER — OXYCODONE HCL 10 MG PO TABS: 10.0000 mg | ORAL_TABLET | Freq: Four times a day (QID) | ORAL | 0 refills | Status: DC | PRN

## 2018-11-02 MED ORDER — SODIUM CHLORIDE 0.9% FLUSH
10.0000 mL | Freq: Once | INTRAVENOUS | Status: AC | PRN
  Administered 2018-11-02: 10 mL
  Filled 2018-11-02: qty 10

## 2018-11-02 MED ORDER — AZITHROMYCIN 250 MG PO TABS: ORAL_TABLET | ORAL | 0 refills | Status: DC

## 2018-11-02 MED ORDER — HEPARIN SOD (PORK) LOCK FLUSH 100 UNIT/ML IV SOLN
500.0000 [IU] | Freq: Once | INTRAVENOUS | Status: AC | PRN
  Administered 2018-11-02: 500 [IU]

## 2018-11-02 NOTE — Progress Notes (Signed)
Patient here for follow up. Pt went to hospital last week and blood and fluids. Continues to feel weak and tired. Pt has increased shortness of breath with mild activity. Patient with edema and rash to bilateral legs.

## 2018-11-03 NOTE — Progress Notes (Signed)
Hematology/Oncology follow up note [facility] Telephone:([phone] [phone]) [phone]   Patient Care Team: Glendon Axe, MD as PCP - General (Internal Medicine) Telford Nab, RN as Registered Nurse  REFERRING PROVIDER: Dr. Felicie Morn REASON FOR VISIT Follow up for treatment of  lung cancer  HISTORY OF PRESENTING ILLNESS:  Tamara Parrish is a  63 y.o.  female with PMH listed below who was referred to me for evaluation of newly diagnosed lung cancer. Patient was admitted at the end of June and beginning of July for 2 times due to recurrent community-acquired pneumonia despite treating with antibiotics.  CT skin showed a left upper lobe consolidation suspicious malignancy enlarged bilateral hilar and mediastinal lymph nodes.  Patient was referred to see pulmonology Dr. Felicie Morn.   CT chest7/08/2017, in comparison with recent chest x-rays>>imaging personally reviewed, there are fibrotic changes of the lungs bibasilarly, greatest in the left, these are also seen in the lingula peripherally, right apex. There is masslike consolidation in the apical posterior segment of the left upper lobe. There is significant lymphadenopathy seen in the right hilar, subcarinal and right paratracheal area. There is a pulmonary arterial enlargement suggestive of pulmonary hypertension. Opacification of the left upper lobe appears advanced in comparison to previous chest x-ray 2 weeks prior 05/05/2018 CT chest,  with interval increase in size of large area of masslike consolidation within the left upper lobe which now appears to involve the adjacent chest wall as well as the left third and fourth ribs.  Mediastinal and hilar adenopathy, Patient underwent E bus bronchoscopy with E bus guided biopsy. Left upper lobe lavage was atypical cells consistent with squamous cell carcinoma. Left upper lobe brushing is positive for squamous cell carcinoma Subcarinal lymph node biopsy  negative for malignancy Right hilar lymph node biopsy negative for malignancy. Left upper lobe transbronchial biopsy positive for squamous cell carcinoma. Patient was referred to cancer center to establish cancer care with me. Today patient was accompanied by multiple family members including daughters and partner.  Denies any headache, shortness of breath, chest pain, abdominal pain, leg swelling.fever or chill.  Has history of rheumatoid arthritis follows up with Dr. Meda Coffee at [facility] clinic Patient is on methotrexate  weekly, and  Remicade treatment. Reports persistent left posterior chest wall pain, pleuritic, i worsened with deep breath. She lives with daughters  # No enough tissue for Foundation one CDx  liquid biopsy showed  SE83 splice site [id number]  no targetable mutation.   # 08/22/2018 CT scan showed slightly interval decrease in the size of large left upper lobe lung mass with evidence of necrotic changes and a cavitations.  Interval decrease in size of pulmonary nodules in the left lung and no new progressive findings are identified.  Some interval sclerosis healing of directly invaded left third and fourth ribs with resolution of associated soft tissue components. Smaller mediastinal and hilar lymph nodes.  # Status post palliative radiation 07/18/2018. Status post 6 cycles of carboplatin and Taxol. 10/04/2018 CT scan showed relatively similar appearance of left upper lobe partially necrotic/cavitary mass. Although majority of the pulmonary nodules are similar.  A lingular nodule has significantly enlarged, probably from 7 mm previously Subcarinal nodes measuring 11 mm compared to 8 mm on prior image. Decision was made to add immunotherapy Keytruda after discussing with Rheumatology Dr.Bock who felt patient's RA has been stable and well controlled.    INTERVAL HISTORY Tamara Parrish is a 63 y.o. female who has above history reviewed by me today  presents for assessment  prior to chemotherapy for stage IV non-small cell lung cancer  Patient was accompanied by 2 daughters.  Her appointment was at 8:30 AM and patient showed up around 10:30 Am.  She was recently hospitalized from 10/26/2018-  10/28/2018 due to symptomatic anemia, shortness of breath, acute kidney failure, hyperkalemia. Patient received 1 unit PRBC transfusion and repeat hemoglobin was 8.  AKI was thought to be secondary to decreased oral intake, she was given IV fluid.  Potassium level normalized to 4.4.  #Today patient reports feeling tired and weak.  She has nasal congestion cough."  Feels like I am having a flu".  Denies any urinary symptoms.  Very poor appetite. Lower extremity edema and rash around her ankles. #Reports having more pain in her head joints.  Takes oxycodone 10 mg every 6 hours as needed.  Request refills.  Review of Systems  Constitutional: Positive for appetite change, fatigue and unexpected weight change. Negative for chills and fever.  HENT:   Negative for hearing loss and voice change.        Nasal congestion  Eyes: Negative for eye problems.  Respiratory: Positive for cough. Negative for chest tightness.   Cardiovascular: Negative for chest pain.  Gastrointestinal: Negative for abdominal distention, abdominal pain and blood in stool.  Endocrine: Negative for hot flashes.  Genitourinary: Negative for difficulty urinating and frequency.   Musculoskeletal: Positive for arthralgias.  Skin: Negative for itching and rash.       Right anterior chest wall Mediport, no erythema or discharges.  Neurological: Negative for extremity weakness.  Hematological: Negative for adenopathy.  Psychiatric/Behavioral: Negative for confusion. The patient is nervous/anxious.     Marland Kitchen     MEDICAL HISTORY:  Past Medical History:  Diagnosis Date  . Cancer ([place])    lung ca DX 2019  . Collagen vascular disease ([facility])    RA  . DM2 (diabetes mellitus, type 2) ([place])   . Dyspnea   . Hyperlipemia    . Hypertension   . Osteomyelitis ([place])   . Pneumonia   . RA (rheumatoid arthritis) ([place])   . Rheumatoid arthritis ([place])     SURGICAL HISTORY: Past Surgical History:  Procedure Laterality Date  . ENDOBRONCHIAL ULTRASOUND N/A 05/13/2018   Procedure: ENDOBRONCHIAL ULTRASOUND;  Surgeon: [name], MD;  Location: [facility] ORS;  Service: Pulmonary;  Laterality: N/A;  . PORTA CATH INSERTION N/A 05/27/2018   Procedure: PORTA CATH INSERTION;  Surgeon: Katha Cabal, MD;  Location: [facility];  Service: Cardiovascular;  Laterality: N/A;  . TOE SURGERY      SOCIAL HISTORY: Social History   Socioeconomic History  . Marital status: Single    Spouse name: Not on file  . Number of children: 2  . Years of education: Not on file  . Highest education level: Not on file  Occupational History  . Not on file  Social Needs  . Financial resource strain: Not very hard  . Food insecurity:    Worry: Never true    Inability: Never true  . Transportation needs:    Medical: No    Non-medical: No  Tobacco Use  . Smoking status: Former Smoker    Packs/day: 1.00    Years: 40.00    Pack years: 40.00    Types: Cigarettes    Last attempt to quit: 11/22/2017    Years since quitting: 0.9  . Smokeless tobacco: Never Used  . Tobacco comment: quit 2 weeks ago  Substance and Sexual  Activity  . Alcohol use: Never    Frequency: Never  . Drug use: Never  . Sexual activity: Not on file  Lifestyle  . Physical activity:    Days per week: 0 days    Minutes per session: Not on file  . Stress: To some extent  Relationships  . Social connections:    Talks on phone: Three times a week    Gets together: More than three times a week    Attends religious service: Not on file    Active member of club or organization: Not on file    Attends meetings of clubs or organizations: Not on file    Relationship status: Not on file  . Intimate partner violence:    Fear of current or ex partner: No     Emotionally abused: No    Physically abused: No    Forced sexual activity: No  Other Topics Concern  . Not on file  Social History Narrative   Living at home with daughter.  Ambulates with a walker at baseline.    FAMILY HISTORY: Family History  Problem Relation Age of Onset  . Diabetes Mother   . Lung cancer Father     ALLERGIES:  is allergic to ibuprofen and penicillins.  MEDICATIONS:  Current Outpatient Medications  Medication Sig Dispense Refill  . albuterol (PROVENTIL HFA;VENTOLIN HFA) 108 (90 Base) MCG/ACT inhaler Inhale 2 puffs into the lungs every 6 (six) hours as needed for wheezing or shortness of breath. 1 Inhaler 2  . amLODipine (NORVASC) 5 MG tablet Take 5 mg by mouth daily.  3  . aspirin 81 MG chewable tablet Chew 1 tablet by mouth daily.    Marland Kitchen atorvastatin (LIPITOR) 40 MG tablet Take 40 mg by mouth daily.  3  . benzonatate (TESSALON PERLES) 100 MG capsule Take 1 capsule (100 mg total) by mouth 3 (three) times daily as needed for up to 7 days for cough. 20 capsule 0  . budesonide-formoterol (SYMBICORT) 160-4.5 MCG/ACT inhaler Inhale 2 puffs into the lungs 2 (two) times daily. 10.2 g 2  . dexamethasone (DECADRON) 4 MG tablet Take 2 tablets (8 mg total) by mouth daily. Start the day after chemotherapy for 2 days. 30 tablet 1  . feeding supplement, GLUCERNA SHAKE, (GLUCERNA SHAKE) LIQD Take 237 mLs by mouth 3 (three) times daily between meals for 14 days. 9954 mL 0  . ferrous sulfate 325 (65 FE) MG EC tablet Take 1 tablet (325 mg total) by mouth 2 (two) times daily with a meal. 60 tablet 3  . folic acid (FOLVITE) 1 MG tablet Take 1 mg by mouth daily.  11  . hydroxychloroquine (PLAQUENIL) 200 MG tablet 1 tab twice a day, 90 days    . Insulin Glargine (BASAGLAR KWIKPEN) 100 UNIT/ML SOPN Inject 38 Units into the skin every morning.   12  . lidocaine-prilocaine (EMLA) cream Apply to affected area once 30 g 3  . lisinopril (PRINIVIL,ZESTRIL) 40 MG tablet Take 40 mg by mouth  daily.  3  . metFORMIN (GLUCOPHAGE) 1000 MG tablet Take 1,000 mg by mouth 2 (two) times daily.  5  . metoprolol tartrate (LOPRESSOR) 25 MG tablet Take 25 mg by mouth 2 (two) times daily.  3  . mirtazapine (REMERON) 7.5 MG tablet Take 1 tablet (7.5 mg total) by mouth at bedtime. 30 tablet 3  . Multiple Vitamin (MULTIVITAMIN WITH MINERALS) TABS tablet Take 1 tablet by mouth daily for 30 days. 30 tablet 0  . NARCAN  4 MG/0.1ML LIQD nasal spray kit CALL 911. ADMINISTER A SINGLE SPRAY OF NARCAN IN ONE NOSTRIL. REPEAT EVERY 3 MINUTES AS NEEDED IF NO OR MINIMAL RESPONSE.    Marland Kitchen omeprazole (PRILOSEC) 20 MG capsule Take 1 capsule (20 mg total) by mouth daily. 90 capsule 0  . ondansetron (ZOFRAN) 8 MG tablet Take 1 tablet (8 mg total) by mouth 2 (two) times daily as needed for refractory nausea / vomiting. Start on day 3 after chemo. 30 tablet 2  . Oxycodone HCl 10 MG TABS Take 1 tablet (10 mg total) by mouth every 6 (six) hours as needed for up to 60 doses. 60 tablet 0  . predniSONE (DELTASONE) 20 MG tablet Take 1 tablet (20 mg total) by mouth 2 (two) times daily with a meal for 6 days. 12 tablet 0  . prochlorperazine (COMPAZINE) 10 MG tablet Take 1 tablet (10 mg total) by mouth every 6 (six) hours as needed (Nausea or vomiting). 30 tablet 1  . sucralfate (CARAFATE) 1 g tablet Take 1 tablet (1 g total) by mouth 3 (three) times daily for 5 days. Dissolve in 3-4 tbsp warm water, swish and swallow 15 tablet 0  . triamterene-hydrochlorothiazide (DYAZIDE) 37.5-25 MG capsule Take 1 capsule by mouth daily.  3  . azithromycin (ZITHROMAX) 250 MG tablet Take 2 tabs on day 1, then 1 tab daily until finished. 6 each 0  . nystatin (MYCOSTATIN) 100000 UNIT/ML suspension Take 5 mLs (500,000 Units total) by mouth 4 (four) times daily. 473 mL 0   No current facility-administered medications for this visit.      PHYSICAL EXAMINATION: ECOG PERFORMANCE STATUS: 1 - Symptomatic but completely ambulatory Vitals:   11/02/18 1100   BP: 132/78  Pulse: (!) 121  Resp: 20  Temp: 98.6 F (37 C)  SpO2: 90%   Filed Weights   Physical Exam Constitutional:      General: She is not in acute distress. HENT:     Head: Normocephalic and atraumatic.     Mouth/Throat:     Comments: Thrush Eyes:     General: No scleral icterus.    Pupils: Pupils are equal, round, and reactive to light.  Neck:     Musculoskeletal: Normal range of motion and neck supple.  Cardiovascular:     Rate and Rhythm: Normal rate and regular rhythm.     Heart sounds: Murmur present.  Pulmonary:     Effort: Pulmonary effort is normal. No respiratory distress.     Breath sounds: No wheezing.     Comments: Decreased breath sounds bilaterally.  Left lower lobe rhonchi Abdominal:     General: Bowel sounds are normal. There is no distension.     Palpations: Abdomen is soft. There is no mass.     Tenderness: There is no abdominal tenderness.  Musculoskeletal: Normal range of motion.        General: No deformity.  Skin:    General: Skin is warm and dry.     Findings: No erythema or rash.  Neurological:     Mental Status: She is alert and oriented to person, place, and time.     Cranial Nerves: No cranial nerve deficit.     Coordination: Coordination normal.  Psychiatric:        Behavior: Behavior normal.        Thought Content: Thought content normal.        LABORATORY DATA:  I have reviewed the data as listed Lab Results  Component Value Date  WBC 22.0 (H) 11/02/2018   HGB 8.2 (L) 11/02/2018   HCT 26.8 (L) 11/02/2018   MCV 97.8 11/02/2018   PLT 197 11/02/2018   Recent Labs    09/15/18 0836 10/06/18 0910 10/26/18 1521  10/27/18 0438 10/28/18 0548 11/02/18 1032  NA 142 139 136  --  136 134* 134*  K 4.3 4.2 6.3*   < > 4.4 4.4 5.6*  CL 112* 108 105  --  107 107 108  CO2 22 25 19*  --  21* 21* 23  GLUCOSE 189* 175* 132*  --  59* 109* 172*  BUN 24* 17 45*  --  39* 33* 30*  CREATININE 1.01* 0.95 1.86*  --  1.23* 1.15* 0.88   CALCIUM 8.3* 8.4* 8.1*  --  7.7* 7.4* 8.8*  GFRNONAA 59* >60 28*  --  46* 50* >60  GFRAA >60 >60 33*  --  54* 58* >60  PROT 7.2 7.5  --   --   --   --  8.0  ALBUMIN 3.1* 3.0* 2.4*  --   --   --  2.3*  AST 17 18  --   --   --   --  14*  ALT 9 10  --   --   --   --  13  ALKPHOS 81 90  --   --   --   --  93  BILITOT 0.4 0.4  --   --   --   --  0.4   < > = values in this interval not displayed.   Iron/TIBC/Ferritin/ %Sat    Component Value Date/Time   IRON 27 (L) 05/25/2018 1207   TIBC 241 (L) 05/25/2018 1207   FERRITIN 181 05/25/2018 1207   IRONPCTSAT 11 05/25/2018 1207   RADIOGRAPHIC STUDIES: I have personally reviewed the radiological images as listed and agreed with the findings in the report.  05/24/2018 PET scan  1. Highly hypermetabolic left upper lobe mass, maximum SUV 16.9, with chest wall invasion of the left third and fourth ribs and into the third intercostal space. 2. Separate pleural tumor deposit on the left at the T3-4 level medially near the neural foramen. 3. Hypermetabolic AP window lymph nodes. Probable left hilar lymph nodes confluent with the mass. Separate tumor nodules are noted in the left upper lobe. 4. Hypermetabolic sacrococcygeal junction without a well-defined lesion, probably from a fracture, less likely from early metastatic disease.   ASSESSMENT & PLAN:  1. Cough   2. Dehydration   3. Malignant neoplasm of lung, unspecified laterality, unspecified part of lung ([place])   4. Rheumatoid arthritis, involving unspecified site, unspecified rheumatoid factor presence ([place])   5. 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  See below.  #Upper respiratory symptoms, in the context of COPD,  Check influenza.  Stat chest x-ray Influenza negative.  Chest x-ray was independently reviewed and discussed with patient.  Stable appearance of the chest since 10/26/2018.  Extensive chronic lung disease.  Tentative scarring  in the left hemithorax as demonstrated on the prior chest x-ray and prior chest CT. She is afebrile. Leukocytosis likely due to recent prednisone use.  Prescribed azithromycin 5 days course for empiric treatment for underlying bronchitis.  #Oral thrush, proceed with 1 dose of IV Diflucan today. #Poor oral intake, dehydration, proceed with 1 L of IV normal saline. # Hyperkalemia, potassium 5.6.  Family reports that patient has been eating a lot  of bananas.  Advised patient to stop potassium rich food including bananas.  Recommend patient to return to clinic tomorrow to have blood retested and to trend potassium level.  She declined.  She agrees to return to clinic on Monday 3/16/ 2020 for repeat labs, MD assessment  #Rheumatoid arthritis, off methotrexate.  Reports symptom is getting worse.  Requiring more pain medication.  She got a course of prednisone treatments for arthritis flare.  Continue Plaquenil.  She was previously on oxycodone 5 mg every 12 hours as needed.  After discharge from hospital, she got a new prescription of 20 tablets oxycodone 10 mg every 6 hours.  She request refills.  She reports she has used up all of them and she showed me and empty bottle.  She feels the new regimen has helped with her arthritic pain. I sent prescription of oxycodone 10 mg every 6 hours as needed for pain dispense 60 tablets to pharmacy. Continue to follow-up with palliative care.  She has an appointment today but no showed.  Return of visit: 1 week for lab and md assessment. . Orders Placed This Encounter  Procedures  . DG Chest 2 View    Standing Status:   Future    Number of Occurrences:   1    Standing Expiration Date:   11/02/2019    Order Specific Question:   Reason for Exam (SYMPTOM  OR DIAGNOSIS REQUIRED)    Answer:   cough    Order Specific Question:   Preferred imaging location?    Answer:   [facility]    Order Specific Question:   Radiology Contrast Protocol - do NOT remove file  path    Answer:   \\[email or web address]s.pdf  . DG Chest 2 View    Standing Status:   Future    Standing Expiration Date:   11/02/2019    Order Specific Question:   Reason for Exam (SYMPTOM  OR DIAGNOSIS REQUIRED)    Answer:   cough    Order Specific Question:   Preferred imaging location?    Answer:   [facility]    Order Specific Question:   Radiology Contrast Protocol - do NOT remove file path    Answer:   \\[email or web address]\DXFluoroContrastProtocols.pdf  . Influenza panel by PCR (type A & B)    Standing Status:   Standing    Number of Occurrences:   1  . Droplet precaution    Standing Status:   Standing    Number of Occurrences:   1  We spent sufficient time to discuss many aspect of care, questions were answered to patient's satisfaction. Total face to face encounter time for this patient visit was 40 min. >50% of the time was  spent in counseling and coordination of care.   Earlie Server, MD, PhD Hematology Oncology [facility] at [facility] Pager- [phone] 11/03/2018

## 2018-11-07 ENCOUNTER — Ambulatory Visit
Admission: RE | Admit: 2018-11-07 | Discharge: 2018-11-07 | Disposition: A | Discharge status: Home / Self Care | Payer: Medicaid Other | Source: Ambulatory Visit | Attending: Oncology | Admitting: Oncology

## 2018-11-07 ENCOUNTER — Encounter: Payer: Self-pay | Admitting: Oncology

## 2018-11-07 ENCOUNTER — Other Ambulatory Visit: Payer: Self-pay

## 2018-11-07 ENCOUNTER — Inpatient Hospital Stay: Payer: Medicaid Other

## 2018-11-07 ENCOUNTER — Telehealth: Payer: Self-pay | Admitting: *Deleted

## 2018-11-07 ENCOUNTER — Inpatient Hospital Stay (HOSPITAL_BASED_OUTPATIENT_CLINIC_OR_DEPARTMENT_OTHER): Payer: Medicaid Other | Admitting: Oncology

## 2018-11-07 ENCOUNTER — Ambulatory Visit: Admission: RE | Admit: 2018-11-07 | Payer: Medicaid Other | Source: Ambulatory Visit

## 2018-11-07 ENCOUNTER — Inpatient Hospital Stay: Payer: Medicaid Other | Admitting: Hospice and Palliative Medicine

## 2018-11-07 VITALS — BP 106/65 | HR 88 | Temp 96.6°F | Wt 187.3 lb

## 2018-11-07 DIAGNOSIS — R0602 Shortness of breath: Secondary | ICD-10-CM

## 2018-11-07 DIAGNOSIS — R05 Cough: Secondary | ICD-10-CM | POA: Diagnosis not present

## 2018-11-07 DIAGNOSIS — M7989 Other specified soft tissue disorders: Secondary | ICD-10-CM

## 2018-11-07 DIAGNOSIS — M069 Rheumatoid arthritis, unspecified: Secondary | ICD-10-CM | POA: Diagnosis not present

## 2018-11-07 DIAGNOSIS — R059 Cough, unspecified: Secondary | ICD-10-CM

## 2018-11-07 DIAGNOSIS — E860 Dehydration: Secondary | ICD-10-CM

## 2018-11-07 DIAGNOSIS — J449 Chronic obstructive pulmonary disease, unspecified: Secondary | ICD-10-CM | POA: Diagnosis not present

## 2018-11-07 DIAGNOSIS — Z5112 Encounter for antineoplastic immunotherapy: Secondary | ICD-10-CM

## 2018-11-07 DIAGNOSIS — C3412 Malignant neoplasm of upper lobe, left bronchus or lung: Secondary | ICD-10-CM

## 2018-11-07 DIAGNOSIS — C3490 Malignant neoplasm of unspecified part of unspecified bronchus or lung: Secondary | ICD-10-CM

## 2018-11-07 DIAGNOSIS — Z95828 Presence of other vascular implants and grafts: Secondary | ICD-10-CM

## 2018-11-07 DIAGNOSIS — F419 Anxiety disorder, unspecified: Secondary | ICD-10-CM

## 2018-11-07 DIAGNOSIS — B370 Candidal stomatitis: Secondary | ICD-10-CM

## 2018-11-07 DIAGNOSIS — Z5111 Encounter for antineoplastic chemotherapy: Secondary | ICD-10-CM

## 2018-11-07 DIAGNOSIS — Z9981 Dependence on supplemental oxygen: Secondary | ICD-10-CM

## 2018-11-07 LAB — CBC WITH DIFFERENTIAL/PLATELET
Abs Immature Granulocytes: 0.08 10*3/uL — ABNORMAL HIGH (ref 0.00–0.07)
BASOS ABS: 0 10*3/uL (ref 0.0–0.1)
Basophils Relative: 0 %
Eosinophils Absolute: 0.2 10*3/uL (ref 0.0–0.5)
Eosinophils Relative: 1 %
HCT: 26.6 % — ABNORMAL LOW (ref 36.0–46.0)
Hemoglobin: 8 g/dL — ABNORMAL LOW (ref 12.0–15.0)
Immature Granulocytes: 1 %
Lymphocytes Relative: 4 %
Lymphs Abs: 0.6 10*3/uL — ABNORMAL LOW (ref 0.7–4.0)
MCH: 29.7 pg (ref 26.0–34.0)
MCHC: 30.1 g/dL (ref 30.0–36.0)
MCV: 98.9 fL (ref 80.0–100.0)
Monocytes Absolute: 0.8 10*3/uL (ref 0.1–1.0)
Monocytes Relative: 5 %
Neutro Abs: 14 10*3/uL — ABNORMAL HIGH (ref 1.7–7.7)
Neutrophils Relative %: 89 %
Platelets: 202 10*3/uL (ref 150–400)
RBC: 2.69 MIL/uL — AB (ref 3.87–5.11)
RDW: 16.4 % — ABNORMAL HIGH (ref 11.5–15.5)
WBC: 15.5 10*3/uL — AB (ref 4.0–10.5)
nRBC: 0 % (ref 0.0–0.2)

## 2018-11-07 LAB — COMPREHENSIVE METABOLIC PANEL
ALT: 9 U/L (ref 0–44)
AST: 17 U/L (ref 15–41)
Albumin: 2.3 g/dL — ABNORMAL LOW (ref 3.5–5.0)
Alkaline Phosphatase: 77 U/L (ref 38–126)
Anion gap: 3 — ABNORMAL LOW (ref 5–15)
BUN: 28 mg/dL — ABNORMAL HIGH (ref 8–23)
CO2: 22 mmol/L (ref 22–32)
Calcium: 8.1 mg/dL — ABNORMAL LOW (ref 8.9–10.3)
Chloride: 106 mmol/L (ref 98–111)
Creatinine, Ser: 1.49 mg/dL — ABNORMAL HIGH (ref 0.44–1.00)
GFR calc Af Amer: 43 mL/min — ABNORMAL LOW (ref >60)
GFR calc non Af Amer: 37 mL/min — ABNORMAL LOW (ref >60)
Glucose, Bld: 113 mg/dL — ABNORMAL HIGH (ref 70–99)
Potassium: 4.5 mmol/L (ref 3.5–5.1)
Sodium: 131 mmol/L — ABNORMAL LOW (ref 135–145)
Total Bilirubin: 0.5 mg/dL (ref 0.3–1.2)
Total Protein: 7.7 g/dL (ref 6.5–8.1)

## 2018-11-07 LAB — TSH: TSH: 0.606 u[IU]/mL (ref 0.350–4.500)

## 2018-11-07 MED ORDER — PREDNISONE 5 MG PO TABS: 5.0000 mg | ORAL_TABLET | Freq: Every day | ORAL | 0 refills | Status: DC

## 2018-11-07 MED ORDER — HEPARIN SOD (PORK) LOCK FLUSH 100 UNIT/ML IV SOLN
500.0000 [IU] | Freq: Once | INTRAVENOUS | Status: AC
  Administered 2018-11-07: 500 [IU] via INTRAVENOUS
  Filled 2018-11-07: qty 5

## 2018-11-07 MED ORDER — SODIUM CHLORIDE 0.9 % IV SOLN
Freq: Once | INTRAVENOUS | Status: AC
  Administered 2018-11-07: 11:00:00 via INTRAVENOUS
  Filled 2018-11-07: qty 250

## 2018-11-07 MED ORDER — SODIUM CHLORIDE 0.9% FLUSH
10.0000 mL | INTRAVENOUS | Status: DC | PRN
  Administered 2018-11-07: 10 mL via INTRAVENOUS
  Filled 2018-11-07: qty 10

## 2018-11-07 MED ORDER — IOPAMIDOL (ISOVUE-370) INJECTION 76%
60.0000 mL | Freq: Once | INTRAVENOUS | Status: AC | PRN
  Administered 2018-11-07: 60 mL via INTRAVENOUS

## 2018-11-07 NOTE — Telephone Encounter (Signed)
Called report  IMPRESSION: No femoropopliteal and no calf DVT in the visualized calf veins. If clinical symptoms are inconsistent or if there are persistent or worsening symptoms, further imaging (possibly involving the iliac veins) may be warranted.   Electronically Signed   By: Lucrezia Europe M.D.   On: 11/07/2018 13:34

## 2018-11-07 NOTE — Progress Notes (Signed)
Hematology/Oncology follow up note [facility] [phone] [phone]   Patient Care Team: Glendon Axe, MD as PCP - General (Internal Medicine) Telford Nab, RN as Registered Nurse  REFERRING PROVIDER: Dr. Felicie Morn REASON FOR VISIT Follow up for treatment of  lung cancer  HISTORY OF PRESENTING ILLNESS:  Tamara Parrish is a  63 y.o.  female with PMH listed below who was referred to me for evaluation of newly diagnosed lung cancer. Patient was admitted at the end of June and beginning of July for 2 times due to recurrent community-acquired pneumonia despite treating with antibiotics.  CT skin showed a left upper lobe consolidation suspicious malignancy enlarged bilateral hilar and mediastinal lymph nodes.  Patient was referred to see pulmonology Dr. Felicie Morn.   CT chest7/08/2017, in comparison with recent chest x-rays>>imaging personally reviewed, there are fibrotic changes of the lungs bibasilarly, greatest in the left, these are also seen in the lingula peripherally, right apex. There is masslike consolidation in the apical posterior segment of the left upper lobe. There is significant lymphadenopathy seen in the right hilar, subcarinal and right paratracheal area. There is a pulmonary arterial enlargement suggestive of pulmonary hypertension. Opacification of the left upper lobe appears advanced in comparison to previous chest x-ray 2 weeks prior 05/05/2018 CT chest,  with interval increase in size of large area of masslike consolidation within the left upper lobe which now appears to involve the adjacent chest wall as well as the left third and fourth ribs.  Mediastinal and hilar adenopathy, Patient underwent E bus bronchoscopy with E bus guided biopsy. Left upper lobe lavage was atypical cells consistent with squamous cell carcinoma. Left upper lobe brushing is positive for squamous cell carcinoma Subcarinal lymph node biopsy  negative for malignancy Right hilar lymph node biopsy negative for malignancy. Left upper lobe transbronchial biopsy positive for squamous cell carcinoma. Patient was referred to cancer center to establish cancer care with me. Today patient was accompanied by multiple family members including daughters and partner.  Denies any headache, shortness of breath, chest pain, abdominal pain, leg swelling.fever or chill.  Has history of rheumatoid arthritis follows up with Dr. Meda Coffee at [facility] clinic Patient is on methotrexate  weekly, and  Remicade treatment. Reports persistent left posterior chest wall pain, pleuritic, i worsened with deep breath. She lives with daughters  # No enough tissue for Foundation one CDx  liquid biopsy showed  [id number] splice site [id number]  no targetable mutation.   # 08/22/2018 CT scan showed slightly interval decrease in the size of large left upper lobe lung mass with evidence of necrotic changes and a cavitations.  Interval decrease in size of pulmonary nodules in the left lung and no new progressive findings are identified.  Some interval sclerosis healing of directly invaded left third and fourth ribs with resolution of associated soft tissue components. Smaller mediastinal and hilar lymph nodes.  # Status post palliative radiation 07/18/2018. Status post 6 cycles of carboplatin and Taxol. 10/04/2018 CT scan showed relatively similar appearance of left upper lobe partially necrotic/cavitary mass. Although majority of the pulmonary nodules are similar.  A lingular nodule has significantly enlarged, probably from 7 mm previously Subcarinal nodes measuring 11 mm compared to 8 mm on prior image. Decision was made to add immunotherapy Keytruda after discussing with Rheumatology Dr.Bock who felt patient's RA has been stable and well controlled.    INTERVAL HISTORY Tamara Parrish is a 63 y.o. female who has above history reviewed by me today  presents for assessment  prior to chemotherapy for stage IV non-small cell lung cancer, and multiple cancer related symptoms. Patient was seen by me 1 week ago.  During that visit, she reports feeling weak and tired, congested and a cough. Chest x-ray showed chronic scarring of the lung.  No new infiltrates. Patient also had oral rash.  She was given supportive care including IV fluid, Diflucan, outpatient azithromycin 5 days course for empiric treatment of underlying bronchitis.  Today she presents for follow-up.  Today patient was accompanied by 1 of her daughter. She reports finished course of azithromycin.  Feels better. "  I can feel the difference".  Cough is better.  Less congested However she reports feeling more shortness of breath intermittently, and ask for oxygen. She uses oxygen as needed at home.  Continues to have hand and ankle joint swelling and pain.  Patient is on Plaquenil for rheumatoid arthritis.  She also takes oxycodone 10 mg every 6 hours as needed for pain. Appetite is poor.  She has lost weight during the interval.  She refused to have weight measured at last visit.  Comparing to October 06, 2018, she has lost it 9 pounds.  Review of Systems  Constitutional: Positive for appetite change, fatigue and unexpected weight change. Negative for chills and fever.  HENT:   Negative for hearing loss and voice change.   Eyes: Negative for eye problems.  Respiratory: Positive for cough. Negative for chest tightness.   Cardiovascular: Negative for chest pain.  Gastrointestinal: Negative for abdominal distention, abdominal pain and blood in stool.  Endocrine: Negative for hot flashes.  Genitourinary: Negative for difficulty urinating and frequency.   Musculoskeletal: Positive for arthralgias.  Skin: Negative for itching and rash.       Right anterior chest wall Mediport, no erythema or discharges.  Neurological: Negative for extremity weakness.  Hematological: Negative for adenopathy.    Psychiatric/Behavioral: Negative for confusion. The patient is nervous/anxious.     Marland Kitchen     MEDICAL HISTORY:  Past Medical History:  Diagnosis Date   Cancer ([place])    lung ca DX 2019   Collagen vascular disease (HCC)    RA   DM2 (diabetes mellitus, type 2) (HCC)    Dyspnea    Hyperlipemia    Hypertension    Osteomyelitis (HCC)    Pneumonia    RA (rheumatoid arthritis) ([place])    Rheumatoid arthritis ([place])     SURGICAL HISTORY: Past Surgical History:  Procedure Laterality Date   ENDOBRONCHIAL ULTRASOUND N/A 05/13/2018   Procedure: ENDOBRONCHIAL ULTRASOUND;  Surgeon: [name], MD;  Location: [facility];  Service: Pulmonary;  Laterality: N/A;   PORTA CATH INSERTION N/A 05/27/2018   Procedure: PORTA CATH INSERTION;  Surgeon: Katha Cabal, MD;  Location: [facility];  Service: Cardiovascular;  Laterality: N/A;   TOE SURGERY      SOCIAL HISTORY: Social History   Socioeconomic History   Marital status: Single    Spouse name: Not on file   Number of children: 2   Years of education: Not on file   Highest education level: Not on file  Occupational History   Not on file  Social Needs   Financial resource strain: Not very hard   Food insecurity:    Worry: Never true    Inability: Never true   Transportation needs:    Medical: No    Non-medical: No  Tobacco Use   Smoking status: Former Smoker    Packs/day: 1.00  Years: 40.00    Pack years: 40.00    Types: Cigarettes    Last attempt to quit: 11/22/2017    Years since quitting: 0.9   Smokeless tobacco: Never Used   Tobacco comment: quit 2 weeks ago  Substance and Sexual Activity   Alcohol use: Never    Frequency: Never   Drug use: Never   Sexual activity: Not on file  Lifestyle   Physical activity:    Days per week: 0 days    Minutes per session: Not on file   Stress: To some extent  Relationships   Social connections:    Talks on phone: Three times a week     Gets together: More than three times a week    Attends religious service: Not on file    Active member of club or organization: Not on file    Attends meetings of clubs or organizations: Not on file    Relationship status: Not on file   Intimate partner violence:    Fear of current or ex partner: No    Emotionally abused: No    Physically abused: No    Forced sexual activity: No  Other Topics Concern   Not on file  Social History Narrative   Living at home with daughter.  Ambulates with a walker at baseline.    FAMILY HISTORY: Family History  Problem Relation Age of Onset   Diabetes Mother    Lung cancer Father     ALLERGIES:  is allergic to ibuprofen and penicillins.  MEDICATIONS:  Current Outpatient Medications  Medication Sig Dispense Refill   albuterol (PROVENTIL HFA;VENTOLIN HFA) 108 (90 Base) MCG/ACT inhaler Inhale 2 puffs into the lungs every 6 (six) hours as needed for wheezing or shortness of breath. 1 Inhaler 2   amLODipine (NORVASC) 5 MG tablet Take 5 mg by mouth daily.  3   aspirin 81 MG chewable tablet Chew 1 tablet by mouth daily.     atorvastatin (LIPITOR) 40 MG tablet Take 40 mg by mouth daily.  3   budesonide-formoterol (SYMBICORT) 160-4.5 MCG/ACT inhaler Inhale 2 puffs into the lungs 2 (two) times daily. 10.2 g 2   dexamethasone (DECADRON) 4 MG tablet Take 2 tablets (8 mg total) by mouth daily. Start the day after chemotherapy for 2 days. 30 tablet 1   feeding supplement, GLUCERNA SHAKE, (GLUCERNA SHAKE) LIQD Take 237 mLs by mouth 3 (three) times daily between meals for 14 days. 9954 mL 0   ferrous sulfate 325 (65 FE) MG EC tablet Take 1 tablet (325 mg total) by mouth 2 (two) times daily with a meal. 60 tablet 3   folic acid (FOLVITE) 1 MG tablet Take 1 mg by mouth daily.  11   hydroxychloroquine (PLAQUENIL) 200 MG tablet 1 tab twice a day, 90 days     Insulin Glargine (BASAGLAR KWIKPEN) 100 UNIT/ML SOPN Inject 38 Units into the skin every  morning.   12   lidocaine-prilocaine (EMLA) cream Apply to affected area once 30 g 3   lisinopril (PRINIVIL,ZESTRIL) 40 MG tablet Take 40 mg by mouth daily.  3   metFORMIN (GLUCOPHAGE) 1000 MG tablet Take 1,000 mg by mouth 2 (two) times daily.  5   metoprolol tartrate (LOPRESSOR) 25 MG tablet Take 25 mg by mouth 2 (two) times daily.  3   mirtazapine (REMERON) 7.5 MG tablet Take 1 tablet (7.5 mg total) by mouth at bedtime. 30 tablet 3   Multiple Vitamin (MULTIVITAMIN WITH MINERALS) TABS tablet Take  1 tablet by mouth daily for 30 days. 30 tablet 0   NARCAN 4 MG/0.1ML LIQD nasal spray kit CALL 911. ADMINISTER A SINGLE SPRAY OF NARCAN IN ONE NOSTRIL. REPEAT EVERY 3 MINUTES AS NEEDED IF NO OR MINIMAL RESPONSE.     nystatin (MYCOSTATIN) 100000 UNIT/ML suspension Take 5 mLs (500,000 Units total) by mouth 4 (four) times daily. 473 mL 0   omeprazole (PRILOSEC) 20 MG capsule Take 1 capsule (20 mg total) by mouth daily. 90 capsule 0   ondansetron (ZOFRAN) 8 MG tablet Take 1 tablet (8 mg total) by mouth 2 (two) times daily as needed for refractory nausea / vomiting. Start on day 3 after chemo. 30 tablet 2   Oxycodone HCl 10 MG TABS Take 1 tablet (10 mg total) by mouth every 6 (six) hours as needed for up to 60 doses. 60 tablet 0   prochlorperazine (COMPAZINE) 10 MG tablet Take 1 tablet (10 mg total) by mouth every 6 (six) hours as needed (Nausea or vomiting). 30 tablet 1   triamterene-hydrochlorothiazide (DYAZIDE) 37.5-25 MG capsule Take 1 capsule by mouth daily.  3   azithromycin (ZITHROMAX) 250 MG tablet Take 2 tabs on day 1, then 1 tab daily until finished. (Patient not taking: Reported on 11/07/2018) 6 each 0   sucralfate (CARAFATE) 1 g tablet Take 1 tablet (1 g total) by mouth 3 (three) times daily for 5 days. Dissolve in 3-4 tbsp warm water, swish and swallow 15 tablet 0   No current facility-administered medications for this visit.    Facility-Administered Medications Ordered in Other  Visits  Medication Dose Route Frequency Provider Last Rate Last Dose   sodium chloride flush (NS) 0.9 % injection 10 mL  10 mL Intravenous PRN Earlie Server, MD   10 mL at 11/07/18 0936     PHYSICAL EXAMINATION: ECOG PERFORMANCE STATUS: 1 - Symptomatic but completely ambulatory Vitals:   11/07/18 0954  BP: 106/65  Pulse: 88  Temp: (!) 96.6 F (35.9 C)  SpO2: 98%   Filed Weights   11/07/18 0954  Weight: 187 lb 4.8 oz (85 kg)   Physical Exam Constitutional:      General: She is not in acute distress. HENT:     Head: Normocephalic and atraumatic.     Mouth/Throat:     Comments: Thrush Eyes:     General: No scleral icterus.    Pupils: Pupils are equal, round, and reactive to light.  Neck:     Musculoskeletal: Normal range of motion and neck supple.  Cardiovascular:     Rate and Rhythm: Normal rate and regular rhythm.     Heart sounds: Murmur present.  Pulmonary:     Effort: Pulmonary effort is normal. No respiratory distress.     Breath sounds: No wheezing.     Comments: Decreased breath sounds bilaterally.  Left lower lobe rhonchi Abdominal:     General: Bowel sounds are normal. There is no distension.     Palpations: Abdomen is soft. There is no mass.     Tenderness: There is no abdominal tenderness.  Musculoskeletal: Normal range of motion.        General: No deformity.  Skin:    General: Skin is warm and dry.     Findings: No erythema or rash.  Neurological:     Mental Status: She is alert and oriented to person, place, and time.     Cranial Nerves: No cranial nerve deficit.     Coordination: Coordination normal.  Psychiatric:  Behavior: Behavior normal.        Thought Content: Thought content normal.        LABORATORY DATA:  I have reviewed the data as listed Lab Results  Component Value Date   WBC 15.5 (H) 11/07/2018   HGB 8.0 (L) 11/07/2018   HCT 26.6 (L) 11/07/2018   MCV 98.9 11/07/2018   PLT 202 11/07/2018   Recent Labs    10/06/18 0910  10/26/18 1521  10/28/18 0548 11/02/18 1032 11/07/18 0928  NA 139 136   < > 134* 134* 131*  K 4.2 6.3*   < > 4.4 5.6* 4.5  CL 108 105   < > 107 108 106  CO2 25 19*   < > 21* 23 22  GLUCOSE 175* 132*   < > 109* 172* 113*  BUN 17 45*   < > 33* 30* 28*  CREATININE 0.95 1.86*   < > 1.15* 0.88 1.49*  CALCIUM 8.4* 8.1*   < > 7.4* 8.8* 8.1*  GFRNONAA >60 28*   < > 50* >60 37*  GFRAA >60 33*   < > 58* >60 43*  PROT 7.5  --   --   --  8.0 7.7  ALBUMIN 3.0* 2.4*  --   --  2.3* 2.3*  AST 18  --   --   --  14* 17  ALT 10  --   --   --  13 9  ALKPHOS 90  --   --   --  93 77  BILITOT 0.4  --   --   --  0.4 0.5   < > = values in this interval not displayed.   Iron/TIBC/Ferritin/ %Sat    Component Value Date/Time   IRON 27 (L) 05/25/2018 1207   TIBC 241 (L) 05/25/2018 1207   FERRITIN 181 05/25/2018 1207   IRONPCTSAT 11 05/25/2018 1207   RADIOGRAPHIC STUDIES: I have personally reviewed the radiological images as listed and agreed with the findings in the report.  05/24/2018 PET scan  1. Highly hypermetabolic left upper lobe mass, maximum SUV 16.9, with chest wall invasion of the left third and fourth ribs and into the third intercostal space. 2. Separate pleural tumor deposit on the left at the T3-4 level medially near the neural foramen. 3. Hypermetabolic AP window lymph nodes. Probable left hilar lymph nodes confluent with the mass. Separate tumor nodules are noted in the left upper lobe. 4. Hypermetabolic sacrococcygeal junction without a well-defined lesion, probably from a fracture, less likely from early metastatic disease.   ASSESSMENT & PLAN:  1. Malignant neoplasm of lung, unspecified laterality, unspecified part of lung ([place])   2. Cough   3. Shortness of breath   4. Leg swelling   5. Thrush, oral   6. 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  See below.  #Upper respiratory symptoms,  in the context of COPD and lung cancer. Influenza negative.  Chest x-ray showed chronic scarring.  No new infiltrates. Bronchitis appears better after course of antibiotic with azithromycin. Subjectively she feels more shortness of breath, despite having normal pulse ox. Obtain CT chest angiogram to rule out PE Obtain bilateral lower extremity ultrasound rule out DVT. Patient is a febrile, leukocytosis likely secondary to recent prednisone use.  Trending down.  #Oral thrush has resolved.  Continue nystatin oral rinse prophylactically.  #Rheumatoid arthritis, off methotrexate.  Appears to have flaring  symptoms, likely secondary to the use of immunotherapy. Continue Plaquenil.  I will add prednisone 5 mg daily. Continue oxycodone 10 mg every 6 hours as needed for pain.  Continue follow-up and establish care with palliative care. #Fluctuating kidney function.  We will proceed with 1 L of IV fluid today.  Likely secondary to poor oral intake. Discussed with radiology who will proceed with low-dose CT chest angiogram.  She will receive a liter of IV fluid prior to the contrast exposure.  CT image was independently reviewed. No PE, airspace disease in left lung base, atelectasis vs pneumonia.  She has just finished a course of azithromycin. Continue to monitor symptoms.  Venous lower extremities no DVT.   Return of visit: 2 weeks for lab and md assessment. . Orders Placed This Encounter  Procedures   CT Chest Wo Contrast    Standing Status:   Future    Standing Expiration Date:   11/07/2019    Order Specific Question:   ** REASON FOR EXAM (FREE TEXT)    Answer:   shortness of breath    Order Specific Question:   Preferred imaging location?    Answer:   [facility]    Order Specific Question:   Radiology Contrast Protocol - do NOT remove file path    Answer:   \[email or web address]   US Venous Img Lower Bilateral    Standing Status:   Future    Number of  Occurrences:   1    Standing Expiration Date:   11/07/2019    Order Specific Question:   Reason for Exam (SYMPTOM  OR DIAGNOSIS REQUIRED)    Answer:   lower extremities swelling, SOB    Order Specific Question:   Preferred imaging location?    Answer:   [facility]   CT Chest Wo Contrast    Standing Status:   Future    Standing Expiration Date:   11/07/2019    Order Specific Question:   ** REASON FOR EXAM (FREE TEXT)    Answer:   SOB, cough. history of lung cancer    Order Specific Question:   Preferred imaging location?    Answer:   [facility]    Order Specific Question:   Radiology Contrast Protocol - do NOT remove file path    Answer:   \[email or web address]   CT ANGIO CHEST PE W OR WO CONTRAST    Spoke with Ria Comment in [place]; Low dose contrast    Standing Status:   Future    Number of Occurrences:   1    Standing Expiration Date:   02/07/2020    Order Specific Question:   If indicated for the ordered procedure, I authorize the administration of contrast media per Radiology protocol    Answer:   Yes    Order Specific Question:   Preferred imaging location?    Answer:   [facility]    Order Specific Question:   Radiology Contrast Protocol - do NOT remove file path    Answer:   \[email or web address]    Order Specific Question:   ** REASON FOR EXAM (FREE TEXT)    Answer:   Cough/SOB  We spent sufficient time to discuss many aspect of care, questions were answered to patient's satisfaction.    Earlie Server, MD, PhD Hematology Oncology [facility] at [facility] Pager- [phone] 11/07/2018

## 2018-11-07 NOTE — Telephone Encounter (Signed)
Called pt to let he know that her scans today showed no DVT or PE, daughter Velna Hatchet, answered and agreed to notify her mother.  Advised for pt to stay hydrated, we will schedule lab appt for next week and Lab see dr Tasia Catchings and chemo treatment in 2 weeks.   Per Velna Hatchet, pt has become very anxious and possibly depressed, she is tearful and fearful at times, seems to focus on the negative most of the time.  Velna Hatchet has asked for [place] to bring this in discussion at next office visit.

## 2018-11-07 NOTE — Progress Notes (Signed)
Patient here for follow up. Oxygen level is 98% but pt complaining of shortness of breath and requesting oxygen. Feet with edema.

## 2018-11-07 NOTE — Progress Notes (Signed)
Patient had appointment for scans at [facility] location after IVF, left port accessed.  Patient came back to the clinic after visits to have port de-accessed.

## 2018-11-11 ENCOUNTER — Other Ambulatory Visit: Payer: Self-pay | Admitting: Oncology

## 2018-11-11 ENCOUNTER — Telehealth: Payer: Self-pay | Admitting: Oncology

## 2018-11-11 MED ORDER — OXYCODONE HCL 10 MG PO TABS: 10.0000 mg | ORAL_TABLET | Freq: Four times a day (QID) | ORAL | 0 refills | Status: DC | PRN

## 2018-11-11 NOTE — Telephone Encounter (Signed)
Daughter Theadora Rama called and reports that patient's Oxycodone Rx which was written on 11/01/2017 only filled her one week supply due to insurance purpose.  Patient is out of pain medication for a few days.  Requesting refill.  I talked to pharmacy and confirmed with pharmacist.  One week supply of Oxycodone 10mg  Q6h as needed was sent to pharmacy.

## 2018-11-14 ENCOUNTER — Inpatient Hospital Stay: Payer: Medicaid Other

## 2018-11-14 ENCOUNTER — Other Ambulatory Visit: Payer: Self-pay

## 2018-11-14 DIAGNOSIS — M069 Rheumatoid arthritis, unspecified: Secondary | ICD-10-CM

## 2018-11-17 ENCOUNTER — Telehealth: Payer: Self-pay | Admitting: *Deleted

## 2018-11-17 ENCOUNTER — Inpatient Hospital Stay: Payer: Medicaid Other

## 2018-11-17 NOTE — Telephone Encounter (Signed)
Patient daughter Audelia Acton) called and stated that her mother was not able to make her 11/17/18  Lab appt due to her feet been swollen.

## 2018-11-18 ENCOUNTER — Encounter: Payer: Medicaid Other | Admitting: Hospice and Palliative Medicine

## 2018-11-18 ENCOUNTER — Other Ambulatory Visit: Payer: Self-pay | Admitting: Oncology

## 2018-11-18 ENCOUNTER — Telehealth: Payer: Self-pay | Admitting: Oncology

## 2018-11-18 ENCOUNTER — Other Ambulatory Visit: Payer: Self-pay

## 2018-11-18 DIAGNOSIS — F1190 Opioid use, unspecified, uncomplicated: Secondary | ICD-10-CM

## 2018-11-18 MED ORDER — OXYCODONE HCL 10 MG PO TABS: 10.0000 mg | ORAL_TABLET | Freq: Four times a day (QID) | ORAL | 0 refills | Status: DC | PRN

## 2018-11-18 NOTE — Telephone Encounter (Signed)
Patient's daughter Audelia Acton called for refill of patient's pain medication.  Patient has previously informed us not to respond to [facility] phone request for pain medication. Patient prefers Theadora Rama to be the person of contact.  I called Theadora Rama and she informs me that patient currently lives with [facility] for a few weeks. Patient told her that she is having lots of joint pain and needs pain medication.  I refilled patient 3 days supply of oxycodone 10mg  Q6 hours as need. She has appt with me on 3/30. Velna Hatchet will relay information to patient and Audelia Acton Patient has been referred to pain clinic.   Check UDS and comprehensive drug analysis on 11/21/2018/.

## 2018-11-20 ENCOUNTER — Emergency Department: Payer: Medicaid Other

## 2018-11-20 ENCOUNTER — Inpatient Hospital Stay
Admission: EM | Admit: 2018-11-20 | Discharge: 2018-11-22 | DRG: 194 | Disposition: A | Discharge status: Home Health | Payer: Medicaid Other | Attending: Internal Medicine | Admitting: Internal Medicine

## 2018-11-20 ENCOUNTER — Other Ambulatory Visit: Payer: Self-pay

## 2018-11-20 ENCOUNTER — Encounter: Payer: Self-pay | Admitting: Emergency Medicine

## 2018-11-20 DIAGNOSIS — Z9981 Dependence on supplemental oxygen: Secondary | ICD-10-CM | POA: Diagnosis not present

## 2018-11-20 DIAGNOSIS — Z515 Encounter for palliative care: Secondary | ICD-10-CM

## 2018-11-20 DIAGNOSIS — D638 Anemia in other chronic diseases classified elsewhere: Secondary | ICD-10-CM | POA: Diagnosis present

## 2018-11-20 DIAGNOSIS — I10 Essential (primary) hypertension: Secondary | ICD-10-CM | POA: Diagnosis present

## 2018-11-20 DIAGNOSIS — Z801 Family history of malignant neoplasm of trachea, bronchus and lung: Secondary | ICD-10-CM | POA: Diagnosis not present

## 2018-11-20 DIAGNOSIS — Z79891 Long term (current) use of opiate analgesic: Secondary | ICD-10-CM | POA: Diagnosis not present

## 2018-11-20 DIAGNOSIS — Z87891 Personal history of nicotine dependence: Secondary | ICD-10-CM | POA: Diagnosis not present

## 2018-11-20 DIAGNOSIS — Z7952 Long term (current) use of systemic steroids: Secondary | ICD-10-CM | POA: Diagnosis not present

## 2018-11-20 DIAGNOSIS — C799 Secondary malignant neoplasm of unspecified site: Secondary | ICD-10-CM | POA: Diagnosis not present

## 2018-11-20 DIAGNOSIS — Z79899 Other long term (current) drug therapy: Secondary | ICD-10-CM

## 2018-11-20 DIAGNOSIS — Z7982 Long term (current) use of aspirin: Secondary | ICD-10-CM

## 2018-11-20 DIAGNOSIS — Z794 Long term (current) use of insulin: Secondary | ICD-10-CM | POA: Diagnosis not present

## 2018-11-20 DIAGNOSIS — J9611 Chronic respiratory failure with hypoxia: Secondary | ICD-10-CM | POA: Diagnosis present

## 2018-11-20 DIAGNOSIS — E785 Hyperlipidemia, unspecified: Secondary | ICD-10-CM | POA: Diagnosis present

## 2018-11-20 DIAGNOSIS — Z923 Personal history of irradiation: Secondary | ICD-10-CM

## 2018-11-20 DIAGNOSIS — J189 Pneumonia, unspecified organism: Secondary | ICD-10-CM | POA: Diagnosis present

## 2018-11-20 DIAGNOSIS — Z9221 Personal history of antineoplastic chemotherapy: Secondary | ICD-10-CM

## 2018-11-20 DIAGNOSIS — D649 Anemia, unspecified: Secondary | ICD-10-CM | POA: Diagnosis not present

## 2018-11-20 DIAGNOSIS — Z833 Family history of diabetes mellitus: Secondary | ICD-10-CM | POA: Diagnosis not present

## 2018-11-20 DIAGNOSIS — E1142 Type 2 diabetes mellitus with diabetic polyneuropathy: Secondary | ICD-10-CM | POA: Diagnosis present

## 2018-11-20 DIAGNOSIS — G894 Chronic pain syndrome: Secondary | ICD-10-CM | POA: Diagnosis present

## 2018-11-20 DIAGNOSIS — M069 Rheumatoid arthritis, unspecified: Secondary | ICD-10-CM | POA: Diagnosis present

## 2018-11-20 DIAGNOSIS — C3490 Malignant neoplasm of unspecified part of unspecified bronchus or lung: Secondary | ICD-10-CM | POA: Diagnosis present

## 2018-11-20 DIAGNOSIS — Z7951 Long term (current) use of inhaled steroids: Secondary | ICD-10-CM

## 2018-11-20 DIAGNOSIS — Z7189 Other specified counseling: Secondary | ICD-10-CM | POA: Diagnosis not present

## 2018-11-20 DIAGNOSIS — J9610 Chronic respiratory failure, unspecified whether with hypoxia or hypercapnia: Secondary | ICD-10-CM | POA: Diagnosis not present

## 2018-11-20 DIAGNOSIS — G8929 Other chronic pain: Secondary | ICD-10-CM | POA: Diagnosis not present

## 2018-11-20 LAB — CBC
HCT: 24.2 % — ABNORMAL LOW (ref 36.0–46.0)
Hemoglobin: 7.1 g/dL — ABNORMAL LOW (ref 12.0–15.0)
MCH: 29.7 pg (ref 26.0–34.0)
MCHC: 29.3 g/dL — ABNORMAL LOW (ref 30.0–36.0)
MCV: 101.3 fL — ABNORMAL HIGH (ref 80.0–100.0)
Platelets: 447 10*3/uL — ABNORMAL HIGH (ref 150–400)
RBC: 2.39 MIL/uL — ABNORMAL LOW (ref 3.87–5.11)
RDW: 16.7 % — ABNORMAL HIGH (ref 11.5–15.5)
WBC: 10.9 10*3/uL — ABNORMAL HIGH (ref 4.0–10.5)
nRBC: 0.3 % — ABNORMAL HIGH (ref 0.0–0.2)

## 2018-11-20 LAB — COMPREHENSIVE METABOLIC PANEL
ALT: 14 U/L (ref 0–44)
AST: 22 U/L (ref 15–41)
Albumin: 2.1 g/dL — ABNORMAL LOW (ref 3.5–5.0)
Alkaline Phosphatase: 76 U/L (ref 38–126)
Anion gap: 4 — ABNORMAL LOW (ref 5–15)
BUN: 35 mg/dL — ABNORMAL HIGH (ref 8–23)
CO2: 21 mmol/L — AB (ref 22–32)
Calcium: 8.6 mg/dL — ABNORMAL LOW (ref 8.9–10.3)
Chloride: 111 mmol/L (ref 98–111)
Creatinine, Ser: 1.07 mg/dL — ABNORMAL HIGH (ref 0.44–1.00)
GFR calc non Af Amer: 55 mL/min — ABNORMAL LOW (ref >60)
Glucose, Bld: 54 mg/dL — ABNORMAL LOW (ref 70–99)
Potassium: 5.3 mmol/L — ABNORMAL HIGH (ref 3.5–5.1)
Sodium: 136 mmol/L (ref 135–145)
Total Bilirubin: 0.3 mg/dL (ref 0.3–1.2)
Total Protein: 7.4 g/dL (ref 6.5–8.1)

## 2018-11-20 LAB — TROPONIN I: Troponin I: <0.03 ng/mL (ref <0.03)

## 2018-11-20 LAB — GLUCOSE, CAPILLARY
Glucose-Capillary: 72 mg/dL (ref 70–99)
Glucose-Capillary: 119 mg/dL — ABNORMAL HIGH (ref 70–99)

## 2018-11-20 LAB — BRAIN NATRIURETIC PEPTIDE: B Natriuretic Peptide: 65 pg/mL (ref 0.0–100.0)

## 2018-11-20 LAB — PROCALCITONIN: PROCALCITONIN: 0.19 ng/mL

## 2018-11-20 MED ORDER — LEVOFLOXACIN IN D5W 750 MG/150ML IV SOLN
750.0000 mg | INTRAVENOUS | Status: DC
  Administered 2018-11-20 – 2018-11-21 (×2): 750 mg via INTRAVENOUS
  Filled 2018-11-20 (×3): qty 150

## 2018-11-20 MED ORDER — ASPIRIN 81 MG PO CHEW
81.0000 mg | CHEWABLE_TABLET | Freq: Every day | ORAL | Status: DC
  Administered 2018-11-21 – 2018-11-22 (×2): 81 mg via ORAL
  Filled 2018-11-20 (×2): qty 1

## 2018-11-20 MED ORDER — ONDANSETRON HCL 4 MG PO TABS: 4.0000 mg | ORAL_TABLET | Freq: Four times a day (QID) | ORAL | Status: DC | PRN

## 2018-11-20 MED ORDER — AMLODIPINE BESYLATE 5 MG PO TABS
5.0000 mg | ORAL_TABLET | Freq: Every day | ORAL | Status: DC
  Administered 2018-11-21 – 2018-11-22 (×2): 5 mg via ORAL
  Filled 2018-11-20 (×2): qty 1

## 2018-11-20 MED ORDER — HYDROXYCHLOROQUINE SULFATE 200 MG PO TABS
200.0000 mg | ORAL_TABLET | Freq: Two times a day (BID) | ORAL | Status: DC
  Administered 2018-11-20 – 2018-11-22 (×4): 200 mg via ORAL
  Filled 2018-11-20 (×5): qty 1

## 2018-11-20 MED ORDER — ACETAMINOPHEN 325 MG PO TABS
650.0000 mg | ORAL_TABLET | Freq: Four times a day (QID) | ORAL | Status: DC | PRN
  Administered 2018-11-20 – 2018-11-22 (×3): 650 mg via ORAL
  Filled 2018-11-20 (×3): qty 2

## 2018-11-20 MED ORDER — MOMETASONE FURO-FORMOTEROL FUM 200-5 MCG/ACT IN AERO
2.0000 | INHALATION_SPRAY | Freq: Two times a day (BID) | RESPIRATORY_TRACT | Status: DC
  Administered 2018-11-20 – 2018-11-22 (×4): 2 via RESPIRATORY_TRACT
  Filled 2018-11-20: qty 8.8

## 2018-11-20 MED ORDER — FOLIC ACID 1 MG PO TABS
1.0000 mg | ORAL_TABLET | Freq: Every day | ORAL | Status: DC
  Administered 2018-11-21 – 2018-11-22 (×2): 1 mg via ORAL
  Filled 2018-11-20 (×2): qty 1

## 2018-11-20 MED ORDER — SODIUM CHLORIDE 0.9% FLUSH
10.0000 mL | INTRAVENOUS | Status: DC | PRN
  Administered 2018-11-20: 20 mL
  Administered 2018-11-21 – 2018-11-22 (×3): 10 mL
  Filled 2018-11-20 (×3): qty 40

## 2018-11-20 MED ORDER — INSULIN ASPART 100 UNIT/ML ~~LOC~~ SOLN: 0.0000 [IU] | Freq: Every day | SUBCUTANEOUS | Status: DC

## 2018-11-20 MED ORDER — INSULIN GLARGINE 100 UNIT/ML ~~LOC~~ SOLN
38.0000 [IU] | Freq: Every morning | SUBCUTANEOUS | Status: DC
  Filled 2018-11-20: qty 0.38

## 2018-11-20 MED ORDER — ACETAMINOPHEN 650 MG RE SUPP: 650.0000 mg | Freq: Four times a day (QID) | RECTAL | Status: DC | PRN

## 2018-11-20 MED ORDER — SODIUM CHLORIDE 0.9 % IV SOLN
2.0000 g | Freq: Once | INTRAVENOUS | Status: AC
  Administered 2018-11-20: 2 g via INTRAVENOUS
  Filled 2018-11-20: qty 2

## 2018-11-20 MED ORDER — LISINOPRIL 20 MG PO TABS
40.0000 mg | ORAL_TABLET | Freq: Every day | ORAL | Status: DC
  Administered 2018-11-21 – 2018-11-22 (×2): 40 mg via ORAL
  Filled 2018-11-20 (×2): qty 2

## 2018-11-20 MED ORDER — PREDNISONE 10 MG PO TABS
5.0000 mg | ORAL_TABLET | Freq: Every day | ORAL | Status: DC
  Administered 2018-11-21 – 2018-11-22 (×2): 5 mg via ORAL
  Filled 2018-11-20 (×2): qty 1

## 2018-11-20 MED ORDER — NYSTATIN 100000 UNIT/ML MT SUSP
5.0000 mL | Freq: Four times a day (QID) | OROMUCOSAL | Status: DC
  Administered 2018-11-20 – 2018-11-22 (×6): 500000 [IU] via ORAL
  Filled 2018-11-20 (×6): qty 5

## 2018-11-20 MED ORDER — ALBUTEROL SULFATE (2.5 MG/3ML) 0.083% IN NEBU
3.0000 mL | INHALATION_SOLUTION | Freq: Four times a day (QID) | RESPIRATORY_TRACT | Status: DC
  Administered 2018-11-21 – 2018-11-22 (×7): 3 mL via RESPIRATORY_TRACT
  Filled 2018-11-20 (×7): qty 3

## 2018-11-20 MED ORDER — ONDANSETRON HCL 4 MG/2ML IJ SOLN: 4.0000 mg | Freq: Four times a day (QID) | INTRAMUSCULAR | Status: DC | PRN

## 2018-11-20 MED ORDER — FUROSEMIDE 20 MG PO TABS
20.0000 mg | ORAL_TABLET | Freq: Every day | ORAL | Status: DC
  Administered 2018-11-21 – 2018-11-22 (×2): 20 mg via ORAL
  Filled 2018-11-20 (×2): qty 1

## 2018-11-20 MED ORDER — FERROUS SULFATE 325 (65 FE) MG PO TABS
325.0000 mg | ORAL_TABLET | Freq: Two times a day (BID) | ORAL | Status: DC
  Administered 2018-11-21 – 2018-11-22 (×3): 325 mg via ORAL
  Filled 2018-11-20 (×3): qty 1

## 2018-11-20 MED ORDER — PANTOPRAZOLE SODIUM 40 MG PO TBEC
40.0000 mg | DELAYED_RELEASE_TABLET | Freq: Every day | ORAL | Status: DC
  Administered 2018-11-21 – 2018-11-22 (×2): 40 mg via ORAL
  Filled 2018-11-20 (×2): qty 1

## 2018-11-20 MED ORDER — ADULT MULTIVITAMIN W/MINERALS CH
1.0000 | ORAL_TABLET | Freq: Every day | ORAL | Status: DC
  Administered 2018-11-21 – 2018-11-22 (×2): 1 via ORAL
  Filled 2018-11-20 (×3): qty 1

## 2018-11-20 MED ORDER — MIRTAZAPINE 15 MG PO TABS
7.5000 mg | ORAL_TABLET | Freq: Every day | ORAL | Status: DC
  Administered 2018-11-20 – 2018-11-21 (×2): 7.5 mg via ORAL
  Filled 2018-11-20 (×2): qty 1

## 2018-11-20 MED ORDER — OXYCODONE HCL 5 MG PO TABS
10.0000 mg | ORAL_TABLET | Freq: Four times a day (QID) | ORAL | Status: DC | PRN
  Administered 2018-11-20 – 2018-11-22 (×5): 10 mg via ORAL
  Filled 2018-11-20 (×5): qty 2

## 2018-11-20 MED ORDER — ENOXAPARIN SODIUM 40 MG/0.4ML ~~LOC~~ SOLN
40.0000 mg | SUBCUTANEOUS | Status: DC
  Administered 2018-11-20: 23:00:00 40 mg via SUBCUTANEOUS
  Filled 2018-11-20: qty 0.4

## 2018-11-20 MED ORDER — ATORVASTATIN CALCIUM 20 MG PO TABS
40.0000 mg | ORAL_TABLET | Freq: Every day | ORAL | Status: DC
  Administered 2018-11-20 – 2018-11-21 (×2): 40 mg via ORAL
  Filled 2018-11-20 (×2): qty 2

## 2018-11-20 MED ORDER — PROCHLORPERAZINE MALEATE 10 MG PO TABS
10.0000 mg | ORAL_TABLET | Freq: Four times a day (QID) | ORAL | Status: DC | PRN
  Filled 2018-11-20: qty 1

## 2018-11-20 MED ORDER — SUCRALFATE 1 G PO TABS
1.0000 g | ORAL_TABLET | Freq: Three times a day (TID) | ORAL | Status: DC
  Administered 2018-11-20 – 2018-11-22 (×7): 1 g via ORAL
  Filled 2018-11-20 (×7): qty 1

## 2018-11-20 MED ORDER — METOPROLOL TARTRATE 25 MG PO TABS
25.0000 mg | ORAL_TABLET | Freq: Two times a day (BID) | ORAL | Status: DC
  Administered 2018-11-20 – 2018-11-22 (×4): 25 mg via ORAL
  Filled 2018-11-20 (×4): qty 1

## 2018-11-20 MED ORDER — ONDANSETRON HCL 4 MG PO TABS: 8.0000 mg | ORAL_TABLET | Freq: Two times a day (BID) | ORAL | Status: DC | PRN

## 2018-11-20 MED ORDER — VANCOMYCIN HCL IN DEXTROSE 1-5 GM/200ML-% IV SOLN
1000.0000 mg | Freq: Once | INTRAVENOUS | Status: AC
  Administered 2018-11-20: 1000 mg via INTRAVENOUS

## 2018-11-20 MED ORDER — INSULIN ASPART 100 UNIT/ML ~~LOC~~ SOLN
0.0000 [IU] | Freq: Three times a day (TID) | SUBCUTANEOUS | Status: DC
  Administered 2018-11-21: 2 [IU] via SUBCUTANEOUS
  Administered 2018-11-21 (×2): 1 [IU] via SUBCUTANEOUS
  Administered 2018-11-22: 2 [IU] via SUBCUTANEOUS
  Filled 2018-11-20 (×4): qty 1

## 2018-11-20 NOTE — ED Provider Notes (Signed)
[facility] Emergency Department Provider Note  Time seen: 4:55 PM  I have reviewed the triage vital signs and the nursing notes.   HISTORY  Chief Complaint Shortness of Breath   HPI Tamara Parrish is a 63 y.o. female with a past medical history of lung cancer currently on chemotherapy, diabetes, hypertension, hyperlipidemia, presents to the emergency department for 3 days of worsening shortness of breath, lower extremity edema.  According to the patient over the past 3 days she has had worsening shortness of breath, and for 1 week he has had pedal edema.  Patient denies any history of pedal edema in the past.  Patient denies any cough over baseline.  Denies any fever.  No high risk travel.   Past Medical History:  Diagnosis Date  . Cancer ([place])    lung ca DX 2019  . Collagen vascular disease (HCC)    RA  . DM2 (diabetes mellitus, type 2) ([place])   . Dyspnea   . Hyperlipemia   . Hypertension   . Osteomyelitis ([place])   . Pneumonia   . RA (rheumatoid arthritis) ([place])   . Rheumatoid arthritis [facility])     Patient Active Problem List   Diagnosis Date Noted  . Dehydration 11/02/2018  . Anemia 10/26/2018  . Encounter for antineoplastic chemotherapy 07/12/2018  . Rheumatoid arthritis ([place]) 07/12/2018  . Thrombocytosis ([place]) 07/12/2018  . Goals of care, counseling/discussion 05/25/2018  . Malignant neoplasm of lung ([place]) 05/25/2018  . Recurrent pneumonia 02/21/2018  . PNA (pneumonia) 02/07/2018  . Elevated erythrocyte sedimentation rate 12/16/2017  . Dry cough 08/19/2017  . Rheumatoid nodule of multiple sites ([place]) 08/19/2017  . Polyneuropathy associated with underlying disease ([place]) 05/17/2017  . Seasonal allergic rhinitis due to pollen 05/17/2017  . Essential hypertension 10/09/2016  . Pure hypercholesterolemia 10/09/2016  . Rheumatoid arthritis, seropositive ([place]) 03/27/2016  . Polyarthralgia 03/18/2016  . Diabetes mellitus type 2, uncomplicated  ([place]) 16/96/7893  . Allergic rhinitis 05/18/2014  . Microalbuminuria 04/18/2014    Past Surgical History:  Procedure Laterality Date  . ENDOBRONCHIAL ULTRASOUND N/A 05/13/2018   Procedure: ENDOBRONCHIAL ULTRASOUND;  Surgeon: [name], MD;  Location: [facility] ORS;  Service: Pulmonary;  Laterality: N/A;  . PORTA CATH INSERTION N/A 05/27/2018   Procedure: PORTA CATH INSERTION;  Surgeon: Katha Cabal, MD;  Location: [place] CV LAB;  Service: Cardiovascular;  Laterality: N/A;  . TOE SURGERY      Prior to Admission medications   Medication Sig Start Date End Date Taking? Authorizing Provider  albuterol (PROVENTIL HFA;VENTOLIN HFA) 108 (90 Base) MCG/ACT inhaler Inhale 2 puffs into the lungs every 6 (six) hours as needed for wheezing or shortness of breath. 08/23/18   Earlie Server, MD  amLODipine (NORVASC) 5 MG tablet Take 5 mg by mouth daily. 01/20/18   [provider]  aspirin 81 MG chewable tablet Chew 1 tablet by mouth daily.    [provider]  atorvastatin (LIPITOR) 40 MG tablet Take 40 mg by mouth daily. 01/20/18   [provider]  budesonide-formoterol (SYMBICORT) 160-4.5 MCG/ACT inhaler Inhale 2 puffs into the lungs 2 (two) times daily. 10/12/18   [name], MD  dexamethasone (DECADRON) 4 MG tablet Take 2 tablets (8 mg total) by mouth daily. Start the day after chemotherapy for 2 days. 05/25/18   Earlie Server, MD  ferrous sulfate 325 (65 FE) MG EC tablet Take 1 tablet (325 mg total) by mouth 2 (two) times daily with a meal. 10/12/18   Tasia Catchings,  Talbert Cage, MD  folic acid (FOLVITE) 1 MG tablet Take 1 mg by mouth daily. 01/20/18   [provider]  hydroxychloroquine (PLAQUENIL) 200 MG tablet 1 tab twice a day, 90 days 05/31/18   [provider]  Insulin Glargine (BASAGLAR KWIKPEN) 100 UNIT/ML SOPN Inject 38 Units into the skin every morning.  01/04/18   [provider]  lidocaine-prilocaine (EMLA) cream Apply to affected area once  08/23/18   Earlie Server, MD  lisinopril (PRINIVIL,ZESTRIL) 40 MG tablet Take 40 mg by mouth daily. 01/20/18   [provider]  metFORMIN (GLUCOPHAGE) 1000 MG tablet Take 1,000 mg by mouth 2 (two) times daily. 01/20/18   [provider]  metoprolol tartrate (LOPRESSOR) 25 MG tablet Take 25 mg by mouth 2 (two) times daily. 01/20/18   [provider]  mirtazapine (REMERON) 7.5 MG tablet Take 1 tablet (7.5 mg total) by mouth at bedtime. 08/23/18   Earlie Server, MD  Multiple Vitamin (MULTIVITAMIN WITH MINERALS) TABS tablet Take 1 tablet by mouth daily for 30 days. 10/29/18 11/28/18  Lule, Sara Chu, PA  NARCAN 4 MG/0.1ML LIQD nasal spray kit CALL 911. ADMINISTER A SINGLE SPRAY OF NARCAN IN ONE NOSTRIL. REPEAT EVERY 3 MINUTES AS NEEDED IF NO OR MINIMAL RESPONSE. 08/03/18   [provider]  nystatin (MYCOSTATIN) 100000 UNIT/ML suspension Take 5 mLs (500,000 Units total) by mouth 4 (four) times daily. 11/02/18   Earlie Server, MD  omeprazole (PRILOSEC) 20 MG capsule Take 1 capsule (20 mg total) by mouth daily. 07/12/18   Earlie Server, MD  ondansetron (ZOFRAN) 8 MG tablet Take 1 tablet (8 mg total) by mouth 2 (two) times daily as needed for refractory nausea / vomiting. Start on day 3 after chemo. 08/23/18   Earlie Server, MD  Oxycodone HCl 10 MG TABS Take 1 tablet (10 mg total) by mouth every 6 (six) hours as needed. 11/18/18   Earlie Server, MD  predniSONE (DELTASONE) 5 MG tablet Take 1 tablet (5 mg total) by mouth daily with breakfast. 11/07/18   Earlie Server, MD  prochlorperazine (COMPAZINE) 10 MG tablet Take 1 tablet (10 mg total) by mouth every 6 (six) hours as needed (Nausea or vomiting). 05/25/18   Earlie Server, MD  sucralfate (CARAFATE) 1 g tablet Take 1 tablet (1 g total) by mouth 3 (three) times daily for 5 days. Dissolve in 3-4 tbsp warm water, swish and swallow 10/28/18 11/02/18  Lule, Sara Chu, PA  triamterene-hydrochlorothiazide (DYAZIDE) 37.5-25 MG capsule Take 1 capsule by mouth daily. 01/20/18   [provider]    Allergies  Allergen Reactions  . Ibuprofen Other (See Comments)    Is not supposed to take because of her kidneys.  . Penicillins Other (See Comments)    Has patient had a PCN reaction causing immediate rash, facial/tongue/throat swelling, SOB or lightheadedness with hypotension: Unknown Has patient had a PCN reaction causing severe rash involving mucus membranes or skin necrosis: Unknown Has patient had a PCN reaction that required hospitalization: Unknown Has patient had a PCN reaction occurring within the last 10 years: Unknown If all of the above answers are "NO", then may proceed with Cephalosporin use.    Family History  Problem Relation Age of Onset  . Diabetes Mother   . Lung cancer Father     Social History Social History   Tobacco Use  . Smoking status: Former Smoker    Packs/day: 1.00    Years: 40.00    Pack years: 40.00    Types: Cigarettes  Last attempt to quit: 11/22/2017    Years since quitting: 0.9  . Smokeless tobacco: Never Used  . Tobacco comment: quit 2 weeks ago  Substance Use Topics  . Alcohol use: Never    Frequency: Never  . Drug use: Never    Review of Systems Constitutional: Negative for fever Cardiovascular: Negative for chest pain. Respiratory: Positive for shortness of breath worsening over the past 3 days.   Gastrointestinal: Negative for abdominal pain, vomiting  Genitourinary: Negative for urinary compaints Musculoskeletal: Negative for musculoskeletal complaints Neurological: Negative for headache All other ROS negative  ____________________________________________   PHYSICAL EXAM:  VITAL SIGNS: ED Triage Vitals  Enc Vitals Group     BP 11/20/18 1653 118/77     Pulse Rate 11/20/18 1653 82     Resp 11/20/18 1653 (!) 24     Temp 11/20/18 1653 97.7 F (36.5 C)     Temp Source 11/20/18 1653 Oral     SpO2 11/20/18 1649 98 %     Weight --      Height --      Head Circumference --      Peak Flow --       Pain Score 11/20/18 1655 0     Pain Loc --      Pain Edu? --      Excl. in [place]? --    Constitutional: Alert and oriented. Well appearing and in no distress. Eyes: Normal exam ENT   Head: Normocephalic and atraumatic.   Mouth/Throat: Mucous membranes are moist. Cardiovascular: Normal rate, regular rhythm.  Respiratory: Normal respiratory effort without tachypnea nor retractions. Breath sounds are clear, no obvious wheeze rales or rhonchi. Gastrointestinal: Soft and nontender. No distention.   Musculoskeletal: Nontender, moderate pedal/lower extremity edema bilaterally, pitting up to the mid tibia. Neurologic:  Normal speech and language. No gross focal neurologic deficits Skin:  Skin is warm, dry and intact.  Psychiatric: Mood and affect are normal.   ____________________________________________    EKG  EKG viewed and interpreted by myself shows a normal sinus rhythm 82 bpm with a narrow QRS, normal axis, normal intervals, no concerning ST changes.  ____________________________________________    RADIOLOGY  Chest x-ray shows worsening infiltrate in the left lower lobe.  ____________________________________________   INITIAL IMPRESSION / ASSESSMENT AND PLAN / ED COURSE  Pertinent labs & imaging results that were available during my care of the patient were reviewed by me and considered in my medical decision making (see chart for details).  Patient presents to the emergency department for 3 days of worsening shortness of breath as well as new onset pedal edema over the past 1 week.  Differential this time would include CHF, pulmonary edema, pneumonia, pneumothorax, ACS.  We will check labs including troponin as well as a BNP, chest x-ray, EKG and continue to closely monitor.  Patient agreeable to plan of care.  Chest x-ray shows worsening infiltrate in the left lower lobe.  Given the patient's increased shortness of breath, now requiring 3 L of oxygen versus baseline of 2  L of oxygen patient will require admission to the hospital.  We will start on antibiotics to cover for healthcare associated pneumonia.  Labs are pending.  Patient care signed out to Dr. Corky Downs. ____________________________________________   FINAL CLINICAL IMPRESSION(S) / ED DIAGNOSES  Dyspnea   Harvest Dark, MD 11/20/18 [phone]

## 2018-11-20 NOTE — ED Notes (Signed)
Antibiotics started late due to inability to access Pt's port. Pt refused IV attempt by ED staff. IV team was able to access port. Antibiotics also delayed due to need for blood cultures prior to starting. Lab called to collect due to difficult stick. Once cultures obtained antibiotics started.

## 2018-11-20 NOTE — Consult Note (Signed)
Pharmacy Antibiotic Note  Tamara Parrish is a 63 y.o. female admitted on 11/20/2018 with pneumonia.  Pharmacy has been consulted for Levaquin dosing.  Plan:  Will dose Levaquin IV 750mg  qd and monitor Scr for potential dosing adjustments   Temp (24hrs), Avg:97.7 F (36.5 C), Min:97.7 F (36.5 C), Max:97.7 F (36.5 C)  Recent Labs  Lab 11/20/18 1948  WBC 10.9*  CREATININE 1.07*    Estimated Creatinine Clearance: 58.4 mL/min (A) (by C-G formula based on SCr of 1.07 mg/dL (H)).    Allergies  Allergen Reactions  . Ibuprofen Other (See Comments)    Is not supposed to take because of her kidneys.  . Penicillins Other (See Comments)    Has patient had a PCN reaction causing immediate rash, facial/tongue/throat swelling, SOB or lightheadedness with hypotension: Unknown Has patient had a PCN reaction causing severe rash involving mucus membranes or skin necrosis: Unknown Has patient had a PCN reaction that required hospitalization: Unknown Has patient had a PCN reaction occurring within the last 10 years: Unknown If all of the above answers are "NO", then may proceed with Cephalosporin use.    Antimicrobials this admission: 3/29 Aztreonam x 1, Vancomycin x 1  3/29 Levaquin >>   Dose adjustments this admission: None  Microbiology results: 3/29 BCx: pending  Thank you for allowing pharmacy to be a part of this patient's care.  Lu Duffel, PharmD, BCPS Clinical Pharmacist 11/20/2018 9:38 PM

## 2018-11-20 NOTE — ED Notes (Addendum)
IV team unsuccessful accessed port; IV team states they will be making the oncoming nurse aware of inability to access port and they will come to evaluate

## 2018-11-20 NOTE — ED Notes (Signed)
Tamara Parrish- pts daughter- [phone].

## 2018-11-20 NOTE — ED Notes (Signed)
ED TO INPATIENT HANDOFF REPORT  ED Nurse Name and Phone #:  Maudie Mercury [phone]  S Name/Age/Gender Tamara Parrish 63 y.o. female Room/Bed: ED09A/ED09A  Code Status   Code Status: Full Code  Home/SNF/Other Home Patient oriented to: self, place, time and situation Is this baseline? Yes   Triage Complete: Triage complete  Chief Complaint generaized weakness  Triage Note Pt to ED by EMS with c/o of SOB and bilateral LE edema. Pt is lung cancer pt and has only left house for treatment at cancer center. No potential expose to [place].   Allergies Allergies  Allergen Reactions  . Ibuprofen Other (See Comments)    Is not supposed to take because of her kidneys.  . Penicillins Other (See Comments)    Has patient had a PCN reaction causing immediate rash, facial/tongue/throat swelling, SOB or lightheadedness with hypotension: Unknown Has patient had a PCN reaction causing severe rash involving mucus membranes or skin necrosis: Unknown Has patient had a PCN reaction that required hospitalization: Unknown Has patient had a PCN reaction occurring within the last 10 years: Unknown If all of the above answers are "NO", then may proceed with Cephalosporin use.    Level of Care/Admitting Diagnosis ED Disposition    ED Disposition Condition [place] [facility] Area: [place] [[id number]]  Level of Care: Med-Surg [16]  Diagnosis: Pneumonia [[id number]]  Admitting Physician: Loletha Grayer [[id number]]  Attending Physician: Loletha Grayer [[id number]]  Estimated length of stay: past midnight tomorrow  Certification:: I certify this patient will need inpatient services for at least 2 midnights  PT Class (Do Not Modify): Inpatient [101]  PT Acc Code (Do Not Modify): Private [1]       B Medical/Surgery History Past Medical History:  Diagnosis Date  . Cancer ([place])    lung ca DX 2019  . Collagen vascular disease (HCC)    RA  . DM2 (diabetes mellitus, type 2) ([place])    . Dyspnea   . Hyperlipemia   . Hypertension   . Osteomyelitis ([place])   . Pneumonia   . RA (rheumatoid arthritis) ([place])   . Rheumatoid arthritis [facility])    Past Surgical History:  Procedure Laterality Date  . ENDOBRONCHIAL ULTRASOUND N/A 05/13/2018   Procedure: ENDOBRONCHIAL ULTRASOUND;  Surgeon: [name], MD;  Location: [facility] ORS;  Service: Pulmonary;  Laterality: N/A;  . PORTA CATH INSERTION N/A 05/27/2018   Procedure: PORTA CATH INSERTION;  Surgeon: Katha Cabal, MD;  Location: [place] CV LAB;  Service: Cardiovascular;  Laterality: N/A;  . TOE SURGERY       A IV Location/Drains/Wounds Patient Lines/Drains/Airways Status   Active Line/Drains/Airways    Name:   Placement date:   Placement time:   Site:   Days:   Implanted Port 05/27/18 Right Chest   05/27/18    1431    Chest   177   External Urinary Catheter   10/27/18    1622    -   24          Intake/Output Last 24 hours No intake or output data in the 24 hours ending 11/20/18 2105  Labs/Imaging Results for orders placed or performed during the hospital encounter of 11/20/18 (from the past 48 hour(s))  CBC     Status: Abnormal   Collection Time: 11/20/18  7:48 PM  Result Value Ref Range   WBC 10.9 (H) 4.0 - 10.5 K/uL   RBC 2.39 (L) 3.87 - 5.11 MIL/uL   Hemoglobin  7.1 (L) 12.0 - 15.0 g/dL   HCT 24.2 (L) 36.0 - 46.0 %   MCV 101.3 (H) 80.0 - 100.0 fL   MCH 29.7 26.0 - 34.0 pg   MCHC 29.3 (L) 30.0 - 36.0 g/dL   RDW 16.7 (H) 11.5 - 15.5 %   Platelets 447 (H) 150 - 400 K/uL   nRBC 0.3 (H) 0.0 - 0.2 %    Comment: Performed at [facility], [address] [place], [place] [address]  Comprehensive metabolic panel     Status: Abnormal   Collection Time: 11/20/18  7:48 PM  Result Value Ref Range   Sodium 136 135 - 145 mmol/L   Potassium 5.3 (H) 3.5 - 5.1 mmol/L   Chloride 111 98 - 111 mmol/L   CO2 21 (L) 22 - 32 mmol/L   Glucose, Bld 54 (L) 70 - 99 mg/dL   BUN 35 (H) 8 - 23 mg/dL    Creatinine, Ser 1.07 (H) 0.44 - 1.00 mg/dL   Calcium 8.6 (L) 8.9 - 10.3 mg/dL   Total Protein 7.4 6.5 - 8.1 g/dL   Albumin 2.1 (L) 3.5 - 5.0 g/dL   AST 22 15 - 41 U/L   ALT 14 0 - 44 U/L   Alkaline Phosphatase 76 38 - 126 U/L   Total Bilirubin 0.3 0.3 - 1.2 mg/dL   GFR calc non Af Amer 55 (L) >60 mL/min   GFR calc Af Amer >60 >60 mL/min   Anion gap 4 (L) 5 - 15    Comment: Performed at [facility], [address] [place], [place] [address]  Brain natriuretic peptide     Status: None   Collection Time: 11/20/18  7:48 PM  Result Value Ref Range   B Natriuretic Peptide 65.0 0.0 - 100.0 pg/mL    Comment: Performed at [facility], [address] [place], [place] [address]  Troponin I - ONCE - STAT     Status: None   Collection Time: 11/20/18  7:48 PM  Result Value Ref Range   Troponin I <0.03 <0.03 ng/mL    Comment: Performed at [facility], [address] [place], [place] [address]   Dg Chest 2 View  Result Date: 11/20/2018 CLINICAL DATA:  Shortness of breath.  Left lower extremity edema. EXAM: CHEST - 2 VIEW COMPARISON:  November 07, 2018 chest x-ray and chest CT FINDINGS: A right Port-A-Cath is stable. Infiltrate in the left lung has worsened in the interval. No other interval changes. IMPRESSION: Worsening infiltrate in the left lung suggesting the possibility of worsening pneumonia. Electronically Signed   By: Dorise Bullion III M.D   On: 11/20/2018 17:32    Pending Labs Unresulted Labs (From admission, onward)    Start     Ordered   11/27/18 0500  Creatinine, serum  (enoxaparin (LOVENOX)    CrCl >/= 30 ml/min)  Weekly,   STAT    Comments:  while on enoxaparin therapy    11/20/18 2045   11/21/18 2025  Basic metabolic panel  Tomorrow morning,   STAT     11/20/18 2045   11/21/18 0500  CBC  Tomorrow morning,   STAT     11/20/18 2045   11/20/18 1752  Blood culture (routine x 2)  BLOOD CULTURE X 2,   STAT     11/20/18 1752           Vitals/Pain Today's Vitals   11/20/18 1649 11/20/18 1653 11/20/18 1655 11/20/18 1700  BP:  118/77  124/62  Pulse:  82  86  Resp:  (!) 24  (!) 34  Temp:  97.7 F (36.5 C)    TempSrc:  Oral    SpO2: 98% 99%  96%  PainSc:   0-No pain     Isolation Precautions No active isolations  Medications Medications  vancomycin (VANCOCIN) IVPB 1000 mg/200 mL premix (has no administration in time range)  aztreonam (AZACTAM) 2 g in sodium chloride 0.9 % 100 mL IVPB (2 g Intravenous New Bag/Given 11/20/18 2048)  sodium chloride flush (NS) 0.9 % injection 10-40 mL (20 mLs Intracatheter Given 11/20/18 1955)  levofloxacin (LEVAQUIN) IVPB 750 mg (has no administration in time range)  enoxaparin (LOVENOX) injection 40 mg (has no administration in time range)  acetaminophen (TYLENOL) tablet 650 mg (has no administration in time range)    Or  acetaminophen (TYLENOL) suppository 650 mg (has no administration in time range)  ondansetron (ZOFRAN) tablet 4 mg (has no administration in time range)    Or  ondansetron (ZOFRAN) injection 4 mg (has no administration in time range)  aspirin chewable tablet 81 mg (has no administration in time range)  oxyCODONE (Oxy IR/ROXICODONE) immediate release tablet 10 mg (has no administration in time range)  hydroxychloroquine (PLAQUENIL) tablet 200 mg (has no administration in time range)  amLODipine (NORVASC) tablet 5 mg (has no administration in time range)  atorvastatin (LIPITOR) tablet 40 mg (has no administration in time range)  lisinopril (PRINIVIL,ZESTRIL) tablet 40 mg (has no administration in time range)  metoprolol tartrate (LOPRESSOR) tablet 25 mg (has no administration in time range)  mirtazapine (REMERON) tablet 7.5 mg (has no administration in time range)  prochlorperazine (COMPAZINE) tablet 10 mg (has no administration in time range)  Basaglar KwikPen KwikPen 38 Units (has no administration in time range)  predniSONE (DELTASONE) tablet 5 mg (has no  administration in time range)  pantoprazole (PROTONIX) EC tablet 40 mg (has no administration in time range)  sucralfate (CARAFATE) tablet 1 g (has no administration in time range)  ferrous sulfate EC tablet 325 mg (has no administration in time range)  folic acid (FOLVITE) tablet 1 mg (has no administration in time range)  multivitamin with minerals tablet 1 tablet (has no administration in time range)  albuterol (PROVENTIL) (2.5 MG/3ML) 0.083% nebulizer solution 3 mL (has no administration in time range)  mometasone-formoterol (DULERA) 200-5 MCG/ACT inhaler 2 puff (has no administration in time range)  nystatin (MYCOSTATIN) 100000 UNIT/ML suspension 500,000 Units (has no administration in time range)  furosemide (LASIX) tablet 20 mg (has no administration in time range)  insulin aspart (novoLOG) injection 0-9 Units (has no administration in time range)  insulin aspart (novoLOG) injection 0-5 Units (has no administration in time range)    Mobility walks Low fall risk   Focused Assessments Pulmonary Assessment Handoff:  Lung sounds: Bilateral Breath Sounds: Diminished O2 Device: Nasal Cannula O2 Flow Rate (L/min): 3 L/min      R Recommendations: See Admitting Provider Note  Report given to:   Additional Notes:

## 2018-11-20 NOTE — H&P (Signed)
[facility] [place] NAME: Tamara Parrish    MR#:  [id number]  DATE OF BIRTH:  07-11-1956  DATE OF ADMISSION:  11/20/2018  PRIMARY CARE PHYSICIAN: Glendon Axe, MD   REQUESTING/REFERRING PHYSICIAN: Dr Kerman Passey  CHIEF COMPLAINT:   Chief Complaint  Patient presents with  . Shortness of Breath    HISTORY OF PRESENT ILLNESS:  Tamara Parrish  is a 63 y.o. female with a known history of lung cancer presents with shortness of breath.  She states her shortness of breath has been going on for a while and getting worse.  She states that she has had nausea and her feet are swelling.  She has had some body aches.  She has been short winded and has a dry cough for the last 2 to 3 weeks.  She ran out of her inhaler.  Patient was found to have pneumonia on chest x-ray.  Looking back at CT scan from 11/07/2018 she had pneumonia at that time.   PAST MEDICAL HISTORY:   Past Medical History:  Diagnosis Date  . Cancer ([place])    lung ca DX 2019  . Collagen vascular disease (HCC)    RA  . DM2 (diabetes mellitus, type 2) ([place])   . Dyspnea   . Hyperlipemia   . Hypertension   . Osteomyelitis ([place])   . Pneumonia   . RA (rheumatoid arthritis) ([place])   . Rheumatoid arthritis ([place])     PAST SURGICAL HISTORY:   Past Surgical History:  Procedure Laterality Date  . ENDOBRONCHIAL ULTRASOUND N/A 05/13/2018   Procedure: ENDOBRONCHIAL ULTRASOUND;  Surgeon: [name], MD;  Location: [facility] OR[facility];  Service: Pulmonary;  Laterality: N/A;  . PORTA CATH INSERTION N/A 05/27/2018   Procedure: PORTA CATH INSERTION;  Surgeon: Katha Cabal, MD;  Location: [facility];  Service: Cardiovascular;  Laterality: N/A;  . TOE SURGERY      SOCIAL HISTORY:   Social History   Tobacco Use  . Smoking status: Former Smoker    Packs/day: 1.00    Years: 40.00    Pack years: 40.00    Types: Cigarettes    Last attempt to quit: 11/22/2017    Years  since quitting: 0.9  . Smokeless tobacco: Never Used  . Tobacco comment: quit 2 weeks ago  Substance Use Topics  . Alcohol use: Never    Frequency: Never    FAMILY HISTORY:   Family History  Problem Relation Age of Onset  . Diabetes Mother   . Lung cancer Father     DRUG ALLERGIES:   Allergies  Allergen Reactions  . Ibuprofen Other (See Comments)    Is not supposed to take because of her kidneys.  . Penicillins Other (See Comments)    Has patient had a PCN reaction causing immediate rash, facial/tongue/throat swelling, SOB or lightheadedness with hypotension: Unknown Has patient had a PCN reaction causing severe rash involving mucus membranes or skin necrosis: Unknown Has patient had a PCN reaction that required hospitalization: Unknown Has patient had a PCN reaction occurring within the last 10 years: Unknown If all of the above answers are "NO", then may proceed with Cephalosporin use.    REVIEW OF SYSTEMS:  CONSTITUTIONAL: No fever, positive for chills and sweats.  Positive for fatigue.  EYES: No blurred or double vision.  EARS, NOSE, AND THROAT: No tinnitus or ear pain. No sore throat.  Positive runny nose RESPIRATORY: No cough, shortness of breath, wheezing or hemoptysis.  CARDIOVASCULAR: No chest pain, orthopnea, edema.  GASTROINTESTINAL: Positive for nausea.  No vomiting, diarrhea or abdominal pain. No blood in bowel movements.  Some constipation GENITOURINARY: No dysuria, hematuria.  ENDOCRINE: No polyuria, nocturia,  HEMATOLOGY: No anemia, easy bruising or bleeding SKIN: No rash or lesion. MUSCULOSKELETAL: No joint pain or arthritis.   NEUROLOGIC: No tingling, numbness, weakness.  PSYCHIATRY: Some depression.   MEDICATIONS AT HOME:   Prior to Admission medications   Medication Sig Start Date End Date Taking? Authorizing Provider  ALPRAZolam Duanne Moron) 0.25 MG tablet Take 0.25 mg by mouth at bedtime as needed for anxiety.   Yes [provider]   amLODipine (NORVASC) 5 MG tablet Take 5 mg by mouth daily. 01/20/18  Yes [provider]  atorvastatin (LIPITOR) 40 MG tablet Take 40 mg by mouth daily. 01/20/18  Yes [provider]  budesonide-formoterol (SYMBICORT) 160-4.5 MCG/ACT inhaler Inhale 2 puffs into the lungs 2 (two) times daily. 10/12/18  Yes [name], MD  dexamethasone (DECADRON) 4 MG tablet Take 2 tablets (8 mg total) by mouth daily. Start the day after chemotherapy for 2 days. 05/25/18  Yes Earlie Server, MD  ferrous sulfate 325 (65 FE) MG EC tablet Take 1 tablet (325 mg total) by mouth 2 (two) times daily with a meal. 10/12/18  Yes Earlie Server, MD  folic acid (FOLVITE) 1 MG tablet Take 1 mg by mouth daily. 01/20/18  Yes [provider]  hydroxychloroquine (PLAQUENIL) 200 MG tablet 1 tab twice a day, 90 days 05/31/18  Yes [provider]  Insulin Glargine (BASAGLAR KWIKPEN) 100 UNIT/ML SOPN Inject 38 Units into the skin every morning.  01/04/18  Yes [provider]  lisinopril (PRINIVIL,ZESTRIL) 40 MG tablet Take 40 mg by mouth daily. 01/20/18  Yes [provider]  metFORMIN (GLUCOPHAGE) 1000 MG tablet Take 1,000 mg by mouth 2 (two) times daily. 01/20/18  Yes [provider]  metoprolol tartrate (LOPRESSOR) 25 MG tablet Take 25 mg by mouth 2 (two) times daily. 01/20/18  Yes [provider]  mirtazapine (REMERON) 7.5 MG tablet Take 1 tablet (7.5 mg total) by mouth at bedtime. 08/23/18  Yes Earlie Server, MD  omeprazole (PRILOSEC) 20 MG capsule Take 1 capsule (20 mg total) by mouth daily. 07/12/18  Yes Earlie Server, MD  Oxycodone HCl 10 MG TABS Take 1 tablet (10 mg total) by mouth every 6 (six) hours as needed. 11/18/18  Yes Earlie Server, MD  triamterene-hydrochlorothiazide (DYAZIDE) 37.5-25 MG capsule Take 1 capsule by mouth daily. 01/20/18  Yes [provider]  albuterol (PROVENTIL HFA;VENTOLIN HFA) 108 (90 Base) MCG/ACT inhaler Inhale 2 puffs into the lungs every 6 (six)  hours as needed for wheezing or shortness of breath. Patient not taking: Reported on 11/20/2018 08/23/18   Earlie Server, MD  aspirin 81 MG chewable tablet Chew 1 tablet by mouth daily.    [provider]  lidocaine-prilocaine (EMLA) cream Apply to affected area once Patient not taking: Reported on 11/20/2018 08/23/18   Earlie Server, MD  Multiple Vitamin (MULTIVITAMIN WITH MINERALS) TABS tablet Take 1 tablet by mouth daily for 30 days. 10/29/18 11/28/18  Lule, Sara Chu, PA  NARCAN 4 MG/0.1ML LIQD nasal spray kit CALL 911. ADMINISTER A SINGLE SPRAY OF NARCAN IN ONE NOSTRIL. REPEAT EVERY 3 MINUTES AS NEEDED IF NO OR MINIMAL RESPONSE. 08/03/18   [provider]  nystatin (MYCOSTATIN) 100000 UNIT/ML suspension Take 5 mLs (500,000 Units total) by mouth 4 (four) times daily. 11/02/18   Earlie Server, MD  ondansetron (ZOFRAN) 8 MG tablet Take 1 tablet (8 mg total) by mouth 2 (two) times daily as needed for refractory nausea / vomiting. Start on day 3 after chemo. Patient not taking: Reported on 11/20/2018 08/23/18   Earlie Server, MD  predniSONE (DELTASONE) 5 MG tablet Take 1 tablet (5 mg total) by mouth daily with breakfast. 11/07/18   Earlie Server, MD  prochlorperazine (COMPAZINE) 10 MG tablet Take 1 tablet (10 mg total) by mouth every 6 (six) hours as needed (Nausea or vomiting). 05/25/18   Earlie Server, MD  sucralfate (CARAFATE) 1 g tablet Take 1 tablet (1 g total) by mouth 3 (three) times daily for 5 days. Dissolve in 3-4 tbsp warm water, swish and swallow 10/28/18 11/02/18  Lule, Sara Chu, PA      VITAL SIGNS:  Blood pressure 124/62, pulse 86, temperature 97.7 F (36.5 C), temperature source Oral, resp. rate (!) 34, SpO2 96 %.  PHYSICAL EXAMINATION:  GENERAL:  63 y.o.-year-old patient lying in the bed with slight respiratory acute distress.  EYES: Pupils equal, round, reactive to light and accommodation. No scleral icterus. Extraocular muscles intact.  HEENT: Head atraumatic, normocephalic. Oropharynx and nasopharynx  clear.  NECK:  Supple, no jugular venous distention. No thyroid enlargement, no tenderness.  LUNGS: Decreased breath sounds bilaterally, no wheezing.positive rhonchi in bilateral bases. No use of accessory muscles of respiration.  CARDIOVASCULAR: S1, S2 normal. No murmurs, rubs, or gallops.  ABDOMEN: Soft, nontender, nondistended. Bowel sounds present. No organomegaly or mass.  EXTREMITIES: 2+ pedal edema, no cyanosis, or clubbing.  NEUROLOGIC: Cranial nerves II through XII are intact. Muscle strength 5/5 in all extremities. Sensation intact. Gait not checked.  PSYCHIATRIC: The patient is alert and oriented x 3.  SKIN: No rash, lesion, or ulcer.   LABORATORY PANEL:   CBC Recent Labs  Lab 11/20/18 1948  WBC 10.9*  HGB 7.1*  HCT 24.2*  PLT 447*   ------------------------------------------------------------------------------------------------------------------  Chemistries  Recent Labs  Lab 11/20/18 1948  NA 136  K 5.3*  CL 111  CO2 21*  GLUCOSE 54*  BUN 35*  CREATININE 1.07*  CALCIUM 8.6*  AST 22  ALT 14  ALKPHOS 76  BILITOT 0.3   ------------------------------------------------------------------------------------------------------------------  Cardiac Enzymes Recent Labs  Lab 11/20/18 1948  TROPONINI <0.03   ------------------------------------------------------------------------------------------------------------------  RADIOLOGY:  Dg Chest 2 View  Result Date: 11/20/2018 CLINICAL DATA:  Shortness of breath.  Left lower extremity edema. EXAM: CHEST - 2 VIEW COMPARISON:  November 07, 2018 chest x-ray and chest CT FINDINGS: A right Port-A-Cath is stable. Infiltrate in the left lung has worsened in the interval. No other interval changes. IMPRESSION: Worsening infiltrate in the left lung suggesting the possibility of worsening pneumonia. Electronically Signed   By: Dorise Bullion III M.D   On: 11/20/2018 17:32    EKG:   Sinus rhythm 82 bpm  IMPRESSION AND PLAN:    1.  Pneumonia.  ER physician ordered as treating him and vancomycin.  I will switch antibiotics over to Levaquin.  Since symptoms have been going on for a while,  I do not think this is covid-19. 2.  Stage IV squamous cell carcinoma of the lung.  Patient is a full code.  Consulted Dr. Tasia Catchings oncology to see the patient tomorrow. 3.  Type 2 diabetes mellitus.  Continue glargine insulin and sliding scale 4, hypertension continue Norvasc, lisinopril.  Switch Dyazide over the Lasix because she has lower extremity swelling. 5.  Rheumatoid arthritis on Plaquenil and chronic prednisone 6.  Hyperlipidemia unspecified on Lipitor 7.  Chronic pain on oxycodone  All the records are reviewed and case discussed with ED provider. Management plans discussed with the patient, family and they are in agreement.  CODE STATUS: full code  TOTAL TIME TAKING CARE OF THIS PATIENT: 50 minutes.    Loletha Grayer M.D on 11/20/2018 at 9:38 PM  Between 7am to 6pm - Pager - [phone]  After 6pm call admission pager [phone]  [facility] [facility]  [phone]  CC: Primary care physician; Glendon Axe, MD

## 2018-11-20 NOTE — ED Triage Notes (Signed)
Pt to ED by EMS with c/o of SOB and bilateral LE edema. Pt is lung cancer pt and has only left house for treatment at cancer center. No potential expose to [place].

## 2018-11-20 NOTE — Progress Notes (Signed)
Patient ID: Tamara Parrish, female   DOB: 1956/07/05, 63 y.o.   MRN: [id number]  ACP note  Patient present.  Because of the pandemic there is a no visitor policy at [facility] so I had to speak with the patient's daughter on the phone.  CODE STATUS discussed and patient wishes to be a full code.  Diagnosis: Pneumonia, stage IV squamous cell carcinoma of the lung, type 2 diabetes, rheumatoid arthritis, hyperlipidemia, chronic pain on oxycodone, chronic respiratory failure on oxygen  Patient coming to the hospital with shortness of breath and dry cough going on for 2 to 3 weeks.  She was found to have pneumonia.  She does have a history of stage IV squamous cell lung cancer followed by Dr. Tasia Catchings oncology.  Overall prognosis is poor with stage IV squamous cell lung cancer.  Patient wishes to be a full code at this point in time.  Time spent on ACP discussion 17 minutes Dr Loletha Grayer

## 2018-11-21 ENCOUNTER — Inpatient Hospital Stay: Payer: Medicaid Other

## 2018-11-21 ENCOUNTER — Inpatient Hospital Stay: Payer: Medicaid Other | Admitting: Hospice and Palliative Medicine

## 2018-11-21 ENCOUNTER — Inpatient Hospital Stay: Payer: Medicaid Other | Admitting: Oncology

## 2018-11-21 DIAGNOSIS — Z515 Encounter for palliative care: Secondary | ICD-10-CM

## 2018-11-21 DIAGNOSIS — Z87891 Personal history of nicotine dependence: Secondary | ICD-10-CM

## 2018-11-21 DIAGNOSIS — Z9981 Dependence on supplemental oxygen: Secondary | ICD-10-CM

## 2018-11-21 DIAGNOSIS — Z7189 Other specified counseling: Secondary | ICD-10-CM

## 2018-11-21 DIAGNOSIS — J9610 Chronic respiratory failure, unspecified whether with hypoxia or hypercapnia: Secondary | ICD-10-CM

## 2018-11-21 DIAGNOSIS — M069 Rheumatoid arthritis, unspecified: Secondary | ICD-10-CM

## 2018-11-21 DIAGNOSIS — D649 Anemia, unspecified: Secondary | ICD-10-CM

## 2018-11-21 DIAGNOSIS — C799 Secondary malignant neoplasm of unspecified site: Secondary | ICD-10-CM

## 2018-11-21 DIAGNOSIS — J189 Pneumonia, unspecified organism: Principal | ICD-10-CM

## 2018-11-21 DIAGNOSIS — C3490 Malignant neoplasm of unspecified part of unspecified bronchus or lung: Secondary | ICD-10-CM

## 2018-11-21 DIAGNOSIS — G8929 Other chronic pain: Secondary | ICD-10-CM

## 2018-11-21 LAB — PROCALCITONIN: Procalcitonin: 0.15 ng/mL

## 2018-11-21 LAB — CBC
HCT: 22.3 % — ABNORMAL LOW (ref 36.0–46.0)
Hemoglobin: 6.5 g/dL — ABNORMAL LOW (ref 12.0–15.0)
MCH: 30.1 pg (ref 26.0–34.0)
MCHC: 29.1 g/dL — ABNORMAL LOW (ref 30.0–36.0)
MCV: 103.2 fL — ABNORMAL HIGH (ref 80.0–100.0)
Platelets: 384 10*3/uL (ref 150–400)
RBC: 2.16 MIL/uL — ABNORMAL LOW (ref 3.87–5.11)
RDW: 16.8 % — ABNORMAL HIGH (ref 11.5–15.5)
WBC: 10.9 10*3/uL — ABNORMAL HIGH (ref 4.0–10.5)
nRBC: 0 % (ref 0.0–0.2)

## 2018-11-21 LAB — BASIC METABOLIC PANEL
Anion gap: 7 (ref 5–15)
BUN: 29 mg/dL — ABNORMAL HIGH (ref 8–23)
CHLORIDE: 106 mmol/L (ref 98–111)
CO2: 22 mmol/L (ref 22–32)
CREATININE: 0.95 mg/dL (ref 0.44–1.00)
Calcium: 8.6 mg/dL — ABNORMAL LOW (ref 8.9–10.3)
GFR calc Af Amer: >60 mL/min (ref >60)
GFR calc non Af Amer: >60 mL/min (ref >60)
Glucose, Bld: 162 mg/dL — ABNORMAL HIGH (ref 70–99)
Potassium: 5.1 mmol/L (ref 3.5–5.1)
Sodium: 135 mmol/L (ref 135–145)

## 2018-11-21 LAB — RETIC PANEL
Immature Retic Fract: 18.3 % — ABNORMAL HIGH (ref 2.3–15.9)
RBC.: 2.37 MIL/uL — ABNORMAL LOW (ref 3.87–5.11)
Retic Count, Absolute: 51.9 10*3/uL (ref 19.0–186.0)
Retic Ct Pct: 2.2 % (ref 0.4–3.1)
Reticulocyte Hemoglobin: 28.5 pg (ref >27.9)

## 2018-11-21 LAB — GLUCOSE, CAPILLARY
GLUCOSE-CAPILLARY: 171 mg/dL — AB (ref 70–99)
GLUCOSE-CAPILLARY: 135 mg/dL — AB (ref 70–99)
GLUCOSE-CAPILLARY: 153 mg/dL — AB (ref 70–99)
Glucose-Capillary: 38 mg/dL — CL (ref 70–99)
Glucose-Capillary: 137 mg/dL — ABNORMAL HIGH (ref 70–99)
Glucose-Capillary: 157 mg/dL — ABNORMAL HIGH (ref 70–99)

## 2018-11-21 LAB — HEMOGLOBIN AND HEMATOCRIT, BLOOD
HCT: 24.8 % — ABNORMAL LOW (ref 36.0–46.0)
Hemoglobin: 7.5 g/dL — ABNORMAL LOW (ref 12.0–15.0)

## 2018-11-21 LAB — PREPARE RBC (CROSSMATCH)

## 2018-11-21 LAB — LACTATE DEHYDROGENASE: LDH: 126 U/L (ref 98–192)

## 2018-11-21 MED ORDER — DEXTROSE 50 % IV SOLN
INTRAVENOUS | Status: AC
  Filled 2018-11-21: qty 50

## 2018-11-21 MED ORDER — DEXTROSE 50 % IV SOLN
25.0000 mL | Freq: Once | INTRAVENOUS | Status: AC
  Administered 2018-11-21: 25 mL via INTRAVENOUS

## 2018-11-21 MED ORDER — ALUM & MAG HYDROXIDE-SIMETH 200-200-20 MG/5ML PO SUSP
30.0000 mL | Freq: Four times a day (QID) | ORAL | Status: DC | PRN
  Administered 2018-11-21: 15:00:00 30 mL via ORAL
  Filled 2018-11-21: qty 30

## 2018-11-21 MED ORDER — TROLAMINE SALICYLATE 10 % EX CREA
TOPICAL_CREAM | Freq: Two times a day (BID) | CUTANEOUS | Status: DC | PRN
  Administered 2018-11-21 – 2018-11-22 (×3): via TOPICAL
  Filled 2018-11-21: qty 85

## 2018-11-21 MED ORDER — SODIUM CHLORIDE 0.9% IV SOLUTION
Freq: Once | INTRAVENOUS | Status: AC
  Administered 2018-11-21: 11:00:00 via INTRAVENOUS

## 2018-11-21 MED ORDER — OXYCODONE HCL ER 10 MG PO T12A
10.0000 mg | EXTENDED_RELEASE_TABLET | Freq: Two times a day (BID) | ORAL | Status: DC
  Administered 2018-11-21 – 2018-11-22 (×3): 10 mg via ORAL
  Filled 2018-11-21 (×3): qty 1

## 2018-11-21 MED ORDER — ENSURE ENLIVE PO LIQD
237.0000 mL | Freq: Two times a day (BID) | ORAL | Status: DC
  Administered 2018-11-21 – 2018-11-22 (×2): 237 mL via ORAL

## 2018-11-21 NOTE — TOC Initial Note (Signed)
Transition of Care [facility]) - Initial/Assessment Note    Patient Details  Name: Tamara Parrish MRN: [id number] Date of Birth: 12-19-1955  Transition of Care [facility]) CM/SW Contact:    Latanya Maudlin, RN Phone Number: 11/21/2018, 9:50 AM  Clinical Narrative:   [facility] team consulted to assist with disposition. Patient is currently at home with her daughter whom is her main supports. Patient is active with home health via [facility] notified of admission. Patient wishes to resume home health at discharge. Patient has some issues completing activities of daily living primarily because she has shortness of breath on exertion. Patient has home O2 through [facility]. Patient also has rolling walker and bedside commode that she obtained last admission. Patient states she may need a wheelchair at discharge if her breathing does not improve because her ADL are very limited right now. Patient is actively getting treatments at the cancer center. PCP is Glendon Axe                Expected Discharge Plan: [place] Barriers to Discharge: Continued Medical Work up   Patient Goals and CMS Choice Patient states their goals for this hospitalization and ongoing recovery are:: i need to breathe better CMS Medicare.gov Compare Post Acute Care list provided to:: Patient    Expected Discharge Plan and Services Expected Discharge Plan: [place]   Discharge Planning Services: CM Consult Post Acute Care Choice: Home Health, Resumption of Svcs/PTA Provider   Expected Discharge Date: 11/23/18                        Prior Living Arrangements/Services   Lives with:: Adult Children Patient language and need for interpreter reviewed:: No Do you feel safe going back to the place where you live?: Yes      Need for Family Participation in Patient Care: No (Comment) Care giver support system in place?: (daughter) Current home services: DME, Home PT, Home  OT Criminal Activity/Legal Involvement Pertinent to Current Situation/Hospitalization: No - Comment as needed  Activities of Daily Living Home Assistive Devices/Equipment: Cane (specify quad or straight), Walker (specify type) ADL Screening (condition at time of admission) Patient's cognitive ability adequate to safely complete daily activities?: Yes Is the patient deaf or have difficulty hearing?: No Does the patient have difficulty seeing, even when wearing glasses/contacts?: No Does the patient have difficulty concentrating, remembering, or making decisions?: No Patient able to express need for assistance with ADLs?: Yes Does the patient have difficulty dressing or bathing?: No Independently performs ADLs?: No Communication: Needs assistance Is this a change from baseline?: Pre-admission baseline Dressing (OT): Independent Grooming: Independent Feeding: Independent Bathing: Independent Toileting: Independent In/Out Bed: Independent with device (comment) Walks in Home: Independent with device (comment) Does the patient have difficulty walking or climbing stairs?: Yes Weakness of Legs: Both Weakness of Arms/Hands: None  Permission Sought/Granted                  Emotional Assessment Appearance:: Developmentally appropriate Attitude/Demeanor/Rapport: Gracious Affect (typically observed): Accepting Orientation: : Oriented to Self, Oriented to Place, Oriented to  Time, Oriented to Situation      Admission diagnosis:  Community acquired pneumonia, unspecified laterality [J18.9] Patient Active Problem List   Diagnosis Date Noted  . Pneumonia 11/20/2018  . Dehydration 11/02/2018  . Anemia 10/26/2018  . Encounter for antineoplastic chemotherapy 07/12/2018  . Rheumatoid arthritis ([place]) 07/12/2018  . Thrombocytosis ([place]) 07/12/2018  .  Goals of care, counseling/discussion 05/25/2018  . Malignant neoplasm of lung ([place]) 05/25/2018  . Recurrent pneumonia 02/21/2018  . PNA  (pneumonia) 02/07/2018  . Elevated erythrocyte sedimentation rate 12/16/2017  . Dry cough 08/19/2017  . Rheumatoid nodule of multiple sites ([place]) 08/19/2017  . Polyneuropathy associated with underlying disease ([place]) 05/17/2017  . Seasonal allergic rhinitis due to pollen 05/17/2017  . Essential hypertension 10/09/2016  . Pure hypercholesterolemia 10/09/2016  . Rheumatoid arthritis, seropositive ([place]) 03/27/2016  . Polyarthralgia 03/18/2016  . Diabetes mellitus type 2, uncomplicated ([place]) 88/91/6945  . Allergic rhinitis 05/18/2014  . Microalbuminuria 04/18/2014   PCP:  Glendon Axe, MD Pharmacy:   [id number] [place], [place] [address] [place] [address] Phone: [phone] Fax: [phone]     Social Determinants of Health (SDOH) Interventions    Readmission Risk Interventions No flowsheet data found.

## 2018-11-21 NOTE — Progress Notes (Signed)
[facility] [place] NAME: Tamara Parrish    MR#:  [id number]  DATE OF BIRTH:  1955-12-02  SUBJECTIVE:   Patient here with shortness of breath and found to have pneumonia  REVIEW OF SYSTEMS:    Review of Systems  Constitutional: Negative for fever, chills weight loss HENT: Negative for ear pain, nosebleeds, congestion, facial swelling, rhinorrhea, neck pain, neck stiffness and ear discharge.   Respiratory: ++ for cough, shortness of breath, no wheezing  Cardiovascular: Negative for chest pain, palpitations and leg swelling.  Gastrointestinal: Negative for heartburn, abdominal pain, vomiting, diarrhea or consitpation Genitourinary: Negative for dysuria, urgency, frequency, hematuria Musculoskeletal: Negative for back pain or joint pain Neurological: Negative for dizziness, seizures, syncope, focal weakness,  numbness and headaches.  Hematological: Does not bruise/bleed easily.  Psychiatric/Behavioral: Negative for hallucinations, confusion, dysphoric mood    Tolerating Diet: yes      DRUG ALLERGIES:   Allergies  Allergen Reactions  . Ibuprofen Other (See Comments)    Is not supposed to take because of her kidneys.  . Penicillins Other (See Comments)    Has patient had a PCN reaction causing immediate rash, facial/tongue/throat swelling, SOB or lightheadedness with hypotension: Unknown Has patient had a PCN reaction causing severe rash involving mucus membranes or skin necrosis: Unknown Has patient had a PCN reaction that required hospitalization: Unknown Has patient had a PCN reaction occurring within the last 10 years: Unknown If all of the above answers are "NO", then may proceed with Cephalosporin use.    VITALS:  Blood pressure 135/66, pulse 89, temperature 98.3 F (36.8 C), temperature source Oral, resp. rate (!) 26, height 5\' 6"  (1.676 m), weight 85.5 kg, SpO2 98 %.  PHYSICAL EXAMINATION:  Constitutional: Appears  well-developed and well-nourished. No distress. HENT: Normocephalic. Marland Kitchen Oropharynx is clear and moist.  Eyes: Conjunctivae and EOM are normal. PERRLA, no scleral icterus.  Neck: Normal ROM. Neck supple. No JVD. No tracheal deviation. CVS: RRR, S1/S2 +, no murmurs, no gallops, no carotid bruit.  Pulmonary: Effort and breath sounds normal, no stridor, rhonchi, wheezes, rales.  Abdominal: Soft. BS +,  no distension, tenderness, rebound or guarding.  Musculoskeletal: Normal range of motion. No edema and no tenderness.  Neuro: Alert. CN 2-12 grossly intact. No focal deficits. Skin: Skin is warm and dry. No rash noted. Psychiatric: Normal mood and affect.      LABORATORY PANEL:   CBC Recent Labs  Lab 11/21/18 0558  WBC 10.9*  HGB 6.5*  HCT 22.3*  PLT 384   ------------------------------------------------------------------------------------------------------------------  Chemistries  Recent Labs  Lab 11/20/18 1948 11/21/18 0558  NA 136 135  K 5.3* 5.1  CL 111 106  CO2 21* 22  GLUCOSE 54* 162*  BUN 35* 29*  CREATININE 1.07* 0.95  CALCIUM 8.6* 8.6*  AST 22  --   ALT 14  --   ALKPHOS 76  --   BILITOT 0.3  --    ------------------------------------------------------------------------------------------------------------------  Cardiac Enzymes Recent Labs  Lab 11/20/18 1948  TROPONINI <0.03   ------------------------------------------------------------------------------------------------------------------  RADIOLOGY:  Dg Chest 2 View  Result Date: 11/20/2018 CLINICAL DATA:  Shortness of breath.  Left lower extremity edema. EXAM: CHEST - 2 VIEW COMPARISON:  November 07, 2018 chest x-ray and chest CT FINDINGS: A right Port-A-Cath is stable. Infiltrate in the left lung has worsened in the interval. No other interval changes. IMPRESSION: Worsening infiltrate in the left lung suggesting the possibility of worsening pneumonia. Electronically Signed  By: Dorise Bullion III M.D    On: 11/20/2018 17:32     ASSESSMENT AND PLAN:    63 year old female with history of malignant neoplasm of lung chronic hypoxic respiratory failure on 3 L of oxygen, anemia of chronic disease, rheumatoid arthritis and diabetes who presents to the emergency room due to shortness of breath.  1.  Chronic hypoxic respiratory failure with pneumonia: Continue Levaquin  2.  Stage IV squamous cell carcinoma of lung: Patient will be seen by Dr. Tasia Catchings during lunchtime  3.  Acute on chronic anemia: I have spoken with Dr. Tasia Catchings this morning and we will transfuse irradiated blood. Continue PPI and Carafate  4.  Rheumatoid arthritis: Continue Plaquenil and prednisone  5.  Hyperlipidemia: Continue statin    Management plans discussed with the patient and she is in agreement.  CODE STATUS: full  TOTAL TIME TAKING CARE OF THIS PATIENT: 30 minutes.     POSSIBLE D/C 2-3 days, DEPENDING ON CLINICAL CONDITION.   Bettey Costa M.D on 11/21/2018 at 10:14 AM  Between 7am to 6pm - Pager - [phone] After 6pm go to [email or web address] - password [email or web address] [facility]  Office  [phone]  CC: Primary care physician; Glendon Axe, MD  Note: This dictation was prepared with Dragon dictation along with smaller phrase technology. Any transcriptional errors that result from this process are unintentional.

## 2018-11-21 NOTE — Evaluation (Signed)
Physical Therapy Evaluation Patient Details Name: Tamara Parrish MRN: [id number] DOB: 07-20-1956 Today's Date: 11/21/2018   History of Present Illness  Patient is a pleasant 63 year old female who was admitted for increased shortness of breath, found to have pneumonia. PMH includes lung cancer (stage IV), collagen vascular disease, DM 2, dyspnea, HLD, HTN, osteomyelitis, pneumonia, and RA. Patient having severe back pain and shortness of breath limiting her mobility.   Clinical Impression   Patient is a pleasant 63 year old female with diffuse weakness and limited capacity for mobility secondary to admission for pneumonia and PMH of stage IV lung cancer, CVD, and RA. Patient was unwilling to transfer from sit to stand or attempt ambulation due to fatigue/pain in back (8/10 pain). However was willing to sit EOB for EOB stability, strength, and postural activation. Patient required assistance with moving LE's  Into/out of bed, with fatigue noted quickly upon muscle activation. Sp02 monitored as patient is on 3L 02 via nasal cannula. Patient unable to talk and perform muscle activation at same time due to fatigue/limited capacity for dual tasking at this time. A trial period of 3-5 physical therapy sessions will be beneficial to assess patient's current mobility status and needs upon discharge due to limitations this session. Skilled physical therapy will be beneficial during admission due to LE weakness and limited capacity for mobility.     Follow Up Recommendations Home health PT;Supervision/Assistance - 24 hour(A trial period of 3-5 physical therapy sessions will be beneficial to assess patient's current mobility status and needs upon discharge)    Equipment Recommendations       Recommendations for Other Services OT consult     Precautions / Restrictions Precautions Precautions: Fall Precaution Comments: 3L 02 via nasal cannula, weakness      Mobility  Bed Mobility Overal bed  mobility: Needs Assistance Bed Mobility: Supine to Sit;Sit to Supine     Supine to sit: Min assist Sit to supine: Mod assist   General bed mobility comments: Patient required assitance moving/guiding her LE's on/off the bed, more challenged bringing LE's back into bed. Requires additional time for task completion.   Transfers                 General transfer comment: patient unwilling to transfer, reports she is in too much pain and too tired.   Ambulation/Gait             General Gait Details: patient unwilling to transfer, reports she is in too much pain and too tired.   Stairs            Wheelchair Mobility    Modified Rankin (Stroke Patients Only)       Balance Overall balance assessment: Needs assistance Sitting-balance support: Single extremity supported;Feet supported Sitting balance-Leahy Scale: Poor Sitting balance - Comments: heavy lean to right side, requires at least SUE to sit EOB, unable to sit without UE support Postural control: Right lateral lean     Standing balance comment: unable to assess standing/patient unwilling/unable to stand due to fatigue/pain.                              Pertinent Vitals/Pain Pain Assessment: 0-10 Pain Score: 8  Pain Location: back, neck, hips  Pain Descriptors / Indicators: Aching;Stabbing;Throbbing Pain Intervention(s): Limited activity within patient's tolerance;Monitored during session;Repositioned    Home Living Family/patient expects to be discharged to:: Private residence Living Arrangements: Children Available Help  at Discharge: Family;Available 24 hours/day Type of Home: House Home Access: Level entry     Home Layout: One level Home Equipment: Walker - 2 wheels;Bedside commode Additional Comments: Patient utilizes walker at home with daughter helping getting up and down from commode.     Prior Function Level of Independence: Needs assistance   Gait / Transfers Assistance  Needed: Daughter helps patient transfer into/out of car for chemo treatments.   ADL's / Homemaking Assistance Needed: Daughter assists with bathing (bird baths) and dressing.   Comments: Mostly sedentary/household ambulation with assistance.      Hand Dominance   Dominant Hand: Left    Extremity/Trunk Assessment   Upper Extremity Assessment Upper Extremity Assessment: Defer to OT evaluation    Lower Extremity Assessment Lower Extremity Assessment: Generalized weakness(Gross 3/5 strength bilaterally with hips 2+/5. Fatigues quickly against resistance, increased back pain with movement)       Communication   Communication: No difficulties  Cognition Arousal/Alertness: Awake/alert Behavior During Therapy: WFL for tasks assessed/performed Overall Cognitive Status: Within Functional Limits for tasks assessed                                 General Comments: A and O x4, became short of breathe answering questions however Sp02 levels remained 98%      General Comments General comments (skin integrity, edema, etc.): decreasing edema of bilateral LE's    Exercises Other Exercises Other Exercises: supine: ankle pumps, abduction/adduction (AAROM due to fatigue), mobility rolling to L and R with Min A Other Exercises: Seated EOB stability x 7 minutes. unable to reach outside BOS. unable to lift UE's.    Assessment/Plan    PT Assessment Patient needs continued PT services  PT Problem List Decreased strength;Decreased activity tolerance;Decreased mobility;Decreased balance;Pain;Cardiopulmonary status limiting activity       PT Treatment Interventions DME instruction;Gait training;Therapeutic exercise;Therapeutic activities;Functional mobility training;Balance training;Neuromuscular re-education;Wheelchair mobility training;Patient/family education    PT Goals (Current goals can be found in the Care Plan section)  Acute Rehab PT Goals Patient Stated Goal: to return  home PT Goal Formulation: With patient Time For Goal Achievement: 12/05/18 Potential to Achieve Goals: Fair    Frequency Min 2X/week   Barriers to discharge Decreased caregiver support will need aide at home    Co-evaluation               AM-PAC PT "6 Clicks" Mobility  Outcome Measure Help needed turning from your back to your side while in a flat bed without using bedrails?: A Little Help needed moving from lying on your back to sitting on the side of a flat bed without using bedrails?: A Lot Help needed moving to and from a bed to a chair (including a wheelchair)?: A Lot Help needed standing up from a chair using your arms (e.g., wheelchair or bedside chair)?: A Lot Help needed to walk in hospital room?: Total Help needed climbing 3-5 steps with a railing? : Total 6 Click Score: 11    End of Session Equipment Utilized During Treatment: Oxygen(3 L 02 via nasal cannula) Activity Tolerance: Patient limited by fatigue;Patient limited by pain Patient left: in bed;with bed alarm set;with call bell/phone within reach Nurse Communication: Mobility status PT Visit Diagnosis: Unsteadiness on feet (R26.81);Other abnormalities of gait and mobility (R26.89);Muscle weakness (generalized) (M62.81);Pain Pain - Right/Left: Left Pain - part of body: Hip(back)    Time: 5366-4403 PT Time Calculation (min) (ACUTE ONLY):  24 min   Charges:   PT Evaluation $PT Eval Low Complexity: 1 Low PT Treatments $Therapeutic Activity: 8-22 mins        Janna Arch, PT, DPT   Janna Arch 11/21/2018, 4:11 PM

## 2018-11-21 NOTE — Plan of Care (Signed)
In am Hgb 6.5. 1xunit of RBC's without adverse effect. Last Hbg 7.5.  Oxycodone given for generalized chronic pain with improvement.  Repositioned pt qx2hrs and per pt request.  Pt c/o of pain in gluteal folds. Pt refuses sacral dressing and refuses RN to assess buttocks. Pt unable to tolerate 30 degrees due to sob. Educated pt about risk for pressure injury. Pt verbalized understanding and called during the shift frequently to be repositioned.

## 2018-11-21 NOTE — Consult Note (Signed)
[facility]  [phone] [phone]   Name: Tamara Parrish Date: 11/21/2018 MRN: [id number]  DOB: Dec 28, 1955  Patient Care Team: Glendon Axe, MD as PCP - General (Internal Medicine) Telford Nab, RN as Registered Nurse    REASON FOR CONSULTATION: Palliative Care consult requested for this 63 y.o. female with multiple medical problems including stage IV lung cancer, chronic respiratory failure on home O2, severe RA, chronic pain, who was admitted to the hospital on 11/20/2018 with shortness of breath.  Patient was found to have pneumonia on chest x-ray.  Palliative care was consulted to help address goals.   SOCIAL HISTORY:     reports that she quit smoking about a year ago. Her smoking use included cigarettes. She has a 40.00 pack-year smoking history. She has never used smokeless tobacco. She reports that she does not drink alcohol or use drugs.   Patient is divorced.  She has 2 daughters, both of whom live locally.  Patient rotates her residence between her daughters.  Patient formally worked as a Programmer, applications.  ADVANCE DIRECTIVES:  Does not have  CODE STATUS: Full code  PAST MEDICAL HISTORY: Past Medical History:  Diagnosis Date  . Cancer ([place])    lung ca DX 2019  . Collagen vascular disease ([facility])    RA  . DM2 (diabetes mellitus, type 2) ([place])   . Dyspnea   . Hyperlipemia   . Hypertension   . Osteomyelitis ([place])   . Pneumonia   . RA (rheumatoid arthritis) ([place])   . Rheumatoid arthritis ([place])     PAST SURGICAL HISTORY:  Past Surgical History:  Procedure Laterality Date  . ENDOBRONCHIAL ULTRASOUND N/A 05/13/2018   Procedure: ENDOBRONCHIAL ULTRASOUND;  Surgeon: [name], MD;  Location: [facility];  Service: Pulmonary;  Laterality: N/A;  . PORTA CATH INSERTION N/A 05/27/2018   Procedure: PORTA CATH INSERTION;  Surgeon: Katha Cabal, MD;  Location: [facility];   Service: Cardiovascular;  Laterality: N/A;  . TOE SURGERY      HEMATOLOGY/ONCOLOGY HISTORY:    Malignant neoplasm of lung ([place])   05/25/2018 Initial Diagnosis    Malignant neoplasm of lung ([place])    05/31/2018 - 10/05/2018 Chemotherapy    The patient had palonosetron (ALOXI) injection 0.25 mg, 0.25 mg, Intravenous,  Once, 6 of 6 cycles Administration: 0.25 mg (05/31/2018), 0.25 mg (06/21/2018), 0.25 mg (07/12/2018), 0.25 mg (08/03/2018), 0.25 mg (08/25/2018), 0.25 mg (09/15/2018) pegfilgrastim-cbqv (UDENYCA) injection 6 mg, 6 mg, Subcutaneous, Once, 2 of 2 cycles Administration: 6 mg (06/02/2018) CARBOplatin (PARAPLATIN) 690 mg in sodium chloride 0.9 % 250 mL chemo infusion, 690 mg (100 % of original dose 693.6 mg), Intravenous,  Once, 6 of 6 cycles Dose modification:   (original dose 693.6 mg, Cycle 1) Administration: 690 mg (05/31/2018), 450 mg (06/21/2018), 540 mg (07/12/2018), 540 mg (08/03/2018), 450 mg (08/25/2018), 500 mg (09/15/2018) PACLitaxel (TAXOL) 342 mg in sodium chloride 0.9 % 500 mL chemo infusion (> '80mg'$ /m2), 175 mg/m2 = 342 mg (87.5 % of original dose 200 mg/m2), Intravenous,  Once, 6 of 6 cycles Dose modification: 175 mg/m2 (original dose 200 mg/m2, Cycle 1, Reason: Patient Age), 135 mg/m2 (original dose 200 mg/m2, Cycle 3, Reason: Other (see comments), Comment: neuropathy) Administration: 342 mg (05/31/2018), 342 mg (06/21/2018), 264 mg (07/12/2018), 264 mg (08/03/2018), 264 mg (08/25/2018), 264 mg (09/15/2018)  for chemotherapy treatment.     10/12/2018 -  Chemotherapy    The patient had palonosetron ([place])  injection 0.25 mg, 0.25 mg, Intravenous,  Once, 1 of 6 cycles Administration: 0.25 mg (10/12/2018) CARBOplatin (PARAPLATIN) 550 mg in sodium chloride 0.9 % 250 mL chemo infusion, 550 mg (100 % of original dose 545.5 mg), Intravenous,  Once, 1 of 6 cycles Dose modification:   (original dose 545.5 mg, Cycle 1) Administration: 550 mg (10/12/2018) PACLitaxel (TAXOL) 276 mg in sodium  chloride 0.9 % 250 mL chemo infusion (> '80mg'$ /m2), 135 mg/m2 = 276 mg (100 % of original dose 135 mg/m2), Intravenous,  Once, 1 of 6 cycles Dose modification: 135 mg/m2 (original dose 135 mg/m2, Cycle 1, Reason: Dose not tolerated) Administration: 276 mg (10/12/2018) pembrolizumab (KEYTRUDA) 200 mg in sodium chloride 0.9 % 50 mL chemo infusion, 200 mg, Intravenous, Once, 1 of 6 cycles Administration: 200 mg (10/12/2018) fosaprepitant (EMEND) 150 mg, dexamethasone (DECADRON) 12 mg in sodium chloride 0.9 % 145 mL IVPB, , Intravenous,  Once, 1 of 6 cycles Administration:  (10/12/2018)  for chemotherapy treatment.      ALLERGIES:  is allergic to ibuprofen and penicillins.  MEDICATIONS:  Current Facility-Administered Medications  Medication Dose Route Frequency Provider Last Rate Last Dose  . acetaminophen (TYLENOL) tablet 650 mg  650 mg Oral Q6H PRN Loletha Grayer, MD   650 mg at 11/20/18 2331   Or  . acetaminophen (TYLENOL) suppository 650 mg  650 mg Rectal Q6H PRN Wieting, Richard, MD      . albuterol (PROVENTIL) (2.5 MG/3ML) 0.083% nebulizer solution 3 mL  3 mL Inhalation Q6H Loletha Grayer, MD   3 mL at 11/21/18 0850  . amLODipine (NORVASC) tablet 5 mg  5 mg Oral Daily Loletha Grayer, MD   5 mg at 11/21/18 1103  . aspirin chewable tablet 81 mg  81 mg Oral Daily Loletha Grayer, MD   81 mg at 11/21/18 1103  . atorvastatin (LIPITOR) tablet 40 mg  40 mg Oral Daily Loletha Grayer, MD   40 mg at 11/20/18 2321  . feeding supplement (ENSURE ENLIVE) (ENSURE ENLIVE) liquid 237 mL  237 mL Oral BID BM Mody, Sital, MD      . ferrous sulfate tablet 325 mg  325 mg Oral BID WC Loletha Grayer, MD   325 mg at 11/21/18 0813  . folic acid (FOLVITE) tablet 1 mg  1 mg Oral Daily Loletha Grayer, MD   1 mg at 11/21/18 1103  . furosemide (LASIX) tablet 20 mg  20 mg Oral Daily Loletha Grayer, MD   20 mg at 11/21/18 1103  . hydroxychloroquine (PLAQUENIL) tablet 200 mg  200 mg Oral BID Loletha Grayer,  MD   200 mg at 11/21/18 1121  . insulin aspart (novoLOG) injection 0-5 Units  0-5 Units Subcutaneous QHS Wieting, Richard, MD      . insulin aspart (novoLOG) injection 0-9 Units  0-9 Units Subcutaneous TID WC Loletha Grayer, MD   1 Units at 11/21/18 1245  . levofloxacin (LEVAQUIN) IVPB 750 mg  750 mg Intravenous Q24H Loletha Grayer, MD 100 mL/hr at 11/20/18 2354 750 mg at 11/20/18 2354  . lisinopril (PRINIVIL,ZESTRIL) tablet 40 mg  40 mg Oral Daily Loletha Grayer, MD   40 mg at 11/21/18 1103  . metoprolol tartrate (LOPRESSOR) tablet 25 mg  25 mg Oral BID Loletha Grayer, MD   25 mg at 11/21/18 1103  . mirtazapine (REMERON) tablet 7.5 mg  7.5 mg Oral QHS Loletha Grayer, MD   7.5 mg at 11/20/18 2322  . mometasone-formoterol (DULERA) 200-5 MCG/ACT inhaler 2 puff  2 puff Inhalation BID  Loletha Grayer, MD   2 puff at 11/21/18 9604  . multivitamin with minerals tablet 1 tablet  1 tablet Oral Daily Loletha Grayer, MD   1 tablet at 11/21/18 1103  . nystatin (MYCOSTATIN) 100000 UNIT/ML suspension 500,000 Units  5 mL Oral QID Loletha Grayer, MD   500,000 Units at 11/21/18 1114  . ondansetron (ZOFRAN) tablet 4 mg  4 mg Oral Q6H PRN Loletha Grayer, MD       Or  . ondansetron [facility]) injection 4 mg  4 mg Intravenous Q6H PRN Wieting, Richard, MD      . oxyCODONE (Oxy IR/ROXICODONE) immediate release tablet 10 mg  10 mg Oral Q6H PRN Loletha Grayer, MD   10 mg at 11/21/18 1104  . pantoprazole (PROTONIX) EC tablet 40 mg  40 mg Oral Daily Loletha Grayer, MD   40 mg at 11/21/18 1103  . predniSONE (DELTASONE) tablet 5 mg  5 mg Oral Q breakfast Loletha Grayer, MD   5 mg at 11/21/18 0813  . prochlorperazine (COMPAZINE) tablet 10 mg  10 mg Oral Q6H PRN Wieting, Richard, MD      . sodium chloride flush (NS) 0.9 % injection 10-40 mL  10-40 mL Intracatheter PRN Loletha Grayer, MD   10 mL at 11/21/18 1108  . sucralfate (CARAFATE) tablet 1 g  1 g Oral TID WC & HS Loletha Grayer, MD   1 g at  11/21/18 1244    VITAL SIGNS: BP 126/65 (BP Location: Left Arm)   Pulse 88   Temp 98.2 F (36.8 C) (Oral)   Resp (!) 24   Ht '5\' 6"'$  (1.676 m)   Wt 188 lb 8 oz (85.5 kg)   SpO2 100%   BMI 30.42 kg/m  Filed Weights   11/20/18 2154  Weight: 188 lb 8 oz (85.5 kg)    Estimated body mass index is 30.42 kg/m as calculated from the following:   Height as of this encounter: '5\' 6"'$  (1.676 m).   Weight as of this encounter: 188 lb 8 oz (85.5 kg).  LABS: CBC:    Component Value Date/Time   WBC 10.9 (H) 11/21/2018 0558   HGB 6.5 (L) 11/21/2018 0558   HGB 11.2 (L) 02/12/2014 0347   HCT 22.3 (L) 11/21/2018 0558   HCT 33.7 (L) 02/12/2014 0347   PLT 384 11/21/2018 0558   PLT 404 02/12/2014 0347   MCV 103.2 (H) 11/21/2018 0558   MCV 91 02/12/2014 0347   NEUTROABS 14.0 (H) 11/07/2018 0928   NEUTROABS 5.7 02/12/2014 0347   LYMPHSABS 0.6 (L) 11/07/2018 0928   LYMPHSABS 2.9 02/12/2014 0347   MONOABS 0.8 11/07/2018 0928   MONOABS 1.2 (H) 02/12/2014 0347   EOSABS 0.2 11/07/2018 0928   EOSABS 0.2 02/12/2014 0347   BASOSABS 0.0 11/07/2018 0928   BASOSABS 0.1 02/12/2014 0347   Comprehensive Metabolic Panel:    Component Value Date/Time   NA 135 11/21/2018 0558   NA 133 (L) 02/09/2014 0526   K 5.1 11/21/2018 0558   K 4.2 02/09/2014 0526   CL 106 11/21/2018 0558   CL 102 02/09/2014 0526   CO2 22 11/21/2018 0558   CO2 26 02/09/2014 0526   BUN 29 (H) 11/21/2018 0558   BUN 11 02/09/2014 0526   CREATININE 0.95 11/21/2018 0558   CREATININE 0.78 02/09/2014 0526   GLUCOSE 162 (H) 11/21/2018 0558   GLUCOSE 220 (H) 02/09/2014 0526   CALCIUM 8.6 (L) 11/21/2018 0558   CALCIUM 8.4 (L) 02/09/2014 0526   AST 22  11/20/2018 1948   AST 16 02/08/2014 1034   ALT 14 11/20/2018 1948   ALT 12 02/08/2014 1034   ALKPHOS 76 11/20/2018 1948   ALKPHOS 113 02/08/2014 1034   BILITOT 0.3 11/20/2018 1948   BILITOT 0.3 02/08/2014 1034   PROT 7.4 11/20/2018 1948   PROT 7.6 02/08/2014 1034   ALBUMIN 2.1  (L) 11/20/2018 1948   ALBUMIN 2.9 (L) 02/08/2014 1034    RADIOGRAPHIC STUDIES: Dg Chest 2 View  Result Date: 11/20/2018 CLINICAL DATA:  Shortness of breath.  Left lower extremity edema. EXAM: CHEST - 2 VIEW COMPARISON:  November 07, 2018 chest x-ray and chest CT FINDINGS: A right Port-A-Cath is stable. Infiltrate in the left lung has worsened in the interval. No other interval changes. IMPRESSION: Worsening infiltrate in the left lung suggesting the possibility of worsening pneumonia. Electronically Signed   By: Dorise Bullion III M.D   On: 11/20/2018 17:32   Dg Chest 2 View  Result Date: 11/07/2018 CLINICAL DATA:  History of lung carcinoma with recent cough EXAM: CHEST - 2 VIEW COMPARISON:  11/02/2018 FINDINGS: Cardiac shadow is stable. Right chest wall port is again seen. Considerable scarring is again noted in the left lung consistent with the given clinical history. No acute infiltrate is identified. No pneumothorax is seen. No acute bony abnormality is noted. IMPRESSION: Extensive scarring in the left hemithorax. No acute infiltrate is noted. Electronically Signed   By: Inez Catalina M.D.   On: 11/07/2018 10:18   Dg Chest 2 View  Result Date: 11/02/2018 CLINICAL DATA:  Nonproductive cough.  Lung cancer. EXAM: CHEST - 2 VIEW COMPARISON:  Chest x-rays dated 10/26/2018 and 05/13/2018 and chest CT dated 10/04/2018 FINDINGS: Power port in place, unchanged with the tip in the superior vena cava at the level of the carina. There is chronic interstitial disease at the right lung apex and right base. There is chronic pleural thickening in the left hemithorax with chronic accentuation of the interstitial markings diffusely. Heart size and pulmonary vascularity are normal. Aortic atherosclerosis. No significant bone abnormality. No significant change since the prior exam. IMPRESSION: Stable appearance of the chest since 10/26/2018. Extensive chronic lung disease. Extensive scarring in the left hemithorax as  demonstrated on the prior chest x-ray and prior chest CT. Electronically Signed   By: Lorriane Shire M.D.   On: 11/02/2018 13:20   Dg Chest 2 View  Result Date: 10/26/2018 CLINICAL DATA:  Acute shortness of breath and weakness. Patient with lung cancer on chemotherapy. EXAM: CHEST - 2 VIEW COMPARISON:  10/04/2018 chest CT and prior studies FINDINGS: The cardiomediastinal silhouette is unchanged. RIGHT Port-A-Cath again noted with tip overlying the LOWER SVC. LEFT lung opacities and LEFT pleural thickening again noted. No definite acute abnormality noted.  No pneumothorax. No acute bony abnormalities are identified. IMPRESSION: LEFT lung and pleural opacities again noted without definite acute abnormality. Electronically Signed   By: Margarette Canada M.D.   On: 10/26/2018 16:10   Ct Angio Chest Pe W Or Wo Contrast  Result Date: 11/07/2018 CLINICAL DATA:  Thoracic spine pain for 1 week radiating into the left side of the chest. EXAM: CT ANGIOGRAPHY CHEST WITH CONTRAST TECHNIQUE: Multidetector CT imaging of the chest was performed using the standard protocol during bolus administration of intravenous contrast. Multiplanar CT image reconstructions and MIPs were obtained to evaluate the vascular anatomy. CONTRAST:  60 mL ISOVUE-370 IOPAMIDOL (ISOVUE-370) INJECTION 76% COMPARISON:  CT chest 10/04/2018, 05/05/2018 and 02/21/2018. Plain films of the chest 10/26/2018 and  earlier today. FINDINGS: Cardiovascular: Heart size is upper normal. No pericardial effusion. Enlarged main pulmonary artery and right and left pulmonary arteries again seen. No pulmonary embolus is identified. Aortic atherosclerosis is noted. Port-A-Cath remains in place. Mediastinum/Nodes: No enlarged mediastinal, hilar, or axillary lymph nodes. Thyroid gland, trachea, and esophagus demonstrate no significant findings. Lungs/Pleura: Again seen is a cavitary lesion in the left upper lobe measuring approximately 7.9 x 3.7 cm on image 36, unchanged since  the most recent exam. Centrilobular emphysema is again identified. There is new airspace disease in the left lung base. Bronchiectasis in the left base is noted. No consolidative process on the right. Upper Abdomen: Negative. Musculoskeletal: No acute or focal abnormality. Review of the MIP images confirms the above findings. IMPRESSION: Negative for pulmonary embolus. Airspace disease in the left lung base which is new since the most recent CT could be due to atelectasis or pneumonia. No change in a partially cavitary left upper lobe mass since the most recent exam. Enlarged pulmonary arteries compatible with pulmonary arterial hypertension, unchanged. Aortic Atherosclerosis (ICD10-I70.0) and Emphysema (ICD10-J43.9). Electronically Signed   By: Inge Rise M.D.   On: 11/07/2018 14:48   US Venous Img Lower Bilateral  Result Date: 11/07/2018 CLINICAL DATA:  Shortness of breath, leg swelling x1 week EXAM: BILATERAL LOWER EXTREMITY VENOUS DOPPLER ULTRASOUND TECHNIQUE: Gray-scale sonography with compression, as well as color and duplex ultrasound, were performed to evaluate the deep venous system from the level of the common femoral vein through the popliteal and proximal calf veins. COMPARISON:  None FINDINGS: Normal compressibility of the common femoral, superficial femoral, and popliteal veins, as well as the proximal calf veins. No filling defects to suggest DVT on grayscale or color Doppler imaging. Doppler waveforms show normal direction of venous flow, normal respiratory phasicity and response to augmentation. IMPRESSION: No femoropopliteal and no calf DVT in the visualized calf veins. If clinical symptoms are inconsistent or if there are persistent or worsening symptoms, further imaging (possibly involving the iliac veins) may be warranted. Electronically Signed   By: Lucrezia Europe M.D.   On: 11/07/2018 13:34    PERFORMANCE STATUS (ECOG) : 2 - Symptomatic, <50% confined to bed  REVIEW OF SYSTEMS: As  noted above. Otherwise, a complete review of systems is negative.  PHYSICAL EXAM: General: NAD, frail appearing, thin Pulmonary: Unlabored, on O2 Extremities: Edema Skin: no rashes Neurological: Weakness but otherwise nonfocal  IMPRESSION: Patient was scheduled to see me today in the clinic but was hospitalized.  I met with patient today in the hospital.  I introduced palliative care services and attempted to establish therapeutic rapport.  At baseline, patient lives at home and rotates her residence between her daughters.  She is chronically short of breath and requires oxygen.  She requires assistance from daughters with bathing and dressing.  She ambulates with use of a walker.  Patient's goals appear aligned with current scope of treatment.  She has been started on outpatient treatment with carboplatin, paclitaxel, and pembrolizumab.  Symptomatically, patient has chronic pain in joints from her RA.  Pain is impairing her functioning and is exacerbated with movement.  Patient is chronically on oxycodone 10 mg tablets, which she takes every 6 hours as needed.  She says the oxycodone helps but is short-lived.  We discussed options including increasing the frequency of her oxycodone versus starting a long-acting opioid.  Patient does not have ACP documents and wants to give some consideration prior to completing those.  She says she primarily  would want her daughter Theadora Rama and her brother (who lives in [place]) to be her decision makers if needed.  I encouraged her to consider ACP documents and we can also facilitate completing those in the ambulatory setting.  We discussed CODE STATUS.  Patient was not fully decided but said that she does not think that she would want to be intubated or resuscitated.  She would benefit from completing a MOST Form and I can also facilitate that an ambulatory setting.  PLAN: -Continue current scope of treatment -Continue oxycodone prn for BTP. Consider  liberalizing frequency if needed -Start OxyContin '10mg'$  Q12H -Recommend MOST form and will facilitate completion in ambulatory setting -Will plan outpatient follow up in the clinic following discharge from the hospital   Time Total: 45 minutes  Visit consisted of counseling and education dealing with the complex and emotionally intense issues of symptom management and palliative care in the setting of serious and potentially life-threatening illness.Greater than 50%  of this time was spent counseling and coordinating care related to the above assessment and plan.  Signed by: Altha Harm, PhD, NP-C [phone] (Work Cell)

## 2018-11-21 NOTE — Consult Note (Signed)
Hematology/Oncology Consult note [facility] [phone] [phone]  Patient Care Team: Glendon Axe, MD as PCP - General (Internal Medicine) Telford Nab, RN as Registered Nurse   Name of the patient: Tamara Parrish  [id number]  1956-08-23   Date of visit: 11/21/18 REASON FOR COSULTATION:   History of presenting illness-  63 y.o. female with PMH listed at below, known history of metastatic lung cancer on chemotherapy treatment who presents for evaluation of shortness of breath.  Patient states that shortness of breath has been ongoing for a while and getting worse.  Also feels very fatigued and tired.  He has been having a dry cough for the past 2 to 3 weeks.  Ran out of inhaler.  Patient's last chemotherapy has been held since early March due to upper respiratory symptoms.  Patient symptoms initially gets better after course of azithromycin.  Outpatient CT chest angiogram was obtained on 11/07/2018 to rule out PE.  PE was negative.  There was airspace disease in the left lung base which is new since most recent CT could be due to atelectasis or pneumonia.   Left upper lobe cavitary lesion measuring 7.9 x 3.7 cm unchanged since exam on 10/04/2018.  In the ED Chest x-ray showed worsening infiltration in the left lung suggesting possibility of worsening pneumonia.  Patient was admitted and started on IV antibiotics. She was also found to have hemoglobin of 7.1 on 11/20/2018.  Today it further decreased to 6.5.  MCV 103. Patient was seen and examined at bedside today. She is getting 1 unit of irradiated PRBC transfusion.  Reports feeling slightly better since admission.  Continues to have cough.  No fever.  Cough is dry nonproductive.  She reports severe multiple joint pain from rheumatoid arthritis.  She takes oxycodone 10 mg every 4 hours around-the-clock.  Review of Systems  Constitutional: Positive for appetite change, fatigue and unexpected  weight change. Negative for chills and fever.  HENT:   Negative for hearing loss and voice change.   Eyes: Negative for eye problems.  Respiratory: Positive for cough. Negative for chest tightness and shortness of breath.   Cardiovascular: Negative for chest pain.  Gastrointestinal: Negative for abdominal distention, abdominal pain and blood in stool.  Endocrine: Negative for hot flashes.  Genitourinary: Negative for difficulty urinating and frequency.   Musculoskeletal: Positive for arthralgias.  Skin: Negative for itching and rash.  Neurological: Negative for extremity weakness.  Hematological: Negative for adenopathy.  Psychiatric/Behavioral: Negative for confusion.    Allergies  Allergen Reactions  . Ibuprofen Other (See Comments)    Is not supposed to take because of her kidneys.  . Penicillins Other (See Comments)    Has patient had a PCN reaction causing immediate rash, facial/tongue/throat swelling, SOB or lightheadedness with hypotension: Unknown Has patient had a PCN reaction causing severe rash involving mucus membranes or skin necrosis: Unknown Has patient had a PCN reaction that required hospitalization: Unknown Has patient had a PCN reaction occurring within the last 10 years: Unknown If all of the above answers are "NO", then may proceed with Cephalosporin use.    Patient Active Problem List   Diagnosis Date Noted  . Palliative care encounter   . Pneumonia 11/20/2018  . Dehydration 11/02/2018  . Anemia 10/26/2018  . Encounter for antineoplastic chemotherapy 07/12/2018  . Rheumatoid arthritis ([place]) 07/12/2018  . Thrombocytosis ([place]) 07/12/2018  . Goals of care, counseling/discussion 05/25/2018  . Malignant neoplasm of lung ([place]) 05/25/2018  . Recurrent pneumonia  02/21/2018  . PNA (pneumonia) 02/07/2018  . Elevated erythrocyte sedimentation rate 12/16/2017  . Dry cough 08/19/2017  . Rheumatoid nodule of multiple sites ([place]) 08/19/2017  . Polyneuropathy  associated with underlying disease ([place]) 05/17/2017  . Seasonal allergic rhinitis due to pollen 05/17/2017  . Essential hypertension 10/09/2016  . Pure hypercholesterolemia 10/09/2016  . Rheumatoid arthritis, seropositive ([place]) 03/27/2016  . Polyarthralgia 03/18/2016  . Diabetes mellitus type 2, uncomplicated ([place]) 84/13/2440  . Allergic rhinitis 05/18/2014  . Microalbuminuria 04/18/2014     Past Medical History:  Diagnosis Date  . Cancer ([place])    lung ca DX 2019  . Collagen vascular disease (HCC)    RA  . DM2 (diabetes mellitus, type 2) ([place])   . Dyspnea   . Hyperlipemia   . Hypertension   . Osteomyelitis ([place])   . Pneumonia   . RA (rheumatoid arthritis) ([place])   . Rheumatoid arthritis [facility])      Past Surgical History:  Procedure Laterality Date  . ENDOBRONCHIAL ULTRASOUND N/A 05/13/2018   Procedure: ENDOBRONCHIAL ULTRASOUND;  Surgeon: [name], MD;  Location: [facility] ORS;  Service: Pulmonary;  Laterality: N/A;  . PORTA CATH INSERTION N/A 05/27/2018   Procedure: PORTA CATH INSERTION;  Surgeon: Katha Cabal, MD;  Location: [place] [facility];  Service: Cardiovascular;  Laterality: N/A;  . TOE SURGERY      Social History   Socioeconomic History  . Marital status: Single    Spouse name: Not on file  . Number of children: 2  . Years of education: Not on file  . Highest education level: Not on file  Occupational History  . Not on file  Social Needs  . Financial resource strain: Not very hard  . Food insecurity:    Worry: Never true    Inability: Never true  . Transportation needs:    Medical: No    Non-medical: No  Tobacco Use  . Smoking status: Former Smoker    Packs/day: 1.00    Years: 40.00    Pack years: 40.00    Types: Cigarettes    Last attempt to quit: 11/22/2017    Years since quitting: 0.9  . Smokeless tobacco: Never Used  . Tobacco comment: quit 2 weeks ago  Substance and Sexual Activity  . Alcohol use: Never    Frequency: Never   . Drug use: Never  . Sexual activity: Not Currently  Lifestyle  . Physical activity:    Days per week: 0 days    Minutes per session: Not on file  . Stress: To some extent  Relationships  . Social connections:    Talks on phone: Three times a week    Gets together: More than three times a week    Attends religious service: Not on file    Active member of club or organization: No    Attends meetings of clubs or organizations: Never    Relationship status: Never married  . Intimate partner violence:    Fear of current or ex partner: No    Emotionally abused: No    Physically abused: No    Forced sexual activity: No  Other Topics Concern  . Not on file  Social History Narrative   Living at home with daughter.  Ambulates with a walker at baseline.     Family History  Problem Relation Age of Onset  . Diabetes Mother   . Lung cancer Father      Current Facility-Administered Medications:  .  acetaminophen (TYLENOL)  tablet 650 mg, 650 mg, Oral, Q6H PRN, 650 mg at 11/21/18 2028 **OR** acetaminophen (TYLENOL) suppository 650 mg, 650 mg, Rectal, Q6H PRN, Wieting, Richard, MD .  albuterol (PROVENTIL) (2.5 MG/3ML) 0.083% nebulizer solution 3 mL, 3 mL, Inhalation, Q6H, Wieting, Richard, MD, 3 mL at 11/21/18 2033 .  alum & mag hydroxide-simeth (MAALOX/MYLANTA) 200-200-20 MG/5ML suspension 30 mL, 30 mL, Oral, Q6H PRN, Benjie Karvonen, Sital, MD, 30 mL at 11/21/18 1502 .  amLODipine (NORVASC) tablet 5 mg, 5 mg, Oral, Daily, Leslye Peer, Richard, MD, 5 mg at 11/21/18 1103 .  aspirin chewable tablet 81 mg, 81 mg, Oral, Daily, Loletha Grayer, MD, 81 mg at 11/21/18 1103 .  atorvastatin (LIPITOR) tablet 40 mg, 40 mg, Oral, Daily, Leslye Peer, Richard, MD, 40 mg at 11/20/18 2321 .  feeding supplement (ENSURE ENLIVE) (ENSURE ENLIVE) liquid 237 mL, 237 mL, Oral, BID BM, Mody, Sital, MD, 237 mL at 11/21/18 1501 .  ferrous sulfate tablet 325 mg, 325 mg, Oral, BID WC, Wieting, Richard, MD, 325 mg at 11/21/18 1742 .   folic acid (FOLVITE) tablet 1 mg, 1 mg, Oral, Daily, Wieting, Richard, MD, 1 mg at 11/21/18 1103 .  furosemide (LASIX) tablet 20 mg, 20 mg, Oral, Daily, Leslye Peer, Richard, MD, 20 mg at 11/21/18 1103 .  hydroxychloroquine (PLAQUENIL) tablet 200 mg, 200 mg, Oral, BID, Leslye Peer, Richard, MD, 200 mg at 11/21/18 1121 .  insulin aspart (novoLOG) injection 0-5 Units, 0-5 Units, Subcutaneous, QHS, Wieting, Richard, MD .  insulin aspart (novoLOG) injection 0-9 Units, 0-9 Units, Subcutaneous, TID WC, Loletha Grayer, MD, 2 Units at 11/21/18 1740 .  levofloxacin (LEVAQUIN) IVPB 750 mg, 750 mg, Intravenous, Q24H, Wieting, Richard, MD, Last Rate: 100 mL/hr at 11/21/18 2020, 750 mg at 11/21/18 2020 .  lisinopril (PRINIVIL,ZESTRIL) tablet 40 mg, 40 mg, Oral, Daily, Leslye Peer, Richard, MD, 40 mg at 11/21/18 1103 .  metoprolol tartrate (LOPRESSOR) tablet 25 mg, 25 mg, Oral, BID, Leslye Peer, Richard, MD, 25 mg at 11/21/18 1103 .  mirtazapine (REMERON) tablet 7.5 mg, 7.5 mg, Oral, QHS, Wieting, Richard, MD, 7.5 mg at 11/20/18 2322 .  mometasone-formoterol (DULERA) 200-5 MCG/ACT inhaler 2 puff, 2 puff, Inhalation, BID, Wieting, Richard, MD, 2 puff at 11/21/18 2015 .  multivitamin with minerals tablet 1 tablet, 1 tablet, Oral, Daily, Wieting, Richard, MD, 1 tablet at 11/21/18 1103 .  nystatin (MYCOSTATIN) 100000 UNIT/ML suspension 500,000 Units, 5 mL, Oral, QID, Loletha Grayer, MD, 500,000 Units at 11/21/18 1742 .  ondansetron (ZOFRAN) tablet 4 mg, 4 mg, Oral, Q6H PRN **OR** ondansetron (ZOFRAN) injection 4 mg, 4 mg, Intravenous, Q6H PRN, Wieting, Richard, MD .  oxyCODONE (Oxy IR/ROXICODONE) immediate release tablet 10 mg, 10 mg, Oral, Q6H PRN, Loletha Grayer, MD, 10 mg at 11/21/18 2028 .  oxyCODONE (OXYCONTIN) 12 hr tablet 10 mg, 10 mg, Oral, Q12H, Borders, Joshua R, NP, 10 mg at 11/21/18 1507 .  pantoprazole (PROTONIX) EC tablet 40 mg, 40 mg, Oral, Daily, Leslye Peer, Richard, MD, 40 mg at 11/21/18 1103 .  predniSONE  (DELTASONE) tablet 5 mg, 5 mg, Oral, Q breakfast, Leslye Peer, Richard, MD, 5 mg at 11/21/18 0813 .  prochlorperazine (COMPAZINE) tablet 10 mg, 10 mg, Oral, Q6H PRN, Wieting, Richard, MD .  sodium chloride flush (NS) 0.9 % injection 10-40 mL, 10-40 mL, Intracatheter, PRN, Loletha Grayer, MD, 10 mL at 11/21/18 1742 .  sucralfate (CARAFATE) tablet 1 g, 1 g, Oral, TID WC & HS, Wieting, Richard, MD, 1 g at 11/21/18 1742 .  trolamine salicylate (ASPERCREME) 10 % cream, , Topical, BID  PRN, Bettey Costa, MD   Physical exam:  Vitals:   11/21/18 1412 11/21/18 1426 11/21/18 1541 11/21/18 2000  BP:  127/65 (!) 125/97 (!) 145/62  Pulse:  85 86 87  Resp:  (!) 23 20 18   Temp:  97.7 F (36.5 C) 98.1 F (36.7 C) 98.1 F (36.7 C)  TempSrc:  Oral  Oral  SpO2: 100% 99% 98% 100%  Weight:      Height:       Physical Exam  Constitutional: She is oriented to person, place, and time. No distress.  Chronic ill appearance  HENT:  Head: Normocephalic.  Nose: Nose normal.  Eyes: Pupils are equal, round, and reactive to light. EOM are normal. No scleral icterus.  Neck: Normal range of motion. Neck supple.  Cardiovascular: Normal rate and regular rhythm.  No murmur heard. Pulmonary/Chest: Effort normal. No respiratory distress.  Decreased breath sound bilaterally  Abdominal: Soft. Bowel sounds are normal. She exhibits no distension. There is no abdominal tenderness.  Musculoskeletal: Normal range of motion.        General: No edema.  Neurological: She is alert and oriented to person, place, and time. No cranial nerve deficit.  Skin: Skin is warm and dry. She is not diaphoretic.  Psychiatric: Affect normal.        CMP Latest Ref Rng & Units 11/21/2018  Glucose 70 - 99 mg/dL 162(H)  BUN 8 - 23 mg/dL 29(H)  Creatinine 0.44 - 1.00 mg/dL 0.95  Sodium 135 - 145 mmol/L 135  Potassium 3.5 - 5.1 mmol/L 5.1  Chloride 98 - 111 mmol/L 106  CO2 22 - 32 mmol/L 22  Calcium 8.9 - 10.3 mg/dL 8.6(L)  Total Protein  6.5 - 8.1 g/dL -  Total Bilirubin 0.3 - 1.2 mg/dL -  Alkaline Phos 38 - 126 U/L -  AST 15 - 41 U/L -  ALT 0 - 44 U/L -   CBC Latest Ref Rng & Units 11/21/2018  WBC 4.0 - 10.5 K/uL -  Hemoglobin 12.0 - 15.0 g/dL 7.5(L)  Hematocrit 36.0 - 46.0 % 24.8(L)  Platelets 150 - 400 K/uL -   RADIOGRAPHIC STUDIES: I have personally reviewed the radiological images as listed and agreed with the findings in the report. Dg Chest 2 View  Result Date: 11/20/2018 CLINICAL DATA:  Shortness of breath.  Left lower extremity edema. EXAM: CHEST - 2 VIEW COMPARISON:  November 07, 2018 chest x-ray and chest CT FINDINGS: A right Port-A-Cath is stable. Infiltrate in the left lung has worsened in the interval. No other interval changes. IMPRESSION: Worsening infiltrate in the left lung suggesting the possibility of worsening pneumonia. Electronically Signed   By: Dorise Bullion III M.D   On: 11/20/2018 17:32   Dg Chest 2 View  Result Date: 11/07/2018 CLINICAL DATA:  History of lung carcinoma with recent cough EXAM: CHEST - 2 VIEW COMPARISON:  11/02/2018 FINDINGS: Cardiac shadow is stable. Right chest wall port is again seen. Considerable scarring is again noted in the left lung consistent with the given clinical history. No acute infiltrate is identified. No pneumothorax is seen. No acute bony abnormality is noted. IMPRESSION: Extensive scarring in the left hemithorax. No acute infiltrate is noted. Electronically Signed   By: Inez Catalina M.D.   On: 11/07/2018 10:18   Dg Chest 2 View  Result Date: 11/02/2018 CLINICAL DATA:  Nonproductive cough.  Lung cancer. EXAM: CHEST - 2 VIEW COMPARISON:  Chest x-rays dated 10/26/2018 and 05/13/2018 and chest CT dated 10/04/2018 FINDINGS:  Power port in place, unchanged with the tip in the superior vena cava at the level of the carina. There is chronic interstitial disease at the right lung apex and right base. There is chronic pleural thickening in the left hemithorax with chronic  accentuation of the interstitial markings diffusely. Heart size and pulmonary vascularity are normal. Aortic atherosclerosis. No significant bone abnormality. No significant change since the prior exam. IMPRESSION: Stable appearance of the chest since 10/26/2018. Extensive chronic lung disease. Extensive scarring in the left hemithorax as demonstrated on the prior chest x-ray and prior chest CT. Electronically Signed   By: Lorriane Shire M.D.   On: 11/02/2018 13:20   Dg Chest 2 View  Result Date: 10/26/2018 CLINICAL DATA:  Acute shortness of breath and weakness. Patient with lung cancer on chemotherapy. EXAM: CHEST - 2 VIEW COMPARISON:  10/04/2018 chest CT and prior studies FINDINGS: The cardiomediastinal silhouette is unchanged. RIGHT Port-A-Cath again noted with tip overlying the LOWER SVC. LEFT lung opacities and LEFT pleural thickening again noted. No definite acute abnormality noted.  No pneumothorax. No acute bony abnormalities are identified. IMPRESSION: LEFT lung and pleural opacities again noted without definite acute abnormality. Electronically Signed   By: Margarette Canada M.D.   On: 10/26/2018 16:10   Ct Angio Chest Pe W Or Wo Contrast  Result Date: 11/07/2018 CLINICAL DATA:  Thoracic spine pain for 1 week radiating into the left side of the chest. EXAM: CT ANGIOGRAPHY CHEST WITH CONTRAST TECHNIQUE: Multidetector CT imaging of the chest was performed using the standard protocol during bolus administration of intravenous contrast. Multiplanar CT image reconstructions and MIPs were obtained to evaluate the vascular anatomy. CONTRAST:  60 mL ISOVUE-370 IOPAMIDOL (ISOVUE-370) INJECTION 76% COMPARISON:  CT chest 10/04/2018, 05/05/2018 and 02/21/2018. Plain films of the chest 10/26/2018 and earlier today. FINDINGS: Cardiovascular: Heart size is upper normal. No pericardial effusion. Enlarged main pulmonary artery and right and left pulmonary arteries again seen. No pulmonary embolus is identified. Aortic  atherosclerosis is noted. Port-A-Cath remains in place. Mediastinum/Nodes: No enlarged mediastinal, hilar, or axillary lymph nodes. Thyroid gland, trachea, and esophagus demonstrate no significant findings. Lungs/Pleura: Again seen is a cavitary lesion in the left upper lobe measuring approximately 7.9 x 3.7 cm on image 36, unchanged since the most recent exam. Centrilobular emphysema is again identified. There is new airspace disease in the left lung base. Bronchiectasis in the left base is noted. No consolidative process on the right. Upper Abdomen: Negative. Musculoskeletal: No acute or focal abnormality. Review of the MIP images confirms the above findings. IMPRESSION: Negative for pulmonary embolus. Airspace disease in the left lung base which is new since the most recent CT could be due to atelectasis or pneumonia. No change in a partially cavitary left upper lobe mass since the most recent exam. Enlarged pulmonary arteries compatible with pulmonary arterial hypertension, unchanged. Aortic Atherosclerosis (ICD10-I70.0) and Emphysema (ICD10-J43.9). Electronically Signed   By: Inge Rise M.D.   On: 11/07/2018 14:48   US Venous Img Lower Bilateral  Result Date: 11/07/2018 CLINICAL DATA:  Shortness of breath, leg swelling x1 week EXAM: BILATERAL LOWER EXTREMITY VENOUS DOPPLER ULTRASOUND TECHNIQUE: Gray-scale sonography with compression, as well as color and duplex ultrasound, were performed to evaluate the deep venous system from the level of the common femoral vein through the popliteal and proximal calf veins. COMPARISON:  None FINDINGS: Normal compressibility of the common femoral, superficial femoral, and popliteal veins, as well as the proximal calf veins. No filling defects to suggest DVT  on grayscale or color Doppler imaging. Doppler waveforms show normal direction of venous flow, normal respiratory phasicity and response to augmentation. IMPRESSION: No femoropopliteal and no calf DVT in the  visualized calf veins. If clinical symptoms are inconsistent or if there are persistent or worsening symptoms, further imaging (possibly involving the iliac veins) may be warranted. Electronically Signed   By: Lucrezia Europe M.D.   On: 11/07/2018 13:34    Assessment and plan- Patient is a 63 y.o. female with past medical history significant for multiple medical problems, including stage IV lung squamous cell carcinoma, chronic respiratory failure on home oxygen.  Severe rheumatoid arthritis, chronic pain who was admitted to hospital for shortness of breath. Found to have worsening of pneumonia on chest x-ray.  #Pneumonia failed outpatient treatment.  Received aztreonam and vancomycin in ED.  Currently on IV Levaquin. #Acute on chronic anemia, last chemotherapy with carboplatin, Taxol and Keytruda was on 10/12/2018.  Chemotherapy has been held due to hospitalization, upper respiratory symptoms and poor performance status.  Agree with transfuse with 1 unit of irradiated PRBC. Etiology of worsening of anemia unknown.  Differential includes GI bleeding, vitamin deficiency, hemolysis or Myelophthisis. Will check retic panel, smear, vitamin b12 and folate. ldh and haptoglobin.   # Rheumatoid Arthritis: Patient has chronic pain syndrome due to rheumatoid arthritis.  She got 1 dose of immunotherapy in January after I discussed with patient's rheumatologist.  Patient appears to have increased multi-joint pain which can be secondary to immunotherapy toxicities. Outpatient, she takes oxycodone 10 mg every 4-6 hours as needed.  Patient reports taking pain medication around the clock.  Recommend to start long-acting Oxycontin 10mg  Q12h  # Goal of care discussion/CODE STATUS discussion Currently patient is a full code.  I discussed with the patient about DNR/DNI.  Patient tells me that she does not want to be intubated however she wants resuscitated including chest compression and chemical code.  We discussed about  involving palliative care service on board for further evaluation of her symptoms.  Patient has poor performance status, not able to be on anti-neoplasm treatment since February due to 2 hospitalizations.  Plan to continue with immunotherapy only if her rheumatoid arthritis pain can be better controlled.  Second line treatment is available if she is able to tolerate. Patient reports having pain while sitting the bed and requiring nurse/CMA to reposition her.  So we did not finish CODE STATUS discussion. I would recommend palliative care service to be on board for further evaluation and CODE STATUS discussion.   Thank you for allowing me to participate in the care of this patient.  Total face to face encounter time for this patient visit was 70 min. >50% of the time was  spent in counseling and coordination of care.    Earlie Server, MD, PhD Hematology Oncology [facility] Pager- [phone] 11/21/2018

## 2018-11-21 NOTE — Progress Notes (Signed)
Initial Nutrition Assessment  RD working remotely.  DOCUMENTATION CODES:   Obesity unspecified  INTERVENTION:  Recommend liberalizing diet to carbohydrate modified.  Provide Ensure Enlive po BID, each supplement provides 350 kcal and 20 grams of protein.  Encouraged adequate intake of calories and protein at meals.  NUTRITION DIAGNOSIS:   Inadequate oral intake related to decreased appetite as evidenced by per patient/family report, meal completion < 50%.  GOAL:   Patient will meet greater than or equal to 90% of their needs  MONITOR:   PO intake, Supplement acceptance, Labs, Weight trends, I & O's  REASON FOR ASSESSMENT:   Malnutrition Screening Tool    ASSESSMENT:   63 year old female with PMHx of HLD, HTN, osteomyelitis, RA, DM type 2, COPD, stage IV SCC of lung (treatment on hold) admitted with PNA, acute on chronic anemia.   Spoke with patient over the phone to obtain nutrition/weight history. Patient reports she has had a decreased appetite acutely since she developed PNA. She reports she is still eating 3 meals per day but is just eating less at meals. She reports eating about 1/4th of her meals, which is fairly close to the documented 30% in chart. She has been eating soup, chicken, and creamed potatoes. She is amenable to drinking an ONS to help meet calorie/protein needs.  Patient reports her weight goes up and down and it is hard to trend. Per chart she was 83.4 kg on 08/03/2018, 85.7 kg on 09/01/2018, 88.9 kg on 10/12/2018, and is currently 85.5 kg (188.5 lbs).  Meal Completion: 30%  Medications reviewed and include: ferrous sulfate 935 mg BID, folic acid 1 mg daily, Lasix 20 mg daily, Novolog 0-9 units TID, Novolog 0-5 units QHS, lisinopril, Remeron QHS, MVI daily, pantoprazole, prednisone 5 mg daily, Carafate TID and QHS, Levaquin.  Labs reviewed: CBG 38-171, BUN 29.  NUTRITION - FOCUSED PHYSICAL EXAM:  Unable to complete at this time.  Diet Order:   Diet  Order            Diet heart healthy/carb modified Room service appropriate? Yes; Fluid consistency: Thin  Diet effective now             EDUCATION NEEDS:   Education needs have been addressed  Skin:  Skin Assessment: Reviewed RN Assessment  Last BM:  11/20/2018 per chart  Height:   Ht Readings from Last 1 Encounters:  11/20/18 5\' 6"  (1.676 m)   Weight:   Wt Readings from Last 1 Encounters:  11/20/18 85.5 kg   Ideal Body Weight:  59.1 kg  BMI:  Body mass index is 30.42 kg/m.  Estimated Nutritional Needs:   Kcal:  1850-2150  Protein:  95-105 grams  Fluid:  1.8-2.1 L/day  Willey Blade, MS, RD, LDN Office: [phone] Pager: [phone] After Hours/Weekend Pager: [phone]

## 2018-11-22 LAB — CBC
HCT: 25.3 % — ABNORMAL LOW (ref 36.0–46.0)
HEMOGLOBIN: 7.6 g/dL — AB (ref 12.0–15.0)
MCH: 29.9 pg (ref 26.0–34.0)
MCHC: 30 g/dL (ref 30.0–36.0)
MCV: 99.6 fL (ref 80.0–100.0)
Platelets: 372 10*3/uL (ref 150–400)
RBC: 2.54 MIL/uL — ABNORMAL LOW (ref 3.87–5.11)
RDW: 18.2 % — ABNORMAL HIGH (ref 11.5–15.5)
WBC: 9.7 10*3/uL (ref 4.0–10.5)
nRBC: 0 % (ref 0.0–0.2)

## 2018-11-22 LAB — FOLATE: Folate: 26 ng/mL (ref >5.9)

## 2018-11-22 LAB — GLUCOSE, CAPILLARY
Glucose-Capillary: 65 mg/dL — ABNORMAL LOW (ref 70–99)
Glucose-Capillary: 66 mg/dL — ABNORMAL LOW (ref 70–99)
Glucose-Capillary: 93 mg/dL (ref 70–99)
Glucose-Capillary: 171 mg/dL — ABNORMAL HIGH (ref 70–99)

## 2018-11-22 LAB — BPAM RBC
Blood Product Expiration Date: 202004132359
ISSUE DATE / TIME: 202003301135
Unit Type and Rh: 5100

## 2018-11-22 LAB — BASIC METABOLIC PANEL
Anion gap: 5 (ref 5–15)
BUN: 22 mg/dL (ref 8–23)
CO2: 23 mmol/L (ref 22–32)
Calcium: 8.6 mg/dL — ABNORMAL LOW (ref 8.9–10.3)
Chloride: 107 mmol/L (ref 98–111)
Creatinine, Ser: 0.78 mg/dL (ref 0.44–1.00)
GFR calc Af Amer: >60 mL/min (ref >60)
GFR calc non Af Amer: >60 mL/min (ref >60)
Glucose, Bld: 61 mg/dL — ABNORMAL LOW (ref 70–99)
Potassium: 4.9 mmol/L (ref 3.5–5.1)
Sodium: 135 mmol/L (ref 135–145)

## 2018-11-22 LAB — PATHOLOGIST SMEAR REVIEW

## 2018-11-22 LAB — PROCALCITONIN: Procalcitonin: 0.13 ng/mL

## 2018-11-22 LAB — TYPE AND SCREEN
ABO/RH(D): O POS
Antibody Screen: NEGATIVE
Unit division: 0

## 2018-11-22 LAB — VITAMIN B12: Vitamin B-12: 333 pg/mL (ref 180–914)

## 2018-11-22 MED ORDER — ALPRAZOLAM 0.5 MG PO TABS
0.5000 mg | ORAL_TABLET | Freq: Once | ORAL | Status: AC
  Administered 2018-11-22: 0.5 mg via ORAL
  Filled 2018-11-22: qty 1

## 2018-11-22 MED ORDER — BASAGLAR KWIKPEN 100 UNIT/ML ~~LOC~~ SOPN: 10.0000 [IU] | PEN_INJECTOR | Freq: Every morning | SUBCUTANEOUS | 12 refills | Status: DC

## 2018-11-22 MED ORDER — ALBUTEROL SULFATE (2.5 MG/3ML) 0.083% IN NEBU: 2.5000 mg | INHALATION_SOLUTION | Freq: Four times a day (QID) | RESPIRATORY_TRACT | 12 refills | Status: AC | PRN

## 2018-11-22 MED ORDER — HEPARIN SOD (PORK) LOCK FLUSH 100 UNIT/ML IV SOLN
500.0000 [IU] | INTRAVENOUS | Status: AC | PRN
  Administered 2018-11-22: 12:00:00 500 [IU]
  Filled 2018-11-22: qty 5

## 2018-11-22 MED ORDER — LEVOFLOXACIN 750 MG PO TABS: 750.0000 mg | ORAL_TABLET | Freq: Every day | ORAL | 0 refills | Status: DC

## 2018-11-22 MED ORDER — ENSURE ENLIVE PO LIQD: 237.0000 mL | Freq: Two times a day (BID) | ORAL | 12 refills | Status: AC

## 2018-11-22 NOTE — TOC Transition Note (Signed)
Transition of Care [facility]) - CM/SW Discharge Note   Patient Details  Name: Chenita Ruda MRN: [id number] Date of Birth: 1956-03-07  [facility] CM/SW Contact:  Katrina Stack, RN Phone Number: 11/22/2018, 10:37 AM   Clinical Narrative:    Patient for discharge home today.  Says she will require ems transport and her daughter Audelia Acton will be in the home to receive her. Patient is to discharge to different address than what is on her face sheet. [place].  Notified [facility] of discharge and the need to add social work to assist with completion of a medicaid pcs referral.  Orders present for RN PT OT Aide and SW and [facility] is aware.    Final next level of care: [place] Barriers to Discharge: No Barriers Identified   Patient Goals and CMS Choice Patient states their goals for this hospitalization and ongoing recovery are:: i need to breathe better CMS Medicare.gov Compare Post Acute Care list provided to:: (smedicare compaare given to patient and copy placed in record) Choice offered to / list presented to : Patient  Discharge Placement                       Discharge Plan and Services   Discharge Planning Services: CM Consult Post Acute Care Choice: Home Health, Resumption of Svcs/PTA Provider                    Social Determinants of Health (SDOH) Interventions     Readmission Risk Interventions No flowsheet data found.

## 2018-11-22 NOTE — Discharge Summary (Signed)
[place] [facility] [place] NAME: Tamara Parrish    MR#:  [id number]  DATE OF BIRTH:  02/25/1956  DATE OF ADMISSION:  11/20/2018 ADMITTING PHYSICIAN: Loletha Grayer, MD  DATE OF DISCHARGE: 11/22/2018   PRIMARY CARE PHYSICIAN: Glendon Axe, MD    ADMISSION DIAGNOSIS:  Community acquired pneumonia, unspecified laterality [J18.9]  DISCHARGE DIAGNOSIS:  Active Problems:   Pneumonia   Palliative care encounter   SECONDARY DIAGNOSIS:   Past Medical History:  Diagnosis Date  . Cancer ([place])    lung ca DX 2019  . Collagen vascular disease (HCC)    RA  . DM2 (diabetes mellitus, type 2) ([place])   . Dyspnea   . Hyperlipemia   . Hypertension   . Osteomyelitis ([place])   . Pneumonia   . RA (rheumatoid arthritis) ([place])   . Rheumatoid arthritis [facility])     HOSPITAL COURSE:   63 year old female with history of malignant neoplasm of lung chronic hypoxic respiratory failure on 3 L of oxygen, anemia of chronic disease, rheumatoid arthritis and diabetes who presents to the emergency room due to shortness of breath.  1.  Chronic hypoxic respiratory failure with pneumonia: She is on baseline oxygen She will continue Levaquin to complete treatment for pneumonia.  2.  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 Tasia Catchings.  Chemotherapy on hold due to hospitalization and pneumonia.  Patient will have outpatient follow-up with oncology in 1 to 2 weeks.  She also will have outpatient palliative care services.  3.  Acute on chronic anemia: She is status post 1 unit PRBC.  She will have follow-up with oncology regarding her anemia.  She has no evidence of upper or lower GI bleed loss    4.  Rheumatoid arthritis: Continue Plaquenil and prednisone and PRN pain medications  5.  Hyperlipidemia: Continue statin Outpatient palliative care services  DISCHARGE CONDITIONS AND DIET:   Stable for discharge on regular diet  CONSULTS OBTAINED:     DRUG ALLERGIES:   Allergies  Allergen Reactions  . Ibuprofen Other (See Comments)    Is not supposed to take because of her kidneys.  . Penicillins Other (See Comments)    Has patient had a PCN reaction causing immediate rash, facial/tongue/throat swelling, SOB or lightheadedness with hypotension: Unknown Has patient had a PCN reaction causing severe rash involving mucus membranes or skin necrosis: Unknown Has patient had a PCN reaction that required hospitalization: Unknown Has patient had a PCN reaction occurring within the last 10 years: Unknown If all of the above answers are "NO", then may proceed with Cephalosporin use.    DISCHARGE MEDICATIONS:   Allergies as of 11/22/2018      Reactions   Ibuprofen Other (See Comments)   Is not supposed to take because of her kidneys.   Penicillins Other (See Comments)   Has patient had a PCN reaction causing immediate rash, facial/tongue/throat swelling, SOB or lightheadedness with hypotension: Unknown Has patient had a PCN reaction causing severe rash involving mucus membranes or skin necrosis: Unknown Has patient had a PCN reaction that required hospitalization: Unknown Has patient had a PCN reaction occurring within the last 10 years: Unknown If all of the above answers are "NO", then may proceed with Cephalosporin use.      Medication List    STOP taking these medications   lidocaine-prilocaine cream Commonly known as:  EMLA   metFORMIN 1000 MG tablet Commonly known as:  GLUCOPHAGE  TAKE these medications   albuterol 108 (90 Base) MCG/ACT inhaler Commonly known as:  PROVENTIL HFA;VENTOLIN HFA Inhale 2 puffs into the lungs every 6 (six) hours as needed for wheezing or shortness of breath.   ALPRAZolam 0.25 MG tablet Commonly known as:  XANAX Take 0.25 mg by mouth at bedtime as needed for anxiety.   amLODipine 5 MG tablet Commonly known as:  NORVASC Take 5 mg by mouth daily.   aspirin 81 MG chewable tablet Chew 1  tablet by mouth daily.   atorvastatin 40 MG tablet Commonly known as:  LIPITOR Take 40 mg by mouth daily.   Basaglar KwikPen 100 UNIT/ML Sopn Inject 0.1 mLs (10 Units total) into the skin every morning. What changed:  how much to take   budesonide-formoterol 160-4.5 MCG/ACT inhaler Commonly known as:  Symbicort Inhale 2 puffs into the lungs 2 (two) times daily.   dexamethasone 4 MG tablet Commonly known as:  DECADRON Take 2 tablets (8 mg total) by mouth daily. Start the day after chemotherapy for 2 days.   feeding supplement (ENSURE ENLIVE) Liqd Take 237 mLs by mouth 2 (two) times daily between meals.   ferrous sulfate 325 (65 FE) MG EC tablet Take 1 tablet (325 mg total) by mouth 2 (two) times daily with a meal.   folic acid 1 MG tablet Commonly known as:  FOLVITE Take 1 mg by mouth daily.   hydroxychloroquine 200 MG tablet Commonly known as:  PLAQUENIL 1 tab twice a day, 90 days   levofloxacin 750 MG tablet Commonly known as:  Levaquin Take 1 tablet (750 mg total) by mouth daily.   lisinopril 40 MG tablet Commonly known as:  PRINIVIL,ZESTRIL Take 40 mg by mouth daily.   metoprolol tartrate 25 MG tablet Commonly known as:  LOPRESSOR Take 25 mg by mouth 2 (two) times daily.   mirtazapine 7.5 MG tablet Commonly known as:  REMERON Take 1 tablet (7.5 mg total) by mouth at bedtime.   multivitamin with minerals Tabs tablet Take 1 tablet by mouth daily for 30 days.   Narcan 4 MG/0.1ML Liqd nasal spray kit Generic drug:  naloxone CALL 911. ADMINISTER A SINGLE SPRAY OF NARCAN IN ONE NOSTRIL. REPEAT EVERY 3 MINUTES AS NEEDED IF NO OR MINIMAL RESPONSE.   nystatin 100000 UNIT/ML suspension Commonly known as:  MYCOSTATIN Take 5 mLs (500,000 Units total) by mouth 4 (four) times daily.   omeprazole 20 MG capsule Commonly known as:  PRILOSEC Take 1 capsule (20 mg total) by mouth daily.   ondansetron 8 MG tablet Commonly known as:  Zofran Take 1 tablet (8 mg total) by  mouth 2 (two) times daily as needed for refractory nausea / vomiting. Start on day 3 after chemo.   Oxycodone HCl 10 MG Tabs Take 1 tablet (10 mg total) by mouth every 6 (six) hours as needed.   predniSONE 5 MG tablet Commonly known as:  DELTASONE Take 1 tablet (5 mg total) by mouth daily with breakfast.   prochlorperazine 10 MG tablet Commonly known as:  COMPAZINE Take 1 tablet (10 mg total) by mouth every 6 (six) hours as needed (Nausea or vomiting).   sucralfate 1 g tablet Commonly known as:  Carafate Take 1 tablet (1 g total) by mouth 3 (three) times daily for 5 days. Dissolve in 3-4 tbsp warm water, swish and swallow   triamterene-hydrochlorothiazide 37.5-25 MG capsule Commonly known as:  DYAZIDE Take 1 capsule by mouth daily.  Today   CHIEF COMPLAINT:  No acute issues overnight.  Patient does feel anxious this morning   VITAL SIGNS:  Blood pressure 135/60, pulse 94, temperature 97.9 F (36.6 C), resp. rate (!) 23, height '5\' 6"'$  (1.676 m), weight 85.5 kg, SpO2 97 %.   REVIEW OF SYSTEMS:  Review of Systems  Constitutional: Negative.  Negative for chills, fever and malaise/fatigue.  HENT: Negative.  Negative for ear discharge, ear pain, hearing loss, nosebleeds and sore throat.   Eyes: Negative.  Negative for blurred vision and pain.  Respiratory: Negative.  Negative for cough, hemoptysis, shortness of breath and wheezing.   Cardiovascular: Negative.  Negative for chest pain, palpitations and leg swelling.  Gastrointestinal: Negative.  Negative for abdominal pain, blood in stool, diarrhea, nausea and vomiting.  Genitourinary: Negative.  Negative for dysuria.  Musculoskeletal: Negative.  Negative for back pain.  Skin: Negative.   Neurological: Negative for dizziness, tremors, speech change, focal weakness, seizures and headaches.  Endo/Heme/Allergies: Negative.  Does not bruise/bleed easily.  Psychiatric/Behavioral: Negative for depression, hallucinations  and suicidal ideas. The patient is nervous/anxious.      PHYSICAL EXAMINATION:  GENERAL:  63 y.o.-year-old patient lying in the bed with no acute distress.  NECK:  Supple, no jugular venous distention. No thyroid enlargement, no tenderness.  LUNGS: Normal breath sounds bilaterally, no wheezing, rales,rhonchi  No use of accessory muscles of respiration.  CARDIOVASCULAR: S1, S2 normal. No murmurs, rubs, or gallops.  ABDOMEN: Soft, non-tender, non-distended. Bowel sounds present. No organomegaly or mass.  EXTREMITIES: No pedal edema, cyanosis, or clubbing.  PSYCHIATRIC: The patient is alert and oriented x 3.  SKIN: No obvious rash, lesion, or ulcer.   DATA REVIEW:   CBC Recent Labs  Lab 11/22/18 0346  WBC 9.7  HGB 7.6*  HCT 25.3*  PLT 372    Chemistries  Recent Labs  Lab 11/20/18 1948  11/22/18 0346  NA 136   < > 135  K 5.3*   < > 4.9  CL 111   < > 107  CO2 21*   < > 23  GLUCOSE 54*   < > 61*  BUN 35*   < > 22  CREATININE 1.07*   < > 0.78  CALCIUM 8.6*   < > 8.6*  AST 22  --   --   ALT 14  --   --   ALKPHOS 76  --   --   BILITOT 0.3  --   --    < > = values in this interval not displayed.    Cardiac Enzymes Recent Labs  Lab 11/20/18 1948  TROPONINI <0.03    Microbiology Results  '@MICRORSLT48'$ @  RADIOLOGY:  Dg Chest 2 View  Result Date: 11/20/2018 CLINICAL DATA:  Shortness of breath.  Left lower extremity edema. EXAM: CHEST - 2 VIEW COMPARISON:  November 07, 2018 chest x-ray and chest CT FINDINGS: A right Port-A-Cath is stable. Infiltrate in the left lung has worsened in the interval. No other interval changes. IMPRESSION: Worsening infiltrate in the left lung suggesting the possibility of worsening pneumonia. Electronically Signed   By: Dorise Bullion III M.D   On: 11/20/2018 17:32      Allergies as of 11/22/2018      Reactions   Ibuprofen Other (See Comments)   Is not supposed to take because of her kidneys.   Penicillins Other (See Comments)   Has  patient had a PCN reaction causing immediate rash, facial/tongue/throat swelling, SOB or lightheadedness with hypotension:  Unknown Has patient had a PCN reaction causing severe rash involving mucus membranes or skin necrosis: Unknown Has patient had a PCN reaction that required hospitalization: Unknown Has patient had a PCN reaction occurring within the last 10 years: Unknown If all of the above answers are "NO", then may proceed with Cephalosporin use.      Medication List    STOP taking these medications   lidocaine-prilocaine cream Commonly known as:  EMLA   metFORMIN 1000 MG tablet Commonly known as:  GLUCOPHAGE     TAKE these medications   albuterol 108 (90 Base) MCG/ACT inhaler Commonly known as:  PROVENTIL HFA;VENTOLIN HFA Inhale 2 puffs into the lungs every 6 (six) hours as needed for wheezing or shortness of breath.   ALPRAZolam 0.25 MG tablet Commonly known as:  XANAX Take 0.25 mg by mouth at bedtime as needed for anxiety.   amLODipine 5 MG tablet Commonly known as:  NORVASC Take 5 mg by mouth daily.   aspirin 81 MG chewable tablet Chew 1 tablet by mouth daily.   atorvastatin 40 MG tablet Commonly known as:  LIPITOR Take 40 mg by mouth daily.   Basaglar KwikPen 100 UNIT/ML Sopn Inject 0.1 mLs (10 Units total) into the skin every morning. What changed:  how much to take   budesonide-formoterol 160-4.5 MCG/ACT inhaler Commonly known as:  Symbicort Inhale 2 puffs into the lungs 2 (two) times daily.   dexamethasone 4 MG tablet Commonly known as:  DECADRON Take 2 tablets (8 mg total) by mouth daily. Start the day after chemotherapy for 2 days.   feeding supplement (ENSURE ENLIVE) Liqd Take 237 mLs by mouth 2 (two) times daily between meals.   ferrous sulfate 325 (65 FE) MG EC tablet Take 1 tablet (325 mg total) by mouth 2 (two) times daily with a meal.   folic acid 1 MG tablet Commonly known as:  FOLVITE Take 1 mg by mouth daily.   hydroxychloroquine  200 MG tablet Commonly known as:  PLAQUENIL 1 tab twice a day, 90 days   levofloxacin 750 MG tablet Commonly known as:  Levaquin Take 1 tablet (750 mg total) by mouth daily.   lisinopril 40 MG tablet Commonly known as:  PRINIVIL,ZESTRIL Take 40 mg by mouth daily.   metoprolol tartrate 25 MG tablet Commonly known as:  LOPRESSOR Take 25 mg by mouth 2 (two) times daily.   mirtazapine 7.5 MG tablet Commonly known as:  REMERON Take 1 tablet (7.5 mg total) by mouth at bedtime.   multivitamin with minerals Tabs tablet Take 1 tablet by mouth daily for 30 days.   Narcan 4 MG/0.1ML Liqd nasal spray kit Generic drug:  naloxone CALL 911. ADMINISTER A SINGLE SPRAY OF NARCAN IN ONE NOSTRIL. REPEAT EVERY 3 MINUTES AS NEEDED IF NO OR MINIMAL RESPONSE.   nystatin 100000 UNIT/ML suspension Commonly known as:  MYCOSTATIN Take 5 mLs (500,000 Units total) by mouth 4 (four) times daily.   omeprazole 20 MG capsule Commonly known as:  PRILOSEC Take 1 capsule (20 mg total) by mouth daily.   ondansetron 8 MG tablet Commonly known as:  Zofran Take 1 tablet (8 mg total) by mouth 2 (two) times daily as needed for refractory nausea / vomiting. Start on day 3 after chemo.   Oxycodone HCl 10 MG Tabs Take 1 tablet (10 mg total) by mouth every 6 (six) hours as needed.   predniSONE 5 MG tablet Commonly known as:  DELTASONE Take 1 tablet (5 mg total) by mouth  daily with breakfast.   prochlorperazine 10 MG tablet Commonly known as:  COMPAZINE Take 1 tablet (10 mg total) by mouth every 6 (six) hours as needed (Nausea or vomiting).   sucralfate 1 g tablet Commonly known as:  Carafate Take 1 tablet (1 g total) by mouth 3 (three) times daily for 5 days. Dissolve in 3-4 tbsp warm water, swish and swallow   triamterene-hydrochlorothiazide 37.5-25 MG capsule Commonly known as:  DYAZIDE Take 1 capsule by mouth daily.         Management plans discussed with the patient and she is in  agreement. Stable for discharge home  Patient should follow up with oncology  CODE STATUS:     Code Status Orders  (From admission, onward)         Start     Ordered   11/20/18 2045  Full code  Continuous     11/20/18 2045        Code Status History    Date Active Date Inactive Code Status Order ID Comments User Context   10/26/2018 2037 10/28/2018 2050 Partial Code [id number]  Hillary Bow, MD Inpatient   10/26/2018 1728 10/26/2018 2037 DNR [id number]  Hillary Bow, MD ED   02/21/2018 2004 02/24/2018 1542 Full Code [id number]  Gladstone Lighter, MD ED   02/07/2018 2202 02/11/2018 1738 Full Code [id number]  Dustin Flock, MD Inpatient      TOTAL TIME TAKING CARE OF THIS PATIENT: 38 minutes.    Note: This dictation was prepared with Dragon dictation along with smaller phrase technology. Any transcriptional errors that result from this process are unintentional.  Bettey Costa M.D on 11/22/2018 at 10:44 AM  Between 7am to 6pm - Pager - [phone] After 6pm go to [email or web address] - password [email or web address] [facility]  Office  [phone]  CC: Primary care physician; Glendon Axe, MD

## 2018-11-22 NOTE — Progress Notes (Signed)
Pt is being discharged home. Discharge papers given and explained to pt. Pt verbalized understanding. Meds and f/u appointments reviewed. Awaiting EMS.

## 2018-11-22 NOTE — Progress Notes (Signed)
Hypoglycemic Event  CBG: 65  Treatment: 4oz OJ  Symptoms: none  Follow-up CBG: Time: 0803 CBG Result: 93  Possible Reasons for Event: Poor appetite  Comments/MD notified:  At shift change noted that glucose level 61 on BMP from 0346.  Pt asymptomatic, 4oz OJ given. CBG checked and was 65.  Additional 4oz given, CBG up to 93.  Pt has poor appetite and encouraged to eat. Educated pt about the risks of hypoglycemia and prevention. Pt verbalized understanding. Notified Dr Benjie Karvonen of the above. No new orders.    Tamara Parrish

## 2018-11-23 ENCOUNTER — Other Ambulatory Visit: Payer: Self-pay | Admitting: Hospice and Palliative Medicine

## 2018-11-23 ENCOUNTER — Telehealth: Payer: Self-pay | Admitting: *Deleted

## 2018-11-23 LAB — HAPTOGLOBIN: Haptoglobin: 344 mg/dL (ref 37–355)

## 2018-11-23 MED ORDER — OXYCODONE HCL 10 MG PO TABS: 10.0000 mg | ORAL_TABLET | Freq: Four times a day (QID) | ORAL | 0 refills | Status: DC | PRN

## 2018-11-23 MED ORDER — OXYCODONE HCL ER 10 MG PO T12A: 10.0000 mg | EXTENDED_RELEASE_TABLET | Freq: Two times a day (BID) | ORAL | 0 refills | Status: DC

## 2018-11-23 NOTE — Progress Notes (Signed)
I spoke with patient's daughter, Theadora Rama, by phone. She says that patient is having persistent pain following discharge from the hospital. I had started her on OxyContin in the hospital but it does not appear that patient was sent home on this. Will send Rx for OxyContin and refill oxycodone for BTP. Plan to see patient in clinic on 4/7 and can make further adjustments as needed at that time.   Case and plan discussed with Dr. Tasia Catchings.   [place] reviewed.   Plan: -OxyContin 10mg  Q12H (rx #14) -Continue oxycodone 10mg  Q6H prn for BTP (rx #30) -Prophylactic bowel regimen RTC on 4/7

## 2018-11-23 NOTE — Telephone Encounter (Signed)
Daughter called stating that the ER doc did not give patient any pain medicine to go home with and she would like some.  IMPRESSION AND PLAN:   1.  Pneumonia.  ER physician ordered as treating him and vancomycin.  I will switch antibiotics over to Levaquin.  Since symptoms have been going on for a while,  I do not think this is covid-19. 2.  Stage IV squamous cell carcinoma of the lung.  Patient is a full code.  Consulted Dr. Tasia Catchings oncology to see the patient tomorrow. 3.  Type 2 diabetes mellitus.  Continue glargine insulin and sliding scale 4, hypertension continue Norvasc, lisinopril.  Switch Dyazide over the Lasix because she has lower extremity swelling. 5.  Rheumatoid arthritis on Plaquenil and chronic prednisone 6.  Hyperlipidemia unspecified on Lipitor 7.  Chronic pain on oxycodone  All the records are reviewed and case discussed with ED provider. Management plans discussed with the patient, family and they are in agreement.  CODE STATUS: full code  TOTAL TIME TAKING CARE OF THIS PATIENT: 50 minutes.    Loletha Grayer M.D on 11/20/2018 at 9:38 PM  Between 7am to 6pm - Pager - [phone]  After 6pm call admission pager [phone]  [facility] Office  [phone]  CC: Primary care physician; Glendon Axe, MD           Electronically signed by Loletha Grayer, MD at 11/20/2018 9:46 PM

## 2018-11-23 NOTE — Telephone Encounter (Signed)
Per chart/phone note, Billey Chang, NP spoke to patient's daughter Theadora Rama and he will send in pain medication.

## 2018-11-24 ENCOUNTER — Telehealth: Payer: Self-pay | Admitting: Internal Medicine

## 2018-11-24 NOTE — Telephone Encounter (Signed)
Spoke to pt's daughter, Theadora Rama ([place]) and requested that 12/06/18 OV be moved to week of 11/28/18. Pt has been scheduled for phone visit on 11/30/18. Theadora Rama has requested that our office contact her first, she will then contact pt to call us for the visit.  Appt note has been updated. Nothing further is needed at this time.

## 2018-11-25 LAB — CULTURE, BLOOD (ROUTINE X 2)
Culture: NO GROWTH
Culture: NO GROWTH

## 2018-11-28 ENCOUNTER — Other Ambulatory Visit: Payer: Self-pay

## 2018-11-28 ENCOUNTER — Telehealth: Payer: Self-pay | Admitting: *Deleted

## 2018-11-28 NOTE — Telephone Encounter (Signed)
Patient daughter Tamara Parrish) called and stated that her mother has been exposed to [place]... 11/29/18 appt has been CANCELLED.

## 2018-11-29 ENCOUNTER — Telehealth: Payer: Self-pay | Admitting: *Deleted

## 2018-11-29 ENCOUNTER — Emergency Department: Payer: Medicaid Other

## 2018-11-29 ENCOUNTER — Inpatient Hospital Stay: Payer: Medicaid Other | Admitting: Oncology

## 2018-11-29 ENCOUNTER — Encounter: Payer: Self-pay | Admitting: Emergency Medicine

## 2018-11-29 ENCOUNTER — Other Ambulatory Visit: Payer: Self-pay

## 2018-11-29 ENCOUNTER — Inpatient Hospital Stay: Payer: Medicaid Other | Admitting: Hospice and Palliative Medicine

## 2018-11-29 ENCOUNTER — Encounter: Payer: Self-pay | Admitting: Oncology

## 2018-11-29 ENCOUNTER — Inpatient Hospital Stay
Admission: EM | Admit: 2018-11-29 | Discharge: 2018-12-04 | DRG: 871 | Disposition: A | Discharge status: Home Health | Payer: Medicaid Other | Attending: Internal Medicine | Admitting: Internal Medicine

## 2018-11-29 ENCOUNTER — Inpatient Hospital Stay: Payer: Medicaid Other

## 2018-11-29 DIAGNOSIS — D638 Anemia in other chronic diseases classified elsewhere: Secondary | ICD-10-CM | POA: Diagnosis present

## 2018-11-29 DIAGNOSIS — Z880 Allergy status to penicillin: Secondary | ICD-10-CM | POA: Diagnosis not present

## 2018-11-29 DIAGNOSIS — R6889 Other general symptoms and signs: Secondary | ICD-10-CM

## 2018-11-29 DIAGNOSIS — J189 Pneumonia, unspecified organism: Secondary | ICD-10-CM | POA: Diagnosis present

## 2018-11-29 DIAGNOSIS — Z801 Family history of malignant neoplasm of trachea, bronchus and lung: Secondary | ICD-10-CM | POA: Diagnosis not present

## 2018-11-29 DIAGNOSIS — J9621 Acute and chronic respiratory failure with hypoxia: Secondary | ICD-10-CM | POA: Diagnosis present

## 2018-11-29 DIAGNOSIS — Z20828 Contact with and (suspected) exposure to other viral communicable diseases: Secondary | ICD-10-CM | POA: Diagnosis present

## 2018-11-29 DIAGNOSIS — I10 Essential (primary) hypertension: Secondary | ICD-10-CM | POA: Diagnosis present

## 2018-11-29 DIAGNOSIS — Z87891 Personal history of nicotine dependence: Secondary | ICD-10-CM

## 2018-11-29 DIAGNOSIS — A419 Sepsis, unspecified organism: Secondary | ICD-10-CM | POA: Diagnosis present

## 2018-11-29 DIAGNOSIS — R0902 Hypoxemia: Secondary | ICD-10-CM | POA: Diagnosis present

## 2018-11-29 DIAGNOSIS — J301 Allergic rhinitis due to pollen: Secondary | ICD-10-CM | POA: Diagnosis present

## 2018-11-29 DIAGNOSIS — F419 Anxiety disorder, unspecified: Secondary | ICD-10-CM

## 2018-11-29 DIAGNOSIS — Z792 Long term (current) use of antibiotics: Secondary | ICD-10-CM

## 2018-11-29 DIAGNOSIS — M069 Rheumatoid arthritis, unspecified: Secondary | ICD-10-CM | POA: Diagnosis present

## 2018-11-29 DIAGNOSIS — Z9221 Personal history of antineoplastic chemotherapy: Secondary | ICD-10-CM | POA: Diagnosis not present

## 2018-11-29 DIAGNOSIS — Z794 Long term (current) use of insulin: Secondary | ICD-10-CM | POA: Diagnosis not present

## 2018-11-29 DIAGNOSIS — Z7982 Long term (current) use of aspirin: Secondary | ICD-10-CM

## 2018-11-29 DIAGNOSIS — Z7952 Long term (current) use of systemic steroids: Secondary | ICD-10-CM | POA: Diagnosis not present

## 2018-11-29 DIAGNOSIS — Z8701 Personal history of pneumonia (recurrent): Secondary | ICD-10-CM

## 2018-11-29 DIAGNOSIS — Z833 Family history of diabetes mellitus: Secondary | ICD-10-CM

## 2018-11-29 DIAGNOSIS — E785 Hyperlipidemia, unspecified: Secondary | ICD-10-CM | POA: Diagnosis present

## 2018-11-29 DIAGNOSIS — Z79899 Other long term (current) drug therapy: Secondary | ICD-10-CM | POA: Diagnosis not present

## 2018-11-29 DIAGNOSIS — Z7951 Long term (current) use of inhaled steroids: Secondary | ICD-10-CM

## 2018-11-29 DIAGNOSIS — R509 Fever, unspecified: Secondary | ICD-10-CM

## 2018-11-29 DIAGNOSIS — R0602 Shortness of breath: Secondary | ICD-10-CM | POA: Diagnosis not present

## 2018-11-29 DIAGNOSIS — F1190 Opioid use, unspecified, uncomplicated: Secondary | ICD-10-CM

## 2018-11-29 DIAGNOSIS — C3490 Malignant neoplasm of unspecified part of unspecified bronchus or lung: Secondary | ICD-10-CM

## 2018-11-29 DIAGNOSIS — E119 Type 2 diabetes mellitus without complications: Secondary | ICD-10-CM | POA: Diagnosis present

## 2018-11-29 DIAGNOSIS — Z886 Allergy status to analgesic agent status: Secondary | ICD-10-CM | POA: Diagnosis not present

## 2018-11-29 DIAGNOSIS — C3412 Malignant neoplasm of upper lobe, left bronchus or lung: Secondary | ICD-10-CM | POA: Diagnosis present

## 2018-11-29 DIAGNOSIS — Y95 Nosocomial condition: Secondary | ICD-10-CM | POA: Diagnosis present

## 2018-11-29 DIAGNOSIS — Z20822 Contact with and (suspected) exposure to covid-19: Secondary | ICD-10-CM | POA: Diagnosis present

## 2018-11-29 LAB — CBC WITH DIFFERENTIAL/PLATELET
Abs Immature Granulocytes: 0.16 10*3/uL — ABNORMAL HIGH (ref 0.00–0.07)
Basophils Absolute: 0.1 10*3/uL (ref 0.0–0.1)
Basophils Relative: 0 %
Eosinophils Absolute: 0.1 10*3/uL (ref 0.0–0.5)
Eosinophils Relative: 1 %
HCT: 28 % — ABNORMAL LOW (ref 36.0–46.0)
Hemoglobin: 8.3 g/dL — ABNORMAL LOW (ref 12.0–15.0)
Immature Granulocytes: 1 %
Lymphocytes Relative: 4 %
Lymphs Abs: 0.5 10*3/uL — ABNORMAL LOW (ref 0.7–4.0)
MCH: 29 pg (ref 26.0–34.0)
MCHC: 29.6 g/dL — ABNORMAL LOW (ref 30.0–36.0)
MCV: 97.9 fL (ref 80.0–100.0)
Monocytes Absolute: 0.9 10*3/uL (ref 0.1–1.0)
Monocytes Relative: 6 %
Neutro Abs: 12.4 10*3/uL — ABNORMAL HIGH (ref 1.7–7.7)
Neutrophils Relative %: 88 %
Platelets: 437 10*3/uL — ABNORMAL HIGH (ref 150–400)
RBC: 2.86 MIL/uL — ABNORMAL LOW (ref 3.87–5.11)
RDW: 16 % — ABNORMAL HIGH (ref 11.5–15.5)
WBC: 14.1 10*3/uL — ABNORMAL HIGH (ref 4.0–10.5)
nRBC: 0 % (ref 0.0–0.2)

## 2018-11-29 LAB — COMPREHENSIVE METABOLIC PANEL
ALT: 13 U/L (ref 0–44)
AST: 19 U/L (ref 15–41)
Albumin: 2.2 g/dL — ABNORMAL LOW (ref 3.5–5.0)
Alkaline Phosphatase: 74 U/L (ref 38–126)
Anion gap: 6 (ref 5–15)
BUN: 18 mg/dL (ref 8–23)
CO2: 25 mmol/L (ref 22–32)
Calcium: 9.5 mg/dL (ref 8.9–10.3)
Chloride: 105 mmol/L (ref 98–111)
Creatinine, Ser: 0.81 mg/dL (ref 0.44–1.00)
GFR calc Af Amer: >60 mL/min (ref >60)
GFR calc non Af Amer: >60 mL/min (ref >60)
Glucose, Bld: 108 mg/dL — ABNORMAL HIGH (ref 70–99)
Potassium: 5.2 mmol/L — ABNORMAL HIGH (ref 3.5–5.1)
Sodium: 136 mmol/L (ref 135–145)
Total Bilirubin: 0.3 mg/dL (ref 0.3–1.2)
Total Protein: 8.1 g/dL (ref 6.5–8.1)

## 2018-11-29 LAB — BLOOD GAS, VENOUS
Acid-Base Excess: 2.2 mmol/L — ABNORMAL HIGH (ref 0.0–2.0)
Bicarbonate: 27.8 mmol/L (ref 20.0–28.0)
O2 Saturation: 90.7 %
Patient temperature: 37
pCO2, Ven: 46 mmHg (ref 44.0–60.0)
pH, Ven: 7.39 (ref 7.250–7.430)
pO2, Ven: 61 mmHg — ABNORMAL HIGH (ref 32.0–45.0)

## 2018-11-29 LAB — GLUCOSE, CAPILLARY: Glucose-Capillary: 65 mg/dL — ABNORMAL LOW (ref 70–99)

## 2018-11-29 LAB — BRAIN NATRIURETIC PEPTIDE: B Natriuretic Peptide: 142 pg/mL — ABNORMAL HIGH (ref 0.0–100.0)

## 2018-11-29 LAB — MRSA PCR SCREENING: MRSA by PCR: NEGATIVE

## 2018-11-29 LAB — LACTIC ACID, PLASMA: Lactic Acid, Venous: 1.1 mmol/L (ref 0.5–1.9)

## 2018-11-29 LAB — INFLUENZA PANEL BY PCR (TYPE A & B)
Influenza A By PCR: NEGATIVE
Influenza B By PCR: NEGATIVE

## 2018-11-29 LAB — PROCALCITONIN: Procalcitonin: 0.24 ng/mL

## 2018-11-29 LAB — TROPONIN I: Troponin I: <0.03 ng/mL (ref <0.03)

## 2018-11-29 MED ORDER — SODIUM CHLORIDE 0.9 % IV SOLN
2.0000 g | Freq: Once | INTRAVENOUS | Status: AC
  Administered 2018-11-29: 2 g via INTRAVENOUS
  Filled 2018-11-29 (×2): qty 2

## 2018-11-29 MED ORDER — LORAZEPAM 2 MG/ML IJ SOLN
0.5000 mg | Freq: Once | INTRAMUSCULAR | Status: AC
  Administered 2018-11-29: 16:00:00 0.5 mg via INTRAVENOUS
  Filled 2018-11-29: qty 1

## 2018-11-29 MED ORDER — MOMETASONE FURO-FORMOTEROL FUM 200-5 MCG/ACT IN AERO
2.0000 | INHALATION_SPRAY | Freq: Two times a day (BID) | RESPIRATORY_TRACT | Status: DC
  Administered 2018-11-29 – 2018-12-04 (×10): 2 via RESPIRATORY_TRACT
  Filled 2018-11-29: qty 8.8

## 2018-11-29 MED ORDER — IPRATROPIUM-ALBUTEROL 20-100 MCG/ACT IN AERS
1.0000 | INHALATION_SPRAY | Freq: Four times a day (QID) | RESPIRATORY_TRACT | Status: DC | PRN
  Administered 2018-11-30 – 2018-12-04 (×11): 1 via RESPIRATORY_TRACT
  Filled 2018-11-29: qty 4

## 2018-11-29 MED ORDER — OXYCODONE HCL 5 MG PO TABS
10.0000 mg | ORAL_TABLET | Freq: Four times a day (QID) | ORAL | Status: DC | PRN
  Administered 2018-11-29 – 2018-12-04 (×14): 10 mg via ORAL
  Filled 2018-11-29 (×14): qty 2

## 2018-11-29 MED ORDER — ONDANSETRON HCL 4 MG/2ML IJ SOLN
4.0000 mg | Freq: Four times a day (QID) | INTRAMUSCULAR | Status: DC | PRN
  Administered 2018-12-01: 4 mg via INTRAVENOUS
  Filled 2018-11-29: qty 2

## 2018-11-29 MED ORDER — BENZONATATE 100 MG PO CAPS
200.0000 mg | ORAL_CAPSULE | Freq: Three times a day (TID) | ORAL | Status: DC | PRN
  Administered 2018-11-29 – 2018-12-02 (×4): 200 mg via ORAL
  Filled 2018-11-29 (×4): qty 2

## 2018-11-29 MED ORDER — GUAIFENESIN-DM 100-10 MG/5ML PO SYRP
5.0000 mL | ORAL_SOLUTION | ORAL | Status: DC | PRN
  Administered 2018-11-29 – 2018-12-03 (×4): 5 mL via ORAL
  Filled 2018-11-29 (×4): qty 5

## 2018-11-29 MED ORDER — IOHEXOL 300 MG/ML  SOLN
75.0000 mL | Freq: Once | INTRAMUSCULAR | Status: AC | PRN
  Administered 2018-11-29: 18:00:00 75 mL via INTRAVENOUS
  Filled 2018-11-29: qty 75

## 2018-11-29 MED ORDER — ACETAMINOPHEN 325 MG PO TABS: 650.0000 mg | ORAL_TABLET | Freq: Four times a day (QID) | ORAL | Status: DC | PRN

## 2018-11-29 MED ORDER — SODIUM CHLORIDE 0.9 % IV SOLN
2.0000 g | Freq: Three times a day (TID) | INTRAVENOUS | Status: DC
  Administered 2018-11-29 – 2018-11-30 (×4): 2 g via INTRAVENOUS
  Filled 2018-11-29 (×7): qty 2

## 2018-11-29 MED ORDER — ACETAMINOPHEN 650 MG RE SUPP: 650.0000 mg | Freq: Four times a day (QID) | RECTAL | Status: DC | PRN

## 2018-11-29 MED ORDER — PANTOPRAZOLE SODIUM 40 MG PO TBEC
40.0000 mg | DELAYED_RELEASE_TABLET | Freq: Every day | ORAL | Status: DC
  Administered 2018-11-30 – 2018-12-04 (×5): 40 mg via ORAL
  Filled 2018-11-29 (×5): qty 1

## 2018-11-29 MED ORDER — SODIUM CHLORIDE 0.9 % IV SOLN
2.0000 g | Freq: Three times a day (TID) | INTRAVENOUS | Status: DC
  Filled 2018-11-29 (×3): qty 2

## 2018-11-29 MED ORDER — VANCOMYCIN HCL 10 G IV SOLR
2000.0000 mg | Freq: Once | INTRAVENOUS | Status: AC
  Administered 2018-11-29: 2000 mg via INTRAVENOUS
  Filled 2018-11-29: qty 2000

## 2018-11-29 MED ORDER — ONDANSETRON HCL 4 MG PO TABS: 4.0000 mg | ORAL_TABLET | Freq: Four times a day (QID) | ORAL | Status: DC | PRN

## 2018-11-29 MED ORDER — ENOXAPARIN SODIUM 40 MG/0.4ML ~~LOC~~ SOLN
40.0000 mg | SUBCUTANEOUS | Status: DC
  Administered 2018-11-29 – 2018-12-03 (×5): 40 mg via SUBCUTANEOUS
  Filled 2018-11-29 (×5): qty 0.4

## 2018-11-29 MED ORDER — VANCOMYCIN HCL 10 G IV SOLR
1250.0000 mg | INTRAVENOUS | Status: DC
  Administered 2018-11-30: 1250 mg via INTRAVENOUS
  Filled 2018-11-29 (×2): qty 1250

## 2018-11-29 MED ORDER — INSULIN ASPART 100 UNIT/ML ~~LOC~~ SOLN
0.0000 [IU] | Freq: Three times a day (TID) | SUBCUTANEOUS | Status: DC
  Administered 2018-12-02: 17:00:00 1 [IU] via SUBCUTANEOUS
  Administered 2018-12-02: 3 [IU] via SUBCUTANEOUS
  Administered 2018-12-03: 1 [IU] via SUBCUTANEOUS
  Administered 2018-12-03: 17:00:00 2 [IU] via SUBCUTANEOUS
  Administered 2018-12-04: 1 [IU] via SUBCUTANEOUS
  Filled 2018-11-29 (×5): qty 1

## 2018-11-29 MED ORDER — ALPRAZOLAM 0.25 MG PO TABS
0.2500 mg | ORAL_TABLET | Freq: Every evening | ORAL | Status: DC | PRN
  Administered 2018-11-29 – 2018-12-03 (×5): 0.25 mg via ORAL
  Filled 2018-11-29 (×5): qty 1

## 2018-11-29 MED ORDER — ASPIRIN 81 MG PO CHEW
81.0000 mg | CHEWABLE_TABLET | Freq: Every day | ORAL | Status: DC
  Administered 2018-11-30 – 2018-12-04 (×5): 81 mg via ORAL
  Filled 2018-11-29 (×5): qty 1

## 2018-11-29 NOTE — ED Notes (Signed)
Pt returned from CT °

## 2018-11-29 NOTE — Progress Notes (Signed)
CODE SEPSIS - PHARMACY COMMUNICATION  **Broad Spectrum Antibiotics should be administered within 1 hour of Sepsis diagnosis**  Time Code Sepsis Called/Page Received: 15:28  Antibiotics Ordered: Azactam and Vancomycin  Time of 1st antibiotic administration: 16:10   Shaniquia Brafford ,PharmD Clinical Pharmacist  11/29/2018  4:14 PM

## 2018-11-29 NOTE — ED Notes (Signed)
ED TO INPATIENT HANDOFF REPORT  ED Nurse Name and Phone #:   S Name/Age/Gender Tamara Parrish 63 y.o. female Room/Bed: ED33A/ED33A  Code Status   Code Status: Prior  Home/SNF/Other Home Patient oriented to: self, place, time and situation Is this baseline? Yes   Triage Complete: Triage complete  Chief Complaint SOB  Triage Note PT to ER via EMS from home.  Pt was admitted to hospital last week and had an increase in her oxygen at that time.  Pt has no new symptoms and no increase in her oxygen demand.  Pt has returned today because she was notified that an nurse who took care of her on her last visit tested positive for Covid-19.     Allergies Allergies  Allergen Reactions  . Ibuprofen Other (See Comments)    Is not supposed to take because of her kidneys.  . Penicillins Other (See Comments)    Has patient had a PCN reaction causing immediate rash, facial/tongue/throat swelling, SOB or lightheadedness with hypotension: Unknown Has patient had a PCN reaction causing severe rash involving mucus membranes or skin necrosis: Unknown Has patient had a PCN reaction that required hospitalization: Unknown Has patient had a PCN reaction occurring within the last 10 years: Unknown If all of the above answers are "NO", then may proceed with Cephalosporin use.    Level of Care/Admitting Diagnosis ED Disposition    ED Disposition Condition [facility] Area: [place] [[id number]]  Level of Care: Med-Surg [16]  Diagnosis: Sepsis [facility]) [[id number]]  Admitting Physician: Lance Coon [[id number]]  Attending Physician: Lance Coon [phone]  Estimated length of stay: past midnight tomorrow  Certification:: I certify this patient will need inpatient services for at least 2 midnights  Bed request comments: suspected COVID-19, low risk, droplet and contact precautions  PT Class (Do Not Modify): Inpatient [101]  PT Acc Code (Do Not Modify): Private  [1]       B Medical/Surgery History Past Medical History:  Diagnosis Date  . Cancer ([place])    lung ca DX 2019  . Collagen vascular disease ([facility])    RA  . DM2 (diabetes mellitus, type 2) ([place])   . Dyspnea   . Hyperlipemia   . Hypertension   . Osteomyelitis ([place])   . Pneumonia   . RA (rheumatoid arthritis) ([place])   . Rheumatoid arthritis [facility])    Past Surgical History:  Procedure Laterality Date  . ENDOBRONCHIAL ULTRASOUND N/A 05/13/2018   Procedure: ENDOBRONCHIAL ULTRASOUND;  Surgeon: [name], MD;  Location: [facility] ORS;  Service: Pulmonary;  Laterality: N/A;  . PORTA CATH INSERTION N/A 05/27/2018   Procedure: PORTA CATH INSERTION;  Surgeon: Katha Cabal, MD;  Location: [facility];  Service: Cardiovascular;  Laterality: N/A;  . TOE SURGERY       A IV Location/Drains/Wounds Patient Lines/Drains/Airways Status   Active Line/Drains/Airways    Name:   Placement date:   Placement time:   Site:   Days:   Implanted Port 05/27/18 Right Chest   05/27/18    1431    Chest   186   Peripheral IV 11/29/18 Right Antecubital   11/29/18    1608    Antecubital   less than 1   External Urinary Catheter   10/27/18    1622    -   33   External Urinary Catheter   11/20/18    2000    -   9  Intake/Output Last 24 hours No intake or output data in the 24 hours ending 11/29/18 2058  Labs/Imaging Results for orders placed or performed during the hospital encounter of 11/29/18 (from the past 48 hour(s))  Lactic acid, plasma     Status: None   Collection Time: 11/29/18  3:59 PM  Result Value Ref Range   Lactic Acid, Venous 1.1 0.5 - 1.9 mmol/L    Comment: Performed at [facility], [address] [place], [place] [address]  Comprehensive metabolic panel     Status: Abnormal   Collection Time: 11/29/18  3:59 PM  Result Value Ref Range   Sodium 136 135 - 145 mmol/L   Potassium 5.2 (H) 3.5 - 5.1 mmol/L   Chloride 105 98 - 111 mmol/L   CO2 25 22 -  32 mmol/L   Glucose, Bld 108 (H) 70 - 99 mg/dL   BUN 18 8 - 23 mg/dL   Creatinine, Ser 0.81 0.44 - 1.00 mg/dL   Calcium 9.5 8.9 - 10.3 mg/dL   Total Protein 8.1 6.5 - 8.1 g/dL   Albumin 2.2 (L) 3.5 - 5.0 g/dL   AST 19 15 - 41 U/L   ALT 13 0 - 44 U/L   Alkaline Phosphatase 74 38 - 126 U/L   Total Bilirubin 0.3 0.3 - 1.2 mg/dL   GFR calc non Af Amer >60 >60 mL/min   GFR calc Af Amer >60 >60 mL/min   Anion gap 6 5 - 15    Comment: Performed at [facility], [address] [place], [place] [address]  CBC WITH DIFFERENTIAL     Status: Abnormal   Collection Time: 11/29/18  3:59 PM  Result Value Ref Range   WBC 14.1 (H) 4.0 - 10.5 K/uL   RBC 2.86 (L) 3.87 - 5.11 MIL/uL   Hemoglobin 8.3 (L) 12.0 - 15.0 g/dL   HCT 28.0 (L) 36.0 - 46.0 %   MCV 97.9 80.0 - 100.0 fL   MCH 29.0 26.0 - 34.0 pg   MCHC 29.6 (L) 30.0 - 36.0 g/dL   RDW 16.0 (H) 11.5 - 15.5 %   Platelets 437 (H) 150 - 400 K/uL   nRBC 0.0 0.0 - 0.2 %   Neutrophils Relative % 88 %   Neutro Abs 12.4 (H) 1.7 - 7.7 K/uL   Lymphocytes Relative 4 %   Lymphs Abs 0.5 (L) 0.7 - 4.0 K/uL   Monocytes Relative 6 %   Monocytes Absolute 0.9 0.1 - 1.0 K/uL   Eosinophils Relative 1 %   Eosinophils Absolute 0.1 0.0 - 0.5 K/uL   Basophils Relative 0 %   Basophils Absolute 0.1 0.0 - 0.1 K/uL   Immature Granulocytes 1 %   Abs Immature Granulocytes 0.16 (H) 0.00 - 0.07 K/uL    Comment: Performed at [facility], [address] [place], [place] [address]  Troponin I - ONCE - STAT     Status: None   Collection Time: 11/29/18  3:59 PM  Result Value Ref Range   Troponin I <0.03 <0.03 ng/mL    Comment: Performed at [facility], [address] [place], [place] [address]  Influenza panel by PCR (type A & B)     Status: None   Collection Time: 11/29/18  3:59 PM  Result Value Ref Range   Influenza A By PCR NEGATIVE NEGATIVE   Influenza B By PCR NEGATIVE NEGATIVE    Comment: (NOTE) The Xpert Xpress Flu assay is  intended as an  aid in the diagnosis of  influenza and should not be used as a sole basis for treatment.  This  assay is FDA approved for nasopharyngeal swab specimens only. Nasal  washings and aspirates are unacceptable for Xpert Xpress Flu testing. Performed at [facility], [address] [place], [place] [address]   Brain natriuretic peptide     Status: Abnormal   Collection Time: 11/29/18  3:59 PM  Result Value Ref Range   B Natriuretic Peptide 142.0 (H) 0.0 - 100.0 pg/mL    Comment: Performed at [facility], [address] [place], [place] [address]  Procalcitonin     Status: None   Collection Time: 11/29/18  3:59 PM  Result Value Ref Range   Procalcitonin 0.24 ng/mL    Comment:        Interpretation: PCT (Procalcitonin) <= 0.5 ng/mL: Systemic infection (sepsis) is not likely. Local bacterial infection is possible. (NOTE)       Sepsis PCT Algorithm           Lower Respiratory Tract                                      Infection PCT Algorithm    ----------------------------     ----------------------------         PCT < 0.25 ng/mL                PCT < 0.10 ng/mL         Strongly encourage             Strongly discourage   discontinuation of antibiotics    initiation of antibiotics    ----------------------------     -----------------------------       PCT 0.25 - 0.50 ng/mL            PCT 0.10 - 0.25 ng/mL               OR       >80% decrease in PCT            Discourage initiation of                                            antibiotics      Encourage discontinuation           of antibiotics    ----------------------------     -----------------------------         PCT >= 0.50 ng/mL              PCT 0.26 - 0.50 ng/mL               AND        <80% decrease in PCT             Encourage initiation of                                             antibiotics       Encourage continuation           of antibiotics    ----------------------------      -----------------------------        PCT >= 0.50  ng/mL                  PCT > 0.50 ng/mL               AND         increase in PCT                  Strongly encourage                                      initiation of antibiotics    Strongly encourage escalation           of antibiotics                                     -----------------------------                                           PCT <= 0.25 ng/mL                                                 OR                                        > 80% decrease in PCT                                     Discontinue / Do not initiate                                             antibiotics Performed at [facility], [address] [place], [place] [address]   Blood gas, venous ([facility], [facility], [facility])     Status: Abnormal   Collection Time: 11/29/18  3:59 PM  Result Value Ref Range   pH, Ven 7.39 7.250 - 7.430   pCO2, Ven 46 44.0 - 60.0 mmHg   pO2, Ven 61.0 (H) 32.0 - 45.0 mmHg   Bicarbonate 27.8 20.0 - 28.0 mmol/L   Acid-Base Excess 2.2 (H) 0.0 - 2.0 mmol/L   O2 Saturation 90.7 %   Patient temperature 37.0    Collection site VEIN    Sample type VEIN     Comment: Performed at [facility], [address] [place], [place] [address]  MRSA PCR Screening     Status: None   Collection Time: 11/29/18  6:36 PM  Result Value Ref Range   MRSA by PCR NEGATIVE NEGATIVE    Comment:        The GeneXpert MRSA Assay (FDA approved for NASAL specimens only), is one component of a comprehensive MRSA colonization surveillance program. It is not intended to diagnose MRSA infection nor to guide or monitor treatment for MRSA infections. Performed at [facility], [place], [place]  [address]    Ct Chest W Contrast  Result Date: 11/29/2018 CLINICAL DATA:  Shortness of breath and increasing oxygen requirements. EXAM: CT CHEST WITH CONTRAST TECHNIQUE: Multidetector CT imaging of the chest was performed during  intravenous contrast administration. CONTRAST:  38mL OMNIPAQUE IOHEXOL 300 MG/ML  SOLN COMPARISON:  11/07/2018 FINDINGS: Cardiovascular: The heart size is normal. No substantial pericardial effusion. Coronary artery calcification is evident. Atherosclerotic calcification is noted in the wall of the thoracic aorta. Right Port-A-Cath tip is positioned in the mid SVC. Main pulmonary arteries are enlarged. Mediastinum/Nodes: 8 mm short axis subcarinal lymph node evident. Precarinal lymph node measures up to 11 mm short axis. Multiple small nodes are seen in the prevascular space. There is no axillary lymphadenopathy. Lungs/Pleura: The central tracheobronchial airways are patent. Centrilobular and paraseptal emphysema evident. volume loss left hemithorax is similar to prior. Continued progression of interstitial and airspace disease in the left upper lobe surrounding the cavitary mass in this patient with known lung cancer.Interval progression of interstitial and irregular, ill-defined airspace disease in the left base with peripheral predominance. Areas of subpleural reticulation in the right lung are similar to prior. Upper Abdomen: Unremarkable Musculoskeletal: No worrisome lytic or sclerotic osseous abnormality. Old left rib fracture evident. IMPRESSION: 1. Volume loss left hemithorax with similar appearance of cavitary left upper lobe pulmonary lesion in this patient with known lung cancer. Since 11/07/2018, there is been progression of interstitial and ill-defined airspace disease in the left lower lobe which may represent metastatic progression or infectious etiology. 2. Borderline to mild mediastinal lymphadenopathy. Metastatic disease a concern. 3. Enlargement of the main pulmonary arteries compatible with pulmonary arterial hypertension. 4.  Aortic Atherosclerois (ICD10-170.0) 5.  Emphysema. ([id number]) Electronically Signed   By: Misty Stanley M.D.   On: 11/29/2018 18:55   Dg Chest Port 1 View  Result  Date: 11/29/2018 CLINICAL DATA:  Shortness of breath.  Exposure to COVID-19. EXAM: PORTABLE CHEST 1 VIEW COMPARISON:  Chest x-rays dated 11/20/2018 and 11/07/2018 and chest CTs dated 11/07/2018 and 10/04/2018 FINDINGS: There is a persistent extensive infiltrate in the left lung superimposed on chronic changes including a cavitary lesion in the left midzone laterally. Overall heart size is normal. Aortic atherosclerosis. Power port in good position. Right lung is clear. No acute bone abnormality. IMPRESSION: No change in the appearance of the chest since the prior study of 11/20/2018. Persistent extensive infiltrate on the left superimposed on chronic changes demonstrated on the prior CT scans. Aortic Atherosclerosis (ICD10-I70.0). Electronically Signed   By: Lorriane Shire M.D.   On: 11/29/2018 16:06    Pending Labs Unresulted Labs (From admission, onward)    Start     Ordered   11/29/18 2035  Novel Coronavirus, NAA (hospital order; send-out to ref lab)  (Novel Coronavirus, NAA [facility] Order; send-out to ref lab) with precautions panel)  Once,   STAT    Question Answer Comment  Current symptoms Fever and Cough   Excluded other viral illnesses Yes   Exposure Risk Contact with a known COVID19 positive person in the last 14 days   Patient immune status Immunocompromised      11/29/18 2034   11/29/18 1528  Blood Culture (routine x 2)  BLOOD CULTURE X 2,   STAT     11/29/18 1528   11/29/18 1528  Urinalysis, Routine w reflex microscopic  ONCE - STAT,   STAT     11/29/18 1528   Signed and Held  CBC  (enoxaparin (LOVENOX)    CrCl >/=  30 ml/min)  Once,   R    Comments:  Baseline for enoxaparin therapy IF NOT ALREADY DRAWN.  Notify MD if PLT < 100 K.    Signed and Held   Signed and Held  Creatinine, serum  (enoxaparin (LOVENOX)    CrCl >/= 30 ml/min)  Once,   R    Comments:  Baseline for enoxaparin therapy IF NOT ALREADY DRAWN.    Signed and Held   Signed and Held  Creatinine, serum  (enoxaparin  (LOVENOX)    CrCl >/= 30 ml/min)  Weekly,   R    Comments:  while on enoxaparin therapy    Signed and Held   Signed and Held  Basic metabolic panel  Tomorrow morning,   R     Signed and Held   Signed and Held  CBC  Tomorrow morning,   R     Signed and Held          Vitals/Pain Today's Vitals   11/29/18 1543 11/29/18 1545 11/29/18 1837  BP: 120/66  127/62  Pulse: 83  81  Resp: (!) 24  (!) 22  Temp: 97.9 F (36.6 C)    TempSrc: Oral    SpO2: 100%  100%  Weight:  85.5 kg   Height:  5\' 6"  (1.676 m)   PainSc:  0-No pain     Isolation Precautions Droplet and Contact precautions  Medications Medications  aztreonam (AZACTAM) 2 g in sodium chloride 0.9 % 100 mL IVPB (has no administration in time range)  vancomycin (VANCOCIN) 1,250 mg in sodium chloride 0.9 % 250 mL IVPB (has no administration in time range)  aztreonam (AZACTAM) 2 g in sodium chloride 0.9 % 100 mL IVPB (0 g Intravenous Stopped 11/29/18 1611)  vancomycin (VANCOCIN) 2,000 mg in sodium chloride 0.9 % 500 mL IVPB (2,000 mg Intravenous New Bag/Given 11/29/18 1655)  LORazepam (ATIVAN) injection 0.5 mg (0.5 mg Intravenous Given 11/29/18 1608)  iohexol (OMNIPAQUE) 300 MG/ML solution 75 mL (75 mLs Intravenous Contrast Given 11/29/18 1736)    Mobility walks Low fall risk   Focused Assessments respiratory   R Recommendations: See Admitting Provider Note  Report given to:   Additional Notes:

## 2018-11-29 NOTE — ED Triage Notes (Signed)
PT to ER via EMS from home.  Pt was admitted to hospital last week and had an increase in her oxygen at that time.  Pt has no new symptoms and no increase in her oxygen demand.  Pt has returned today because she was notified that an nurse who took care of her on her last visit tested positive for Covid-19.

## 2018-11-29 NOTE — ED Provider Notes (Signed)
[facility] Emergency Department Provider Note  ____________________________________________  Time seen: Approximately 3:15 PM  I have reviewed the triage vital signs and the nursing notes.   HISTORY  Chief Complaint Shortness of Breath    HPI Tamara Parrish is a 63 y.o. female, last chemo for lung cancer treatment 4 weeks ago, admitted for pneumonia last week, presenting with shortness of breath and increased oxygen requirement, COVID exposure.  Patient reports that over the past 2 days, she has been more short of breath than her baseline and has had to increase her oxygen from 2 L to 4 L for comfort.  Today, she reports that her daughter received a call that 1 of her inpatient nurses during her hospitalization last week has tested positive for coronavirus.  The patient denies any change in her baseline cough, congestion, rhinorrhea, sore throat, nausea vomiting or diarrhea.  She reports that her nose stays wet and that her throat stays dry due to her supplemental oxygen.  She has not had any chest pain or lower extremity swelling, or calf pain.  Today, she did a telephone visit with her oncologist, Dr. Tasia Catchings, who recommended evaluation in the emergency department.  EMS noted normal oxygenation on 4 L nasal cannula.   Past Medical History:  Diagnosis Date  . Cancer ([place])    lung ca DX 2019  . Collagen vascular disease (HCC)    RA  . DM2 (diabetes mellitus, type 2) ([place])   . Dyspnea   . Hyperlipemia   . Hypertension   . Osteomyelitis ([place])   . Pneumonia   . RA (rheumatoid arthritis) ([place])   . Rheumatoid arthritis [facility])     Patient Active Problem List   Diagnosis Date Noted  . Palliative care encounter   . Pneumonia 11/20/2018  . Dehydration 11/02/2018  . Anemia 10/26/2018  . Encounter for antineoplastic chemotherapy 07/12/2018  . Rheumatoid arthritis ([place]) 07/12/2018  . Thrombocytosis ([place]) 07/12/2018  . Goals of care, counseling/discussion  05/25/2018  . Malignant neoplasm of lung ([place]) 05/25/2018  . Recurrent pneumonia 02/21/2018  . PNA (pneumonia) 02/07/2018  . Elevated erythrocyte sedimentation rate 12/16/2017  . Dry cough 08/19/2017  . Rheumatoid nodule of multiple sites ([place]) 08/19/2017  . Polyneuropathy associated with underlying disease ([place]) 05/17/2017  . Seasonal allergic rhinitis due to pollen 05/17/2017  . Essential hypertension 10/09/2016  . Pure hypercholesterolemia 10/09/2016  . Rheumatoid arthritis, seropositive ([place]) 03/27/2016  . Polyarthralgia 03/18/2016  . Diabetes mellitus type 2, uncomplicated ([place]) 28/31/5176  . Allergic rhinitis 05/18/2014  . Microalbuminuria 04/18/2014    Past Surgical History:  Procedure Laterality Date  . ENDOBRONCHIAL ULTRASOUND N/A 05/13/2018   Procedure: ENDOBRONCHIAL ULTRASOUND;  Surgeon: [name], MD;  Location: [facility] ORS;  Service: Pulmonary;  Laterality: N/A;  . PORTA CATH INSERTION N/A 05/27/2018   Procedure: PORTA CATH INSERTION;  Surgeon: Katha Cabal, MD;  Location: [place] CV LAB;  Service: Cardiovascular;  Laterality: N/A;  . TOE SURGERY      Current Outpatient Rx  . Order #: [id number] Class: Normal  . Order #: [id number] Class: Normal  . Order #: [id number] Class: Historical Med  . Order #: [id number] Class: Historical Med  . Order #: [id number] Class: Historical Med  . Order #: [id number] Class: Historical Med  . Order #: [id number] Class: Normal  . Order #: [id number] Class: Normal  . Order #: [id number] Class: Normal  . Order #: [id number] Class: Normal  . Order #: [id number] Class: Historical Med  . Order #: [id number] Class: Historical Med  . Order #:  [id number] Class: No Print  . Order #: [id number] Class: Normal  . Order #: [id number] Class: Historical Med  . Order #: [id number] Class: Historical Med  . Order #: [id number] Class: Normal  . Order #: [id number] Class: Historical Med  . Order #: [id number] Class: Normal  . Order #: [id number] Class: Print  .  Order #: [id number] Class: Normal  . Order #: [id number] Class: Normal  . Order #: [id number] Class: Normal  . Order #: [id number] Class: Normal  . Order #: [id number] Class: Normal  . Order #: [id number] Class: Normal  . Order #: [id number] Class: Historical Med    Allergies Ibuprofen and Penicillins  Family History  Problem Relation Age of Onset  . Diabetes Mother   . Lung cancer Father     Social History Social History   Tobacco Use  . Smoking status: Former Smoker    Packs/day: 1.00    Years: 40.00    Pack years: 40.00    Types: Cigarettes    Last attempt to quit: 11/22/2017    Years since quitting: 1.0  . Smokeless tobacco: Never Used  . Tobacco comment: quit 2 weeks ago  Substance Use Topics  . Alcohol use: Never    Frequency: Never  . Drug use: Never    Review of Systems Constitutional: No fever/chills.  No lightheadedness or syncope.  No diaphoresis. Eyes: No visual changes. ENT: Chronic dry throat due to supplemental oxygen but denies any sore throat. No congestion or rhinorrhea.  Denies anosmia or change in taste. Cardiovascular: Denies chest pain. Denies palpitations. Respiratory: As of increased oxygen requirement and shortness of breath.  No cough. Gastrointestinal: No abdominal pain.  No nausea, no vomiting.  No diarrhea.  No constipation. Genitourinary: Negative for dysuria. Musculoskeletal: Negative for back pain.  No lower extremity swelling or calf pain. Skin: Negative for rash. Neurological: Negative for headaches. No focal numbness, tingling or weakness.     ____________________________________________   PHYSICAL EXAM:  VITAL SIGNS: ED Triage Vitals  Enc Vitals Group     BP      Pulse      Resp      Temp      Temp src      SpO2      Weight      Height      Head Circumference      Peak Flow      Pain Score      Pain Loc      Pain Edu?      Excl. in [place]?     Constitutional: Alert and oriented. Answers questions appropriately.  Chronically  ill-appearing. Eyes: Conjunctivae are normal.  EOMI. No scleral icterus.  No eye discharge. Head: Atraumatic. Nose: No congestion. Neck: No stridor.  Supple.  No JVD.  No meningismus. Cardiovascular: Normal rate, regular rhythm. No murmurs, rubs or gallops.  Respiratory: Mild tachypnea without significant accessory muscle use or retractions.  The patient does have abnormal lung sounds in the left lung base without any wheezes.  She is able to speak in full sentences fairly comfortably. Gastrointestinal: Overweight.  Soft, nontender and nondistended.  No guarding or rebound.  No peritoneal signs. Musculoskeletal: No LE edema. No ttp in the calves or palpable cords.  Negative Homan's sign. Neurologic:  A&Ox3.  Speech is clear.  Face and smile are symmetric.  EOMI.  Moves all extremities well. Skin:  Skin is warm, dry and intact. No rash noted. Psychiatric: Mood and affect are normal. Speech and behavior are normal.  Normal judgement.  ____________________________________________   LABS (all  labs ordered are listed, but only abnormal results are displayed)  Labs Reviewed  COMPREHENSIVE METABOLIC PANEL - Abnormal; Notable for the following components:      Result Value   Potassium 5.2 (*)    Glucose, Bld 108 (*)    Albumin 2.2 (*)    All other components within normal limits  CBC WITH DIFFERENTIAL/PLATELET - Abnormal; Notable for the following components:   WBC 14.1 (*)    RBC 2.86 (*)    Hemoglobin 8.3 (*)    HCT 28.0 (*)    MCHC 29.6 (*)    RDW 16.0 (*)    Platelets 437 (*)    Neutro Abs 12.4 (*)    Lymphs Abs 0.5 (*)    Abs Immature Granulocytes 0.16 (*)    All other components within normal limits  BRAIN NATRIURETIC PEPTIDE - Abnormal; Notable for the following components:   B Natriuretic Peptide 142.0 (*)    All other components within normal limits  BLOOD GAS, VENOUS - Abnormal; Notable for the following components:   pO2, Ven 61.0 (*)    Acid-Base Excess 2.2 (*)    All  other components within normal limits  CULTURE, BLOOD (ROUTINE X 2)  CULTURE, BLOOD (ROUTINE X 2)  MRSA PCR SCREENING  LACTIC ACID, PLASMA  TROPONIN I  INFLUENZA PANEL BY PCR (TYPE A & B)  PROCALCITONIN  URINALYSIS, ROUTINE W REFLEX MICROSCOPIC   ____________________________________________  EKG  ED ECG REPORT I, Anne-Caroline Mariea Clonts, the attending physician, personally viewed and interpreted this ECG.   Date: 11/29/2018  EKG Time: 1618  Rate: 82  Rhythm: normal sinus rhythm  Axis: normal  Intervals:none  ST&T Change: No STEMI  ____________________________________________  RADIOLOGY  Ct Chest W Contrast  Result Date: 11/29/2018 CLINICAL DATA:  Shortness of breath and increasing oxygen requirements. EXAM: CT CHEST WITH CONTRAST TECHNIQUE: Multidetector CT imaging of the chest was performed during intravenous contrast administration. CONTRAST:  45mL OMNIPAQUE IOHEXOL 300 MG/ML  SOLN COMPARISON:  11/07/2018 FINDINGS: Cardiovascular: The heart size is normal. No substantial pericardial effusion. Coronary artery calcification is evident. Atherosclerotic calcification is noted in the wall of the thoracic aorta. Right Port-A-Cath tip is positioned in the mid SVC. Main pulmonary arteries are enlarged. Mediastinum/Nodes: 8 mm short axis subcarinal lymph node evident. Precarinal lymph node measures up to 11 mm short axis. Multiple small nodes are seen in the prevascular space. There is no axillary lymphadenopathy. Lungs/Pleura: The central tracheobronchial airways are patent. Centrilobular and paraseptal emphysema evident. volume loss left hemithorax is similar to prior. Continued progression of interstitial and airspace disease in the left upper lobe surrounding the cavitary mass in this patient with known lung cancer.Interval progression of interstitial and irregular, ill-defined airspace disease in the left base with peripheral predominance. Areas of subpleural reticulation in the right lung  are similar to prior. Upper Abdomen: Unremarkable Musculoskeletal: No worrisome lytic or sclerotic osseous abnormality. Old left rib fracture evident. IMPRESSION: 1. Volume loss left hemithorax with similar appearance of cavitary left upper lobe pulmonary lesion in this patient with known lung cancer. Since 11/07/2018, there is been progression of interstitial and ill-defined airspace disease in the left lower lobe which may represent metastatic progression or infectious etiology. 2. Borderline to mild mediastinal lymphadenopathy. Metastatic disease a concern. 3. Enlargement of the main pulmonary arteries compatible with pulmonary arterial hypertension. 4.  Aortic Atherosclerois (ICD10-170.0) 5.  Emphysema. ([id number]) Electronically Signed   By: Misty Stanley M.D.   On: 11/29/2018 18:55   Dg Chest  Port 1 View  Result Date: 11/29/2018 CLINICAL DATA:  Shortness of breath.  Exposure to COVID-19. EXAM: PORTABLE CHEST 1 VIEW COMPARISON:  Chest x-rays dated 11/20/2018 and 11/07/2018 and chest CTs dated 11/07/2018 and 10/04/2018 FINDINGS: There is a persistent extensive infiltrate in the left lung superimposed on chronic changes including a cavitary lesion in the left midzone laterally. Overall heart size is normal. Aortic atherosclerosis. Power port in good position. Right lung is clear. No acute bone abnormality. IMPRESSION: No change in the appearance of the chest since the prior study of 11/20/2018. Persistent extensive infiltrate on the left superimposed on chronic changes demonstrated on the prior CT scans. Aortic Atherosclerosis (ICD10-I70.0). Electronically Signed   By: Lorriane Shire M.D.   On: 11/29/2018 16:06    ____________________________________________   PROCEDURES  Procedure(s) performed: None  Procedures  Critical Care performed: Yes, see critical care note(s) ____________________________________________   INITIAL IMPRESSION / ASSESSMENT AND PLAN / ED COURSE  Pertinent labs &  imaging results that were available during my care of the patient were reviewed by me and considered in my medical decision making (see chart for details).  63 y.o. female with lung cancer, recently hospitalized for pneumonia, presenting with increased oxygen requirement and shortness of breath.  Overall, the patient does not have any other signs or symptoms of infection, but of course and her immunosuppressed state, she is at high risk for bacterial pneumonia or a viral infection including coronavirus.  I am unable to see any documentation about the telephone conversation where she was told she had a positive encounter, but given her high risk, will do a coronavirus swab today.  I have reviewed her chest x-ray, which shows diffuse opacity in the left lung, but it on my read, it is grossly unchanged from 11/20/2018.  The patient has been on Levaquin at home and did take a dose today; I will hold off on any further antibiotics until we have more information from her CT scan.  A cardiac cause for symptoms including CHF or acute MI is less likely but we will evaluate for that as well.  I will touch base with her oncology team for final disposition.  ----------------------------------------- 7:13 PM on 11/29/2018 -----------------------------------------  Patient does have some findings on her CT chest that are concerning for left lower lobe infiltrate.  Her lactic acid is normal but her white blood cell count is elevated.  She is lymphopenic, but this is baseline for her.  Her procalcitonin is 0.24.  This is elevated compared to 7 days ago.  Whether her symptoms are due to a bacterial or a viral etiology continues to be unclear.  She has received abx and coronavirus testing is pending.  She will be admitted to the hospitalist at this time.   CRITICAL CARE Performed by: Eula Listen   Total critical care time: 30 minutes  Critical care time was exclusive of separately billable procedures and  treating other patients.  Critical care was necessary to treat or prevent imminent or life-threatening deterioration.  Critical care was time spent personally by me on the following activities: development of treatment plan with patient and/or surrogate as well as nursing, discussions with consultants, evaluation of patient's response to treatment, examination of patient, obtaining history from patient or surrogate, ordering and performing treatments and interventions, ordering and review of laboratory studies, ordering and review of radiographic studies, pulse oximetry and re-evaluation of patient's condition.   ____________________________________________  FINAL CLINICAL IMPRESSION(S) / ED DIAGNOSES  Final diagnoses:  HCAP (healthcare-associated pneumonia)  Hypoxia         NEW MEDICATIONS STARTED DURING THIS VISIT:  New Prescriptions   No medications on file      Eula Listen, MD 11/29/18 [phone]

## 2018-11-29 NOTE — Telephone Encounter (Signed)
Pt is in ED.

## 2018-11-29 NOTE — Telephone Encounter (Signed)
Tamara Parrish called requesting Dr Tasia Catchings return her call to explain what her mother needs to do. [phone]

## 2018-11-29 NOTE — ED Notes (Signed)
Daughter, Wende Neighbors, [phone]

## 2018-11-29 NOTE — ED Notes (Signed)
KIM RN, aware of bed assigned

## 2018-11-29 NOTE — ED Notes (Signed)
Called to give report to receiving RN. Unable to speak with RN due to being in another room. Will call back in 15 min. Notified handoff report in chart and would call back in a few mins or receiving RN to call if has questions.

## 2018-11-29 NOTE — Progress Notes (Signed)
Pharmacy Antibiotic Note  Tamara Parrish is a 63 y.o. female admitted on 11/29/2018 with pneumonia.  Pharmacy has been consulted for vancomycin and azactam dosing.  Plan: Vancomycin 2000mg  Loading dose Vancomycin 1250 mg IV Q q24h hrs. Goal AUC 400-550. Expected AUC: 496 SCr used: 0.81   Height: 5\' 6"  (167.6 cm) Weight: 188 lb 8 oz (85.5 kg) IBW/kg (Calculated) : 59.3  Temp (24hrs), Avg:97.9 F (36.6 C), Min:97.9 F (36.6 C), Max:97.9 F (36.6 C)  Recent Labs  Lab 11/29/18 1559  WBC 14.1*  CREATININE 0.81  LATICACIDVEN 1.1    Estimated Creatinine Clearance: 77.3 mL/min (by C-G formula based on SCr of 0.81 mg/dL).    Allergies  Allergen Reactions  . Ibuprofen Other (See Comments)    Is not supposed to take because of her kidneys.  . Penicillins Other (See Comments)    Has patient had a PCN reaction causing immediate rash, facial/tongue/throat swelling, SOB or lightheadedness with hypotension: Unknown Has patient had a PCN reaction causing severe rash involving mucus membranes or skin necrosis: Unknown Has patient had a PCN reaction that required hospitalization: Unknown Has patient had a PCN reaction occurring within the last 10 years: Unknown If all of the above answers are "NO", then may proceed with Cephalosporin use.    Antimicrobials this admission: Vancomycin  4/7 >>  Azactam 4/7 >>   Microbiology results: 4/7 BCx: pending 4/7 MRSA PCR: pending  Thank you for allowing pharmacy to be a part of this patient's care.  Daymen Hassebrock 11/29/2018 5:52 PM

## 2018-11-29 NOTE — H&P (Signed)
[facility] NAME: Tamara Parrish    MR#:  [id number]  DATE OF BIRTH:  Jan 13, 1956  DATE OF ADMISSION:  11/29/2018  PRIMARY CARE PHYSICIAN: Glendon Axe, MD   REQUESTING/REFERRING PHYSICIAN: Mariea Clonts, MD  CHIEF COMPLAINT:   Chief Complaint  Patient presents with  . Shortness of Breath    HISTORY OF PRESENT ILLNESS:  Tamara Parrish  is a 63 y.o. female who presents with chief complaint as above.  Presents to the ED with persistent shortness of breath.  She was just admitted here for pneumonia.  She was treated and discharged home, but states that her shortness of breath is gotten worse and that she has had a significant dry cough.  She also states that she was contacted by the hospital and told that she had recent contact with a nurse during her last day who has tested positive for corona virus.  Hospitalist called for admission  PAST MEDICAL HISTORY:   Past Medical History:  Diagnosis Date  . Cancer ([place])    lung ca DX 2019  . Collagen vascular disease (HCC)    RA  . DM2 (diabetes mellitus, type 2) ([place])   . Dyspnea   . Hyperlipemia   . Hypertension   . Osteomyelitis ([place])   . Pneumonia   . RA (rheumatoid arthritis) ([place])   . Rheumatoid arthritis ([place])      PAST SURGICAL HISTORY:   Past Surgical History:  Procedure Laterality Date  . ENDOBRONCHIAL ULTRASOUND N/A 05/13/2018   Procedure: ENDOBRONCHIAL ULTRASOUND;  Surgeon: [name], MD;  Location: [facility];  Service: Pulmonary;  Laterality: N/A;  . PORTA CATH INSERTION N/A 05/27/2018   Procedure: PORTA CATH INSERTION;  Surgeon: Katha Cabal, MD;  Location: [facility];  Service: Cardiovascular;  Laterality: N/A;  . TOE SURGERY       SOCIAL HISTORY:   Social History   Tobacco Use  . Smoking status: Former Smoker    Packs/day: 1.00    Years: 40.00    [place] years: 40.00    Types: Cigarettes    Last attempt to quit: 11/22/2017     Years since quitting: 1.0  . Smokeless tobacco: Never Used  . Tobacco comment: quit 2 weeks ago  Substance Use Topics  . Alcohol use: Never    Frequency: Never     FAMILY HISTORY:   Family History  Problem Relation Age of Onset  . Diabetes Mother   . Lung cancer Father      DRUG ALLERGIES:   Allergies  Allergen Reactions  . Ibuprofen Other (See Comments)    Is not supposed to take because of her kidneys.  . Penicillins Other (See Comments)    Has patient had a PCN reaction causing immediate rash, facial/tongue/throat swelling, SOB or lightheadedness with hypotension: Unknown Has patient had a PCN reaction causing severe rash involving mucus membranes or skin necrosis: Unknown Has patient had a PCN reaction that required hospitalization: Unknown Has patient had a PCN reaction occurring within the last 10 years: Unknown If all of the above answers are "NO", then may proceed with Cephalosporin use.    MEDICATIONS AT HOME:   Prior to Admission medications   Medication Sig Start Date End Date Taking? Authorizing Provider  albuterol (PROVENTIL HFA;VENTOLIN HFA) 108 (90 Base) MCG/ACT inhaler Inhale 2 puffs into the lungs every 6 (six) hours as needed for wheezing or shortness of breath. 08/23/18   Earlie Server, MD  albuterol (PROVENTIL) (2.5 MG/3ML) 0.083% nebulizer solution Take 3 mLs (2.5 mg total) by nebulization every 6 (six) hours as needed for wheezing or shortness of breath. 11/22/18   Bettey Costa, MD  ALPRAZolam Duanne Moron) 0.25 MG tablet Take 0.25 mg by mouth at bedtime as needed for anxiety.    [provider]  amLODipine (NORVASC) 5 MG tablet Take 5 mg by mouth daily. 01/20/18   [provider]  aspirin 81 MG chewable tablet Chew 1 tablet by mouth daily.    [provider]  atorvastatin (LIPITOR) 40 MG tablet Take 40 mg by mouth daily. 01/20/18   [provider]  budesonide-formoterol (SYMBICORT) 160-4.5 MCG/ACT inhaler Inhale 2 puffs into the  lungs 2 (two) times daily. 10/12/18   [name], MD  dexamethasone (DECADRON) 4 MG tablet Take 2 tablets (8 mg total) by mouth daily. Start the day after chemotherapy for 2 days. 05/25/18   Earlie Server, MD  feeding supplement, ENSURE ENLIVE, (ENSURE ENLIVE) LIQD Take 237 mLs by mouth 2 (two) times daily between meals. Patient not taking: Reported on 11/29/2018 11/22/18   Bettey Costa, MD  ferrous sulfate 325 (65 FE) MG EC tablet Take 1 tablet (325 mg total) by mouth 2 (two) times daily with a meal. 10/12/18   Earlie Server, MD  folic acid (FOLVITE) 1 MG tablet Take 1 mg by mouth daily. 01/20/18   [provider]  hydroxychloroquine (PLAQUENIL) 200 MG tablet 1 tab twice a day, 90 days 05/31/18   [provider]  Insulin Glargine (BASAGLAR KWIKPEN) 100 UNIT/ML SOPN Inject 0.1 mLs (10 Units total) into the skin every morning. 11/22/18   Bettey Costa, MD  levofloxacin (LEVAQUIN) 750 MG tablet Take 1 tablet (750 mg total) by mouth daily. 11/22/18   Bettey Costa, MD  lisinopril (PRINIVIL,ZESTRIL) 40 MG tablet Take 40 mg by mouth daily. 01/20/18   [provider]  metoprolol tartrate (LOPRESSOR) 25 MG tablet Take 25 mg by mouth 2 (two) times daily. 01/20/18   [provider]  mirtazapine (REMERON) 7.5 MG tablet Take 1 tablet (7.5 mg total) by mouth at bedtime. 08/23/18   Earlie Server, MD  NARCAN 4 MG/0.1ML LIQD nasal spray kit CALL 911. ADMINISTER A SINGLE SPRAY OF NARCAN IN ONE NOSTRIL. REPEAT EVERY 3 MINUTES AS NEEDED IF NO OR MINIMAL RESPONSE. 08/03/18   [provider]  nystatin (MYCOSTATIN) 100000 UNIT/ML suspension Take 5 mLs (500,000 Units total) by mouth 4 (four) times daily. 11/02/18   Earlie Server, MD  omeprazole (PRILOSEC) 20 MG capsule Take 1 capsule (20 mg total) by mouth daily. 07/12/18   Earlie Server, MD  ondansetron (ZOFRAN) 8 MG tablet Take 1 tablet (8 mg total) by mouth 2 (two) times daily as needed for refractory nausea / vomiting. Start on day 3 after chemo. 08/23/18    Earlie Server, MD  oxyCODONE (OXYCONTIN) 10 mg 12 hr tablet Take 1 tablet (10 mg total) by mouth every 12 (twelve) hours. 11/23/18   Borders, Kirt Boys, NP  Oxycodone HCl 10 MG TABS Take 1 tablet (10 mg total) by mouth every 6 (six) hours as needed. 11/23/18   Borders, Kirt Boys, NP  predniSONE (DELTASONE) 5 MG tablet Take 1 tablet (5 mg total) by mouth daily with breakfast. 11/07/18   Earlie Server, MD  prochlorperazine (COMPAZINE) 10 MG tablet Take 1 tablet (10 mg total) by mouth every 6 (six) hours as needed (Nausea or vomiting). 05/25/18   Earlie Server, MD  sucralfate (CARAFATE) 1 g tablet  Take 1 tablet (1 g total) by mouth 3 (three) times daily for 5 days. Dissolve in 3-4 tbsp warm water, swish and swallow 10/28/18 11/02/18  Lule, Sara Chu, PA  triamterene-hydrochlorothiazide (DYAZIDE) 37.5-25 MG capsule Take 1 capsule by mouth daily. 01/20/18   [provider]    REVIEW OF SYSTEMS:  Review of Systems  Constitutional: Positive for malaise/fatigue. Negative for chills, fever and weight loss.  HENT: Negative for ear pain, hearing loss and tinnitus.   Eyes: Negative for blurred vision, double vision, pain and redness.  Respiratory: Positive for cough and shortness of breath. Negative for hemoptysis.   Cardiovascular: Negative for chest pain, palpitations, orthopnea and leg swelling.  Gastrointestinal: Negative for abdominal pain, constipation, diarrhea, nausea and vomiting.  Genitourinary: Negative for dysuria, frequency and hematuria.  Musculoskeletal: Negative for back pain, joint pain and neck pain.  Skin:       No acne, rash, or lesions  Neurological: Negative for dizziness, tremors, focal weakness and weakness.  Endo/Heme/Allergies: Negative for polydipsia. Does not bruise/bleed easily.  Psychiatric/Behavioral: Negative for depression. The patient is not nervous/anxious and does not have insomnia.      VITAL SIGNS:   Vitals:   11/29/18 1543 11/29/18 1545 11/29/18 1837  BP: 120/66  127/62  Pulse:  83  81  Resp: (!) 24  (!) 22  Temp: 97.9 F (36.6 C)    TempSrc: Oral    SpO2: 100%  100%  Weight:  85.5 kg   Height:  '5\' 6"'$  (1.676 m)    Wt Readings from Last 3 Encounters:  11/29/18 85.5 kg  11/20/18 85.5 kg  11/07/18 85 kg    PHYSICAL EXAMINATION:  Physical Exam  Vitals reviewed. Constitutional: She is oriented to person, place, and time. She appears well-developed and well-nourished. No distress.  HENT:  Head: Normocephalic and atraumatic.  Mouth/Throat: Oropharynx is clear and moist.  Eyes: Pupils are equal, round, and reactive to light. Conjunctivae and EOM are normal. No scleral icterus.  Neck: Normal range of motion. Neck supple. No JVD present. No thyromegaly present.  Cardiovascular: Normal rate, regular rhythm and intact distal pulses. Exam reveals no gallop and no friction rub.  No murmur heard. Respiratory: Effort normal and breath sounds normal. No respiratory distress. She has no wheezes. She has no rales.  Rhonchi  GI: Soft. Bowel sounds are normal. She exhibits no distension. There is no abdominal tenderness.  Musculoskeletal: Normal range of motion.        General: No edema.     Comments: No arthritis, no gout  Lymphadenopathy:    She has no cervical adenopathy.  Neurological: She is alert and oriented to person, place, and time. No cranial nerve deficit.  No dysarthria, no aphasia  Skin: Skin is warm and dry. No rash noted. No erythema.  Psychiatric: She has a normal mood and affect. Her behavior is normal. Judgment and thought content normal.    LABORATORY PANEL:   CBC Recent Labs  Lab 11/29/18 1559  WBC 14.1*  HGB 8.3*  HCT 28.0*  PLT 437*   ------------------------------------------------------------------------------------------------------------------  Chemistries  Recent Labs  Lab 11/29/18 1559  NA 136  K 5.2*  CL 105  CO2 25  GLUCOSE 108*  BUN 18  CREATININE 0.81  CALCIUM 9.5  AST 19  ALT 13  ALKPHOS 74  BILITOT 0.3    ------------------------------------------------------------------------------------------------------------------  Cardiac Enzymes Recent Labs  Lab 11/29/18 1559  TROPONINI <0.03   ------------------------------------------------------------------------------------------------------------------  RADIOLOGY:  Ct Chest W Contrast  Result  Date: 11/29/2018 CLINICAL DATA:  Shortness of breath and increasing oxygen requirements. EXAM: CT CHEST WITH CONTRAST TECHNIQUE: Multidetector CT imaging of the chest was performed during intravenous contrast administration. CONTRAST:  36m OMNIPAQUE IOHEXOL 300 MG/ML  SOLN COMPARISON:  11/07/2018 FINDINGS: Cardiovascular: The heart size is normal. No substantial pericardial effusion. Coronary artery calcification is evident. Atherosclerotic calcification is noted in the wall of the thoracic aorta. Right Port-A-Cath tip is positioned in the mid SVC. Main pulmonary arteries are enlarged. Mediastinum/Nodes: 8 mm short axis subcarinal lymph node evident. Precarinal lymph node measures up to 11 mm short axis. Multiple small nodes are seen in the prevascular space. There is no axillary lymphadenopathy. Lungs/Pleura: The central tracheobronchial airways are patent. Centrilobular and paraseptal emphysema evident. volume loss left hemithorax is similar to prior. Continued progression of interstitial and airspace disease in the left upper lobe surrounding the cavitary mass in this patient with known lung cancer.Interval progression of interstitial and irregular, ill-defined airspace disease in the left base with peripheral predominance. Areas of subpleural reticulation in the right lung are similar to prior. Upper Abdomen: Unremarkable Musculoskeletal: No worrisome lytic or sclerotic osseous abnormality. Old left rib fracture evident. IMPRESSION: 1. Volume loss left hemithorax with similar appearance of cavitary left upper lobe pulmonary lesion in this patient with known lung  cancer. Since 11/07/2018, there is been progression of interstitial and ill-defined airspace disease in the left lower lobe which may represent metastatic progression or infectious etiology. 2. Borderline to mild mediastinal lymphadenopathy. Metastatic disease a concern. 3. Enlargement of the main pulmonary arteries compatible with pulmonary arterial hypertension. 4.  Aortic Atherosclerois (ICD10-170.0) 5.  Emphysema. (([id number]) Electronically Signed   By: EMisty StanleyM.D.   On: 11/29/2018 18:55   Dg Chest Port 1 View  Result Date: 11/29/2018 CLINICAL DATA:  Shortness of breath.  Exposure to COVID-19. EXAM: PORTABLE CHEST 1 VIEW COMPARISON:  Chest x-rays dated 11/20/2018 and 11/07/2018 and chest CTs dated 11/07/2018 and 10/04/2018 FINDINGS: There is a persistent extensive infiltrate in the left lung superimposed on chronic changes including a cavitary lesion in the left midzone laterally. Overall heart size is normal. Aortic atherosclerosis. Power port in good position. Right lung is clear. No acute bone abnormality. IMPRESSION: No change in the appearance of the chest since the prior study of 11/20/2018. Persistent extensive infiltrate on the left superimposed on chronic changes demonstrated on the prior CT scans. Aortic Atherosclerosis (ICD10-I70.0). Electronically Signed   By: JLorriane ShireM.D.   On: 11/29/2018 16:06    EKG:   Orders placed or performed during the hospital encounter of 11/29/18  . ED EKG 12-Lead  . ED EKG 12-Lead    IMPRESSION AND PLAN:  Principal Problem:   Sepsis ([facility] -IV antibiotics started, blood pressure stable, lactic acid within normal limits, sepsis is due to pneumonia, culture sent Active Problems:   HCAP (healthcare-associated pneumonia) -IV antibiotics, supportive treatment   Suspected Covid-19 Virus Infection -droplet and contact precautions, avoid aerosolizing procedures if possible, over 19 test pending   Diabetes mellitus type 2, uncomplicated (HCC)  -sliding scale insulin coverage   Essential hypertension -continue home meds   Rheumatoid arthritis ([facility] -continue home meds  Chart review performed and case discussed with ED provider. Labs, imaging and/or ECG reviewed by provider and discussed with patient/family. Management plans discussed with the patient and/or family.  DVT PROPHYLAXIS: SubQ lovenox   GI PROPHYLAXIS:  PPI   ADMISSION STATUS: Inpatient     CODE STATUS: Full Code Status History  Date Active Date Inactive Code Status Order ID Comments User Context   11/20/2018 2045 11/22/2018 1806 Full Code [id number]  Loletha Grayer, MD ED   10/26/2018 2037 10/28/2018 2050 Partial Code [id number]  Hillary Bow, MD Inpatient   10/26/2018 1728 10/26/2018 2037 DNR [id number]  Hillary Bow, MD ED   02/21/2018 2004 02/24/2018 1542 Full Code [id number]  Gladstone Lighter, MD ED   02/07/2018 2202 02/11/2018 1738 Full Code [id number]  Dustin Flock, MD Inpatient      TOTAL TIME TAKING CARE OF THIS PATIENT: 45 minutes.   Ethlyn Daniels 11/29/2018, 8:34 PM  [facility]  Office  [phone]  CC: Primary care physician; Glendon Axe, MD  Note:  This document was prepared using Dragon voice recognition software and may include unintentional dictation errors.

## 2018-11-29 NOTE — Progress Notes (Signed)
Patient connected via telephone. Patient is at home with daughter Audelia Acton.Patient gets out of breath with little activity so she is trying not to walk a lot. Requesting increase in pain medication. Pt states states that both feet are swollen, she has been elevating them. . Requesting refills on xanax, Symbicort, oxycodone and zofran.

## 2018-11-29 NOTE — Progress Notes (Signed)
HEMATOLOGY-ONCOLOGY TeleHEALTH VISIT PROGRESS NOTE  I connected with Tamara Parrish on 11/29/18 at 11:15 AM EDT by telephone and verified that I am speaking with the correct person using two identifiers.  I discussed the limitations, risks, security and privacy concerns of performing an evaluation and management service by telemedicine and the availability of in-person appointments. I also discussed with the patient that there may be a patient responsible charge related to this service. The patient expressed understanding and agreed to proceed.   Other persons participating in the visit and their role in the encounter: Janeann Merl, RN, checking in patient.    Patient's location: Home Provider's location: work   Risk analyst Complaint: Shortness of breath, pain all over, posthospitalization follow-up.  INTERVAL HISTORY Tamara Parrish is a 63 y.o. female who has above history reviewed by me today presents for follow up visit for management of lung cancer, cancer/treatment related symptoms, post hospitalization follow-up and shortness of breath. Problems and complaints are listed below:    #Patient was recently hospitalized due to shortness of breath.  She was found to have acute on chronic anemia with hemoglobin of 6.5, status post PRBC transfusion.  During her hospitalization, work-up also showed chest x-ray positive for left lower lobe pneumonia, worsened hypoxic respiratory failure.  Patient was treated with antibiotics. She also establish care with palliative care.  Pain regimen was further adjusted during her hospitalization.  Added OxyContin 10 mg twice every 12 hours, continued on oxycodone 10 mg every 6 hours as needed.  Patient had called yesterday and informed us that she was called by her primary care physician and she was informed that 1 of her nurse during her admission was tested positive for COVID-19.  She is now on quarantine and cannot come to cancer center. I offered her to  do virtual visit today however patient did not have equipment needed for face-to-face video visit. This is a telephone visit. Patient reports feeling more shortness of breath since discharge.  She usually uses 2 to 3 L of oxygen, now she has to use 4 L of oxygen at home.  She tried not to walk around because shortness of breath get worsened with exertion Reports that her legs are swollen.\ Generalized joint pains, she reports OxyContin 10 mg every 12 hours has give her some relief in pain.  She rated 6 out of 10 after started on the long-acting.  She continues to use oxycodone 10 mg every 6 hours as needed for pain. She request refills. Denies any fever, chills. Continue to have cough, which is slightly better comparing to discharge. She also feels anxious about being told of COVID-19 exposure  Review of Systems  Constitutional: Positive for fatigue. Negative for appetite change, chills and fever.  HENT:   Negative for hearing loss and voice change.   Eyes: Negative for eye problems.  Respiratory: Positive for cough and shortness of breath. Negative for chest tightness.   Cardiovascular: Positive for leg swelling. Negative for chest pain.  Gastrointestinal: Negative for abdominal distention, abdominal pain and blood in stool.  Endocrine: Negative for hot flashes.  Genitourinary: Negative for difficulty urinating and frequency.   Musculoskeletal: Negative for arthralgias.  Skin: Negative for itching and rash.  Neurological: Negative for extremity weakness.  Hematological: Negative for adenopathy.  Psychiatric/Behavioral: Negative for confusion.    Past Medical History:  Diagnosis Date  . Cancer ([place])    lung ca DX 2019  . Collagen vascular disease ([facility])    RA  .  DM2 (diabetes mellitus, type 2) ([place])   . Dyspnea   . Hyperlipemia   . Hypertension   . Osteomyelitis ([place])   . Pneumonia   . RA (rheumatoid arthritis) ([place])   . Rheumatoid arthritis [facility])    Past Surgical History:   Procedure Laterality Date  . ENDOBRONCHIAL ULTRASOUND N/A 05/13/2018   Procedure: ENDOBRONCHIAL ULTRASOUND;  Surgeon: [name], MD;  Location: [facility] ORS;  Service: Pulmonary;  Laterality: N/A;  . PORTA CATH INSERTION N/A 05/27/2018   Procedure: PORTA CATH INSERTION;  Surgeon: Katha Cabal, MD;  Location: [place] CV LAB;  Service: Cardiovascular;  Laterality: N/A;  . TOE SURGERY      Family History  Problem Relation Age of Onset  . Diabetes Mother   . Lung cancer Father     Social History   Socioeconomic History  . Marital status: Single    Spouse name: Not on file  . Number of children: 2  . Years of education: Not on file  . Highest education level: Not on file  Occupational History  . Not on file  Social Needs  . Financial resource strain: Not very hard  . Food insecurity:    Worry: Never true    Inability: Never true  . Transportation needs:    Medical: No    Non-medical: No  Tobacco Use  . Smoking status: Former Smoker    Packs/day: 1.00    Years: 40.00    Pack years: 40.00    Types: Cigarettes    Last attempt to quit: 11/22/2017    Years since quitting: 1.0  . Smokeless tobacco: Never Used  . Tobacco comment: quit 2 weeks ago  Substance and Sexual Activity  . Alcohol use: Never    Frequency: Never  . Drug use: Never  . Sexual activity: Not Currently  Lifestyle  . Physical activity:    Days per week: 0 days    Minutes per session: Not on file  . Stress: To some extent  Relationships  . Social connections:    Talks on phone: Three times a week    Gets together: More than three times a week    Attends religious service: Not on file    Active member of club or organization: No    Attends meetings of clubs or organizations: Never    Relationship status: Never married  . Intimate partner violence:    Fear of current or ex partner: No    Emotionally abused: No    Physically abused: No    Forced sexual activity: No  Other Topics  Concern  . Not on file  Social History Narrative   Living at home with daughter.  Ambulates with a walker at baseline.    Current Outpatient Medications on File Prior to Visit  Medication Sig Dispense Refill  . albuterol (PROVENTIL HFA;VENTOLIN HFA) 108 (90 Base) MCG/ACT inhaler Inhale 2 puffs into the lungs every 6 (six) hours as needed for wheezing or shortness of breath. 1 Inhaler 2  . albuterol (PROVENTIL) (2.5 MG/3ML) 0.083% nebulizer solution Take 3 mLs (2.5 mg total) by nebulization every 6 (six) hours as needed for wheezing or shortness of breath. 75 mL 12  . ALPRAZolam (XANAX) 0.25 MG tablet Take 0.25 mg by mouth at bedtime as needed for anxiety.    Marland Kitchen amLODipine (NORVASC) 5 MG tablet Take 5 mg by mouth daily.  3  . aspirin 81 MG chewable tablet Chew 1 tablet by mouth daily.    Marland Kitchen  atorvastatin (LIPITOR) 40 MG tablet Take 40 mg by mouth daily.  3  . budesonide-formoterol (SYMBICORT) 160-4.5 MCG/ACT inhaler Inhale 2 puffs into the lungs 2 (two) times daily. 10.2 g 2  . dexamethasone (DECADRON) 4 MG tablet Take 2 tablets (8 mg total) by mouth daily. Start the day after chemotherapy for 2 days. 30 tablet 1  . ferrous sulfate 325 (65 FE) MG EC tablet Take 1 tablet (325 mg total) by mouth 2 (two) times daily with a meal. 60 tablet 3  . folic acid (FOLVITE) 1 MG tablet Take 1 mg by mouth daily.  11  . hydroxychloroquine (PLAQUENIL) 200 MG tablet 1 tab twice a day, 90 days    . Insulin Glargine (BASAGLAR KWIKPEN) 100 UNIT/ML SOPN Inject 0.1 mLs (10 Units total) into the skin every morning.  12  . levofloxacin (LEVAQUIN) 750 MG tablet Take 1 tablet (750 mg total) by mouth daily. 5 tablet 0  . lisinopril (PRINIVIL,ZESTRIL) 40 MG tablet Take 40 mg by mouth daily.  3  . metoprolol tartrate (LOPRESSOR) 25 MG tablet Take 25 mg by mouth 2 (two) times daily.  3  . mirtazapine (REMERON) 7.5 MG tablet Take 1 tablet (7.5 mg total) by mouth at bedtime. 30 tablet 3  . NARCAN 4 MG/0.1ML LIQD nasal spray kit  CALL 911. ADMINISTER A SINGLE SPRAY OF NARCAN IN ONE NOSTRIL. REPEAT EVERY 3 MINUTES AS NEEDED IF NO OR MINIMAL RESPONSE.    Marland Kitchen nystatin (MYCOSTATIN) 100000 UNIT/ML suspension Take 5 mLs (500,000 Units total) by mouth 4 (four) times daily. 473 mL 0  . omeprazole (PRILOSEC) 20 MG capsule Take 1 capsule (20 mg total) by mouth daily. 90 capsule 0  . ondansetron (ZOFRAN) 8 MG tablet Take 1 tablet (8 mg total) by mouth 2 (two) times daily as needed for refractory nausea / vomiting. Start on day 3 after chemo. 30 tablet 2  . oxyCODONE (OXYCONTIN) 10 mg 12 hr tablet Take 1 tablet (10 mg total) by mouth every 12 (twelve) hours. 14 tablet 0  . Oxycodone HCl 10 MG TABS Take 1 tablet (10 mg total) by mouth every 6 (six) hours as needed. 30 tablet 0  . predniSONE (DELTASONE) 5 MG tablet Take 1 tablet (5 mg total) by mouth daily with breakfast. 30 tablet 0  . prochlorperazine (COMPAZINE) 10 MG tablet Take 1 tablet (10 mg total) by mouth every 6 (six) hours as needed (Nausea or vomiting). 30 tablet 1  . triamterene-hydrochlorothiazide (DYAZIDE) 37.5-25 MG capsule Take 1 capsule by mouth daily.  3  . feeding supplement, ENSURE ENLIVE, (ENSURE ENLIVE) LIQD Take 237 mLs by mouth 2 (two) times daily between meals. (Patient not taking: Reported on 11/29/2018) 237 mL 12  . sucralfate (CARAFATE) 1 g tablet Take 1 tablet (1 g total) by mouth 3 (three) times daily for 5 days. Dissolve in 3-4 tbsp warm water, swish and swallow 15 tablet 0   No current facility-administered medications on file prior to visit.     Allergies  Allergen Reactions  . Ibuprofen Other (See Comments)    Is not supposed to take because of her kidneys.  . Penicillins Other (See Comments)    Has patient had a PCN reaction causing immediate rash, facial/tongue/throat swelling, SOB or lightheadedness with hypotension: Unknown Has patient had a PCN reaction causing severe rash involving mucus membranes or skin necrosis: Unknown Has patient had a PCN  reaction that required hospitalization: Unknown Has patient had a PCN reaction occurring within the last 10  years: Unknown If all of the above answers are "NO", then may proceed with Cephalosporin use.       Observations/Objective: There were no vitals filed for this visit. There is no height or weight on file to calculate BMI.   CBC    Component Value Date/Time   WBC 9.7 11/22/2018 0346   RBC 2.54 (L) 11/22/2018 0346   HGB 7.6 (L) 11/22/2018 0346   HGB 11.2 (L) 02/12/2014 0347   HCT 25.3 (L) 11/22/2018 0346   HCT 33.7 (L) 02/12/2014 0347   PLT 372 11/22/2018 0346   PLT 404 02/12/2014 0347   MCV 99.6 11/22/2018 0346   MCV 91 02/12/2014 0347   MCH 29.9 11/22/2018 0346   MCHC 30.0 11/22/2018 0346   RDW 18.2 (H) 11/22/2018 0346   RDW 13.1 02/12/2014 0347   LYMPHSABS 0.6 (L) 11/07/2018 0928   LYMPHSABS 2.9 02/12/2014 0347   MONOABS 0.8 11/07/2018 0928   MONOABS 1.2 (H) 02/12/2014 0347   EOSABS 0.2 11/07/2018 0928   EOSABS 0.2 02/12/2014 0347   BASOSABS 0.0 11/07/2018 0928   BASOSABS 0.1 02/12/2014 0347    CMP     Component Value Date/Time   NA 135 11/22/2018 0346   NA 133 (L) 02/09/2014 0526   K 4.9 11/22/2018 0346   K 4.2 02/09/2014 0526   CL 107 11/22/2018 0346   CL 102 02/09/2014 0526   CO2 23 11/22/2018 0346   CO2 26 02/09/2014 0526   GLUCOSE 61 (L) 11/22/2018 0346   GLUCOSE 220 (H) 02/09/2014 0526   BUN 22 11/22/2018 0346   BUN 11 02/09/2014 0526   CREATININE 0.78 11/22/2018 0346   CREATININE 0.78 02/09/2014 0526   CALCIUM 8.6 (L) 11/22/2018 0346   CALCIUM 8.4 (L) 02/09/2014 0526   PROT 7.4 11/20/2018 1948   PROT 7.6 02/08/2014 1034   ALBUMIN 2.1 (L) 11/20/2018 1948   ALBUMIN 2.9 (L) 02/08/2014 1034   AST 22 11/20/2018 1948   AST 16 02/08/2014 1034   ALT 14 11/20/2018 1948   ALT 12 02/08/2014 1034   ALKPHOS 76 11/20/2018 1948   ALKPHOS 113 02/08/2014 1034   BILITOT 0.3 11/20/2018 1948   BILITOT 0.3 02/08/2014 1034   GFRNONAA >60 11/22/2018 0346    GFRNONAA >60 02/09/2014 0526   GFRAA >60 11/22/2018 0346   GFRAA >60 02/09/2014 0526     Assessment and Plan: 1. Shortness of breath   2. Malignant neoplasm of lung, unspecified laterality, unspecified part of lung ([place])   3. Chronic narcotic use   4. Rheumatoid arthritis, involving unspecified site, unspecified rheumatoid factor presence ([place])   5. Anxiety     #Acute on chronic shortness of breath Shortness of breath can be multifactorial due to infection, anemia, anxiety etc.   As patient had exposure to COVID-19 recently, she is currently on quarantine and cannot come to our outpatient cancer center facility to have labs or be examined in person by me  With her worsened shortness of breath, increased demand of home oxygen, and recent COVID-19 exposure, I am concerned. I recommend patient to go to emergency room for further evaluation including labs, chest x-ray and CT if indicated for further management, recommend testing COVID-19 given her recent exposure and her immune compromised state. Patient voices understanding and she will go to the ER for further evaluation. I have called ER and talk to ER triage nurse.  #Chronic generalized joint pain due to rheumatoid arthritis which may have flared up due to recent immunotherapy treatment.  It seems that OxyContin has offered some relief for her pain, though she still complains of 6 out of 10 pain. Once her respiratory status stabilized, if she is being released from ER today, I would increase her OxyContin dosage and refill her oxycodone.  Her insurance only allows 1 week supply at one time.  I will refill her Symbicort and albuterol. 3:45pm, daughter Audelia Acton called, returned phone call and she did not pick up 3;50pm called daughter Theadora Rama and update her about patient's condition and recommendation.    Follow Up Instructions: Patient is going to ED for further management. Follow-up appointment to be determined.  I discussed the  assessment and treatment plan with the patient. The patient was provided an opportunity to ask questions and all were answered. The patient agreed with the plan and demonstrated an understanding of the instructions.  The patient was advised to call back or seek an in-person evaluation if the symptoms worsen or if the condition fails to improve as anticipated.   I provided 28 minutes of non face-to-face telephone visit time during this encounter, and > 50% was spent counseling as documented under my assessment & plan.  Earlie Server, MD 11/29/2018 12:14 PM

## 2018-11-29 NOTE — ED Notes (Signed)
Pt taken to CT.

## 2018-11-30 ENCOUNTER — Encounter: Payer: Medicaid Other | Admitting: Internal Medicine

## 2018-11-30 LAB — BASIC METABOLIC PANEL
Anion gap: 7 (ref 5–15)
BUN: 17 mg/dL (ref 8–23)
CO2: 23 mmol/L (ref 22–32)
Calcium: 8.9 mg/dL (ref 8.9–10.3)
Chloride: 108 mmol/L (ref 98–111)
Creatinine, Ser: 0.71 mg/dL (ref 0.44–1.00)
GFR calc Af Amer: >60 mL/min (ref >60)
GFR calc non Af Amer: >60 mL/min (ref >60)
Glucose, Bld: 76 mg/dL (ref 70–99)
Potassium: 4.2 mmol/L (ref 3.5–5.1)
Sodium: 138 mmol/L (ref 135–145)

## 2018-11-30 LAB — CBC
HCT: 26.9 % — ABNORMAL LOW (ref 36.0–46.0)
Hemoglobin: 7.9 g/dL — ABNORMAL LOW (ref 12.0–15.0)
MCH: 29.5 pg (ref 26.0–34.0)
MCHC: 29.4 g/dL — ABNORMAL LOW (ref 30.0–36.0)
MCV: 100.4 fL — ABNORMAL HIGH (ref 80.0–100.0)
Platelets: 424 10*3/uL — ABNORMAL HIGH (ref 150–400)
RBC: 2.68 MIL/uL — ABNORMAL LOW (ref 3.87–5.11)
RDW: 16.3 % — ABNORMAL HIGH (ref 11.5–15.5)
WBC: 14.1 10*3/uL — ABNORMAL HIGH (ref 4.0–10.5)
nRBC: 0.1 % (ref 0.0–0.2)

## 2018-11-30 LAB — GLUCOSE, CAPILLARY
Glucose-Capillary: 78 mg/dL (ref 70–99)
Glucose-Capillary: 62 mg/dL — ABNORMAL LOW (ref 70–99)
Glucose-Capillary: 111 mg/dL — ABNORMAL HIGH (ref 70–99)
Glucose-Capillary: 63 mg/dL — ABNORMAL LOW (ref 70–99)
Glucose-Capillary: 89 mg/dL (ref 70–99)
Glucose-Capillary: 121 mg/dL — ABNORMAL HIGH (ref 70–99)

## 2018-11-30 MED ORDER — SENNOSIDES-DOCUSATE SODIUM 8.6-50 MG PO TABS
1.0000 | ORAL_TABLET | Freq: Two times a day (BID) | ORAL | Status: DC | PRN
  Administered 2018-11-30: 12:00:00 1 via ORAL
  Administered 2018-11-30: 2 via ORAL
  Administered 2018-12-01: 11:00:00 1 via ORAL
  Filled 2018-11-30: qty 2
  Filled 2018-11-30: qty 1
  Filled 2018-11-30: qty 2

## 2018-11-30 MED ORDER — ORAL CARE MOUTH RINSE
15.0000 mL | Freq: Two times a day (BID) | OROMUCOSAL | Status: DC
  Administered 2018-11-30 – 2018-12-03 (×7): 15 mL via OROMUCOSAL

## 2018-11-30 MED ORDER — SORBITOL 70 % SOLN
960.0000 mL | TOPICAL_OIL | Freq: Once | ORAL | Status: AC
  Administered 2018-11-30: 960 mL via RECTAL
  Filled 2018-11-30 (×2): qty 473

## 2018-11-30 MED ORDER — POLYETHYLENE GLYCOL 3350 17 G PO PACK: 17.0000 g | PACK | Freq: Every day | ORAL | Status: DC

## 2018-11-30 MED ORDER — POLYETHYLENE GLYCOL 3350 17 G PO PACK
17.0000 g | PACK | Freq: Every day | ORAL | Status: DC
  Administered 2018-11-30 – 2018-12-03 (×5): 17 g via ORAL
  Filled 2018-11-30 (×5): qty 1

## 2018-11-30 NOTE — Progress Notes (Signed)
[place] [facility] [place] NAME: Tamara Parrish    MR#:  [id number]  DATE OF BIRTH:  [birth year]  SUBJECTIVE:  CHIEF COMPLAINT:   Chief Complaint  Patient presents with  . Shortness of Breath  Patient seen and evaluated today Has generalized weakness Has cough productive of phlegm No fever On oxygen via nasal cannula at 2 to 3 L  REVIEW OF SYSTEMS:    ROS  CONSTITUTIONAL: No documented fever. Has fatigue, weakness. No weight gain, no weight loss.  EYES: No blurry or double vision.  ENT: No tinnitus. No postnasal drip. No redness of the oropharynx.  RESPIRATORY: Has cough, no wheeze, no hemoptysis. Has dyspnea.  CARDIOVASCULAR: No chest pain. No orthopnea. No palpitations. No syncope.  GASTROINTESTINAL: No nausea, no vomiting or diarrhea. No abdominal pain. No melena or hematochezia.  GENITOURINARY: No dysuria or hematuria.  ENDOCRINE: No polyuria or nocturia. No heat or cold intolerance.  HEMATOLOGY: No anemia. No bruising. No bleeding.  INTEGUMENTARY: No rashes. No lesions.  MUSCULOSKELETAL: No arthritis. No swelling. No gout.  NEUROLOGIC: No numbness, tingling, or ataxia. No seizure-type activity.  PSYCHIATRIC: No anxiety. No insomnia. No ADD.   DRUG ALLERGIES:   Allergies  Allergen Reactions  . Ibuprofen Other (See Comments)    Is not supposed to take because of her kidneys.  . Penicillins Other (See Comments)    Has patient had a PCN reaction causing immediate rash, facial/tongue/throat swelling, SOB or lightheadedness with hypotension: Unknown Has patient had a PCN reaction causing severe rash involving mucus membranes or skin necrosis: Unknown Has patient had a PCN reaction that required hospitalization: Unknown Has patient had a PCN reaction occurring within the last 10 years: Unknown If all of the above answers are "NO", then may proceed with Cephalosporin use.    VITALS:  Blood pressure (!) 135/97, pulse (!) 104,  temperature 99.1 F (37.3 C), temperature source Oral, resp. rate (!) 22, height 5\' 6"  (1.676 m), weight 85.5 kg, SpO2 98 %.  PHYSICAL EXAMINATION:   Physical Exam  GENERAL:  63 y.o.-year-old patient lying in the bed on oxygen via nasal cannula EYES: Pupils equal, round, reactive to light and accommodation. No scleral icterus. Extraocular muscles intact.  HEENT: Head atraumatic, normocephalic. Oropharynx and nasopharynx clear.  NECK:  Supple, no jugular venous distention. No thyroid enlargement, no tenderness.  LUNGS: Decreased breath sounds bilaterally, rales heard in right lung. No use of accessory muscles of respiration.  CARDIOVASCULAR: S1, S2 normal. No murmurs, rubs, or gallops.  ABDOMEN: Soft, nontender, nondistended. Bowel sounds present. No organomegaly or mass.  EXTREMITIES: No cyanosis, clubbing or edema b/l.    NEUROLOGIC: Cranial nerves II through XII are intact. No focal Motor or sensory deficits b/l.   PSYCHIATRIC: The patient is alert and oriented x 3.  SKIN: No obvious rash, lesion, or ulcer.   LABORATORY PANEL:   CBC Recent Labs  Lab 11/30/18 0433  WBC 14.1*  HGB 7.9*  HCT 26.9*  PLT 424*   ------------------------------------------------------------------------------------------------------------------ Chemistries  Recent Labs  Lab 11/29/18 1559 11/30/18 0433  NA 136 138  K 5.2* 4.2  CL 105 108  CO2 25 23  GLUCOSE 108* 76  BUN 18 17  CREATININE 0.81 0.71  CALCIUM 9.5 8.9  AST 19  --   ALT 13  --   ALKPHOS 74  --   BILITOT 0.3  --    ------------------------------------------------------------------------------------------------------------------  Cardiac Enzymes Recent Labs  Lab 11/29/18 1559  TROPONINI <0.03   ------------------------------------------------------------------------------------------------------------------  RADIOLOGY:  Ct Chest W Contrast  Result Date: 11/29/2018 CLINICAL DATA:  Shortness of breath and increasing oxygen  requirements. EXAM: CT CHEST WITH CONTRAST TECHNIQUE: Multidetector CT imaging of the chest was performed during intravenous contrast administration. CONTRAST:  71mL OMNIPAQUE IOHEXOL 300 MG/ML  SOLN COMPARISON:  11/07/2018 FINDINGS: Cardiovascular: The heart size is normal. No substantial pericardial effusion. Coronary artery calcification is evident. Atherosclerotic calcification is noted in the wall of the thoracic aorta. Right Port-A-Cath tip is positioned in the mid SVC. Main pulmonary arteries are enlarged. Mediastinum/Nodes: 8 mm short axis subcarinal lymph node evident. Precarinal lymph node measures up to 11 mm short axis. Multiple small nodes are seen in the prevascular space. There is no axillary lymphadenopathy. Lungs/Pleura: The central tracheobronchial airways are patent. Centrilobular and paraseptal emphysema evident. volume loss left hemithorax is similar to prior. Continued progression of interstitial and airspace disease in the left upper lobe surrounding the cavitary mass in this patient with known lung cancer.Interval progression of interstitial and irregular, ill-defined airspace disease in the left base with peripheral predominance. Areas of subpleural reticulation in the right lung are similar to prior. Upper Abdomen: Unremarkable Musculoskeletal: No worrisome lytic or sclerotic osseous abnormality. Old left rib fracture evident. IMPRESSION: 1. Volume loss left hemithorax with similar appearance of cavitary left upper lobe pulmonary lesion in this patient with known lung cancer. Since 11/07/2018, there is been progression of interstitial and ill-defined airspace disease in the left lower lobe which may represent metastatic progression or infectious etiology. 2. Borderline to mild mediastinal lymphadenopathy. Metastatic disease a concern. 3. Enlargement of the main pulmonary arteries compatible with pulmonary arterial hypertension. 4.  Aortic Atherosclerois (ICD10-170.0) 5.  Emphysema.  ([id number]-K93.9) Electronically Signed   By: Misty Stanley M.D.   On: 11/29/2018 18:55   Dg Chest Port 1 View  Result Date: 11/29/2018 CLINICAL DATA:  Shortness of breath.  Exposure to COVID-19. EXAM: PORTABLE CHEST 1 VIEW COMPARISON:  Chest x-rays dated 11/20/2018 and 11/07/2018 and chest CTs dated 11/07/2018 and 10/04/2018 FINDINGS: There is a persistent extensive infiltrate in the left lung superimposed on chronic changes including a cavitary lesion in the left midzone laterally. Overall heart size is normal. Aortic atherosclerosis. Power port in good position. Right lung is clear. No acute bone abnormality. IMPRESSION: No change in the appearance of the chest since the prior study of 11/20/2018. Persistent extensive infiltrate on the left superimposed on chronic changes demonstrated on the prior CT scans. Aortic Atherosclerosis (ICD10-I70.0). Electronically Signed   By: Lorriane Shire M.D.   On: 11/29/2018 16:06     ASSESSMENT AND PLAN:   63 year old female patient with history of lung cancer, metastatic, type 2 diabetes mellitus, hyperlipidemia, hypertension, osteomyelitis, rheumatoid arthritis currently under hospitalist service for dyspnea  -Sepsis secondary to pneumonia Continue IV antibiotics and follow-up cultures Follow-up lactic acid levels  -Healthcare associated pneumonia On broad-spectrum IV antibiotics Patient currently on IV vancomycin and IV aztreonam antibiotics Rule out COVID-19 infection Patient currently on airborne and contact precautions COVID-19 infection test pending  -Hypoxia secondary to pneumonia Continue oxygen via nasal cannula  -History of rheumatoid arthritis Continue home meds  -Type 2 diabetes mellitus Diabetic diet with sliding scale coverage with insulin  -DVT prophylaxis subcu Lovenox daily  All the records are reviewed and case discussed with Care Management/Social Worker. Management plans discussed with the patient, family and they are in  agreement.  CODE STATUS: Full code  DVT Prophylaxis: SCDs  TOTAL TIME TAKING CARE OF THIS PATIENT: 36 minutes.  POSSIBLE D/C IN  2 to 3 DAYS, DEPENDING ON CLINICAL CONDITION.  Saundra Shelling M.D on 11/30/2018 at 11:47 AM  Between 7am to 6pm - Pager - [phone]  After 6pm go to [email or web address] - password [email or web address] [facility]  Office  [phone]  CC: Primary care physician; Glendon Axe, MD  Note: This dictation was prepared with Dragon dictation along with smaller phrase technology. Any transcriptional errors that result from this process are unintentional.

## 2018-11-30 NOTE — Plan of Care (Signed)
  Problem: Activity: Goal: Risk for activity intolerance will decrease Outcome: Not Progressing Note:  Transferring from bed to [facility] or chair is tolerated fairly, but results in dyspnea.     Problem: Nutrition: Goal: Adequate nutrition will be maintained Outcome: Not Progressing Note:  Poor nutritional and fluid intake exacerbating pt's constipation.   Problem: Elimination: Goal: Will not experience complications related to bowel motility Outcome: Not Progressing Note:  Experiencing constipation.  Pt reports her last BM was 1.5 weeks ago.  Administered stool softners and laxatives.   Problem: Education: Goal: Knowledge of General Education information will improve Description Including pain rating scale, medication(s)/side effects and non-pharmacologic comfort measures Outcome: Progressing   Problem: Health Behavior/Discharge Planning: Goal: Ability to manage health-related needs will improve Outcome: Progressing   Problem: Clinical Measurements: Goal: Ability to maintain clinical measurements within normal limits will improve Outcome: Progressing Goal: Will remain free from infection Outcome: Progressing Goal: Diagnostic test results will improve Outcome: Progressing Goal: Respiratory complications will improve Outcome: Progressing Goal: Cardiovascular complication will be avoided Outcome: Progressing   Problem: Coping: Goal: Level of anxiety will decrease Outcome: Progressing   Problem: Elimination: Goal: Will not experience complications related to urinary retention Outcome: Progressing   Problem: Pain Managment: Goal: General experience of comfort will improve Outcome: Progressing   Problem: Safety: Goal: Ability to remain free from injury will improve Outcome: Progressing   Problem: Skin Integrity: Goal: Risk for impaired skin integrity will decrease Outcome: Progressing   Problem: Activity: Goal: Ability to tolerate increased activity will  improve Outcome: Progressing   Problem: Clinical Measurements: Goal: Ability to maintain a body temperature in the normal range will improve Outcome: Progressing   Problem: Respiratory: Goal: Ability to maintain adequate ventilation will improve Outcome: Progressing Goal: Ability to maintain a clear airway will improve Outcome: Progressing

## 2018-11-30 NOTE — Progress Notes (Signed)
Pt c/o constipation, pt is very impacted, this nurse tried to disimpact pt, I was able to get a small amount of feces out, pt could not tolerate it well, pt is in pain and tearful. Pt denies having a BM in 1.5 weeks. Senokot given earlier and pt requested Miralax which was also given, MD notified. Will continue to monitor.

## 2018-11-30 NOTE — Progress Notes (Signed)
 This encounter was created in error - please disregard.

## 2018-12-01 ENCOUNTER — Telehealth: Payer: Self-pay | Admitting: *Deleted

## 2018-12-01 LAB — BASIC METABOLIC PANEL
Anion gap: 8 (ref 5–15)
BUN: 12 mg/dL (ref 8–23)
CO2: 25 mmol/L (ref 22–32)
Calcium: 8.3 mg/dL — ABNORMAL LOW (ref 8.9–10.3)
Chloride: 104 mmol/L (ref 98–111)
Creatinine, Ser: 0.72 mg/dL (ref 0.44–1.00)
GFR calc Af Amer: >60 mL/min (ref >60)
GFR calc non Af Amer: >60 mL/min (ref >60)
Glucose, Bld: 102 mg/dL — ABNORMAL HIGH (ref 70–99)
Potassium: 4 mmol/L (ref 3.5–5.1)
Sodium: 137 mmol/L (ref 135–145)

## 2018-12-01 LAB — GLUCOSE, CAPILLARY
Glucose-Capillary: 93 mg/dL (ref 70–99)
Glucose-Capillary: 89 mg/dL (ref 70–99)
Glucose-Capillary: 118 mg/dL — ABNORMAL HIGH (ref 70–99)
Glucose-Capillary: 138 mg/dL — ABNORMAL HIGH (ref 70–99)

## 2018-12-01 LAB — CBC
HCT: 24.3 % — ABNORMAL LOW (ref 36.0–46.0)
Hemoglobin: 7.4 g/dL — ABNORMAL LOW (ref 12.0–15.0)
MCH: 29.7 pg (ref 26.0–34.0)
MCHC: 30.5 g/dL (ref 30.0–36.0)
MCV: 97.6 fL (ref 80.0–100.0)
Platelets: 333 10*3/uL (ref 150–400)
RBC: 2.49 MIL/uL — ABNORMAL LOW (ref 3.87–5.11)
RDW: 16.2 % — ABNORMAL HIGH (ref 11.5–15.5)
WBC: 16.8 10*3/uL — ABNORMAL HIGH (ref 4.0–10.5)
nRBC: 0 % (ref 0.0–0.2)

## 2018-12-01 MED ORDER — ADULT MULTIVITAMIN W/MINERALS CH
1.0000 | ORAL_TABLET | Freq: Every day | ORAL | Status: DC
  Administered 2018-12-01 – 2018-12-04 (×4): 1 via ORAL
  Filled 2018-12-01 (×4): qty 1

## 2018-12-01 MED ORDER — ENSURE ENLIVE PO LIQD
237.0000 mL | Freq: Two times a day (BID) | ORAL | Status: DC
  Administered 2018-12-02 – 2018-12-03 (×4): 237 mL via ORAL

## 2018-12-01 MED ORDER — LEVOFLOXACIN IN D5W 750 MG/150ML IV SOLN
750.0000 mg | INTRAVENOUS | Status: DC
  Administered 2018-12-01 – 2018-12-03 (×3): 750 mg via INTRAVENOUS
  Filled 2018-12-01 (×4): qty 150

## 2018-12-01 NOTE — Telephone Encounter (Signed)
I called patient's daughter and updated her after discussion with patient's hospitalist Dr.Pyreddy.

## 2018-12-01 NOTE — Progress Notes (Signed)
Initial Nutrition Assessment  RD working remotely.  DOCUMENTATION CODES:   Obesity unspecified  INTERVENTION:   Provide Ensure Enlive po BID, each supplement provides 350 kcal and 20 grams of protein.  MVI daily   NUTRITION DIAGNOSIS:   Inadequate oral intake related to acute illness as evidenced by other (comment)(per chart review ).  GOAL:   Patient will meet greater than or equal to 90% of their needs  MONITOR:   PO intake, Supplement acceptance, Labs, Weight trends, Skin, I & O's  REASON FOR ASSESSMENT:   Malnutrition Screening Tool    ASSESSMENT:   63 year old female patient with history of lung cancer, metastatic, type 2 diabetes mellitus, hyperlipidemia, hypertension, osteomyelitis, rheumatoid arthritis admitted for HCAP   Pt familiar to nutrition department from recent admit. Pt with poor appetite and oral intake since developing PNA. Pt was eating 100% of meals prior to discharge on 3/30.  Pt is amendable to drinking supplements. Per chart, pt appears fairly weight stable pta.   Medications reviewed and include: aspirin, insulin, lovenox, protonix, miralax  Labs reviewed: wbc- 16.8(H), Hgb 7.4(L), Hct 24.3(L)  NUTRITION - FOCUSED PHYSICAL EXAM:  Unable to complete at this time.  Diet Order:   Diet Order            Diet Carb Modified Fluid consistency: Thin; Room service appropriate? Yes  Diet effective now             EDUCATION NEEDS:   Not appropriate for education at this time  Skin:  Skin Assessment: Reviewed RN Assessment  Last BM:  4/9- TYPE 7  Height:   Ht Readings from Last 1 Encounters:  11/29/18 5\' 6"  (1.676 m)   Weight:   Wt Readings from Last 1 Encounters:  11/29/18 85.5 kg   Ideal Body Weight:  59.1 kg  BMI:  Body mass index is 30.42 kg/m.  Estimated Nutritional Needs:   Kcal:  1800-2100kcal/day   Protein:  95-105g/day   Fluid:  >1.7L/day   Koleen Distance MS, RD, LDN Pager #- [phone] Office#-  [phone] After Hours Pager: [phone]

## 2018-12-01 NOTE — TOC Initial Note (Signed)
Transition of Care [facility]) - Initial/Assessment Note    Patient Details  Name: Tamara Parrish MRN: [id number] Date of Birth: 1956/03/13  Transition of Care [facility]) CM/SW Contact:    Ross Ludwig, LCSW Phone Number: 12/01/2018, 12:51 PM  Clinical Narrative:       Patient is a 63 year old female who is alert and oriented x4.  Patient is from home with her daughter living in an apartment.  Patient is active with home health, she is using [facility].  Patient states she is receiving home health PT, nursing, she is requesting a Education officer, museum to follow.  Patient expressed she is pleased with the home health agency and wants to continue with them.  Patient states she does not have any difficulties getting her medications, and her daughter transports her to the appointments.  Patient is a pending Covid test, and results are still pending.  Patient would like to continue with her current home health agency once she discharges from the hospital.               Expected Discharge Plan: [place] Barriers to Discharge: Continued Medical Work up   Patient Goals and CMS Choice Patient states their goals for this hospitalization and ongoing recovery are:: Patient wants to return back home with home health. CMS Medicare.gov Compare Post Acute Care list provided to:: Patient Choice offered to / list presented to : Patient  Expected Discharge Plan and Services Expected Discharge Plan: [place] In-house Referral: Clinical Social Work Discharge Planning Services: CM Consult Post Acute Care Choice: Home Health, Resumption of Svcs/PTA Provider Living arrangements for the past 2 months: Single Family Home                     HH Arranged: RN, PT, Nurse's Aide, Social Work [employer] Agency: [facility] [employer] [facility] [employer]  Prior Living Arrangements/Services Living arrangements for the past 2 months: [employer] Housing with:: Adult  Children Patient language and need for interpreter reviewed:: No Do you feel safe going back to the place where you live?: Yes      Need for Family Participation in Patient Care: No (Comment) Care giver support system in place?: Yes (comment) Current home services: Home PT, Home RN Criminal Activity/Legal Involvement Pertinent to Current Situation/Hospitalization: No - Comment as needed  Activities of Daily Living Home Assistive Devices/Equipment: Cane (specify quad or straight), Walker (specify type) ADL Screening (condition at time of admission) Patient's cognitive ability adequate to safely complete daily activities?: Yes Is the patient deaf or have difficulty hearing?: No Does the patient have difficulty seeing, even when wearing glasses/contacts?: No Does the patient have difficulty concentrating, remembering, or making decisions?: No Patient able to express need for assistance with ADLs?: Yes Does the patient have difficulty dressing or bathing?: No Independently performs ADLs?: No Communication: Needs assistance Is this a change from baseline?: Pre-admission baseline Dressing (OT): Independent Grooming: Independent Feeding: Independent Bathing: Independent Toileting: Independent In/Out Bed: Independent with device (comment) Walks in Home: Independent with device (comment) Does the patient have difficulty walking or climbing stairs?: Yes Weakness of Legs: Both Weakness of Arms/Hands: None  Permission Sought/Granted Permission sought to share information with : Case Manager, Customer service manager, Family Supports Permission granted to share information with : Yes, Verbal Permission Granted  Share Information with NAME: Wende Neighbors  Permission granted to share info w AGENCY: [facility]  Permission granted  to share info w Relationship: Patient's daughter     Emotional Assessment Appearance:: Appears older than stated age   Affect (typically observed):  Accepting, Pleasant Orientation: : Oriented to Self, Oriented to Place, Oriented to  Time, Oriented to Situation Alcohol / Substance Use: Not Applicable Psych Involvement: No (comment)  Admission diagnosis:  Hypoxia [R09.02] HCAP (healthcare-associated pneumonia) [J18.9] Exposure to Covid-19 Virus [Z20.828] Patient Active Problem List   Diagnosis Date Noted  . Sepsis ([place]) 11/29/2018  . Suspected Covid-19 Virus Infection 11/29/2018  . Palliative care encounter   . HCAP (healthcare-associated pneumonia) 11/20/2018  . Dehydration 11/02/2018  . Anemia 10/26/2018  . Encounter for antineoplastic chemotherapy 07/12/2018  . Rheumatoid arthritis ([place]) 07/12/2018  . Thrombocytosis ([place]) 07/12/2018  . Goals of care, counseling/discussion 05/25/2018  . Malignant neoplasm of lung ([place]) 05/25/2018  . Recurrent pneumonia 02/21/2018  . PNA (pneumonia) 02/07/2018  . Elevated erythrocyte sedimentation rate 12/16/2017  . Dry cough 08/19/2017  . Rheumatoid nodule of multiple sites ([place]) 08/19/2017  . Polyneuropathy associated with underlying disease ([place]) 05/17/2017  . Seasonal allergic rhinitis due to pollen 05/17/2017  . Essential hypertension 10/09/2016  . Pure hypercholesterolemia 10/09/2016  . Rheumatoid arthritis, seropositive ([place]) 03/27/2016  . Polyarthralgia 03/18/2016  . Diabetes mellitus type 2, uncomplicated ([place]) 84/07/8207  . Allergic rhinitis 05/18/2014  . Microalbuminuria 04/18/2014   PCP:  Glendon Axe, MD Pharmacy:   [id number] - [place], [address] [place] [address] Phone: [phone] Fax: [phone]     Social Determinants of Health (SDOH) Interventions    Readmission Risk Interventions Readmission Risk Prevention Plan 12/01/2018  Transportation Screening Complete  Medication Review Press photographer) Complete  PCP or Specialist appointment within 3-5 days of discharge Complete  HRI or [place] Complete   SW Recovery Care/Counseling Consult Complete  [place] Not Applicable  Some recent data might be hidden

## 2018-12-01 NOTE — Telephone Encounter (Signed)
Daughter Theadora Rama called stating patient has been admitte to hospital and wants an update on patient . Please return her call [phone]

## 2018-12-01 NOTE — Progress Notes (Signed)
Ch spoke w/ pt via telephone to see how ell she was progressing. Pt shared that she was just d/c from the hospital last week and had to be readmitted due to having pneumonia and testing for CV-19. Ch practiced active listening and confirmed that pt was in contact w/ family. Pt was appreciative of call.    12/01/18 1400  Clinical Encounter Type  Visited With Patient  Visit Type Psychological support;Spiritual support;Social support  Spiritual Encounters  Spiritual Needs Emotional;Grief support  Stress Factors  Patient Stress Factors Major life changes;Loss of control;Health changes

## 2018-12-01 NOTE — Plan of Care (Signed)
  Problem: Nutrition: Goal: Adequate nutrition will be maintained Outcome: Not Progressing Note:  Poor nutritional intake.  Declined Ensure supplements. Promoted PO fluid intake.   Problem: Elimination: Goal: Will not experience complications related to bowel motility Outcome: Not Progressing Note:  Continues to experience constipation, but appears more comfortable than yesterday.     Problem: Education: Goal: Knowledge of General Education information will improve Description Including pain rating scale, medication(s)/side effects and non-pharmacologic comfort measures Outcome: Progressing   Problem: Health Behavior/Discharge Planning: Goal: Ability to manage health-related needs will improve Outcome: Progressing   Problem: Clinical Measurements: Goal: Ability to maintain clinical measurements within normal limits will improve Outcome: Progressing Goal: Will remain free from infection Outcome: Progressing Goal: Diagnostic test results will improve Outcome: Progressing Goal: Respiratory complications will improve Outcome: Progressing Goal: Cardiovascular complication will be avoided Outcome: Progressing   Problem: Activity: Goal: Risk for activity intolerance will decrease Outcome: Progressing   Problem: Coping: Goal: Level of anxiety will decrease Outcome: Progressing   Problem: Elimination: Goal: Will not experience complications related to urinary retention Outcome: Progressing   Problem: Pain Managment: Goal: General experience of comfort will improve Outcome: Progressing   Problem: Safety: Goal: Ability to remain free from injury will improve Outcome: Progressing   Problem: Skin Integrity: Goal: Risk for impaired skin integrity will decrease Outcome: Progressing   Problem: Activity: Goal: Ability to tolerate increased activity will improve Outcome: Progressing   Problem: Clinical Measurements: Goal: Ability to maintain a body temperature in the  normal range will improve Outcome: Progressing   Problem: Respiratory: Goal: Ability to maintain adequate ventilation will improve Outcome: Progressing Goal: Ability to maintain a clear airway will improve Outcome: Progressing

## 2018-12-01 NOTE — Progress Notes (Addendum)
[place] [facility] [place] NAME: Tamara Parrish    MR#:  [id number]  DATE OF BIRTH:  [birth year]  SUBJECTIVE:  CHIEF COMPLAINT:   Chief Complaint  Patient presents with  . Shortness of Breath  Patient seen and evaluated today Has generalized weakness Has decreased cough today No fever On oxygen via nasal cannula at 2 to 3 L  REVIEW OF SYSTEMS:    ROS  CONSTITUTIONAL: No documented fever. Has fatigue, weakness. No weight gain, no weight loss.  EYES: No blurry or double vision.  ENT: No tinnitus. No postnasal drip. No redness of the oropharynx.  RESPIRATORY: Has decreased cough, no wheeze, no hemoptysis. Has dyspnea.  CARDIOVASCULAR: No chest pain. No orthopnea. No palpitations. No syncope.  GASTROINTESTINAL: No nausea, no vomiting or diarrhea. No abdominal pain. No melena or hematochezia.  GENITOURINARY: No dysuria or hematuria.  ENDOCRINE: No polyuria or nocturia. No heat or cold intolerance.  HEMATOLOGY: No anemia. No bruising. No bleeding.  INTEGUMENTARY: No rashes. No lesions.  MUSCULOSKELETAL: No arthritis. No swelling. No gout.  NEUROLOGIC: No numbness, tingling, or ataxia. No seizure-type activity.  PSYCHIATRIC: No anxiety. No insomnia. No ADD.   DRUG ALLERGIES:   Allergies  Allergen Reactions  . Ibuprofen Other (See Comments)    Is not supposed to take because of her kidneys.  . Penicillins Other (See Comments)    Has patient had a PCN reaction causing immediate rash, facial/tongue/throat swelling, SOB or lightheadedness with hypotension: Unknown Has patient had a PCN reaction causing severe rash involving mucus membranes or skin necrosis: Unknown Has patient had a PCN reaction that required hospitalization: Unknown Has patient had a PCN reaction occurring within the last 10 years: Unknown If all of the above answers are "NO", then may proceed with Cephalosporin use.    VITALS:  Blood pressure (!) 147/70, pulse 93,  temperature 98.2 F (36.8 C), temperature source Oral, resp. rate (!) 24, height 5\' 6"  (1.676 m), weight 85.5 kg, SpO2 99 %.  PHYSICAL EXAMINATION:   Physical Exam  GENERAL:  63 y.o.-year-old patient lying in the bed on oxygen via nasal cannula EYES: Pupils equal, round, reactive to light and accommodation. No scleral icterus. Extraocular muscles intact.  HEENT: Head atraumatic, normocephalic. Oropharynx and nasopharynx clear.  NECK:  Supple, no jugular venous distention. No thyroid enlargement, no tenderness.  LUNGS: Decreased breath sounds bilaterally, rales heard in left lung. No use of accessory muscles of respiration.  CARDIOVASCULAR: S1, S2 normal. No murmurs, rubs, or gallops.  ABDOMEN: Soft, nontender, nondistended. Bowel sounds present. No organomegaly or mass.  EXTREMITIES: No cyanosis, clubbing or edema b/l.    NEUROLOGIC: Cranial nerves II through XII are intact. No focal Motor or sensory deficits b/l.   PSYCHIATRIC: The patient is alert and oriented x 3.  SKIN: No obvious rash, lesion, or ulcer.   LABORATORY PANEL:   CBC Recent Labs  Lab 12/01/18 0538  WBC 16.8*  HGB 7.4*  HCT 24.3*  PLT 333   ------------------------------------------------------------------------------------------------------------------ Chemistries  Recent Labs  Lab 11/29/18 1559  12/01/18 0538  NA 136   < > 137  K 5.2*   < > 4.0  CL 105   < > 104  CO2 25   < > 25  GLUCOSE 108*   < > 102*  BUN 18   < > 12  CREATININE 0.81   < > 0.72  CALCIUM 9.5   < > 8.3*  AST 19  --   --  ALT 13  --   --   ALKPHOS 74  --   --   BILITOT 0.3  --   --    < > = values in this interval not displayed.   ------------------------------------------------------------------------------------------------------------------  Cardiac Enzymes Recent Labs  Lab 11/29/18 1559  TROPONINI <0.03    ------------------------------------------------------------------------------------------------------------------  RADIOLOGY:  Ct Chest W Contrast  Result Date: 11/29/2018 CLINICAL DATA:  Shortness of breath and increasing oxygen requirements. EXAM: CT CHEST WITH CONTRAST TECHNIQUE: Multidetector CT imaging of the chest was performed during intravenous contrast administration. CONTRAST:  22mL OMNIPAQUE IOHEXOL 300 MG/ML  SOLN COMPARISON:  11/07/2018 FINDINGS: Cardiovascular: The heart size is normal. No substantial pericardial effusion. Coronary artery calcification is evident. Atherosclerotic calcification is noted in the wall of the thoracic aorta. Right Port-A-Cath tip is positioned in the mid SVC. Main pulmonary arteries are enlarged. Mediastinum/Nodes: 8 mm short axis subcarinal lymph node evident. Precarinal lymph node measures up to 11 mm short axis. Multiple small nodes are seen in the prevascular space. There is no axillary lymphadenopathy. Lungs/Pleura: The central tracheobronchial airways are patent. Centrilobular and paraseptal emphysema evident. volume loss left hemithorax is similar to prior. Continued progression of interstitial and airspace disease in the left upper lobe surrounding the cavitary mass in this patient with known lung cancer.Interval progression of interstitial and irregular, ill-defined airspace disease in the left base with peripheral predominance. Areas of subpleural reticulation in the right lung are similar to prior. Upper Abdomen: Unremarkable Musculoskeletal: No worrisome lytic or sclerotic osseous abnormality. Old left rib fracture evident. IMPRESSION: 1. Volume loss left hemithorax with similar appearance of cavitary left upper lobe pulmonary lesion in this patient with known lung cancer. Since 11/07/2018, there is been progression of interstitial and ill-defined airspace disease in the left lower lobe which may represent metastatic progression or infectious etiology.  2. Borderline to mild mediastinal lymphadenopathy. Metastatic disease a concern. 3. Enlargement of the main pulmonary arteries compatible with pulmonary arterial hypertension. 4.  Aortic Atherosclerois (ICD10-170.0) 5.  Emphysema. ([id number]) Electronically Signed   By: Misty Stanley M.D.   On: 11/29/2018 18:55   Dg Chest Port 1 View  Result Date: 11/29/2018 CLINICAL DATA:  Shortness of breath.  Exposure to COVID-19. EXAM: PORTABLE CHEST 1 VIEW COMPARISON:  Chest x-rays dated 11/20/2018 and 11/07/2018 and chest CTs dated 11/07/2018 and 10/04/2018 FINDINGS: There is a persistent extensive infiltrate in the left lung superimposed on chronic changes including a cavitary lesion in the left midzone laterally. Overall heart size is normal. Aortic atherosclerosis. Power port in good position. Right lung is clear. No acute bone abnormality. IMPRESSION: No change in the appearance of the chest since the prior study of 11/20/2018. Persistent extensive infiltrate on the left superimposed on chronic changes demonstrated on the prior CT scans. Aortic Atherosclerosis (ICD10-I70.0). Electronically Signed   By: Lorriane Shire M.D.   On: 11/29/2018 16:06     ASSESSMENT AND PLAN:   63 year old female patient with history of lung cancer, metastatic, type 2 diabetes mellitus, hyperlipidemia, hypertension, osteomyelitis, rheumatoid arthritis currently under hospitalist service for dyspnea  -Sepsis secondary to pneumonia Improving Cultures negative so far Narrow down abx  -Healthcare associated pneumonia Mrsa pcr negative Discontinue IV Vancomycin and IV Aztreonam abx Start IV levaquin abx Follow up wbc count Flu negative  Rule out COVID-19 infection Patient currently on airborne and contact precautions COVID-19 infection test result pending  -Hypoxia secondary to pneumonia Continue oxygen via nasal cannula  -Lung cancer Appears progressive Could be responsible for  hypoxia Oncology follow up once acute  issues resolve  -History of rheumatoid arthritis Continue home meds  -Type 2 diabetes mellitus Diabetic diet with sliding scale coverage with insulin  -DVT prophylaxis subcu Lovenox daily  All the records are reviewed and case discussed with Care Management/Social Worker. Management plans discussed with the patient, family and they are in agreement.  CODE STATUS: Full code  DVT Prophylaxis: SCDs  TOTAL TIME TAKING CARE OF THIS PATIENT: 35 minutes.   POSSIBLE D/C IN  2 to 3 DAYS, DEPENDING ON CLINICAL CONDITION.  Saundra Shelling M.D on 12/01/2018 at 10:07 AM  Between 7am to 6pm - Pager - [phone]  After 6pm go to [email or web address] - password [email or web address] [facility]  Office  [phone]  CC: Primary care physician; Glendon Axe, MD  Note: This dictation was prepared with Dragon dictation along with smaller phrase technology. Any transcriptional errors that result from this process are unintentional.

## 2018-12-01 NOTE — Consult Note (Addendum)
Pharmacy Antibiotic Note  Tamara Parrish is a 63 y.o. female admitted on 11/29/2018 with pneumonia-HAP.  Pharmacy has been consulted for levofloxacin dosing. Patient was previously on Aztreonam and Vancomycin for 2 days . MRSA PCR was negative. The aforementioned medications have been discontinued at this time by Dr. Estanislado Pandy. Given WBC remains elevated (yet afebrile > 72 hours) and on nasal cannula patient will remain on IV route and reassess need for oral route.   Plan: Start levofloxacin IV 750 mg Q24H.   Height: 5\' 6"  (167.6 cm) Weight: 188 lb 8 oz (85.5 kg) IBW/kg (Calculated) : 59.3  Temp (24hrs), Avg:98.3 F (36.8 C), Min:98.1 F (36.7 C), Max:98.6 F (37 C)  Recent Labs  Lab 11/29/18 1559 11/30/18 0433 12/01/18 0538  WBC 14.1* 14.1* 16.8*  CREATININE 0.81 0.71 0.72  LATICACIDVEN 1.1  --   --     Estimated Creatinine Clearance: 78.3 mL/min (by C-G formula based on SCr of 0.72 mg/dL).    Allergies  Allergen Reactions  . Ibuprofen Other (See Comments)    Is not supposed to take because of her kidneys.  . Penicillins Other (See Comments)    Has patient had a PCN reaction causing immediate rash, facial/tongue/throat swelling, SOB or lightheadedness with hypotension: Unknown Has patient had a PCN reaction causing severe rash involving mucus membranes or skin necrosis: Unknown Has patient had a PCN reaction that required hospitalization: Unknown Has patient had a PCN reaction occurring within the last 10 years: Unknown If all of the above answers are "NO", then may proceed with Cephalosporin use.    Antimicrobials this admission:  4/7 Aztreonam >> 4/8 4/7 Vancomycin >> 4/8 4/9 Levofloxacin >>  Microbiology results: 4/7 BCx: NG x2 days  4/7 MRSA PCR: (-) 4/7 COVID-19: pending   Thank you for allowing pharmacy to be a part of this patient's care.  Rowland Lathe, PharmD 12/01/2018 10:33 AM

## 2018-12-02 LAB — CBC
HCT: 22.9 % — ABNORMAL LOW (ref 36.0–46.0)
Hemoglobin: 6.9 g/dL — ABNORMAL LOW (ref 12.0–15.0)
MCH: 29.6 pg (ref 26.0–34.0)
MCHC: 30.1 g/dL (ref 30.0–36.0)
MCV: 98.3 fL (ref 80.0–100.0)
Platelets: 331 10*3/uL (ref 150–400)
RBC: 2.33 MIL/uL — ABNORMAL LOW (ref 3.87–5.11)
RDW: 16.2 % — ABNORMAL HIGH (ref 11.5–15.5)
WBC: 15.6 10*3/uL — ABNORMAL HIGH (ref 4.0–10.5)
nRBC: 0 % (ref 0.0–0.2)

## 2018-12-02 LAB — GLUCOSE, CAPILLARY
Glucose-Capillary: 94 mg/dL (ref 70–99)
Glucose-Capillary: 244 mg/dL — ABNORMAL HIGH (ref 70–99)
Glucose-Capillary: 138 mg/dL — ABNORMAL HIGH (ref 70–99)
Glucose-Capillary: 100 mg/dL — ABNORMAL HIGH (ref 70–99)

## 2018-12-02 LAB — NOVEL CORONAVIRUS, NAA (HOSP ORDER, SEND-OUT TO REF LAB; TAT 18-24 HRS): SARS-CoV-2, NAA: NOT DETECTED

## 2018-12-02 LAB — BASIC METABOLIC PANEL
Anion gap: 3 — ABNORMAL LOW (ref 5–15)
BUN: 15 mg/dL (ref 8–23)
CO2: 25 mmol/L (ref 22–32)
Calcium: 7.6 mg/dL — ABNORMAL LOW (ref 8.9–10.3)
Chloride: 107 mmol/L (ref 98–111)
Creatinine, Ser: 0.74 mg/dL (ref 0.44–1.00)
GFR calc Af Amer: >60 mL/min (ref >60)
GFR calc non Af Amer: >60 mL/min (ref >60)
Glucose, Bld: 77 mg/dL (ref 70–99)
Potassium: 3.7 mmol/L (ref 3.5–5.1)
Sodium: 135 mmol/L (ref 135–145)

## 2018-12-02 LAB — PREPARE RBC (CROSSMATCH)

## 2018-12-02 MED ORDER — MIRTAZAPINE 15 MG PO TABS
7.5000 mg | ORAL_TABLET | Freq: Every day | ORAL | Status: DC
  Administered 2018-12-02 – 2018-12-03 (×2): 7.5 mg via ORAL
  Filled 2018-12-02 (×2): qty 1

## 2018-12-02 MED ORDER — NYSTATIN 100000 UNIT/ML MT SUSP
5.0000 mL | Freq: Four times a day (QID) | OROMUCOSAL | Status: DC
  Administered 2018-12-02 – 2018-12-04 (×8): 500000 [IU] via ORAL
  Filled 2018-12-02 (×8): qty 5

## 2018-12-02 MED ORDER — SODIUM CHLORIDE 0.9% IV SOLUTION
Freq: Once | INTRAVENOUS | Status: AC
  Administered 2018-12-02: 13:00:00 via INTRAVENOUS

## 2018-12-02 MED ORDER — FOLIC ACID 1 MG PO TABS
1.0000 mg | ORAL_TABLET | Freq: Every day | ORAL | Status: DC
  Administered 2018-12-02 – 2018-12-04 (×3): 1 mg via ORAL
  Filled 2018-12-02 (×3): qty 1

## 2018-12-02 MED ORDER — ATORVASTATIN CALCIUM 20 MG PO TABS
40.0000 mg | ORAL_TABLET | Freq: Every day | ORAL | Status: DC
  Administered 2018-12-02 – 2018-12-03 (×2): 40 mg via ORAL
  Filled 2018-12-02 (×2): qty 2

## 2018-12-02 MED ORDER — FERROUS SULFATE 325 (65 FE) MG PO TABS
325.0000 mg | ORAL_TABLET | Freq: Two times a day (BID) | ORAL | Status: DC
  Administered 2018-12-02 – 2018-12-04 (×4): 325 mg via ORAL
  Filled 2018-12-02 (×4): qty 1

## 2018-12-02 NOTE — Plan of Care (Signed)
  Problem: Coping: Goal: Level of anxiety will decrease 12/02/2018 0041 by Jeri Cos, RN Outcome: Progressing 12/02/2018 0038 by Jeri Cos, RN Outcome: Progressing   Problem: Skin Integrity: Goal: Risk for impaired skin integrity will decrease 12/02/2018 0041 by Jeri Cos, RN Outcome: Progressing 12/02/2018 0038 by Jeri Cos, RN Outcome: Progressing

## 2018-12-02 NOTE — Plan of Care (Signed)
  Problem: Coping: Goal: Level of anxiety will decrease Outcome: Progressing   Problem: Skin Integrity: Goal: Risk for impaired skin integrity will decrease Outcome: Progressing

## 2018-12-02 NOTE — Progress Notes (Signed)
[place] at [place] NAME: Tamara Parrish    MR#:  [id number]  DATE OF BIRTH:  02/01/1956  SUBJECTIVE:  CHIEF COMPLAINT:   Chief Complaint  Patient presents with  . Shortness of Breath  Hb dropped to 6.9, weak REVIEW OF SYSTEMS:    ROS  CONSTITUTIONAL: No documented fever. Has fatigue, weakness. No weight gain, no weight loss.  EYES: No blurry or double vision.  ENT: No tinnitus. No postnasal drip. No redness of the oropharynx.  RESPIRATORY: Has decreased cough, no wheeze, no hemoptysis. Has dyspnea.  CARDIOVASCULAR: No chest pain. No orthopnea. No palpitations. No syncope.  GASTROINTESTINAL: No nausea, no vomiting or diarrhea. No abdominal pain. No melena or hematochezia.  GENITOURINARY: No dysuria or hematuria.  ENDOCRINE: No polyuria or nocturia. No heat or cold intolerance.  HEMATOLOGY: No anemia. No bruising. No bleeding.  INTEGUMENTARY: No rashes. No lesions.  MUSCULOSKELETAL: No arthritis. No swelling. No gout.  NEUROLOGIC: No numbness, tingling, or ataxia. No seizure-type activity.  PSYCHIATRIC: No anxiety. No insomnia. No ADD.  DRUG ALLERGIES:   Allergies  Allergen Reactions  . Ibuprofen Other (See Comments)    Is not supposed to take because of her kidneys.  . Penicillins Other (See Comments)    Has patient had a PCN reaction causing immediate rash, facial/tongue/throat swelling, SOB or lightheadedness with hypotension: Unknown Has patient had a PCN reaction causing severe rash involving mucus membranes or skin necrosis: Unknown Has patient had a PCN reaction that required hospitalization: Unknown Has patient had a PCN reaction occurring within the last 10 years: Unknown If all of the above answers are "NO", then may proceed with Cephalosporin use.    VITALS:  Blood pressure 112/82, pulse 81, temperature 98.2 F (36.8 C), temperature source Oral, resp. rate 16, height 5\' 6"  (1.676 m), weight 85.5 kg, SpO2 98  %.  PHYSICAL EXAMINATION:   Physical Exam  GENERAL:  63 y.o.-year-old patient lying in the bed on oxygen via nasal cannula EYES: Pupils equal, round, reactive to light and accommodation. No scleral icterus. Extraocular muscles intact.  HEENT: Head atraumatic, normocephalic. Oropharynx and nasopharynx clear.  NECK:  Supple, no jugular venous distention. No thyroid enlargement, no tenderness.  LUNGS: Decreased breath sounds bilaterally, rales heard in left lung. No use of accessory muscles of respiration.  CARDIOVASCULAR: S1, S2 normal. No murmurs, rubs, or gallops.  ABDOMEN: Soft, nontender, nondistended. Bowel sounds present. No organomegaly or mass.  EXTREMITIES: No cyanosis, clubbing or edema b/l.    NEUROLOGIC: Cranial nerves II through XII are intact. No focal Motor or sensory deficits b/l.   PSYCHIATRIC: The patient is alert and oriented x 3.  SKIN: No obvious rash, lesion, or ulcer.  LABORATORY PANEL:   CBC Recent Labs  Lab 12/02/18 0614  WBC 15.6*  HGB 6.9*  HCT 22.9*  PLT 331   ------------------------------------------------------------------------------------------------------------------ Chemistries  Recent Labs  Lab 11/29/18 1559  12/02/18 0614  NA 136   < > 135  K 5.2*   < > 3.7  CL 105   < > 107  CO2 25   < > 25  GLUCOSE 108*   < > 77  BUN 18   < > 15  CREATININE 0.81   < > 0.74  CALCIUM 9.5   < > 7.6*  AST 19  --   --   ALT 13  --   --   ALKPHOS 74  --   --   BILITOT 0.3  --   --    < > =  values in this interval not displayed.   ------------------------------------------------------------------------------------------------------------------  Cardiac Enzymes Recent Labs  Lab 11/29/18 1559  TROPONINI <0.03   ------------------------------------------------------------------------------------------------------------------  RADIOLOGY:  No results found.   ASSESSMENT AND PLAN:  63 year old female patient with history of lung cancer,  metastatic, type 2 diabetes mellitus, hyperlipidemia, hypertension, osteomyelitis, rheumatoid arthritis currently under hospitalist service for dyspnea  *Sepsis: present on admission secondary to pneumonia Improving Cultures negative so far On IV Levaquin  -Healthcare associated pneumonia Mrsa pcr negative Discontinue IV Vancomycin and IV Aztreonam abx - continue IV levaquin abx Follow up wbc count Flu negative  * Anemia likely of chronic dz - has underlying malignancy - no obvious bleeding - Hb 6.9 - 1 PRBC ordered  Rule out COVID-19 infection Patient currently on airborne and contact precautions COVID-19 infection test result pending  -Hypoxia secondary to pneumonia Continue oxygen via nasal cannula and wean as tolerated  -Lung cancer Appears progressive Could be responsible for hypoxia Oncology follow up once acute issues resolve  -History of rheumatoid arthritis Continue home meds  -Type 2 diabetes mellitus Diabetic diet with sliding scale coverage with insulin    -DVT prophylaxis subcu Lovenox daily     All the records are reviewed and case discussed with Care Management/Social Worker. Management plans discussed with the patient, nursing and they are in agreement.  CODE STATUS: Full code  DVT Prophylaxis: SCDs  TOTAL TIME TAKING CARE OF THIS PATIENT: 35 minutes.   POSSIBLE D/C IN  2 to 3 DAYS, DEPENDING ON CLINICAL CONDITION.  Max Sane M.D on 12/02/2018 at 4:23 PM  Between 7am to 6pm - Pager - [phone]  After 6pm go to [email or web address] - password [email or web address] [facility]  Office  [phone]  CC: Primary care physician; Glendon Axe, MD  Note: This dictation was prepared with Dragon dictation along with smaller phrase technology. Any transcriptional errors that result from this process are unintentional.

## 2018-12-02 NOTE — Progress Notes (Signed)
Notified MD of hgb of 6.9. Orders placed. Will continue to monitor and assess.

## 2018-12-02 NOTE — Consult Note (Signed)
Pharmacy Antibiotic Note  Tamara Parrish is a 63 y.o. female admitted on 11/29/2018 with pneumonia-HAP.  Pharmacy has been consulted for levofloxacin dosing. Patient was previously on Aztreonam and Vancomycin for 2 days . MRSA PCR was negative. The aforementioned medications have been discontinued. Given WBC remains elevated (yet afebrile > 72 hours) will continue the IV route.   Plan: Continue levofloxacin IV 750 mg Q24H.   Height: 5\' 6"  (167.6 cm) Weight: 188 lb 8 oz (85.5 kg) IBW/kg (Calculated) : 59.3  Temp (24hrs), Avg:98.3 F (36.8 C), Min:97.9 F (36.6 C), Max:98.7 F (37.1 C)  Recent Labs  Lab 11/29/18 1559 11/30/18 0433 12/01/18 0538 12/02/18 0614  WBC 14.1* 14.1* 16.8* 15.6*  CREATININE 0.81 0.71 0.72 0.74  LATICACIDVEN 1.1  --   --   --     Estimated Creatinine Clearance: 78.3 mL/min (by C-G formula based on SCr of 0.74 mg/dL).    Allergies  Allergen Reactions  . Ibuprofen Other (See Comments)    Is not supposed to take because of her kidneys.  . Penicillins Other (See Comments)    Has patient had a PCN reaction causing immediate rash, facial/tongue/throat swelling, SOB or lightheadedness with hypotension: Unknown Has patient had a PCN reaction causing severe rash involving mucus membranes or skin necrosis: Unknown Has patient had a PCN reaction that required hospitalization: Unknown Has patient had a PCN reaction occurring within the last 10 years: Unknown If all of the above answers are "NO", then may proceed with Cephalosporin use.    Antimicrobials this admission:  4/7 Aztreonam >> 4/8 4/7 Vancomycin >> 4/8 4/9 Levofloxacin >>  Microbiology results: 4/7 BCx: NG x 3 days  4/7 MRSA PCR: (-) 4/7 COVID-19: pending   Thank you for allowing pharmacy to be a part of this patient's care.  Rowland Lathe, PharmD 12/02/2018 8:06 AM

## 2018-12-02 NOTE — Plan of Care (Signed)
Attempted x2 to get urine for urinalysis, but patient is incontinent.  Staff will continue to attempt and send if obtained.

## 2018-12-03 ENCOUNTER — Inpatient Hospital Stay: Payer: Medicaid Other

## 2018-12-03 LAB — BASIC METABOLIC PANEL
Anion gap: 5 (ref 5–15)
BUN: 15 mg/dL (ref 8–23)
CO2: 26 mmol/L (ref 22–32)
Calcium: 7.9 mg/dL — ABNORMAL LOW (ref 8.9–10.3)
Chloride: 105 mmol/L (ref 98–111)
Creatinine, Ser: 0.76 mg/dL (ref 0.44–1.00)
GFR calc Af Amer: >60 mL/min (ref >60)
GFR calc non Af Amer: >60 mL/min (ref >60)
Glucose, Bld: 110 mg/dL — ABNORMAL HIGH (ref 70–99)
Potassium: 4.2 mmol/L (ref 3.5–5.1)
Sodium: 136 mmol/L (ref 135–145)

## 2018-12-03 LAB — TYPE AND SCREEN
ABO/RH(D): O POS
Antibody Screen: NEGATIVE
Unit division: 0

## 2018-12-03 LAB — BPAM RBC
Blood Product Expiration Date: 202004152359
ISSUE DATE / TIME: 202004101322
Unit Type and Rh: 9500

## 2018-12-03 LAB — CBC
HCT: 26.1 % — ABNORMAL LOW (ref 36.0–46.0)
Hemoglobin: 8.1 g/dL — ABNORMAL LOW (ref 12.0–15.0)
MCH: 29.3 pg (ref 26.0–34.0)
MCHC: 31 g/dL (ref 30.0–36.0)
MCV: 94.6 fL (ref 80.0–100.0)
Platelets: 339 10*3/uL (ref 150–400)
RBC: 2.76 MIL/uL — ABNORMAL LOW (ref 3.87–5.11)
RDW: 17.8 % — ABNORMAL HIGH (ref 11.5–15.5)
WBC: 17.1 10*3/uL — ABNORMAL HIGH (ref 4.0–10.5)
nRBC: 0.2 % (ref 0.0–0.2)

## 2018-12-03 LAB — GLUCOSE, CAPILLARY
Glucose-Capillary: 127 mg/dL — ABNORMAL HIGH (ref 70–99)
Glucose-Capillary: 119 mg/dL — ABNORMAL HIGH (ref 70–99)
Glucose-Capillary: 177 mg/dL — ABNORMAL HIGH (ref 70–99)
Glucose-Capillary: 142 mg/dL — ABNORMAL HIGH (ref 70–99)

## 2018-12-03 NOTE — Plan of Care (Signed)
  Problem: Clinical Measurements: Goal: Respiratory complications will improve Outcome: Progressing   Problem: Coping: Goal: Level of anxiety will decrease Outcome: Progressing   Problem: Pain Managment: Goal: General experience of comfort will improve Outcome: Progressing

## 2018-12-03 NOTE — Progress Notes (Addendum)
[place] at [place] NAME: Tamara Parrish    MR#:  [id number]  DATE OF BIRTH:  1956-06-30  SUBJECTIVE:  CHIEF COMPLAINT:   Chief Complaint  Patient presents with   Shortness of Breath   Reports of ongoing fevers and chills last night.  REVIEW OF SYSTEMS:  Review of Systems  Constitutional: Positive for chills and fever.  HENT: Negative for sore throat.   Respiratory: Negative for cough, shortness of breath and wheezing.   Cardiovascular: Positive for leg swelling. Negative for chest pain.  Gastrointestinal: Negative for abdominal pain, diarrhea, nausea and vomiting.  Genitourinary: Negative for dysuria.  Musculoskeletal: Negative for back pain and myalgias.  Neurological: Negative for dizziness, sensory change, speech change, focal weakness and headaches.   DRUG ALLERGIES:   Allergies  Allergen Reactions   Ibuprofen Other (See Comments)    Is not supposed to take because of her kidneys.   Penicillins Other (See Comments)    Has patient had a PCN reaction causing immediate rash, facial/tongue/throat swelling, SOB or lightheadedness with hypotension: Unknown Has patient had a PCN reaction causing severe rash involving mucus membranes or skin necrosis: Unknown Has patient had a PCN reaction that required hospitalization: Unknown Has patient had a PCN reaction occurring within the last 10 years: Unknown If all of the above answers are "NO", then may proceed with Cephalosporin use.    VITALS:  Blood pressure (!) 124/59, pulse 96, temperature 98.5 F (36.9 C), temperature source Oral, resp. rate 20, height 5\' 6"  (1.676 m), weight 85.5 kg, SpO2 99 %.  PHYSICAL EXAMINATION:   GENERAL:  63 y.o.-year-old patient lying in the bed with no acute distress.  EYES: Pupils equal, round, reactive to light and accommodation. No scleral icterus. Extraocular muscles intact.  HEENT: Head atraumatic, normocephalic. Oropharynx and nasopharynx  clear.  NECK:  Supple, no jugular venous distention. No thyroid enlargement, no tenderness.  LUNGS: Normal breath sounds bilaterally, no wheezing, rales,rhonchi or crepitation. No use of accessory muscles of respiration.  CARDIOVASCULAR: S1, S2 normal. No murmurs, rubs, or gallops.  ABDOMEN: Soft, nontender, nondistended. Bowel sounds present. No organomegaly or mass.  EXTREMITIES: Bilateral pedal and ankle edema. No  cyanosis, or clubbing.  NEUROLOGIC: Cranial nerves II through XII are intact. Muscle strength 5/5 in all extremities. Sensation intact. Gait not checked.  PSYCHIATRIC: The patient is alert and oriented x 3.  SKIN: No obvious rash, lesion, or ulcer.   LABORATORY PANEL:   CBC Recent Labs  Lab 12/03/18 0442  WBC 17.1*  HGB 8.1*  HCT 26.1*  PLT 339   ------------------------------------------------------------------------------------------------------------------  Chemistries  Recent Labs  Lab 11/29/18 1559  12/03/18 0442  NA 136   < > 136  K 5.2*   < > 4.2  CL 105   < > 105  CO2 25   < > 26  GLUCOSE 108*   < > 110*  BUN 18   < > 15  CREATININE 0.81   < > 0.76  CALCIUM 9.5   < > 7.9*  AST 19  --   --   ALT 13  --   --   ALKPHOS 74  --   --   BILITOT 0.3  --   --    < > = values in this interval not displayed.   ------------------------------------------------------------------------------------------------------------------  Cardiac Enzymes Recent Labs  Lab 11/29/18 1559  TROPONINI <0.03   ------------------------------------------------------------------------------------------------------------------  RADIOLOGY:  No results found.  EKG:  Orders placed or performed during the hospital encounter of 11/29/18   ED EKG 12-Lead   ED EKG 12-Lead    ASSESSMENT AND PLAN:   63 year old female patient with history of lung cancer, metastatic, type 2 diabetes mellitus, hyperlipidemia, hypertension, osteomyelitis, rheumatoid arthritis currently under  hospitalist service for dyspnea  #1. Sepsis: Likely secondary to pneumonia on admission. - Still with fevers and chills. WBC elevated at 17.1 from 15.6 - Obtain chest-xray and UA for further evaluation of possible opportunist infections given her immunosuppression -Cultures negative so far -Continue coverage with IV Levaquin   2. Healthcare associated pneumonia -Mrsa pcr negative. IV Vancomycin and IV Aztreonam abx discontinued -continue IV levaquin abx as above -Chest x-ray as above - Will continue to follow WBC -Flu negative  3. Acute on chronic anemia with hemoglobin of 6.9, status post PRBC transfusion - has underlying malignancy - no obvious bleeding - Hbg improved at 8.1 post transfusion  4. Rule out COVID-19 infection -Patient currently on airborne and contact precautions -COVID-19 infection test result pending -Hypoxia secondary to pneumonia -Continue oxygen via nasal cannula and wean as tolerated  5.Lung cancer -Appears progressive -Could be responsible for hypoxia -Oncology follow up once acute issues resolve  6.History of rheumatoid arthritis Continue home meds  7.Type 2 diabetes mellitus- Well controlled -Diabetic diet with sliding scale coverage with insulin  8. DVT prophylaxis subcu Lovenox daily   All the records are reviewed and case discussed with Care Management/Social Workerr. Management plans discussed with the patient, family and they are in agreement.  CODE STATUS: FULL  TOTAL TIME TAKING CARE OF THIS PATIENT: 35 minutes.   POSSIBLE D/C IN 1 DAYS, DEPENDING ON CLINICAL CONDITION.   Rufina Falco NP on 12/03/2018 at 1:49 PM  Between 7am to 6pm - Pager - [phone]  After 6pm go to [email or web address] - password [email or web address] [facility]  Office  [phone]  CC: Primary care physician; Glendon Axe, MD

## 2018-12-04 LAB — CBC WITH DIFFERENTIAL/PLATELET
Abs Immature Granulocytes: 0.19 10*3/uL — ABNORMAL HIGH (ref 0.00–0.07)
Basophils Absolute: 0 10*3/uL (ref 0.0–0.1)
Basophils Relative: 0 %
Eosinophils Absolute: 0.1 10*3/uL (ref 0.0–0.5)
Eosinophils Relative: 1 %
HCT: 27.7 % — ABNORMAL LOW (ref 36.0–46.0)
Hemoglobin: 8.5 g/dL — ABNORMAL LOW (ref 12.0–15.0)
Immature Granulocytes: 1 %
Lymphocytes Relative: 5 %
Lymphs Abs: 0.8 10*3/uL (ref 0.7–4.0)
MCH: 29.5 pg (ref 26.0–34.0)
MCHC: 30.7 g/dL (ref 30.0–36.0)
MCV: 96.2 fL (ref 80.0–100.0)
Monocytes Absolute: 1.5 10*3/uL — ABNORMAL HIGH (ref 0.1–1.0)
Monocytes Relative: 9 %
Neutro Abs: 13.9 10*3/uL — ABNORMAL HIGH (ref 1.7–7.7)
Neutrophils Relative %: 84 %
Platelets: 353 10*3/uL (ref 150–400)
RBC: 2.88 MIL/uL — ABNORMAL LOW (ref 3.87–5.11)
RDW: 17 % — ABNORMAL HIGH (ref 11.5–15.5)
WBC: 16.6 10*3/uL — ABNORMAL HIGH (ref 4.0–10.5)
nRBC: 0.2 % (ref 0.0–0.2)

## 2018-12-04 LAB — BASIC METABOLIC PANEL
Anion gap: 7 (ref 5–15)
BUN: 14 mg/dL (ref 8–23)
CO2: 25 mmol/L (ref 22–32)
Calcium: 8.3 mg/dL — ABNORMAL LOW (ref 8.9–10.3)
Chloride: 103 mmol/L (ref 98–111)
Creatinine, Ser: 0.67 mg/dL (ref 0.44–1.00)
GFR calc Af Amer: >60 mL/min (ref >60)
GFR calc non Af Amer: >60 mL/min (ref >60)
Glucose, Bld: 135 mg/dL — ABNORMAL HIGH (ref 70–99)
Potassium: 4.2 mmol/L (ref 3.5–5.1)
Sodium: 135 mmol/L (ref 135–145)

## 2018-12-04 LAB — CULTURE, BLOOD (ROUTINE X 2)
Culture: NO GROWTH
Culture: NO GROWTH
Special Requests: ADEQUATE
Special Requests: ADEQUATE

## 2018-12-04 LAB — GLUCOSE, CAPILLARY
Glucose-Capillary: 142 mg/dL — ABNORMAL HIGH (ref 70–99)
Glucose-Capillary: 148 mg/dL — ABNORMAL HIGH (ref 70–99)

## 2018-12-04 MED ORDER — LEVOFLOXACIN 750 MG PO TABS: 750.0000 mg | ORAL_TABLET | Freq: Every day | ORAL | 0 refills | Status: DC

## 2018-12-04 MED ORDER — HEPARIN SOD (PORK) LOCK FLUSH 100 UNIT/ML IV SOLN
500.0000 [IU] | Freq: Once | INTRAVENOUS | Status: AC
  Administered 2018-12-04: 13:00:00 500 [IU] via INTRAVENOUS
  Filled 2018-12-04: qty 5

## 2018-12-04 MED ORDER — OXYCODONE HCL ER 10 MG PO T12A: 10.0000 mg | EXTENDED_RELEASE_TABLET | Freq: Two times a day (BID) | ORAL | 0 refills | Status: DC

## 2018-12-04 MED ORDER — OXYCODONE HCL 10 MG PO TABS: 10.0000 mg | ORAL_TABLET | Freq: Four times a day (QID) | ORAL | 0 refills | Status: DC | PRN

## 2018-12-04 NOTE — TOC Transition Note (Signed)
Transition of Care [facility]) - CM/SW Discharge Note   Patient Details  Name: Tamara Parrish MRN: [id number] Date of Birth: 1956/05/17  [facility] of [facility] CM/SW Contact:  Latanya Maudlin, RN Phone Number: 12/04/2018, 10:19 AM   Clinical Narrative:  Patient to be discharged per MD order. Orders in place for home health services. Patient is active with [facility] and wishes to resume. Notified Helene Kelp with [facility] of resumption of care. Patient also in need of wheelchair. DME order in place. Notified Brad with [employer] who will schedule home delivery. Patient is returning to her daughters home address. Which is [place], [place] [address]. EMS to transport. Patient on chronic O2.       Final next level of care: [place] Barriers to Discharge: No Barriers Identified   Patient Goals and CMS Choice Patient states their goals for this hospitalization and ongoing recovery are:: Patient wants to return back home with home health. CMS Medicare.gov Compare Post Acute Care list provided to:: Patient Choice offered to / list presented to : Patient  Discharge Placement                       Discharge Plan and Services In-house Referral: Clinical Social Work Discharge Planning Services: CM Consult Post Acute Care Choice: Home Health, Resumption of Svcs/PTA Provider              HH Arranged: RN, PT, Nurse's Aide, Social Work [employer] Agency: [facility] [employer] [facility] [employer]   Social Determinants of Health ([place]) Interventions     Readmission Risk Interventions Readmission Risk Prevention Plan 12/01/2018  Transportation Screening Complete  Medication Review [employer]) Complete  PCP or Specialist appointment within 3-5 days of discharge Complete  HRI or [place] Complete  SW Recovery Care/Counseling Consult Complete  [place] Not Applicable  Some recent data might  be hidden

## 2018-12-04 NOTE — Discharge Summary (Addendum)
[facility] [place] NAME: Tamara Parrish    MR#:  [id number]  DATE OF BIRTH:  1955/11/04  DATE OF ADMISSION:  11/29/2018   ADMITTING PHYSICIAN: Lance Coon, MD  DATE OF DISCHARGE: 12/04/18  PRIMARY CARE PHYSICIAN: Glendon Axe, MD   ADMISSION DIAGNOSIS:   Hypoxia [R09.02] HCAP (healthcare-associated pneumonia) [J18.9] Exposure to Covid-19 Virus [Z20.828]  DISCHARGE DIAGNOSIS:   Principal Problem:   Sepsis ([place]) Active Problems:   Diabetes mellitus type 2, uncomplicated ([place])   Essential hypertension   Rheumatoid arthritis ([place])   HCAP (healthcare-associated pneumonia)   Suspected Covid-19 Virus Infection   SECONDARY DIAGNOSIS:   Past Medical History:  Diagnosis Date  . Cancer ([place])    lung ca DX 2019  . Collagen vascular disease ([facility])    RA  . DM2 (diabetes mellitus, type 2) ([place])   . Dyspnea   . Hyperlipemia   . Hypertension   . Osteomyelitis ([place])   . Pneumonia   . RA (rheumatoid arthritis) ([place])   . Rheumatoid arthritis [facility])     HOSPITAL COURSE:  Patient is a 63 y.o. female with past medical history significant for multiple medical problems, including stage IV lung squamous cell carcinoma, chronic respiratory failure on home oxygen.  Severe rheumatoid arthritis, chronic pain who was admitted to hospital for shortness of breath. Found to have worsening of pneumonia on chest x-ray.  #1. Sepsis: Likely secondary to pneumonia on admission. WBC elevated yesterday now improving down from 17.1 to 16.6.  - Repeat chest-xray for further evaluation of possible opportunist infections given her immunosuppression was stable -Cultures negative so far -Continue coverage with Levaquin for additional 3 days following discharge -COVID-19 test negative  2. HCAP failed outpatient treatment. Received aztreonam and vancomycin in ED which was switched to IV Levaquin. -Continue Levaquin as above  3. Acute on chronic anemia-  Hx of lung cancer -Status post PRBC with improvement at discharge -Follow up with Oncology  5.Lung cancer- on chemotherapy last dose on 10/12/2018. Chemotherapy has been held  By oncology due to hospitalization, upper respiratory symptoms and poor performance status.  -Appears progressive -Could be responsible for hypoxia -Continue home oxygen via nasal cannula at discharge -Oncology follow up once acute issues resolve  6. Rheumatoid Arthritis: Patient has chronic pain syndrome due to rheumatoid arthritis. Could be secondary to immunotherapy toxicities -Following with rheumatology -Continue home meds at discharge  7. Type 2 diabetes mellitus- Well controlled - Continue home meds  DISCHARGE CONDITIONS:   [place] CONSULTS OBTAINED:   1.Oncology  DRUG ALLERGIES:   Allergies  Allergen Reactions  . Ibuprofen Other (See Comments)    Is not supposed to take because of her kidneys.  . Penicillins Other (See Comments)    Has patient had a PCN reaction causing immediate rash, facial/tongue/throat swelling, SOB or lightheadedness with hypotension: Unknown Has patient had a PCN reaction causing severe rash involving mucus membranes or skin necrosis: Unknown Has patient had a PCN reaction that required hospitalization: Unknown Has patient had a PCN reaction occurring within the last 10 years: Unknown If all of the above answers are "NO", then may proceed with Cephalosporin use.   DISCHARGE MEDICATIONS:   Allergies as of 12/04/2018      Reactions   Ibuprofen Other (See Comments)   Is not supposed to take because of her kidneys.   Penicillins Other (See Comments)   Has patient had a PCN reaction causing immediate rash, facial/tongue/throat swelling, SOB or lightheadedness  with hypotension: Unknown Has patient had a PCN reaction causing severe rash involving mucus membranes or skin necrosis: Unknown Has patient had a PCN reaction that required hospitalization: Unknown Has patient had a  PCN reaction occurring within the last 10 years: Unknown If all of the above answers are "NO", then may proceed with Cephalosporin use.      Medication List    TAKE these medications   albuterol 108 (90 Base) MCG/ACT inhaler Commonly known as:  PROVENTIL HFA;VENTOLIN HFA Inhale 2 puffs into the lungs every 6 (six) hours as needed for wheezing or shortness of breath.   albuterol (2.5 MG/3ML) 0.083% nebulizer solution Commonly known as:  PROVENTIL Take 3 mLs (2.5 mg total) by nebulization every 6 (six) hours as needed for wheezing or shortness of breath.   ALPRAZolam 0.25 MG tablet Commonly known as:  XANAX Take 0.25 mg by mouth at bedtime as needed for anxiety.   amLODipine 5 MG tablet Commonly known as:  NORVASC Take 5 mg by mouth daily.   aspirin 81 MG chewable tablet Chew 1 tablet by mouth daily.   atorvastatin 40 MG tablet Commonly known as:  LIPITOR Take 40 mg by mouth daily.   Basaglar KwikPen 100 UNIT/ML Sopn Inject 0.1 mLs (10 Units total) into the skin every morning. What changed:  how much to take   budesonide-formoterol 160-4.5 MCG/ACT inhaler Commonly known as:  Symbicort Inhale 2 puffs into the lungs 2 (two) times daily.   dexamethasone 4 MG tablet Commonly known as:  DECADRON Take 2 tablets (8 mg total) by mouth daily. Start the day after chemotherapy for 2 days.   feeding supplement (ENSURE ENLIVE) Liqd Take 237 mLs by mouth 2 (two) times daily between meals.   ferrous sulfate 325 (65 FE) MG EC tablet Take 1 tablet (325 mg total) by mouth 2 (two) times daily with a meal.   folic acid 1 MG tablet Commonly known as:  FOLVITE Take 1 mg by mouth daily.   hydroxychloroquine 200 MG tablet Commonly known as:  PLAQUENIL 1 tab twice a day, 90 days   levofloxacin 750 MG tablet Commonly known as:  Levaquin Take 1 tablet (750 mg total) by mouth daily for 3 days.   lisinopril 40 MG tablet Commonly known as:  PRINIVIL,ZESTRIL Take 40 mg by mouth daily.    metoprolol tartrate 25 MG tablet Commonly known as:  LOPRESSOR Take 25 mg by mouth 2 (two) times daily.   mirtazapine 7.5 MG tablet Commonly known as:  REMERON Take 1 tablet (7.5 mg total) by mouth at bedtime.   Narcan 4 MG/0.1ML Liqd nasal spray kit Generic drug:  naloxone CALL 911. ADMINISTER A SINGLE SPRAY OF NARCAN IN ONE NOSTRIL. REPEAT EVERY 3 MINUTES AS NEEDED IF NO OR MINIMAL RESPONSE.   nystatin 100000 UNIT/ML suspension Commonly known as:  MYCOSTATIN Take 5 mLs (500,000 Units total) by mouth 4 (four) times daily.   omeprazole 20 MG capsule Commonly known as:  PRILOSEC Take 1 capsule (20 mg total) by mouth daily.   ondansetron 8 MG tablet Commonly known as:  Zofran Take 1 tablet (8 mg total) by mouth 2 (two) times daily as needed for refractory nausea / vomiting. Start on day 3 after chemo.   Oxycodone HCl 10 MG Tabs Take 1 tablet (10 mg total) by mouth every 6 (six) hours as needed.   oxyCODONE 10 mg 12 hr tablet Commonly known as:  OXYCONTIN Take 1 tablet (10 mg total) by mouth every 12 (twelve)  hours.   predniSONE 5 MG tablet Commonly known as:  DELTASONE Take 1 tablet (5 mg total) by mouth daily with breakfast.   prochlorperazine 10 MG tablet Commonly known as:  COMPAZINE Take 1 tablet (10 mg total) by mouth every 6 (six) hours as needed (Nausea or vomiting).   sucralfate 1 g tablet Commonly known as:  Carafate Take 1 tablet (1 g total) by mouth 3 (three) times daily for 5 days. Dissolve in 3-4 tbsp warm water, swish and swallow   triamterene-hydrochlorothiazide 37.5-25 MG capsule Commonly known as:  DYAZIDE Take 1 capsule by mouth daily.        DISCHARGE INSTRUCTIONS:    DIET:   Diabetic diet  ACTIVITY:   Activity as tolerated  OXYGEN:   Home Oxygen: Yes.    Oxygen Delivery: 2 liters/min via Patient connected to nasal cannula oxygen  DISCHARGE LOCATION:   home   If you experience worsening of your admission symptoms, develop  shortness of breath, life threatening emergency, suicidal or homicidal thoughts you must seek medical attention immediately by calling 911 or calling your MD immediately  if symptoms less severe.  You Must read complete instructions/literature along with all the possible adverse reactions/side effects for all the Medicines you take and that have been prescribed to you. Take any new Medicines after you have completely understood and accpet all the possible adverse reactions/side effects.   Please note  You were cared for by a hospitalist during your hospital stay. If you have any questions about your discharge medications or the care you received while you were in the hospital after you are discharged, you can call the unit and asked to speak with the hospitalist on call if the hospitalist that took care of you is not available. Once you are discharged, your primary care physician will handle any further medical issues. Please note that NO REFILLS for any discharge medications will be authorized once you are discharged, as it is imperative that you return to your primary care physician (or establish a relationship with a primary care physician if you do not have one) for your aftercare needs so that they can reassess your need for medications and monitor your lab values.    On the day of Discharge:  VITAL SIGNS:   Blood pressure 135/61, pulse (!) 105, temperature 98.2 F (36.8 C), temperature source Oral, resp. rate (!) 24, height '5\' 6"'$  (1.676 m), weight 85.5 kg, SpO2 95 %.  PHYSICAL EXAMINATION:    GENERAL:  63 y.o.-year-old patient lying in the bed with no acute distress.  EYES: Pupils equal, round, reactive to light and accommodation. No scleral icterus. Extraocular muscles intact.  HEENT: Head atraumatic, normocephalic. Oropharynx and nasopharynx clear.  NECK:  Supple, no jugular venous distention. No thyroid enlargement, no tenderness.  LUNGS: Normal breath sounds bilaterally, no wheezing,  rales,rhonchi or crepitation. No use of accessory muscles of respiration.  CARDIOVASCULAR: S1, S2 normal. No murmurs, rubs, or gallops.  ABDOMEN: Soft, non-tender, non-distended. Bowel sounds present. No organomegaly or mass.  EXTREMITIES: No pedal edema, cyanosis, or clubbing.  NEUROLOGIC: Cranial nerves II through XII are intact. Muscle strength 5/5 in all extremities. Sensation intact. Gait not checked.  PSYCHIATRIC: The patient is alert and oriented x 3.  SKIN: No obvious rash, lesion, or ulcer.   DATA REVIEW:   CBC Recent Labs  Lab 12/04/18 0530  WBC 16.6*  HGB 8.5*  HCT 27.7*  PLT 353    Chemistries  Recent Labs  Lab 11/29/18  1559  12/04/18 0530  NA 136   < > 135  K 5.2*   < > 4.2  CL 105   < > 103  CO2 25   < > 25  GLUCOSE 108*   < > 135*  BUN 18   < > 14  CREATININE 0.81   < > 0.67  CALCIUM 9.5   < > 8.3*  AST 19  --   --   ALT 13  --   --   ALKPHOS 74  --   --   BILITOT 0.3  --   --    < > = values in this interval not displayed.     Microbiology Results  Results for orders placed or performed during the hospital encounter of 11/29/18  Blood Culture (routine x 2)     Status: None   Collection Time: 11/29/18  3:59 PM  Result Value Ref Range Status   Specimen Description BLOOD RIGHT ANTECUBITAL  Final   Special Requests   Final    BOTTLES DRAWN AEROBIC AND ANAEROBIC Blood Culture adequate volume   Culture   Final    NO GROWTH 5 DAYS Performed at [facility], [address], [place], [place] [address]    Report Status 12/04/2018 FINAL  Final  Blood Culture (routine x 2)     Status: None   Collection Time: 11/29/18  3:59 PM  Result Value Ref Range Status   Specimen Description BLOOD BLOOD RIGHT FOREARM  Final   Special Requests   Final    BOTTLES DRAWN AEROBIC AND ANAEROBIC Blood Culture adequate volume   Culture   Final    NO GROWTH 5 DAYS Performed at [facility], [address] [place], [place] [address]    Report Status  12/04/2018 FINAL  Final  MRSA PCR Screening     Status: None   Collection Time: 11/29/18  6:36 PM  Result Value Ref Range Status   MRSA by PCR NEGATIVE NEGATIVE Final    Comment:        The GeneXpert MRSA Assay (FDA approved for NASAL specimens only), is one component of a comprehensive MRSA colonization surveillance program. It is not intended to diagnose MRSA infection nor to guide or monitor treatment for MRSA infections. Performed at [facility], [address] [place], [place] [address]   Novel Coronavirus, NAA (hospital order; send-out to ref lab)     Status: None   Collection Time: 11/29/18 10:32 PM  Result Value Ref Range Status   SARS-CoV-2, NAA NOT DETECTED NOT DETECTED Final    Comment: Negative (Not Detected) results do not exclude infection caused by SARS CoV 2 and should not be used as the sole basis for treatment or other patient management decisions. Optimum specimen types and timing for peak viral levels during infections caused  by SARS CoV 2 have not been determined. Collection of multiple specimens (types and time points) from the same patient may be necessary to detect the virus. Improper specimen collection and handling, sequence variability underlying assay primers and or probes, or the presence of organisms in  quantities less than the limit of detection of the assay may lead to false negative results. Positive and negative predictive values of testing are highly dependent on prevalence. False negative results are more likely when prevalence of disease is high. (NOTE) The expected result is Negative (Not Detected). The SARS CoV 2 test is intended for the presumptive qualitative  detection of nucleic acid  from SARS CoV 2 in upper and lower  respir atory specimens. Testing methodology is real time RT PCR. Test results must be correlated with clinical presentation and  evaluated in the context of other laboratory and epidemiologic data.  Test  performance can be affected because the epidemiology and  clinical spectrum of infection caused by SARS CoV 2 is not fully  known. For example, the optimum types of specimens to collect and  when during the course of infection these specimens are most likely  to contain detectable viral RNA may not be known. This test has not been Food and Drug Administration (FDA) cleared or  approved and has been authorized by FDA under an Emergency Use  Authorization (EUA). The test is only authorized for the duration of  the declaration that circumstances exist justifying the authorization  of emergency use of in vitro diagnostic tests for detection and or  diagnosis of SARS CoV 2 under Section 564(b)(1) of the Act, 21 U.S.C.  section [phone] 3(b)(1), unless the authorization is terminated or   revoked sooner. [facility] is certified under the  Clinical Laboratory Improvement Amendments of 1988 (CLIA), 42 U.S.C.  section [phone], to perform high complexity tests. Performed at [id number] [address], [place], [place] [address] Laboratory Director: Loleta Books, MD Fact Sheet for Healthcare Providers  BankingDealers.co.za Fact Sheet for Patients  StrictlyIdeas.no Performed at [facility], [place] [address] [place], [place] [address]    Coronavirus Source NASOPHARYNGEAL  Final    Comment: Performed at [facility], [address] [place], [place] [address]    RADIOLOGY:  Dg Chest 1 View  Result Date: 12/03/2018 CLINICAL DATA:  Fever. History of lung cancer in 2019. History of pneumonia, diabetes, former smoker. EXAM: CHEST  1 VIEW COMPARISON:  Chest x-rays dated 11/29/2018 and 10/26/2018. Chest CT dated 11/29/2018. FINDINGS: Heart size and mediastinal contours appear stable. RIGHT chest wall Port-A-Cath appears stable in position with tip at the level of the mid  SVC. Persistent near complete opacification of the LEFT hemithorax, with small residual aeration at the LEFT lung apex. Increased interstitial markings within the RIGHT lung suggesting developing edema. No pneumothorax seen. Osseous structures about the chest are unremarkable. IMPRESSION: Stable chest x-ray. Persistent near complete opacification of the LEFT hemithorax, compatible with the combination of cavitary LEFT upper lobe pulmonary lesion (known lung cancer) and the progressive interstitial and ill-defined airspace disease in the LEFT lower lobe better demonstrated on chest CT of 11/29/2018 with differential of metastatic progression versus superimposed pneumonia. Electronically Signed   By: Franki Cabot M.D.   On: 12/03/2018 13:55     Management plans discussed with the patient, family and they are in agreement.  CODE STATUS:     Code Status Orders  (From admission, onward)         Start     Ordered   11/29/18 2250  Full code  Continuous     11/29/18 2249        Code Status History    Date Active Date Inactive Code Status Order ID Comments User Context   11/20/2018 2045 11/22/2018 1806 Full Code [id number]  Loletha Grayer, MD ED   10/26/2018 2037 10/28/2018 2050 Partial Code [id number]  Hillary Bow, MD Inpatient   10/26/2018 1728 10/26/2018 2037 DNR [id number]  Hillary Bow, MD ED   02/21/2018 2004 02/24/2018 1542 Full Code [id number]  Gladstone Lighter, MD ED  02/07/2018 2202 02/11/2018 1738 Full Code [id number]  Dustin Flock, MD Inpatient      TOTAL TIME TAKING CARE OF THIS PATIENT: 37 minutes.   This patient was staffed with Dr. Atha Starks, Manuella Ghazi who personally evaluated patient, reviewed documentation and agreed with discharge plan  as above.  Rufina Falco, DNP, FNP-BC [facility] Nurse Practitioner   12/04/2018 at 9:48 AM  Between 7am to 6pm - Pager - [phone]  After 6pm go to [email or web address] - Proofreader  [facility] [place] [facility]  Office   [phone]  CC: Primary care physician; Glendon Axe, MD   Note: This dictation was prepared with Dragon dictation along with smaller phrase technology. Any transcriptional errors that result from this process are unintentional.

## 2018-12-04 NOTE — Progress Notes (Signed)
Discharge instructions provided to pt.  Port-a-cath flushed with saline and heparin.  Port-a-cath de-accessed.  Covered with clean, sterile dressing.  Non-urgent transport via EMS contacted to transport pt home.

## 2018-12-04 NOTE — Plan of Care (Signed)
Ready for discharge.   Problem: Education: Goal: Knowledge of General Education information will improve Description Including pain rating scale, medication(s)/side effects and non-pharmacologic comfort measures 12/04/2018 1130 by Aubery Lapping, RN Outcome: Completed/Met 12/04/2018 1129 by Aubery Lapping, RN Outcome: Progressing   Problem: Health Behavior/Discharge Planning: Goal: Ability to manage health-related needs will improve 12/04/2018 1130 by Aubery Lapping, RN Outcome: Completed/Met 12/04/2018 1129 by Aubery Lapping, RN Outcome: Progressing   Problem: Clinical Measurements: Goal: Ability to maintain clinical measurements within normal limits will improve 12/04/2018 1130 by Aubery Lapping, RN Outcome: Completed/Met 12/04/2018 1129 by Aubery Lapping, RN Outcome: Progressing Goal: Will remain free from infection 12/04/2018 1130 by Aubery Lapping, RN Outcome: Completed/Met 12/04/2018 1129 by Aubery Lapping, RN Outcome: Progressing Goal: Diagnostic test results will improve 12/04/2018 1130 by Aubery Lapping, RN Outcome: Completed/Met 12/04/2018 1129 by Aubery Lapping, RN Outcome: Progressing Goal: Respiratory complications will improve 12/04/2018 1130 by Aubery Lapping, RN Outcome: Completed/Met 12/04/2018 1129 by Aubery Lapping, RN Outcome: Progressing Goal: Cardiovascular complication will be avoided 12/04/2018 1130 by Aubery Lapping, RN Outcome: Completed/Met 12/04/2018 1129 by Aubery Lapping, RN Outcome: Progressing   Problem: Activity: Goal: Risk for activity intolerance will decrease 12/04/2018 1130 by Aubery Lapping, RN Outcome: Completed/Met 12/04/2018 1129 by Aubery Lapping, RN Outcome: Progressing   Problem: Nutrition: Goal: Adequate nutrition will be maintained 12/04/2018 1130 by Aubery Lapping, RN Outcome: Completed/Met 12/04/2018 1129 by Aubery Lapping, RN Outcome:  Progressing   Problem: Coping: Goal: Level of anxiety will decrease 12/04/2018 1130 by Aubery Lapping, RN Outcome: Completed/Met 12/04/2018 1129 by Aubery Lapping, RN Outcome: Progressing   Problem: Elimination: Goal: Will not experience complications related to bowel motility 12/04/2018 1130 by Aubery Lapping, RN Outcome: Completed/Met 12/04/2018 1129 by Aubery Lapping, RN Outcome: Progressing Goal: Will not experience complications related to urinary retention 12/04/2018 1130 by Aubery Lapping, RN Outcome: Completed/Met 12/04/2018 1129 by Aubery Lapping, RN Outcome: Progressing   Problem: Pain Managment: Goal: General experience of comfort will improve 12/04/2018 1130 by Aubery Lapping, RN Outcome: Completed/Met 12/04/2018 1129 by Aubery Lapping, RN Outcome: Progressing   Problem: Safety: Goal: Ability to remain free from injury will improve 12/04/2018 1130 by Aubery Lapping, RN Outcome: Completed/Met 12/04/2018 1129 by Aubery Lapping, RN Outcome: Progressing   Problem: Skin Integrity: Goal: Risk for impaired skin integrity will decrease 12/04/2018 1130 by Aubery Lapping, RN Outcome: Completed/Met 12/04/2018 1129 by Aubery Lapping, RN Outcome: Progressing   Problem: Activity: Goal: Ability to tolerate increased activity will improve 12/04/2018 1130 by Aubery Lapping, RN Outcome: Completed/Met 12/04/2018 1129 by Aubery Lapping, RN Outcome: Progressing   Problem: Clinical Measurements: Goal: Ability to maintain a body temperature in the normal range will improve 12/04/2018 1130 by Aubery Lapping, RN Outcome: Completed/Met 12/04/2018 1129 by Aubery Lapping, RN Outcome: Progressing   Problem: Respiratory: Goal: Ability to maintain adequate ventilation will improve 12/04/2018 1130 by Aubery Lapping, RN Outcome: Completed/Met 12/04/2018 1129 by Aubery Lapping, RN Outcome:  Progressing Goal: Ability to maintain a clear airway will improve 12/04/2018 1130 by Aubery Lapping, RN Outcome: Completed/Met 12/04/2018 1129 by Aubery Lapping, RN Outcome: Progressing

## 2018-12-04 NOTE — Discharge Instructions (Signed)
Community-Acquired Pneumonia, Adult  Pneumonia is an infection of the lungs. It causes swelling in the airways of the lungs. Mucus and fluid may also build up inside the airways.  One type of pneumonia can happen while a person is in a hospital. A different type can happen when a person is not in a hospital (community-acquired pneumonia).   What are the causes?    This condition is caused by germs (viruses, bacteria, or fungi). Some types of germs can be passed from one person to another. This can happen when you breathe in droplets from the cough or sneeze of an infected person.  What increases the risk?  You are more likely to develop this condition if you:   Have a long-term (chronic) disease, such as:  ? Chronic obstructive pulmonary disease (COPD).  ? Asthma.  ? Cystic fibrosis.  ? Congestive heart failure.  ? Diabetes.  ? Kidney disease.   Have HIV.   Have sickle cell disease.   Have had your spleen removed.   Do not take good care of your teeth and mouth (poor dental hygiene).   Have a medical condition that increases the risk of breathing in droplets from your own mouth and nose.   Have a weakened body defense system (immune system).   Are a smoker.   Travel to areas where the germs that cause this illness are common.   Are around certain animals or the places they live.  What are the signs or symptoms?   A dry cough.   A wet (productive) cough.   Fever.   Sweating.   Chest pain. This often happens when breathing deeply or coughing.   Fast breathing or trouble breathing.   Shortness of breath.   Shaking chills.   Feeling tired (fatigue).   Muscle aches.  How is this treated?  Treatment for this condition depends on many things. Most adults can be treated at home. In some cases, treatment must happen in a hospital. Treatment may include:   Medicines given by mouth or through an IV tube.   Being given extra oxygen.   Respiratory therapy.  In rare cases, treatment for very bad pneumonia  may include:   Using a machine to help you breathe.   Having a procedure to remove fluid from around your lungs.  Follow these instructions at home:  Medicines   Take over-the-counter and prescription medicines only as told by your doctor.  ? Only take cough medicine if you are losing sleep.   If you were prescribed an antibiotic medicine, take it as told by your doctor. Do not stop taking the antibiotic even if you start to feel better.  General instructions     Sleep with your head and neck raised (elevated). You can do this by sleeping in a recliner or by putting a few pillows under your head.   Rest as needed. Get at least 8 hours of sleep each night.   Drink enough water to keep your pee (urine) pale yellow.   Eat a healthy diet that includes plenty of vegetables, fruits, whole grains, low-fat dairy products, and lean protein.   Do not use any products that contain nicotine or tobacco. These include cigarettes, e-cigarettes, and chewing tobacco. If you need help quitting, ask your doctor.   Keep all follow-up visits as told by your doctor. This is important.  How is this prevented?  A shot (vaccine) can help prevent pneumonia. Shots are often suggested for:   People   older than 65 years of age.   People older than 65 years of age who:  ? Are having cancer treatment.  ? Have long-term (chronic) lung disease.  ? Have problems with their body's defense system.  You may also prevent pneumonia if you take these actions:   Get the flu (influenza) shot every year.   Go to the dentist as often as told.   Wash your hands often. If you cannot use soap and water, use hand sanitizer.  Contact a doctor if:   You have a fever.   You lose sleep because your cough medicine does not help.  Get help right away if:   You are short of breath and it gets worse.   You have more chest pain.   Your sickness gets worse. This is very serious if:  ? You are an older adult.  ? Your body's defense system is weak.   You  cough up blood.  Summary   Pneumonia is an infection of the lungs.   Most adults can be treated at home. Some will need treatment in a hospital.   Drink enough water to keep your pee pale yellow.   Get at least 8 hours of sleep each night.  This information is not intended to replace advice given to you by your health care provider. Make sure you discuss any questions you have with your health care provider.  Document Released: 01/27/2008 Document Revised: 04/07/2018 Document Reviewed: 04/07/2018  Elsevier Interactive Patient Education  2019 [employer]

## 2018-12-05 ENCOUNTER — Other Ambulatory Visit: Payer: Self-pay | Admitting: Internal Medicine

## 2018-12-05 ENCOUNTER — Other Ambulatory Visit: Payer: Self-pay | Admitting: *Deleted

## 2018-12-05 MED ORDER — MIRTAZAPINE 7.5 MG PO TABS: 7.5000 mg | ORAL_TABLET | Freq: Every day | ORAL | 3 refills | Status: AC

## 2018-12-06 ENCOUNTER — Ambulatory Visit: Payer: PRIVATE HEALTH INSURANCE | Admitting: Internal Medicine

## 2018-12-07 ENCOUNTER — Encounter: Payer: Self-pay | Admitting: Oncology

## 2018-12-07 ENCOUNTER — Inpatient Hospital Stay
Admission: EM | Admit: 2018-12-07 | Discharge: 2018-12-18 | DRG: 871 | Disposition: A | Discharge status: Hospice | Payer: Medicaid Other | Attending: Internal Medicine | Admitting: Internal Medicine

## 2018-12-07 ENCOUNTER — Other Ambulatory Visit: Payer: Self-pay

## 2018-12-07 ENCOUNTER — Inpatient Hospital Stay (HOSPITAL_BASED_OUTPATIENT_CLINIC_OR_DEPARTMENT_OTHER): Payer: Medicaid Other | Admitting: Oncology

## 2018-12-07 ENCOUNTER — Telehealth: Payer: Self-pay | Admitting: Hospice and Palliative Medicine

## 2018-12-07 ENCOUNTER — Emergency Department: Payer: Medicaid Other

## 2018-12-07 DIAGNOSIS — J8410 Pulmonary fibrosis, unspecified: Secondary | ICD-10-CM | POA: Diagnosis present

## 2018-12-07 DIAGNOSIS — Z9981 Dependence on supplemental oxygen: Secondary | ICD-10-CM

## 2018-12-07 DIAGNOSIS — J189 Pneumonia, unspecified organism: Secondary | ICD-10-CM | POA: Diagnosis not present

## 2018-12-07 DIAGNOSIS — R0603 Acute respiratory distress: Secondary | ICD-10-CM

## 2018-12-07 DIAGNOSIS — B370 Candidal stomatitis: Secondary | ICD-10-CM | POA: Diagnosis present

## 2018-12-07 DIAGNOSIS — J9621 Acute and chronic respiratory failure with hypoxia: Secondary | ICD-10-CM | POA: Diagnosis present

## 2018-12-07 DIAGNOSIS — E7800 Pure hypercholesterolemia, unspecified: Secondary | ICD-10-CM | POA: Diagnosis present

## 2018-12-07 DIAGNOSIS — K219 Gastro-esophageal reflux disease without esophagitis: Secondary | ICD-10-CM | POA: Diagnosis present

## 2018-12-07 DIAGNOSIS — I10 Essential (primary) hypertension: Secondary | ICD-10-CM | POA: Diagnosis present

## 2018-12-07 DIAGNOSIS — C3490 Malignant neoplasm of unspecified part of unspecified bronchus or lung: Secondary | ICD-10-CM | POA: Diagnosis present

## 2018-12-07 DIAGNOSIS — R0602 Shortness of breath: Secondary | ICD-10-CM | POA: Diagnosis not present

## 2018-12-07 DIAGNOSIS — Z7189 Other specified counseling: Secondary | ICD-10-CM

## 2018-12-07 DIAGNOSIS — F17211 Nicotine dependence, cigarettes, in remission: Secondary | ICD-10-CM | POA: Diagnosis not present

## 2018-12-07 DIAGNOSIS — L89301 Pressure ulcer of unspecified buttock, stage 1: Secondary | ICD-10-CM | POA: Diagnosis present

## 2018-12-07 DIAGNOSIS — Z79899 Other long term (current) drug therapy: Secondary | ICD-10-CM

## 2018-12-07 DIAGNOSIS — L8990 Pressure ulcer of unspecified site, unspecified stage: Secondary | ICD-10-CM

## 2018-12-07 DIAGNOSIS — G8929 Other chronic pain: Secondary | ICD-10-CM | POA: Diagnosis present

## 2018-12-07 DIAGNOSIS — Z7982 Long term (current) use of aspirin: Secondary | ICD-10-CM

## 2018-12-07 DIAGNOSIS — F419 Anxiety disorder, unspecified: Secondary | ICD-10-CM | POA: Diagnosis present

## 2018-12-07 DIAGNOSIS — Y95 Nosocomial condition: Secondary | ICD-10-CM | POA: Diagnosis present

## 2018-12-07 DIAGNOSIS — E1142 Type 2 diabetes mellitus with diabetic polyneuropathy: Secondary | ICD-10-CM | POA: Diagnosis present

## 2018-12-07 DIAGNOSIS — Z66 Do not resuscitate: Secondary | ICD-10-CM | POA: Diagnosis not present

## 2018-12-07 DIAGNOSIS — Z7951 Long term (current) use of inhaled steroids: Secondary | ICD-10-CM

## 2018-12-07 DIAGNOSIS — Z8619 Personal history of other infectious and parasitic diseases: Secondary | ICD-10-CM

## 2018-12-07 DIAGNOSIS — C3412 Malignant neoplasm of upper lobe, left bronchus or lung: Secondary | ICD-10-CM | POA: Diagnosis not present

## 2018-12-07 DIAGNOSIS — M069 Rheumatoid arthritis, unspecified: Secondary | ICD-10-CM

## 2018-12-07 DIAGNOSIS — Z8701 Personal history of pneumonia (recurrent): Secondary | ICD-10-CM

## 2018-12-07 DIAGNOSIS — A419 Sepsis, unspecified organism: Secondary | ICD-10-CM | POA: Diagnosis not present

## 2018-12-07 DIAGNOSIS — C799 Secondary malignant neoplasm of unspecified site: Secondary | ICD-10-CM | POA: Diagnosis not present

## 2018-12-07 DIAGNOSIS — E785 Hyperlipidemia, unspecified: Secondary | ICD-10-CM | POA: Diagnosis present

## 2018-12-07 DIAGNOSIS — E875 Hyperkalemia: Secondary | ICD-10-CM | POA: Diagnosis not present

## 2018-12-07 DIAGNOSIS — Z794 Long term (current) use of insulin: Secondary | ICD-10-CM

## 2018-12-07 DIAGNOSIS — J301 Allergic rhinitis due to pollen: Secondary | ICD-10-CM | POA: Diagnosis present

## 2018-12-07 DIAGNOSIS — Z20828 Contact with and (suspected) exposure to other viral communicable diseases: Secondary | ICD-10-CM | POA: Diagnosis present

## 2018-12-07 DIAGNOSIS — Z95828 Presence of other vascular implants and grafts: Secondary | ICD-10-CM

## 2018-12-07 DIAGNOSIS — Z886 Allergy status to analgesic agent status: Secondary | ICD-10-CM

## 2018-12-07 DIAGNOSIS — Z515 Encounter for palliative care: Secondary | ICD-10-CM | POA: Diagnosis not present

## 2018-12-07 DIAGNOSIS — E119 Type 2 diabetes mellitus without complications: Secondary | ICD-10-CM

## 2018-12-07 DIAGNOSIS — D649 Anemia, unspecified: Secondary | ICD-10-CM | POA: Diagnosis present

## 2018-12-07 DIAGNOSIS — Z87891 Personal history of nicotine dependence: Secondary | ICD-10-CM | POA: Diagnosis not present

## 2018-12-07 DIAGNOSIS — J9620 Acute and chronic respiratory failure, unspecified whether with hypoxia or hypercapnia: Secondary | ICD-10-CM | POA: Diagnosis not present

## 2018-12-07 DIAGNOSIS — Z801 Family history of malignant neoplasm of trachea, bronchus and lung: Secondary | ICD-10-CM

## 2018-12-07 DIAGNOSIS — M0589 Other rheumatoid arthritis with rheumatoid factor of multiple sites: Secondary | ICD-10-CM | POA: Diagnosis present

## 2018-12-07 DIAGNOSIS — Z880 Allergy status to penicillin: Secondary | ICD-10-CM

## 2018-12-07 DIAGNOSIS — J440 Chronic obstructive pulmonary disease with acute lower respiratory infection: Secondary | ICD-10-CM | POA: Diagnosis present

## 2018-12-07 LAB — BLOOD GAS, VENOUS
Acid-base deficit: 1.5 mmol/L (ref 0.0–2.0)
Bicarbonate: 24.3 mmol/L (ref 20.0–28.0)
O2 Saturation: 87.7 %
Patient temperature: 37
pCO2, Ven: 44 mmHg (ref 44.0–60.0)
pH, Ven: 7.35 (ref 7.250–7.430)
pO2, Ven: 57 mmHg — ABNORMAL HIGH (ref 32.0–45.0)

## 2018-12-07 LAB — CBC WITH DIFFERENTIAL/PLATELET
Abs Immature Granulocytes: 0.39 10*3/uL — ABNORMAL HIGH (ref 0.00–0.07)
Basophils Absolute: 0.1 10*3/uL (ref 0.0–0.1)
Basophils Relative: 0 %
Eosinophils Absolute: 0.1 10*3/uL (ref 0.0–0.5)
Eosinophils Relative: 0 %
HCT: 30.4 % — ABNORMAL LOW (ref 36.0–46.0)
Hemoglobin: 9 g/dL — ABNORMAL LOW (ref 12.0–15.0)
Immature Granulocytes: 2 %
Lymphocytes Relative: 3 %
Lymphs Abs: 0.7 10*3/uL (ref 0.7–4.0)
MCH: 28.8 pg (ref 26.0–34.0)
MCHC: 29.6 g/dL — ABNORMAL LOW (ref 30.0–36.0)
MCV: 97.4 fL (ref 80.0–100.0)
Monocytes Absolute: 1.7 10*3/uL — ABNORMAL HIGH (ref 0.1–1.0)
Monocytes Relative: 8 %
Neutro Abs: 18.9 10*3/uL — ABNORMAL HIGH (ref 1.7–7.7)
Neutrophils Relative %: 87 %
Platelets: 353 10*3/uL (ref 150–400)
RBC: 3.12 MIL/uL — ABNORMAL LOW (ref 3.87–5.11)
RDW: 16.5 % — ABNORMAL HIGH (ref 11.5–15.5)
WBC: 21.9 10*3/uL — ABNORMAL HIGH (ref 4.0–10.5)
nRBC: 0.2 % (ref 0.0–0.2)

## 2018-12-07 LAB — COMPREHENSIVE METABOLIC PANEL
ALT: 15 U/L (ref 0–44)
AST: 33 U/L (ref 15–41)
Albumin: 2.3 g/dL — ABNORMAL LOW (ref 3.5–5.0)
Alkaline Phosphatase: 86 U/L (ref 38–126)
Anion gap: 12 (ref 5–15)
BUN: 22 mg/dL (ref 8–23)
CO2: 21 mmol/L — ABNORMAL LOW (ref 22–32)
Calcium: 8.3 mg/dL — ABNORMAL LOW (ref 8.9–10.3)
Chloride: 101 mmol/L (ref 98–111)
Creatinine, Ser: 0.93 mg/dL (ref 0.44–1.00)
GFR calc Af Amer: >60 mL/min (ref >60)
GFR calc non Af Amer: >60 mL/min (ref >60)
Glucose, Bld: 233 mg/dL — ABNORMAL HIGH (ref 70–99)
Potassium: 4.9 mmol/L (ref 3.5–5.1)
Sodium: 134 mmol/L — ABNORMAL LOW (ref 135–145)
Total Bilirubin: 0.6 mg/dL (ref 0.3–1.2)
Total Protein: 8 g/dL (ref 6.5–8.1)

## 2018-12-07 LAB — LACTIC ACID, PLASMA
Lactic Acid, Venous: 1.4 mmol/L (ref 0.5–1.9)
Lactic Acid, Venous: 1.4 mmol/L (ref 0.5–1.9)

## 2018-12-07 LAB — TROPONIN I: Troponin I: 0.07 ng/mL (ref <0.03)

## 2018-12-07 LAB — GLUCOSE, CAPILLARY: Glucose-Capillary: 219 mg/dL — ABNORMAL HIGH (ref 70–99)

## 2018-12-07 LAB — SARS CORONAVIRUS 2 BY RT PCR (HOSPITAL ORDER, PERFORMED IN ~~LOC~~ HOSPITAL LAB): SARS Coronavirus 2: NEGATIVE

## 2018-12-07 MED ORDER — INSULIN ASPART 100 UNIT/ML ~~LOC~~ SOLN
0.0000 [IU] | Freq: Three times a day (TID) | SUBCUTANEOUS | Status: DC
  Administered 2018-12-08: 3 [IU] via SUBCUTANEOUS
  Administered 2018-12-08 (×2): 7 [IU] via SUBCUTANEOUS
  Administered 2018-12-08: 2 [IU] via SUBCUTANEOUS
  Administered 2018-12-09 (×2): 9 [IU] via SUBCUTANEOUS
  Administered 2018-12-09 (×2): 5 [IU] via SUBCUTANEOUS
  Administered 2018-12-10: 09:00:00 9 [IU] via SUBCUTANEOUS
  Administered 2018-12-10: 2 [IU] via SUBCUTANEOUS
  Administered 2018-12-10: 17:00:00 5 [IU] via SUBCUTANEOUS
  Administered 2018-12-10: 21:00:00 7 [IU] via SUBCUTANEOUS
  Administered 2018-12-11: 18:00:00 9 [IU] via SUBCUTANEOUS
  Administered 2018-12-11 – 2018-12-12 (×3): 3 [IU] via SUBCUTANEOUS
  Administered 2018-12-12 – 2018-12-13 (×3): 1 [IU] via SUBCUTANEOUS
  Administered 2018-12-13: 5 [IU] via SUBCUTANEOUS
  Administered 2018-12-13: 7 [IU] via SUBCUTANEOUS
  Administered 2018-12-14: 1 [IU] via SUBCUTANEOUS
  Administered 2018-12-14: 5 [IU] via SUBCUTANEOUS
  Administered 2018-12-14: 23:00:00 7 [IU] via SUBCUTANEOUS
  Administered 2018-12-15 (×2): 5 [IU] via SUBCUTANEOUS
  Administered 2018-12-15: 22:00:00 7 [IU] via SUBCUTANEOUS
  Administered 2018-12-15: 3 [IU] via SUBCUTANEOUS
  Administered 2018-12-16: 09:00:00 2 [IU] via SUBCUTANEOUS
  Administered 2018-12-16: 9 [IU] via SUBCUTANEOUS
  Administered 2018-12-16 – 2018-12-17 (×3): 2 [IU] via SUBCUTANEOUS
  Administered 2018-12-17: 21:00:00 7 [IU] via SUBCUTANEOUS
  Administered 2018-12-17 (×2): 1 [IU] via SUBCUTANEOUS
  Administered 2018-12-18: 17:00:00 7 [IU] via SUBCUTANEOUS
  Administered 2018-12-18: 12:00:00 3 [IU] via SUBCUTANEOUS
  Filled 2018-12-07 (×39): qty 1

## 2018-12-07 MED ORDER — OXYCODONE HCL 10 MG PO TABS: 10.0000 mg | ORAL_TABLET | Freq: Four times a day (QID) | ORAL | 0 refills | Status: DC | PRN

## 2018-12-07 MED ORDER — ALPRAZOLAM 0.5 MG PO TABS
0.5000 mg | ORAL_TABLET | Freq: Three times a day (TID) | ORAL | Status: DC | PRN
  Administered 2018-12-08 – 2018-12-09 (×2): 0.5 mg via ORAL
  Filled 2018-12-07 (×3): qty 1

## 2018-12-07 MED ORDER — VANCOMYCIN HCL IN DEXTROSE 1-5 GM/200ML-% IV SOLN
1000.0000 mg | Freq: Once | INTRAVENOUS | Status: AC
  Administered 2018-12-07: 17:00:00 1000 mg via INTRAVENOUS
  Filled 2018-12-07: qty 200

## 2018-12-07 MED ORDER — ACETAMINOPHEN 325 MG PO TABS
650.0000 mg | ORAL_TABLET | Freq: Four times a day (QID) | ORAL | Status: DC | PRN
  Administered 2018-12-08: 650 mg via ORAL
  Filled 2018-12-07: qty 2

## 2018-12-07 MED ORDER — METOPROLOL TARTRATE 25 MG PO TABS
25.0000 mg | ORAL_TABLET | Freq: Two times a day (BID) | ORAL | Status: DC
  Administered 2018-12-08 – 2018-12-18 (×22): 25 mg via ORAL
  Filled 2018-12-07 (×22): qty 1

## 2018-12-07 MED ORDER — IPRATROPIUM-ALBUTEROL 0.5-2.5 (3) MG/3ML IN SOLN: 3.0000 mL | Freq: Four times a day (QID) | RESPIRATORY_TRACT | Status: DC | PRN

## 2018-12-07 MED ORDER — ALPRAZOLAM 0.5 MG PO TABS: 0.5000 mg | ORAL_TABLET | Freq: Three times a day (TID) | ORAL | 0 refills | Status: DC | PRN

## 2018-12-07 MED ORDER — ENOXAPARIN SODIUM 40 MG/0.4ML ~~LOC~~ SOLN
40.0000 mg | SUBCUTANEOUS | Status: DC
  Administered 2018-12-08 – 2018-12-17 (×10): 40 mg via SUBCUTANEOUS
  Filled 2018-12-07 (×10): qty 0.4

## 2018-12-07 MED ORDER — OXYCODONE HCL ER 10 MG PO T12A
20.0000 mg | EXTENDED_RELEASE_TABLET | Freq: Two times a day (BID) | ORAL | Status: DC
  Administered 2018-12-08 – 2018-12-18 (×21): 20 mg via ORAL
  Filled 2018-12-07 (×21): qty 2

## 2018-12-07 MED ORDER — OXYCODONE HCL 5 MG PO TABS
10.0000 mg | ORAL_TABLET | Freq: Four times a day (QID) | ORAL | Status: DC | PRN
  Administered 2018-12-08 – 2018-12-16 (×2): 10 mg via ORAL
  Filled 2018-12-07 (×2): qty 2

## 2018-12-07 MED ORDER — PANTOPRAZOLE SODIUM 40 MG PO TBEC
40.0000 mg | DELAYED_RELEASE_TABLET | Freq: Every day | ORAL | Status: DC
  Administered 2018-12-08 – 2018-12-18 (×11): 40 mg via ORAL
  Filled 2018-12-07 (×11): qty 1

## 2018-12-07 MED ORDER — OXYCODONE HCL ER 20 MG PO T12A: 20.0000 mg | EXTENDED_RELEASE_TABLET | Freq: Two times a day (BID) | ORAL | 0 refills | Status: DC

## 2018-12-07 MED ORDER — ASPIRIN 81 MG PO CHEW
81.0000 mg | CHEWABLE_TABLET | Freq: Every day | ORAL | Status: DC
  Administered 2018-12-08 – 2018-12-18 (×11): 81 mg via ORAL
  Filled 2018-12-07 (×11): qty 1

## 2018-12-07 MED ORDER — IPRATROPIUM-ALBUTEROL 0.5-2.5 (3) MG/3ML IN SOLN: 3.0000 mL | Freq: Once | RESPIRATORY_TRACT | Status: DC

## 2018-12-07 MED ORDER — IPRATROPIUM-ALBUTEROL 20-100 MCG/ACT IN AERS
2.0000 | INHALATION_SPRAY | Freq: Once | RESPIRATORY_TRACT | Status: AC
  Administered 2018-12-07: 15:00:00 2 via RESPIRATORY_TRACT
  Filled 2018-12-07: qty 4

## 2018-12-07 MED ORDER — AZTREONAM 1 G IJ SOLR
1.0000 g | Freq: Three times a day (TID) | INTRAMUSCULAR | Status: DC
  Filled 2018-12-07 (×2): qty 1

## 2018-12-07 MED ORDER — ONDANSETRON HCL 4 MG/2ML IJ SOLN: 4.0000 mg | Freq: Four times a day (QID) | INTRAMUSCULAR | Status: DC | PRN

## 2018-12-07 MED ORDER — ATORVASTATIN CALCIUM 20 MG PO TABS
40.0000 mg | ORAL_TABLET | Freq: Every day | ORAL | Status: DC
  Administered 2018-12-08 – 2018-12-18 (×11): 40 mg via ORAL
  Filled 2018-12-07 (×11): qty 2

## 2018-12-07 MED ORDER — MEGESTROL ACETATE 40 MG PO TABS: 40.0000 mg | ORAL_TABLET | Freq: Three times a day (TID) | ORAL | 0 refills | Status: AC

## 2018-12-07 MED ORDER — LEVOFLOXACIN IN D5W 750 MG/150ML IV SOLN
750.0000 mg | Freq: Once | INTRAVENOUS | Status: AC
  Administered 2018-12-07: 750 mg via INTRAVENOUS
  Filled 2018-12-07: qty 150

## 2018-12-07 MED ORDER — HYDROXYCHLOROQUINE SULFATE 200 MG PO TABS
200.0000 mg | ORAL_TABLET | Freq: Two times a day (BID) | ORAL | Status: DC
  Administered 2018-12-08 – 2018-12-18 (×22): 200 mg via ORAL
  Filled 2018-12-07 (×23): qty 1

## 2018-12-07 MED ORDER — ONDANSETRON HCL 4 MG PO TABS
4.0000 mg | ORAL_TABLET | Freq: Four times a day (QID) | ORAL | Status: DC | PRN
  Administered 2018-12-14: 4 mg via ORAL
  Filled 2018-12-07: qty 1

## 2018-12-07 MED ORDER — INSULIN GLARGINE 100 UNIT/ML ~~LOC~~ SOLN
15.0000 [IU] | Freq: Every morning | SUBCUTANEOUS | Status: DC
  Administered 2018-12-08 – 2018-12-09 (×2): 15 [IU] via SUBCUTANEOUS
  Filled 2018-12-07 (×2): qty 0.15

## 2018-12-07 MED ORDER — SODIUM CHLORIDE 0.9 % IV SOLN
INTRAVENOUS | Status: DC
  Administered 2018-12-08: via INTRAVENOUS

## 2018-12-07 MED ORDER — ACETAMINOPHEN 650 MG RE SUPP: 650.0000 mg | Freq: Four times a day (QID) | RECTAL | Status: DC | PRN

## 2018-12-07 MED ORDER — SODIUM CHLORIDE 0.9 % IV SOLN
1.0000 g | Freq: Three times a day (TID) | INTRAVENOUS | Status: DC
  Administered 2018-12-07 – 2018-12-08 (×2): 1 g via INTRAVENOUS
  Filled 2018-12-07 (×7): qty 1

## 2018-12-07 MED ORDER — AMLODIPINE BESYLATE 5 MG PO TABS
5.0000 mg | ORAL_TABLET | Freq: Every day | ORAL | Status: DC
  Administered 2018-12-08 – 2018-12-18 (×11): 5 mg via ORAL
  Filled 2018-12-07 (×11): qty 1

## 2018-12-07 NOTE — Progress Notes (Signed)
Pt switched from NRB mask to HFNC at 8 lpm. O2 sat 97%.

## 2018-12-07 NOTE — H&P (Signed)
[facility] NAME: Adryan Druckenmiller    MR#:  [id number]  DATE OF BIRTH:  1955-10-11  DATE OF ADMISSION:  12/07/2018  PRIMARY CARE PHYSICIAN: Glendon Axe, MD   REQUESTING/REFERRING PHYSICIAN: Joni Fears, MD  CHIEF COMPLAINT:   Chief Complaint  Patient presents with  . Respiratory Distress    HISTORY OF PRESENT ILLNESS:  Savonna Birchmeier  is a 63 y.o. female who presents with chief complaint as above.  Patient presents the ED today in respiratory distress.  She has been admitted to the hospital 2 times very recently with presumed pneumonia.  Imaging tonight looks like persistent pneumonia.  However, she does have a history of lung cancer, raising concern for possible malignant spread.  Hospitalist called for admission  PAST MEDICAL HISTORY:   Past Medical History:  Diagnosis Date  . Cancer ([place])    lung ca DX 2019  . Collagen vascular disease ([facility])    RA  . DM2 (diabetes mellitus, type 2) ([place])   . Dyspnea   . Hyperlipemia   . Hypertension   . Osteomyelitis ([place])   . Pneumonia   . RA (rheumatoid arthritis) ([place])   . Rheumatoid arthritis ([place])      PAST SURGICAL HISTORY:   Past Surgical History:  Procedure Laterality Date  . ENDOBRONCHIAL ULTRASOUND N/A 05/13/2018   Procedure: ENDOBRONCHIAL ULTRASOUND;  Surgeon: [name], MD;  Location: [facility];  Service: Pulmonary;  Laterality: N/A;  . PORTA CATH INSERTION N/A 05/27/2018   Procedure: PORTA CATH INSERTION;  Surgeon: Katha Cabal, MD;  Location: [facility];  Service: Cardiovascular;  Laterality: N/A;  . TOE SURGERY       SOCIAL HISTORY:   Social History   Tobacco Use  . Smoking status: Former Smoker    Packs/day: 1.00    Years: 40.00    Pack years: 40.00    Types: Cigarettes    Last attempt to quit: 11/22/2017    Years since quitting: 1.0  . Smokeless tobacco: Never Used  . Tobacco comment: quit 2 weeks ago  Substance Use  Topics  . Alcohol use: Never    Frequency: Never     FAMILY HISTORY:   Family History  Problem Relation Age of Onset  . Diabetes Mother   . Lung cancer Father      DRUG ALLERGIES:   Allergies  Allergen Reactions  . Ibuprofen Other (See Comments)    Is not supposed to take because of her kidneys.  . Penicillins Other (See Comments)    Has patient had a PCN reaction causing immediate rash, facial/tongue/throat swelling, SOB or lightheadedness with hypotension: Unknown Has patient had a PCN reaction causing severe rash involving mucus membranes or skin necrosis: Unknown Has patient had a PCN reaction that required hospitalization: Unknown Has patient had a PCN reaction occurring within the last 10 years: Unknown If all of the above answers are "NO", then may proceed with Cephalosporin use.    MEDICATIONS AT HOME:   Prior to Admission medications   Medication Sig Start Date End Date Taking? Authorizing Provider  ALPRAZolam Duanne Moron) 0.5 MG tablet Take 1 tablet (0.5 mg total) by mouth 3 (three) times daily as needed for anxiety. 12/07/18  Yes Earlie Server, MD  amLODipine (NORVASC) 5 MG tablet Take 5 mg by mouth daily. 01/20/18  Yes [provider]  aspirin 81 MG chewable tablet Chew 1 tablet by mouth daily.   Yes [provider]  atorvastatin (LIPITOR) 40 MG tablet Take 40 mg by mouth daily. 01/20/18  Yes [provider]  ferrous sulfate 325 (65 FE) MG EC tablet Take 1 tablet (325 mg total) by mouth 2 (two) times daily with a meal. 10/12/18  Yes Earlie Server, MD  folic acid (FOLVITE) 1 MG tablet Take 1 mg by mouth daily. 01/20/18  Yes [provider]  hydroxychloroquine (PLAQUENIL) 200 MG tablet Take 200 mg by mouth 2 (two) times daily.  05/31/18  Yes [provider]  Insulin Glargine (BASAGLAR KWIKPEN) 100 UNIT/ML SOPN Inject 0.1 mLs (10 Units total) into the skin every morning. Patient taking differently: Inject 38 Units into the skin every morning.   11/22/18  Yes Mody, Sital, MD  levofloxacin (LEVAQUIN) 750 MG tablet Take 1 tablet (750 mg total) by mouth daily for 3 days. 12/04/18 12/07/18 Yes Lang Snow, NP  lisinopril (PRINIVIL,ZESTRIL) 40 MG tablet Take 40 mg by mouth daily. 01/20/18  Yes [provider]  metoprolol tartrate (LOPRESSOR) 25 MG tablet Take 25 mg by mouth 2 (two) times daily. 01/20/18  Yes [provider]  mirtazapine (REMERON) 7.5 MG tablet Take 1 tablet (7.5 mg total) by mouth at bedtime. 12/05/18  Yes Earlie Server, MD  omeprazole (PRILOSEC) 20 MG capsule Take 1 capsule (20 mg total) by mouth daily. 07/12/18  Yes Earlie Server, MD  oxyCODONE (OXYCONTIN) 20 mg 12 hr tablet Take 1 tablet (20 mg total) by mouth every 12 (twelve) hours. 12/07/18  Yes Earlie Server, MD  Oxycodone HCl 10 MG TABS Take 1 tablet (10 mg total) by mouth every 6 (six) hours as needed for up to 3 days. 12/07/18 12/10/18 Yes Earlie Server, MD  SYMBICORT 160-4.5 MCG/ACT inhaler INHALE 2 PUFFS BY MOUTH 2X A DAY Patient taking differently: Inhale 2 puffs into the lungs daily.  12/05/18  Yes [name], MD  triamterene-hydrochlorothiazide (DYAZIDE) 37.5-25 MG capsule Take 1 capsule by mouth daily. 01/20/18  Yes [provider]  albuterol (PROVENTIL HFA;VENTOLIN HFA) 108 (90 Base) MCG/ACT inhaler Inhale 2 puffs into the lungs every 6 (six) hours as needed for wheezing or shortness of breath. 08/23/18   Earlie Server, MD  albuterol (PROVENTIL) (2.5 MG/3ML) 0.083% nebulizer solution Take 3 mLs (2.5 mg total) by nebulization every 6 (six) hours as needed for wheezing or shortness of breath. 11/22/18   Bettey Costa, MD  dexamethasone (DECADRON) 4 MG tablet Take 2 tablets (8 mg total) by mouth daily. Start the day after chemotherapy for 2 days. Patient not taking: Reported on 12/07/2018 05/25/18   Earlie Server, MD  feeding supplement, ENSURE ENLIVE, (ENSURE ENLIVE) LIQD Take 237 mLs by mouth 2 (two) times daily between meals. 11/22/18   Bettey Costa, MD   megestrol (MEGACE) 40 MG tablet Take 1 tablet (40 mg total) by mouth 3 (three) times daily. 12/07/18   Earlie Server, MD  [facility] 4 MG/0.1ML LIQD nasal spray kit Place 1 spray into the nose once.  08/03/18   [provider]  nystatin (MYCOSTATIN) 100000 UNIT/ML suspension Take 5 mLs (500,000 Units total) by mouth 4 (four) times daily. 11/02/18   Earlie Server, MD  ondansetron (ZOFRAN) 8 MG tablet Take 1 tablet (8 mg total) by mouth 2 (two) times daily as needed for refractory nausea / vomiting. Start on day 3 after chemo. 08/23/18   Earlie Server, MD  prochlorperazine (COMPAZINE) 10 MG tablet Take 1 tablet (10 mg total) by mouth every 6 (six) hours as needed (Nausea or vomiting). 05/25/18  Earlie Server, MD  sucralfate (CARAFATE) 1 g tablet Take 1 tablet (1 g total) by mouth 3 (three) times daily for 5 days. Dissolve in 3-4 tbsp warm water, swish and swallow 10/28/18 11/02/18  Ripley Fraise, PA    REVIEW OF SYSTEMS:  Review of Systems  Constitutional: Positive for malaise/fatigue. Negative for chills, fever and weight loss.  HENT: Negative for ear pain, hearing loss and tinnitus.   Eyes: Negative for blurred vision, double vision, pain and redness.  Respiratory: Positive for shortness of breath. Negative for cough and hemoptysis.   Cardiovascular: Negative for chest pain, palpitations, orthopnea and leg swelling.  Gastrointestinal: Negative for abdominal pain, constipation, diarrhea, nausea and vomiting.  Genitourinary: Negative for dysuria, frequency and hematuria.  Musculoskeletal: Negative for back pain, joint pain and neck pain.  Skin:       No acne, rash, or lesions  Neurological: Negative for dizziness, tremors, focal weakness and weakness.  Endo/Heme/Allergies: Negative for polydipsia. Does not bruise/bleed easily.  Psychiatric/Behavioral: Negative for depression. The patient is not nervous/anxious and does not have insomnia.      VITAL SIGNS:   Vitals:   12/07/18 2030 12/07/18 2045 12/07/18 2100  12/07/18 2115  BP: 114/77 112/89 (!) 144/76 (!) 105/95  Pulse: 96 95 96 97  Resp: (!) 23 (!) 23 (!) 23 (!) 22  Temp:      TempSrc:      SpO2: 100% 100% 100% 100%  Weight:      Height:       Wt Readings from Last 3 Encounters:  12/07/18 81.6 kg  11/29/18 85.5 kg  11/20/18 85.5 kg    PHYSICAL EXAMINATION:  Physical Exam  Vitals reviewed. Constitutional: She is oriented to person, place, and time. She appears well-developed and well-nourished. No distress.  HENT:  Head: Normocephalic and atraumatic.  Mouth/Throat: Oropharynx is clear and moist.  Eyes: Pupils are equal, round, and reactive to light. Conjunctivae and EOM are normal. No scleral icterus.  Neck: Normal range of motion. Neck supple. No JVD present. No thyromegaly present.  Cardiovascular: Regular rhythm and intact distal pulses. Exam reveals no gallop and no friction rub.  No murmur heard. tachycardic  Respiratory: She is in respiratory distress. She has no wheezes. She has no rales.  ronchi  GI: Soft. Bowel sounds are normal. She exhibits no distension. There is no abdominal tenderness.  Musculoskeletal: Normal range of motion.        General: No edema.     Comments: No arthritis, no gout  Lymphadenopathy:    She has no cervical adenopathy.  Neurological: She is alert and oriented to person, place, and time. No cranial nerve deficit.  No dysarthria, no aphasia  Skin: Skin is warm and dry. No rash noted. No erythema.  Psychiatric: She has a normal mood and affect. Her behavior is normal. Judgment and thought content normal.    LABORATORY PANEL:   CBC Recent Labs  Lab 12/07/18 1354  WBC 21.9*  HGB 9.0*  HCT 30.4*  PLT 353   ------------------------------------------------------------------------------------------------------------------  Chemistries  Recent Labs  Lab 12/07/18 1354  NA 134*  K 4.9  CL 101  CO2 21*  GLUCOSE 233*  BUN 22  CREATININE 0.93  CALCIUM 8.3*  AST 33  ALT 15  ALKPHOS  86  BILITOT 0.6   ------------------------------------------------------------------------------------------------------------------  Cardiac Enzymes Recent Labs  Lab 12/07/18 1354  TROPONINI 0.07*   ------------------------------------------------------------------------------------------------------------------  RADIOLOGY:  Dg Chest Port 1 View  Result Date: 12/07/2018 CLINICAL DATA:  Fever and respiratory distress EXAM: PORTABLE CHEST 1 VIEW COMPARISON:  12/03/2018 FINDINGS: Slightly decreased lung volumes noted with stable to slight increase in bilateral interstitial/airspace opacities. LEFT pleural effusion is again noted with LEFT hemithorax volume loss. A RIGHT IJ Port-A-Cath is again identified with tip overlying the mid SVC. No pneumothorax. IMPRESSION: Decreased lung volumes with stable to slight increase in diffuse bilateral interstitial/airspace opacities. No other significant change. Electronically Signed   By: Margarette Canada M.D.   On: 12/07/2018 15:16    EKG:   Orders placed or performed during the hospital encounter of 12/07/18  . EKG 12-Lead  . EKG 12-Lead    IMPRESSION AND PLAN:  Principal Problem:   Sepsis ([place]) -IV antibiotics given, lactic acid within normal limits, blood pressure stable, culture sent Active Problems:   Malignant neoplasm of lung ([place]) -concerned that her imaging is actually showing malignant spread of her cancer rather than pneumonia, or in addition to pneumonia.  Oncology consult   HCAP (healthcare-associated pneumonia) -IV antibiotics as above   Diabetes mellitus type 2, uncomplicated ([place]) -sliding scale insulin   Essential hypertension -home dose antihypertensives   Rheumatoid arthritis ([place]) -home dose DMARDs   Pure hypercholesterolemia -home dose antilipid  Chart review performed and case discussed with ED provider. Labs, imaging and/or ECG reviewed by provider and discussed with patient/family. Management plans discussed with the  patient and/or family.  DVT PROPHYLAXIS: SubQ lovenox   GI PROPHYLAXIS:  None  ADMISSION STATUS: Inpatient     CODE STATUS: Full Code Status History    Date Active Date Inactive Code Status Order ID Comments User Context   11/29/2018 2250 12/04/2018 1628 Full Code [id number]  Lance Coon, MD Inpatient   11/20/2018 2045 11/22/2018 1806 Full Code [id number]  Loletha Grayer, MD ED   10/26/2018 2037 10/28/2018 2050 Partial Code [id number]  Hillary Bow, MD Inpatient   10/26/2018 1728 10/26/2018 2037 DNR [id number]  Hillary Bow, MD ED   02/21/2018 2004 02/24/2018 1542 Full Code [id number]  Gladstone Lighter, MD ED   02/07/2018 2202 02/11/2018 1738 Full Code [id number]  Dustin Flock, MD Inpatient      TOTAL TIME TAKING CARE OF THIS PATIENT: 45 minutes.   Ethlyn Daniels 12/07/2018, 9:56 PM  [facility]  Office  [phone]  CC: Primary care physician; Glendon Axe, MD  Note:  This document was prepared using Dragon voice recognition software and may include unintentional dictation errors.

## 2018-12-07 NOTE — ED Notes (Signed)
Dr Jimmye Norman notified of elevated troponin of 0.07 - no new orders at this time

## 2018-12-07 NOTE — Progress Notes (Signed)
HEMATOLOGY-ONCOLOGY TeleHEALTH VISIT PROGRESS NOTE  I connected with Tamara Parrish on 12/07/18 at  9:45 AM EDT by video enabled telemedicine visit and verified that I am speaking with the correct person using two identifiers. I discussed the limitations, risks, security and privacy concerns of performing an evaluation and management service by telemedicine and the availability of in-person appointments. I also discussed with the patient that there may be a patient responsible charge related to this service. The patient expressed understanding and agreed to proceed.   Other persons participating in the visit and their role in the encounter:  Geraldine Solar, CMA, check in patient   Daughter Theadora Rama   Patient's location: Home  Provider's location: Home  Chief Complaint: post hospitalization follow up and management of cancer related symptoms.    INTERVAL HISTORY Tamara Parrish is a 63 y.o. female who has above history reviewed by me today presents for follow up visit for post hospitalization follow up and management of cancer related symptoms.  Problems and complaints are listed below:  Reports feeling very anxious today, tearful. Shortness of breath is worse since discharged. She finished course of Levaquin.   Appetite is very poor not eating much. NO fever, chills.  Reports worsening bilateral lower extremity swelling.  Generalized body/joint pain, takes Oxycontin 62m Q12h and Oxycodone 135mevery 4 hours.  Reports pain at 7 out of 10.   Review of Systems  Constitutional: Positive for appetite change, fatigue and unexpected weight change. Negative for chills and fever.  HENT:   Negative for hearing loss and voice change.   Eyes: Negative for eye problems.  Respiratory: Positive for cough and shortness of breath. Negative for chest tightness.   Cardiovascular: Positive for leg swelling. Negative for chest pain.  Gastrointestinal: Negative for abdominal distention, abdominal pain  and blood in stool.  Endocrine: Negative for hot flashes.  Genitourinary: Negative for difficulty urinating and frequency.   Musculoskeletal: Positive for arthralgias.  Skin: Negative for itching and rash.  Neurological: Negative for extremity weakness.  Hematological: Negative for adenopathy.  Psychiatric/Behavioral: Negative for confusion. The patient is nervous/anxious.     Past Medical History:  Diagnosis Date  . Cancer ([facility]   lung ca DX 2019  . Collagen vascular disease (HCC)    RA  . DM2 (diabetes mellitus, type 2) ([facility]  . Dyspnea   . Hyperlipemia   . Hypertension   . Osteomyelitis ([facility]  . Pneumonia   . RA (rheumatoid arthritis) ([facility]  . [facility]   Past Surgical History:  Procedure Laterality Date  . ENDOBRONCHIAL ULTRASOUND N/A 05/13/2018   Procedure: ENDOBRONCHIAL ULTRASOUND;  Surgeon: RaLaverle HobbyMD;  Location[facility]S;  Service: Pulmonary;  Laterality: N/A;  . PORTA CATH INSERTION N/A 05/27/2018   Procedure: PORTA CATH INSERTION;  Surgeon: ScKatha CabalMD;  [facility];  Service: Cardiovascular;  Laterality: N/A;  . TOE SURGERY      Family History  Problem Relation Age of Onset  . Diabetes Mother   . Lung cancer Father     Social History   Socioeconomic History  . Marital status: Single    Spouse name: Not on file  . Number of children: 2  . Years of education: Not on file  . Highest education level: Not on file  Occupational History  . Not on file  Social Needs  . Financial resource strain: Not very hard  . Food insecurity:    Worry:  Never true    Inability: Never true  . Transportation needs:    Medical: No    Non-medical: No  Tobacco Use  . Smoking status: Former Smoker    Packs/day: 1.00    Years: 40.00    Pack years: 40.00    Types: Cigarettes    Last attempt to quit: 11/22/2017    Years since quitting: 1.0  . Smokeless tobacco: Never Used  . Tobacco comment: quit 2 weeks ago   Substance and Sexual Activity  . Alcohol use: Never    Frequency: Never  . Drug use: Never  . Sexual activity: Not Currently  Lifestyle  . Physical activity:    Days per week: 0 days    Minutes per session: Not on file  . Stress: To some extent  Relationships  . Social connections:    Talks on phone: Three times a week    Gets together: More than three times a week    Attends religious service: Not on file    Active member of club or organization: No    Attends meetings of clubs or organizations: Never    Relationship status: Never married  . Intimate partner violence:    Fear of current or ex partner: No    Emotionally abused: No    Physically abused: No    Forced sexual activity: No  Other Topics Concern  . Not on file  Social History Narrative   Living at home with daughter.  Ambulates with a walker at baseline.    No current facility-administered medications on file prior to visit.    Current Outpatient Medications on File Prior to Visit  Medication Sig Dispense Refill  . albuterol (PROVENTIL HFA;VENTOLIN HFA) 108 (90 Base) MCG/ACT inhaler Inhale 2 puffs into the lungs every 6 (six) hours as needed for wheezing or shortness of breath. 1 Inhaler 2  . albuterol (PROVENTIL) (2.5 MG/3ML) 0.083% nebulizer solution Take 3 mLs (2.5 mg total) by nebulization every 6 (six) hours as needed for wheezing or shortness of breath. 75 mL 12  . amLODipine (NORVASC) 5 MG tablet Take 5 mg by mouth daily.  3  . aspirin 81 MG chewable tablet Chew 1 tablet by mouth daily.    Marland Kitchen atorvastatin (LIPITOR) 40 MG tablet Take 40 mg by mouth daily.  3  . feeding supplement, ENSURE ENLIVE, (ENSURE ENLIVE) LIQD Take 237 mLs by mouth 2 (two) times daily between meals. 237 mL 12  . ferrous sulfate 325 (65 FE) MG EC tablet Take 1 tablet (325 mg total) by mouth 2 (two) times daily with a meal. 60 tablet 3  . folic acid (FOLVITE) 1 MG tablet Take 1 mg by mouth daily.  11  . hydroxychloroquine (PLAQUENIL) 200  MG tablet Take 200 mg by mouth 2 (two) times daily.     . Insulin Glargine (BASAGLAR KWIKPEN) 100 UNIT/ML SOPN Inject 0.1 mLs (10 Units total) into the skin every morning. (Patient taking differently: Inject 38 Units into the skin every morning. )  12  . levofloxacin (LEVAQUIN) 750 MG tablet Take 1 tablet (750 mg total) by mouth daily for 3 days. 3 tablet 0  . lisinopril (PRINIVIL,ZESTRIL) 40 MG tablet Take 40 mg by mouth daily.  3  . metoprolol tartrate (LOPRESSOR) 25 MG tablet Take 25 mg by mouth 2 (two) times daily.  3  . mirtazapine (REMERON) 7.5 MG tablet Take 1 tablet (7.5 mg total) by mouth at bedtime. 30 tablet 3  . NARCAN 4 MG/0.1ML  LIQD nasal spray kit Place 1 spray into the nose once.     . nystatin (MYCOSTATIN) 100000 UNIT/ML suspension Take 5 mLs (500,000 Units total) by mouth 4 (four) times daily. 473 mL 0  . omeprazole (PRILOSEC) 20 MG capsule Take 1 capsule (20 mg total) by mouth daily. 90 capsule 0  . ondansetron (ZOFRAN) 8 MG tablet Take 1 tablet (8 mg total) by mouth 2 (two) times daily as needed for refractory nausea / vomiting. Start on day 3 after chemo. 30 tablet 2  . prochlorperazine (COMPAZINE) 10 MG tablet Take 1 tablet (10 mg total) by mouth every 6 (six) hours as needed (Nausea or vomiting). 30 tablet 1  . SYMBICORT 160-4.5 MCG/ACT inhaler INHALE 2 PUFFS BY MOUTH 2X A DAY (Patient taking differently: Inhale 2 puffs into the lungs daily. ) 10.2 Inhaler 0  . triamterene-hydrochlorothiazide (DYAZIDE) 37.5-25 MG capsule Take 1 capsule by mouth daily.  3  . dexamethasone (DECADRON) 4 MG tablet Take 2 tablets (8 mg total) by mouth daily. Start the day after chemotherapy for 2 days. (Patient not taking: Reported on 12/07/2018) 30 tablet 1  . sucralfate (CARAFATE) 1 g tablet Take 1 tablet (1 g total) by mouth 3 (three) times daily for 5 days. Dissolve in 3-4 tbsp warm water, swish and swallow 15 tablet 0    Allergies  Allergen Reactions  . Ibuprofen Other (See Comments)    Is  not supposed to take because of her kidneys.  . Penicillins Other (See Comments)    Has patient had a PCN reaction causing immediate rash, facial/tongue/throat swelling, SOB or lightheadedness with hypotension: Unknown Has patient had a PCN reaction causing severe rash involving mucus membranes or skin necrosis: Unknown Has patient had a PCN reaction that required hospitalization: Unknown Has patient had a PCN reaction occurring within the last 10 years: Unknown If all of the above answers are "NO", then may proceed with Cephalosporin use.       Observations/Objective: Today's Vitals   12/07/18 0956  PainSc: 7    There is no height or weight on file to calculate BMI.  Physical Exam  Constitutional: She is oriented to person, place, and time.  HENT:  Head: Normocephalic.  Pulmonary/Chest:  Mild respiratory distress. Breathing 3L [place] oxygen.   Neurological: She is alert and oriented to person, place, and time.  Skin: She is not diaphoretic.  Psychiatric:  Extremely anxious    CBC    Component Value Date/Time   WBC 21.9 (H) 12/07/2018 1354   RBC 3.12 (L) 12/07/2018 1354   HGB 9.0 (L) 12/07/2018 1354   HGB 11.2 (L) 02/12/2014 0347   HCT 30.4 (L) 12/07/2018 1354   HCT 33.7 (L) 02/12/2014 0347   PLT 353 12/07/2018 1354   PLT 404 02/12/2014 0347   MCV 97.4 12/07/2018 1354   MCV 91 02/12/2014 0347   MCH 28.8 12/07/2018 1354   MCHC 29.6 (L) 12/07/2018 1354   RDW 16.5 (H) 12/07/2018 1354   RDW 13.1 02/12/2014 0347   LYMPHSABS 0.7 12/07/2018 1354   LYMPHSABS 2.9 02/12/2014 0347   MONOABS 1.7 (H) 12/07/2018 1354   MONOABS 1.2 (H) 02/12/2014 0347   EOSABS 0.1 12/07/2018 1354   EOSABS 0.2 02/12/2014 0347   BASOSABS 0.1 12/07/2018 1354   BASOSABS 0.1 02/12/2014 0347    CMP     Component Value Date/Time   NA 134 (L) 12/07/2018 1354   NA 133 (L) 02/09/2014 0526   K 4.9 12/07/2018 1354   K  4.2 02/09/2014 0526   CL 101 12/07/2018 1354   CL 102 02/09/2014 0526   CO2 21 (L)  12/07/2018 1354   CO2 26 02/09/2014 0526   GLUCOSE 233 (H) 12/07/2018 1354   GLUCOSE 220 (H) 02/09/2014 0526   BUN 22 12/07/2018 1354   BUN 11 02/09/2014 0526   CREATININE 0.93 12/07/2018 1354   CREATININE 0.78 02/09/2014 0526   CALCIUM 8.3 (L) 12/07/2018 1354   CALCIUM 8.4 (L) 02/09/2014 0526   PROT 8.0 12/07/2018 1354   PROT 7.6 02/08/2014 1034   ALBUMIN 2.3 (L) 12/07/2018 1354   ALBUMIN 2.9 (L) 02/08/2014 1034   AST 33 12/07/2018 1354   AST 16 02/08/2014 1034   ALT 15 12/07/2018 1354   ALT 12 02/08/2014 1034   ALKPHOS 86 12/07/2018 1354   ALKPHOS 113 02/08/2014 1034   BILITOT 0.6 12/07/2018 1354   BILITOT 0.3 02/08/2014 1034   GFRNONAA >60 12/07/2018 1354   GFRNONAA >60 02/09/2014 0526   GFRAA >60 12/07/2018 1354   GFRAA >60 02/09/2014 0526     Assessment and Plan: 1. Malignant neoplasm of lung, unspecified laterality, unspecified part of lung ([place])   2. Anxiety   3. Rheumatoid arthritis, involving unspecified site, unspecified rheumatoid factor presence ([place])   4. Shortness of breath   5. Goals of care, counseling/discussion    # Recent hospitalization due to pneumonia, worsened SOB.  I am concerned about her worsening breathing status.Daughter reports that patient's anxiety may have worsened her breathing status. She is currently on 3L of oxygen. I discussed with patient and daughter that although anxiety may trigger SOB, respiratory distress also triggers anxiety.  Patient requests to have medication for anxiety. Will start trial of Xanax 0.30m PRN.  Discussed with patient and daughter that oral morphine can be used for symptom relief.   # Chronic pain has worsened, generalized joint pain due to RA, worsened.  Increase Oxycontin to 270mQ12h, conitnue oxycodone 105m4-6 h PRN.  # Poor appetite, start Megace 70m64mD.  # Stage IV lung cancer/ goal of care, Discussed with patient and daughter that anti-neoplasm treatment need to be held due to acute illness. She  appears to have declined a lot recently. Goal of care was discussed with patient. [facility] not discussed further today as patient appears to be more anxious with this topic. Patient is not fully decided about her code status.  I advise daughter to communicate with me about patient's condition, I recommend low threshold for patient to go to ER for further evaluation.     Follow Up Instructions: 1 week.    I discussed the assessment and treatment plan with the patient. The patient was provided an opportunity to ask questions and all were answered. The patient agreed with the plan and demonstrated an understanding of the instructions.  The patient was advised to call back or seek an in-person evaluation if the symptoms worsen or if the condition fails to improve as anticipated.   I provided 25 minutes of face-to-face video visit time during this encounter, and > 50% was spent counseling as documented under my assessment & plan.  ZhouEarlie Server 12/07/2018 10:15 PM

## 2018-12-07 NOTE — ED Notes (Signed)
Respiratory called Dr. Jannifer Franklin wants pt to be placed on high flow oxygen.

## 2018-12-07 NOTE — Progress Notes (Signed)
CODE SEPSIS - PHARMACY COMMUNICATION  **Broad Spectrum Antibiotics should be administered within 1 hour of Sepsis diagnosis**  Time Code Sepsis Called/Page Received: 1340  Antibiotics Ordered: aztreonam, levofloxacin, and vancomycin   Time of 1st antibiotic administration: levaquin at 1548  Additional action taken by pharmacy: Called RN Helene Kelp at 1425 about need to order abx - said would inform MD.  Called Dr Jimmye Norman at 1455 - MD not yet sure about abx  If necessary, Name of Provider/Nurse Contacted: see above    Tamara Parrish ,PharmD Clinical Pharmacist  12/07/2018  2:58 PM

## 2018-12-07 NOTE — ED Triage Notes (Signed)
Pt arrived via ems for report of respiratory distress - she has new oxygen requirement of 6L - she has dx of pneumonia and finished antibiotics today - she feels like she has not improved

## 2018-12-07 NOTE — Progress Notes (Signed)
Called patient for WebEx visit.  Patient was around a nurse while in patient last month who tested positive for [place], Patient was tested for [place] last week while inpatient, resulted as negative.  Patient daughter, Velna Hatchet, helps relay information as patient is extremely short of breath.  Velna Hatchet states that she feels mother has anxiety which could be causing shortness of breath.  I hear patient coughing in the background. Patient c/o bilateral edema in feet

## 2018-12-07 NOTE — ED Notes (Signed)
Pt given a warm blanket 

## 2018-12-07 NOTE — ED Notes (Signed)
Pt desat with good waveform - Dr Jimmye Norman notified and pt placed on non-rebreather at 10L

## 2018-12-07 NOTE — ED Notes (Signed)
When pt is talking on the phone her O2 sat drops in the mid 80's wearing 6L of O2 - pt refuses to stop talking on phone

## 2018-12-07 NOTE — ED Notes (Signed)
Pt is very hard stick and only has one IV line - antibiotics will have to be given one at a time

## 2018-12-07 NOTE — ED Provider Notes (Signed)
[facility] Emergency Department Provider Note       Time seen: ----------------------------------------- 1:41 PM on 12/07/2018 -----------------------------------------   I have reviewed the triage vital signs and the nursing notes.  HISTORY   Chief Complaint No chief complaint on file.    HPI Tamara Parrish is a 63 y.o. female with a history of recurrent pneumonia, polyarthralgia, thrombocytosis, rheumatoid arthritis, anemia, dehydration, lung cancer who presents to the ED for [facility] distress.  Patient has had a increased oxygen requirement, was recently admitted into the hospital for pneumonia.  Patient reports a recurrence in her symptoms and shortness of breath.  She has not had a fever or chills, denies vomiting or diarrhea.  Past Medical History:  Diagnosis Date  . Cancer ([place])    lung ca DX 2019  . Collagen vascular disease (HCC)    RA  . DM2 (diabetes mellitus, type 2) ([place])   . Dyspnea   . Hyperlipemia   . Hypertension   . Osteomyelitis ([place])   . Pneumonia   . RA (rheumatoid arthritis) ([place])   . Rheumatoid arthritis [facility])     Patient Active Problem List   Diagnosis Date Noted  . Sepsis ([place]) 11/29/2018  . Suspected Covid-19 Virus Infection 11/29/2018  . Palliative care encounter   . HCAP (healthcare-associated pneumonia) 11/20/2018  . Dehydration 11/02/2018  . Anemia 10/26/2018  . Encounter for antineoplastic chemotherapy 07/12/2018  . Rheumatoid arthritis ([place]) 07/12/2018  . Thrombocytosis ([place]) 07/12/2018  . Goals of care, counseling/discussion 05/25/2018  . Malignant neoplasm of lung ([place]) 05/25/2018  . Recurrent pneumonia 02/21/2018  . PNA (pneumonia) 02/07/2018  . Elevated erythrocyte sedimentation rate 12/16/2017  . Dry cough 08/19/2017  . Rheumatoid nodule of multiple sites ([place]) 08/19/2017  . Polyneuropathy associated with underlying disease ([place]) 05/17/2017  . Seasonal allergic rhinitis due to pollen 05/17/2017   . Essential hypertension 10/09/2016  . Pure hypercholesterolemia 10/09/2016  . Rheumatoid arthritis, seropositive ([place]) 03/27/2016  . Polyarthralgia 03/18/2016  . Diabetes mellitus type 2, uncomplicated ([place]) 40/98/1191  . Allergic rhinitis 05/18/2014  . Microalbuminuria 04/18/2014    Past Surgical History:  Procedure Laterality Date  . ENDOBRONCHIAL ULTRASOUND N/A 05/13/2018   Procedure: ENDOBRONCHIAL ULTRASOUND;  Surgeon: [name], MD;  Location: [facility] ORS;  Service: Pulmonary;  Laterality: N/A;  . PORTA CATH INSERTION N/A 05/27/2018   Procedure: PORTA CATH INSERTION;  Surgeon: Katha Cabal, MD;  Location: [place] [facility];  Service: Cardiovascular;  Laterality: N/A;  . TOE SURGERY      Allergies Ibuprofen and Penicillins  Social History Social History   Tobacco Use  . Smoking status: Former Smoker    Packs/day: 1.00    Years: 40.00    Pack years: 40.00    Types: Cigarettes    Last attempt to quit: 11/22/2017    Years since quitting: 1.0  . Smokeless tobacco: Never Used  . Tobacco comment: quit 2 weeks ago  Substance Use Topics  . Alcohol use: Never    Frequency: Never  . Drug use: Never   Review of Systems Constitutional: Negative for fever. Cardiovascular: Negative for chest pain. Respiratory: Positive for shortness of breath and cough Gastrointestinal: Negative for abdominal pain, vomiting and diarrhea. Genitourinary: Negative for dysuria. Musculoskeletal: Negative for back pain. Skin: Negative for rash. Neurological: Negative for headaches, focal weakness or numbness.  All systems negative/normal/unremarkable except as stated in the HPI  ____________________________________________   PHYSICAL EXAM:  VITAL SIGNS: ED Triage Vitals  Enc Vitals Group  BP      Pulse      Resp      Temp      Temp src      SpO2      Weight      Height      Head Circumference      Peak Flow      Pain Score      Pain Loc      Pain Edu?       Excl. in [place]?    Constitutional: Alert and oriented.  Mild to moderate distress, chronically ill-appearing Eyes: Conjunctivae are normal. Normal extraocular movements. ENT      Head: Normocephalic and atraumatic.      Nose: No congestion/rhinnorhea.      Mouth/Throat: Mucous membranes are moist.      Neck: No stridor. Cardiovascular: Normal rate, regular rhythm. No murmurs, rubs, or gallops. Respiratory: Tachypnea with rales and rhonchi, particular on the right Gastrointestinal: Soft and nontender. Normal bowel sounds Musculoskeletal: Nontender with normal range of motion in extremities. No lower extremity tenderness nor edema. Neurologic:  Normal speech and language. No gross focal neurologic deficits are appreciated.  Skin:  Skin is warm, dry and intact. No rash noted. Psychiatric: Mood and affect are normal. Speech and behavior are normal.  ____________________________________________  EKG: Interpreted by me.  Sinus tachycardia with rate of 120 bpm, PVCs, nonspecific ST changes, normal QT  ____________________________________________  ED COURSE:  As part of my medical decision making, I reviewed the following data within the [place] History obtained from family if available, nursing notes, old chart and ekg, as well as notes from prior ED visits. Patient presented for probable pneumonia, we will assess with labs and imaging as indicated at this time.   Procedures  Tamara Parrish was evaluated in Emergency Department on 12/07/2018 for the symptoms described in the history of present illness. She was evaluated in the context of the global COVID-19 pandemic, which necessitated consideration that the patient might be at risk for infection with the SARS-CoV-2 virus that causes COVID-19. Institutional protocols and algorithms that pertain to the evaluation of patients at risk for COVID-19 are in a state of rapid change based on information released by regulatory  bodies including the CDC and federal and state organizations. These policies and algorithms were followed during the patient's care in the ED.  ____________________________________________   LABS (pertinent positives/negatives)  Labs Reviewed  COMPREHENSIVE METABOLIC PANEL - Abnormal; Notable for the following components:      Result Value   Sodium 134 (*)    CO2 21 (*)    Glucose, Bld 233 (*)    Calcium 8.3 (*)    Albumin 2.3 (*)    All other components within normal limits  CBC WITH DIFFERENTIAL/PLATELET - Abnormal; Notable for the following components:   WBC 21.9 (*)    RBC 3.12 (*)    Hemoglobin 9.0 (*)    HCT 30.4 (*)    MCHC 29.6 (*)    RDW 16.5 (*)    Neutro Abs 18.9 (*)    Monocytes Absolute 1.7 (*)    Abs Immature Granulocytes 0.39 (*)    All other components within normal limits  BLOOD GAS, VENOUS - Abnormal; Notable for the following components:   pO2, Ven 57.0 (*)    All other components within normal limits  TROPONIN I - Abnormal; Notable for the following components:   Troponin I 0.07 (*)    All  other components within normal limits  CULTURE, BLOOD (ROUTINE X 2)  CULTURE, BLOOD (ROUTINE X 2)  URINE CULTURE  SARS CORONAVIRUS 2 (HOSPITAL ORDER, [place] [facility])  LACTIC ACID, PLASMA  LACTIC ACID, PLASMA  URINALYSIS, COMPLETE (UACMP) WITH MICROSCOPIC   CRITICAL CARE Performed by: Laurence Aly   Total critical care time: 30 minutes  Critical care time was exclusive of separately billable procedures and treating other patients.  Critical care was necessary to treat or prevent imminent or life-threatening deterioration.  Critical care was time spent personally by me on the following activities: development of treatment plan with patient and/or surrogate as well as nursing, discussions with consultants, evaluation of patient's response to treatment, examination of patient, obtaining history from patient or surrogate, ordering and  performing treatments and interventions, ordering and review of laboratory studies, ordering and review of radiographic studies, pulse oximetry and re-evaluation of patient's condition.  RADIOLOGY Images were viewed by me  Chest x-ray IMPRESSION: Decreased lung volumes with stable to slight increase in diffuse bilateral interstitial/airspace opacities. No other significant change. ____________________________________________   DIFFERENTIAL DIAGNOSIS   Pneumonia, coronavirus, dehydration, electrolyte abnormality, anemia, sepsis, lung cancer  FINAL ASSESSMENT AND PLAN  Acute respiratory distress, recurrent pneumonia   Plan: The patient had presented for acute respiratory distress. Patient's labs do indicate a worsening leukocytosis and elevated troponin, initial lactic acid level was negative. Patient's imaging reveals what appears to be an increase in diffuse bilateral airspace opacities.  We will recheck for coronavirus and treat for healthcare associated pneumonia.  She has been allergic so she received vancomycin, aztreonam and Levaquin.  She has recently been treated with Levaquin.  I will discuss with the hospitalist for admission.   Laurence Aly, MD    Note: This note was generated in part or whole with voice recognition software. Voice recognition is usually quite accurate but there are transcription errors that can and very often do occur. I apologize for any typographical errors that were not detected and corrected.     Earleen Newport, MD 12/07/18 1524

## 2018-12-07 NOTE — ED Notes (Signed)
ED TO INPATIENT HANDOFF REPORT  ED Nurse Name and Phone #: [place] [phone]  S Name/Age/Gender Tamara Parrish 63 y.o. female Room/Bed: ED06A/ED06A  Code Status   Code Status: Prior  Home/SNF/Other Home Patient oriented to: situation Is this baseline? Yes   Triage Complete: Triage complete  Chief Complaint respiratory distress  Triage Note Pt arrived via ems for report of respiratory distress - she has new oxygen requirement of 6L - she has dx of pneumonia and finished antibiotics today - she feels like she has not improved   Allergies Allergies  Allergen Reactions  . Ibuprofen Other (See Comments)    Is not supposed to take because of her kidneys.  . Penicillins Other (See Comments)    Has patient had a PCN reaction causing immediate rash, facial/tongue/throat swelling, SOB or lightheadedness with hypotension: Unknown Has patient had a PCN reaction causing severe rash involving mucus membranes or skin necrosis: Unknown Has patient had a PCN reaction that required hospitalization: Unknown Has patient had a PCN reaction occurring within the last 10 years: Unknown If all of the above answers are "NO", then may proceed with Cephalosporin use.    Level of Care/Admitting Diagnosis ED Disposition    ED Disposition Condition [facility] Area: [place] [[id number]]  Level of Care: Med-Surg [16]  Diagnosis: Sepsis [facility]) [[id number]]  Admitting Physician: Lance Coon [[id number]]  Attending Physician: Lance Coon [phone]  Estimated length of stay: past midnight tomorrow  Certification:: I certify this patient will need inpatient services for at least 2 midnights  Possible Covid Disease Patient Isolation: N/A  PT Class (Do Not Modify): Inpatient [101]  PT Acc Code (Do Not Modify): Private [1]       B Medical/Surgery History Past Medical History:  Diagnosis Date  . Cancer ([place])    lung ca DX 2019  . Collagen vascular disease (H[facility])     RA  . DM2 (diabetes mellitus, type 2) ([place])   . Dyspnea   . Hyperlipemia   . Hypertension   . Osteomyelitis ([place])   . Pneumonia   . RA (rheumatoid arthritis) ([place])   . Rheumatoid arthritis [facility])    Past Surgical History:  Procedure Laterality Date  . ENDOBRONCHIAL ULTRASOUND N/A 05/13/2018   Procedure: ENDOBRONCHIAL ULTRASOUND;  Surgeon: [name], MD;  Location: [facility] ORS;  Service: Pulmonary;  Laterality: N/A;  . PORTA CATH INSERTION N/A 05/27/2018   Procedure: PORTA CATH INSERTION;  Surgeon: Katha Cabal, MD;  Location: [place] CV LAB;  Service: Cardiovascular;  Laterality: N/A;  . TOE SURGERY       A IV Location/Drains/Wounds Patient Lines/Drains/Airways Status   Active Line/Drains/Airways    Name:   Placement date:   Placement time:   Site:   Days:   Peripheral IV 12/07/18 Left Antecubital   12/07/18    1447    Antecubital   less than 1          Intake/Output Last 24 hours  Intake/Output Summary (Last 24 hours) at 12/07/2018 2216 Last data filed at 12/07/2018 1824 Gross per 24 hour  Intake 300 ml  Output -  Net 300 ml    Labs/Imaging Results for orders placed or performed during the hospital encounter of 12/07/18 (from the past 48 hour(s))  Blood gas, venous ([facility], [facility], [facility])     Status: Abnormal   Collection Time: 12/07/18  1:40 PM  Result Value Ref Range   pH, Ven 7.35 7.250 - 7.430  pCO2, Ven 44 44.0 - 60.0 mmHg   pO2, Ven 57.0 (H) 32.0 - 45.0 mmHg   Bicarbonate 24.3 20.0 - 28.0 mmol/L   Acid-base deficit 1.5 0.0 - 2.0 mmol/L   O2 Saturation 87.7 %   Patient temperature 37.0    Collection site VEIN    Sample type VENOUS     Comment: Performed at [facility], [address] [place], [place] [address]  Lactic acid, plasma     Status: None   Collection Time: 12/07/18  1:54 PM  Result Value Ref Range   Lactic Acid, Venous 1.4 0.5 - 1.9 mmol/L    Comment: Performed at [facility], [address]  [place], [place] [address]  Comprehensive metabolic panel     Status: Abnormal   Collection Time: 12/07/18  1:54 PM  Result Value Ref Range   Sodium 134 (L) 135 - 145 mmol/L   Potassium 4.9 3.5 - 5.1 mmol/L   Chloride 101 98 - 111 mmol/L   CO2 21 (L) 22 - 32 mmol/L   Glucose, Bld 233 (H) 70 - 99 mg/dL   BUN 22 8 - 23 mg/dL   Creatinine, Ser 0.93 0.44 - 1.00 mg/dL   Calcium 8.3 (L) 8.9 - 10.3 mg/dL   Total Protein 8.0 6.5 - 8.1 g/dL   Albumin 2.3 (L) 3.5 - 5.0 g/dL   AST 33 15 - 41 U/L   ALT 15 0 - 44 U/L   Alkaline Phosphatase 86 38 - 126 U/L   Total Bilirubin 0.6 0.3 - 1.2 mg/dL   GFR calc non Af Amer >60 >60 mL/min   GFR calc Af Amer >60 >60 mL/min   Anion gap 12 5 - 15    Comment: Performed at [facility], [address] [place], [place] [address]  CBC WITH DIFFERENTIAL     Status: Abnormal   Collection Time: 12/07/18  1:54 PM  Result Value Ref Range   WBC 21.9 (H) 4.0 - 10.5 K/uL   RBC 3.12 (L) 3.87 - 5.11 MIL/uL   Hemoglobin 9.0 (L) 12.0 - 15.0 g/dL   HCT 30.4 (L) 36.0 - 46.0 %   MCV 97.4 80.0 - 100.0 fL   MCH 28.8 26.0 - 34.0 pg   MCHC 29.6 (L) 30.0 - 36.0 g/dL   RDW 16.5 (H) 11.5 - 15.5 %   Platelets 353 150 - 400 K/uL   nRBC 0.2 0.0 - 0.2 %   Neutrophils Relative % 87 %   Neutro Abs 18.9 (H) 1.7 - 7.7 K/uL   Lymphocytes Relative 3 %   Lymphs Abs 0.7 0.7 - 4.0 K/uL   Monocytes Relative 8 %   Monocytes Absolute 1.7 (H) 0.1 - 1.0 K/uL   Eosinophils Relative 0 %   Eosinophils Absolute 0.1 0.0 - 0.5 K/uL   Basophils Relative 0 %   Basophils Absolute 0.1 0.0 - 0.1 K/uL   Immature Granulocytes 2 %   Abs Immature Granulocytes 0.39 (H) 0.00 - 0.07 K/uL    Comment: Performed at [facility], [address] [place], [place] [address]  Troponin I - ONCE - STAT     Status: Abnormal   Collection Time: 12/07/18  1:54 PM  Result Value Ref Range   Troponin I 0.07 (HH) <0.03 ng/mL    Comment: CRITICAL RESULT CALLED TO, READ BACK BY AND VERIFIED WITH TERESA  CLAPP 12/07/18 @ [place] Performed at [facility], [address], [place], [place] [address]  Lactic acid, plasma     Status: None   Collection Time: 12/07/18  2:14 PM  Result Value Ref Range   Lactic Acid, Venous 1.4 0.5 - 1.9 mmol/L    Comment: Performed at [facility], [address] [place], [place] [address]  SARS Coronavirus 2 [facility] order, Performed in [place] hospital lab)     Status: None   Collection Time: 12/07/18  2:14 PM  Result Value Ref Range   SARS Coronavirus 2 NEGATIVE NEGATIVE    Comment: (NOTE) If result is NEGATIVE SARS-CoV-2 target nucleic acids are NOT DETECTED. The SARS-CoV-2 RNA is generally detectable in upper and lower  respiratory specimens during the acute phase of infection. The lowest  concentration of SARS-CoV-2 viral copies this assay can detect is 250  copies / mL. A negative result does not preclude SARS-CoV-2 infection  and should not be used as the sole basis for treatment or other  patient management decisions.  A negative result may occur with  improper specimen collection / handling, submission of specimen other  than nasopharyngeal swab, presence of viral mutation(s) within the  areas targeted by this assay, and inadequate number of viral copies  (<250 copies / mL). A negative result must be combined with clinical  observations, patient history, and epidemiological information. If result is POSITIVE SARS-CoV-2 target nucleic acids are DETECTED. The SARS-CoV-2 RNA is generally detectable in upper and lower  respiratory specimens dur ing the acute phase of infection.  Positive  results are indicative of active infection with SARS-CoV-2.  Clinical  correlation with patient history and other diagnostic information is  necessary to determine patient infection status.  Positive results do  not rule out bacterial infection or co-infection with other viruses. If result is PRESUMPTIVE POSTIVE SARS-CoV-2 nucleic acids  MAY BE PRESENT.   A presumptive positive result was obtained on the submitted specimen  and confirmed on repeat testing.  While 2019 novel coronavirus  (SARS-CoV-2) nucleic acids may be present in the submitted sample  additional confirmatory testing may be necessary for epidemiological  and / or clinical management purposes  to differentiate between  SARS-CoV-2 and other Sarbecovirus currently known to infect humans.  If clinically indicated additional testing with an alternate test  methodology [phone]) is advised. The SARS-CoV-2 RNA is generally  detectable in upper and lower respiratory sp ecimens during the acute  phase of infection. The expected result is Negative. Fact Sheet for Patients:  StrictlyIdeas.no Fact Sheet for Healthcare Providers: BankingDealers.co.za This test is not yet approved or cleared by the [place] FDA and has been authorized for detection and/or diagnosis of SARS-CoV-2 by FDA under an Emergency Use Authorization (EUA).  This EUA will remain in effect (meaning this test can be used) for the duration of the COVID-19 declaration under Section 564(b)(1) of the Act, 21 U.S.C. section 360bbb-3(b)(1), unless the authorization is terminated or revoked sooner. Performed at [facility], [place] [address] [place], [place] [address]    Dg Chest Port 1 View  Result Date: 12/07/2018 CLINICAL DATA:  Fever and respiratory distress EXAM: PORTABLE CHEST 1 VIEW COMPARISON:  12/03/2018 FINDINGS: Slightly decreased lung volumes noted with stable to slight increase in bilateral interstitial/airspace opacities. LEFT pleural effusion is again noted with LEFT hemithorax volume loss. A RIGHT IJ Port-A-Cath is again identified with tip overlying the mid SVC. No pneumothorax. IMPRESSION: Decreased lung volumes with stable to slight increase in diffuse bilateral interstitial/airspace opacities. No other significant change.  Electronically Signed  By: Margarette Canada M.D.   On: 12/07/2018 15:16    Pending Labs Unresulted Labs (From admission, onward)    Start     Ordered   12/07/18 1340  Blood Culture (routine x 2)  BLOOD CULTURE X 2,   STAT    Question:  Patient immune status  Answer:  Immunocompromised   12/07/18 1340   12/07/18 1340  Urine culture  ONCE - STAT,   STAT    Question:  Patient immune status  Answer:  Immunocompromised   12/07/18 1340   12/07/18 1340  Urinalysis, Complete w Microscopic  ONCE - STAT,   STAT     12/07/18 1340   Signed and Held  CBC  (enoxaparin (LOVENOX)    CrCl >/= 30 ml/min)  Once,   R    Comments:  Baseline for enoxaparin therapy IF NOT ALREADY DRAWN.  Notify MD if PLT < 100 K.    Signed and Held   Signed and Held  Creatinine, serum  (enoxaparin (LOVENOX)    CrCl >/= 30 ml/min)  Once,   R    Comments:  Baseline for enoxaparin therapy IF NOT ALREADY DRAWN.    Signed and Held   Signed and Held  Creatinine, serum  (enoxaparin (LOVENOX)    CrCl >/= 30 ml/min)  Weekly,   R    Comments:  while on enoxaparin therapy    Signed and Held   Signed and Held  Troponin I - Now Then Q6H  Now then every 6 hours,   R     Signed and Held   Signed and Held  CBC  Tomorrow morning,   R     Signed and Held   Signed and Held  Comprehensive metabolic panel  Tomorrow morning,   R     Signed and Held          Vitals/Pain Today's Vitals   12/07/18 2030 12/07/18 2045 12/07/18 2100 12/07/18 2115  BP: 114/77 112/89 (!) 144/76 (!) 105/95  Pulse: 96 95 96 97  Resp: (!) 23 (!) 23 (!) 23 (!) 22  Temp:      TempSrc:      SpO2: 100% 100% 100% 100%  Weight:      Height:      PainSc:        Isolation Precautions Droplet and Contact precautions  Medications Medications  aztreonam (AZACTAM) 1 g in sodium chloride 0.9 % 100 mL IVPB (0 g Intravenous Stopped 12/07/18 1920)  Ipratropium-Albuterol (COMBIVENT) respimat 2 puff (2 puffs Inhalation Given 12/07/18 1458)  vancomycin (VANCOCIN) IVPB  1000 mg/200 mL premix (0 mg Intravenous Stopped 12/07/18 1824)  levofloxacin (LEVAQUIN) IVPB 750 mg (0 mg Intravenous Stopped 12/07/18 1721)    Mobility walks Low fall risk   Focused Assessments Pulmonary Assessment Handoff:  Lung sounds: L Breath Sounds: Rhonchi R Breath Sounds: Rhonchi O2 Device: Nasal Cannula O2 Flow Rate (L/min): 6 L/min      R Recommendations: See Admitting Provider Note  Report given to:   Additional Notes:

## 2018-12-07 NOTE — ED Notes (Signed)
Pharmacy called to send Azactam

## 2018-12-08 ENCOUNTER — Inpatient Hospital Stay
Admit: 2018-12-08 | Discharge: 2018-12-08 | Disposition: A | Discharge status: Home / Self Care | Payer: Medicaid Other | Attending: Internal Medicine | Admitting: Internal Medicine

## 2018-12-08 ENCOUNTER — Inpatient Hospital Stay: Admit: 2018-12-08 | Payer: Medicaid Other

## 2018-12-08 ENCOUNTER — Inpatient Hospital Stay: Payer: Medicaid Other | Admitting: Hospice and Palliative Medicine

## 2018-12-08 ENCOUNTER — Other Ambulatory Visit: Payer: Self-pay | Admitting: Internal Medicine

## 2018-12-08 DIAGNOSIS — J189 Pneumonia, unspecified organism: Secondary | ICD-10-CM

## 2018-12-08 DIAGNOSIS — D649 Anemia, unspecified: Secondary | ICD-10-CM

## 2018-12-08 DIAGNOSIS — Z515 Encounter for palliative care: Secondary | ICD-10-CM

## 2018-12-08 DIAGNOSIS — F17211 Nicotine dependence, cigarettes, in remission: Secondary | ICD-10-CM

## 2018-12-08 DIAGNOSIS — M069 Rheumatoid arthritis, unspecified: Secondary | ICD-10-CM

## 2018-12-08 DIAGNOSIS — C3412 Malignant neoplasm of upper lobe, left bronchus or lung: Secondary | ICD-10-CM

## 2018-12-08 DIAGNOSIS — J9620 Acute and chronic respiratory failure, unspecified whether with hypoxia or hypercapnia: Secondary | ICD-10-CM

## 2018-12-08 LAB — COMPREHENSIVE METABOLIC PANEL
ALT: 16 U/L (ref 0–44)
AST: 30 U/L (ref 15–41)
Albumin: 2.1 g/dL — ABNORMAL LOW (ref 3.5–5.0)
Alkaline Phosphatase: 83 U/L (ref 38–126)
Anion gap: 7 (ref 5–15)
BUN: 34 mg/dL — ABNORMAL HIGH (ref 8–23)
CO2: 22 mmol/L (ref 22–32)
Calcium: 8.4 mg/dL — ABNORMAL LOW (ref 8.9–10.3)
Chloride: 103 mmol/L (ref 98–111)
Creatinine, Ser: 1.11 mg/dL — ABNORMAL HIGH (ref 0.44–1.00)
GFR calc Af Amer: >60 mL/min (ref >60)
GFR calc non Af Amer: 52 mL/min — ABNORMAL LOW (ref >60)
Glucose, Bld: 318 mg/dL — ABNORMAL HIGH (ref 70–99)
Potassium: 5.5 mmol/L — ABNORMAL HIGH (ref 3.5–5.1)
Sodium: 132 mmol/L — ABNORMAL LOW (ref 135–145)
Total Bilirubin: 0.5 mg/dL (ref 0.3–1.2)
Total Protein: 7.9 g/dL (ref 6.5–8.1)

## 2018-12-08 LAB — URINALYSIS, COMPLETE (UACMP) WITH MICROSCOPIC
Bilirubin Urine: NEGATIVE
Glucose, UA: 50 mg/dL — AB
Hgb urine dipstick: NEGATIVE
Ketones, ur: NEGATIVE mg/dL
Leukocytes,Ua: NEGATIVE
Nitrite: NEGATIVE
Protein, ur: 100 mg/dL — AB
Specific Gravity, Urine: 1.02 (ref 1.005–1.030)
pH: 5 (ref 5.0–8.0)

## 2018-12-08 LAB — CBC
HCT: 26.1 % — ABNORMAL LOW (ref 36.0–46.0)
Hemoglobin: 7.9 g/dL — ABNORMAL LOW (ref 12.0–15.0)
MCH: 29.5 pg (ref 26.0–34.0)
MCHC: 30.3 g/dL (ref 30.0–36.0)
MCV: 97.4 fL (ref 80.0–100.0)
Platelets: 382 10*3/uL (ref 150–400)
RBC: 2.68 MIL/uL — ABNORMAL LOW (ref 3.87–5.11)
RDW: 16.3 % — ABNORMAL HIGH (ref 11.5–15.5)
WBC: 17.8 10*3/uL — ABNORMAL HIGH (ref 4.0–10.5)
nRBC: 0.1 % (ref 0.0–0.2)

## 2018-12-08 LAB — GLUCOSE, CAPILLARY
Glucose-Capillary: 306 mg/dL — ABNORMAL HIGH (ref 70–99)
Glucose-Capillary: 332 mg/dL — ABNORMAL HIGH (ref 70–99)
Glucose-Capillary: 173 mg/dL — ABNORMAL HIGH (ref 70–99)
Glucose-Capillary: 210 mg/dL — ABNORMAL HIGH (ref 70–99)
Glucose-Capillary: 222 mg/dL — ABNORMAL HIGH (ref 70–99)

## 2018-12-08 LAB — TROPONIN I
Troponin I: 0.04 ng/mL (ref <0.03)
Troponin I: 0.05 ng/mL (ref <0.03)

## 2018-12-08 LAB — POTASSIUM: Potassium: 5.2 mmol/L — ABNORMAL HIGH (ref 3.5–5.1)

## 2018-12-08 MED ORDER — PREDNISONE 50 MG PO TABS
60.0000 mg | ORAL_TABLET | Freq: Every day | ORAL | Status: DC
  Administered 2018-12-08 – 2018-12-18 (×11): 60 mg via ORAL
  Filled 2018-12-08 (×11): qty 1

## 2018-12-08 MED ORDER — FAMOTIDINE IN NACL 20-0.9 MG/50ML-% IV SOLN
20.0000 mg | INTRAVENOUS | Status: DC
  Administered 2018-12-08 – 2018-12-12 (×5): 20 mg via INTRAVENOUS
  Filled 2018-12-08 (×5): qty 50

## 2018-12-08 MED ORDER — BUDESONIDE 0.5 MG/2ML IN SUSP
0.5000 mg | Freq: Two times a day (BID) | RESPIRATORY_TRACT | Status: DC
  Administered 2018-12-08 – 2018-12-18 (×20): 0.5 mg via RESPIRATORY_TRACT
  Filled 2018-12-08 (×20): qty 2

## 2018-12-08 MED ORDER — FLUCONAZOLE 100MG IVPB
100.0000 mg | Freq: Every morning | INTRAVENOUS | Status: DC
  Administered 2018-12-08 – 2018-12-13 (×6): 100 mg via INTRAVENOUS
  Filled 2018-12-08 (×6): qty 50

## 2018-12-08 MED ORDER — ENSURE MAX PROTEIN PO LIQD
11.0000 [oz_av] | Freq: Two times a day (BID) | ORAL | Status: DC
  Administered 2018-12-08 – 2018-12-17 (×11): 11 [oz_av] via ORAL
  Filled 2018-12-08: qty 330

## 2018-12-08 MED ORDER — CHLORHEXIDINE GLUCONATE 0.12 % MT SOLN
15.0000 mL | Freq: Two times a day (BID) | OROMUCOSAL | Status: DC
  Administered 2018-12-08 – 2018-12-18 (×20): 15 mL via OROMUCOSAL
  Filled 2018-12-08 (×20): qty 15

## 2018-12-08 MED ORDER — CALCIUM CARBONATE ANTACID 500 MG PO CHEW: 1.0000 | CHEWABLE_TABLET | Freq: Three times a day (TID) | ORAL | Status: DC | PRN

## 2018-12-08 MED ORDER — SODIUM CHLORIDE 0.9 % IV SOLN
2.0000 g | Freq: Three times a day (TID) | INTRAVENOUS | Status: DC
  Administered 2018-12-08 – 2018-12-13 (×15): 2 g via INTRAVENOUS
  Filled 2018-12-08 (×17): qty 2

## 2018-12-08 MED ORDER — IPRATROPIUM-ALBUTEROL 0.5-2.5 (3) MG/3ML IN SOLN
3.0000 mL | Freq: Four times a day (QID) | RESPIRATORY_TRACT | Status: DC
  Administered 2018-12-08 – 2018-12-15 (×29): 3 mL via RESPIRATORY_TRACT
  Filled 2018-12-08 (×29): qty 3

## 2018-12-08 MED ORDER — ORAL CARE MOUTH RINSE
15.0000 mL | Freq: Two times a day (BID) | OROMUCOSAL | Status: DC
  Administered 2018-12-08 – 2018-12-17 (×17): 15 mL via OROMUCOSAL

## 2018-12-08 NOTE — Progress Notes (Signed)
Initial Nutrition Assessment  DOCUMENTATION CODES:   Not applicable  INTERVENTION:  Recommend liberalizing diet to carbohydrate modified.  Provide Ensure Max Protein po BID, each supplement provides 150 kcal and 30 grams of protein.  NUTRITION DIAGNOSIS:   Increased nutrient needs related to catabolic illness(stage IV lung cancer) as evidenced by estimated needs.  GOAL:   Patient will meet greater than or equal to 90% of their needs  MONITOR:   PO intake, Supplement acceptance, Labs, Weight trends, Skin, I & O's  REASON FOR ASSESSMENT:   Malnutrition Screening Tool    ASSESSMENT:   63 year old female with PMHx of HLD, HTN, osteomyelitis, RA, DM type 2, stage IV lung cancer admitted with acute on chronic respiratory failure, clinical sepsis, thrush.   Patient well known to our service from multiple recent admissions. She has had a decreased appetite lately but when she is feeling well she can eat well at meals (tends to eat better towards end of admissions). She still tries to eat 3 meals per day. Tends to eat softer foods now such as soup and creamed potatoes. She is open to drinking oral nutrition supplements. Weight tends to fluctuate over time but lately has been stable. Current weight appears stated as it is exactly 180 lbs. Per chart she is interested in hospice services at home.  Medications reviewed and include: Novolog 0-9 units TID, Lantus 15 units daily, pantoprazole, cefepime, Diflucan.  Labs reviewed: CBG 774-128, Sodium 132, Potassium 5.5, BUN 34, Creatinine 1.11.  NUTRITION - FOCUSED PHYSICAL EXAM:  Unable to complete at this time.  Diet Order:   Diet Order            Diet heart healthy/carb modified Room service appropriate? Yes; Fluid consistency: Thin  Diet effective now             EDUCATION NEEDS:   Not appropriate for education at this time  Skin:  Skin Assessment: Skin Integrity Issues:(stg II ischial tuberosity)  Last BM:  12/04/2018 per  chart  Height:   Ht Readings from Last 1 Encounters:  12/07/18 5\' 6"  (1.676 m)   Weight:   Wt Readings from Last 1 Encounters:  12/07/18 81.6 kg   Ideal Body Weight:  59.1 kg  BMI:  Body mass index is 29.05 kg/m.  Estimated Nutritional Needs:   Kcal:  1800-2100  Protein:  95-105 grams  Fluid:  1.8-2.1 L/day  Willey Blade, MS, RD, LDN Office: [phone] Pager: [phone] After Hours/Weekend Pager: [phone]

## 2018-12-08 NOTE — Progress Notes (Signed)
Patient ID: Tamara Parrish, female   DOB: Apr 07, 1956, 63 y.o.   MRN: [id number]  [facility] PROGRESS NOTE  Tamara Parrish Tamara Parrish NLG:[id number] DOB: 01/13/1956 DOA: 12/07/2018 PCP: Glendon Axe, MD  HPI/Subjective: Patient came in with shortness of breath.  Her home health nurse measured her oxygen level.  She cannot sleep.  She normally wears 2 to 3 L of oxygen.  She has been having a dry cough and some wheeze.  She has been feeling weak.  Objective: Vitals:   12/07/18 2323 12/08/18 0343  BP: (!) 171/73 115/63  Pulse: (!) 108 84  Resp: (!) 24 19  Temp: 97.9 F (36.6 C) 97.7 F (36.5 C)  SpO2: 93% 95%    Intake/Output Summary (Last 24 hours) at 12/08/2018 0812 Last data filed at 12/08/2018 0350 Gross per 24 hour  Intake 417.65 ml  Output 200 ml  Net 217.65 ml   Filed Weights   12/07/18 1359  Weight: 81.6 kg    ROS: Review of Systems  Constitutional: Positive for malaise/fatigue. Negative for chills and fever.  Eyes: Negative for blurred vision.  Respiratory: Positive for cough, shortness of breath and wheezing.   Cardiovascular: Negative for chest pain.  Gastrointestinal: Negative for abdominal pain, constipation, diarrhea, nausea and vomiting.  Genitourinary: Negative for dysuria.  Musculoskeletal: Negative for joint pain.  Neurological: Negative for dizziness and headaches.   Exam: Physical Exam  Constitutional: She is oriented to person, place, and time.  HENT:  Nose: No mucosal edema.  Mouth/Throat: No oropharyngeal exudate or posterior oropharyngeal edema.  Thrush on the tongue and back of the throat  Eyes: Pupils are equal, round, and reactive to light. Conjunctivae, EOM and lids are normal.  Neck: No JVD present. Carotid bruit is not present. No edema present. No thyroid mass and no thyromegaly present.  Cardiovascular: S1 normal and S2 normal. Exam reveals no gallop.  No murmur heard. Pulses:      Dorsalis pedis pulses are 2+ on the right side and  2+ on the left side.  Respiratory: No respiratory distress. She has decreased breath sounds in the right lower field and the left lower field. She has no wheezes. She has rhonchi in the right lower field and the left lower field. She has no rales.  GI: Soft. Bowel sounds are normal. There is no abdominal tenderness.  Musculoskeletal:     Right ankle: She exhibits swelling.     Left ankle: She exhibits swelling.  Lymphadenopathy:    She has no cervical adenopathy.  Neurological: She is alert and oriented to person, place, and time. No cranial nerve deficit.  Skin: Skin is warm. Nails show no clubbing.  Redness on the buttock  Psychiatric: She has a normal mood and affect.      Data Reviewed: Basic Metabolic Panel: Recent Labs  Lab 12/02/18 0614 12/03/18 0442 12/04/18 0530 12/07/18 1354 12/08/18 0545  NA 135 136 135 134* 132*  K 3.7 4.2 4.2 4.9 5.5*  CL 107 105 103 101 103  CO2 25 26 25  21* 22  GLUCOSE 77 110* 135* 233* 318*  BUN 15 15 14 22  34*  CREATININE 0.74 0.76 0.67 0.93 1.11*  CALCIUM 7.6* 7.9* 8.3* 8.3* 8.4*   Liver Function Tests: Recent Labs  Lab 12/07/18 1354 12/08/18 0545  AST 33 30  ALT 15 16  ALKPHOS 86 83  BILITOT 0.6 0.5  PROT 8.0 7.9  ALBUMIN 2.3* 2.1*   CBC: Recent Labs  Lab 12/02/18 0614 12/03/18 0442 12/04/18  0530 12/07/18 1354 12/08/18 0545  WBC 15.6* 17.1* 16.6* 21.9* 17.8*  NEUTROABS  --   --  13.9* 18.9*  --   HGB 6.9* 8.1* 8.5* 9.0* 7.9*  HCT 22.9* 26.1* 27.7* 30.4* 26.1*  MCV 98.3 94.6 96.2 97.4 97.4  PLT 331 339 353 353 382   Cardiac Enzymes: Recent Labs  Lab 12/07/18 1354 12/08/18 0020 12/08/18 0545  TROPONINI 0.07* 0.05* 0.04*   BNP (last 3 results) Recent Labs    11/20/18 1948 11/29/18 1559  BNP 65.0 142.0*    ProBNP (last 3 results) No results for input(s): PROBNP in the last 8760 hours.  CBG: Recent Labs  Lab 12/03/18 2009 12/04/18 0747 12/04/18 1201 12/07/18 2330 12/08/18 0756  GLUCAP 142* 142* 148*  219* 306*    Recent Results (from the past 240 hour(s))  Blood Culture (routine x 2)     Status: None   Collection Time: 11/29/18  3:59 PM  Result Value Ref Range Status   Specimen Description BLOOD RIGHT ANTECUBITAL  Final   Special Requests   Final    BOTTLES DRAWN AEROBIC AND ANAEROBIC Blood Culture adequate volume   Culture   Final    NO GROWTH 5 DAYS Performed at [facility], [address] [place], [place] [address]    Report Status 12/04/2018 FINAL  Final  Blood Culture (routine x 2)     Status: None   Collection Time: 11/29/18  3:59 PM  Result Value Ref Range Status   Specimen Description BLOOD BLOOD RIGHT FOREARM  Final   Special Requests   Final    BOTTLES DRAWN AEROBIC AND ANAEROBIC Blood Culture adequate volume   Culture   Final    NO GROWTH 5 DAYS Performed at [facility], [address] [place], [place] [address]    Report Status 12/04/2018 FINAL  Final  MRSA PCR Screening     Status: None   Collection Time: 11/29/18  6:36 PM  Result Value Ref Range Status   MRSA by PCR NEGATIVE NEGATIVE Final    Comment:        The GeneXpert MRSA Assay (FDA approved for NASAL specimens only), is one component of a comprehensive MRSA colonization surveillance program. It is not intended to diagnose MRSA infection nor to guide or monitor treatment for MRSA infections. Performed at [facility], [address] [place], [place] [address]   Novel Coronavirus, NAA (hospital order; send-out to ref lab)     Status: None   Collection Time: 11/29/18 10:32 PM  Result Value Ref Range Status   SARS-CoV-2, NAA NOT DETECTED NOT DETECTED Final    Comment: Negative (Not Detected) results do not exclude infection caused by SARS CoV 2 and should not be used as the sole basis for treatment or other patient management decisions. Optimum specimen types and timing for peak viral levels during infections caused  by SARS CoV 2 have not been  determined. Collection of multiple specimens (types and time points) from the same patient may be necessary to detect the virus. Improper specimen collection and handling, sequence variability underlying assay primers and or probes, or the presence of organisms in  quantities less than the limit of detection of the assay may lead to false negative results. Positive and negative predictive values of testing are highly dependent on prevalence. False negative results are more likely when prevalence of disease is high. (NOTE) The expected result is Negative (Not Detected). The SARS CoV 2 test is intended for  the presumptive qualitative  detection of nucleic acid from SARS CoV 2 in upper and lower  respir atory specimens. Testing methodology is real time RT PCR. Test results must be correlated with clinical presentation and  evaluated in the context of other laboratory and epidemiologic data.  Test performance can be affected because the epidemiology and  clinical spectrum of infection caused by SARS CoV 2 is not fully  known. For example, the optimum types of specimens to collect and  when during the course of infection these specimens are most likely  to contain detectable viral RNA may not be known. This test has not been Food and Drug Administration (FDA) cleared or  approved and has been authorized by FDA under an Emergency Use  Authorization (EUA). The test is only authorized for the duration of  the declaration that circumstances exist justifying the authorization  of emergency use of in vitro diagnostic tests for detection and or  diagnosis of SARS CoV 2 under Section 564(b)(1) of the Act, 21 U.S.C.  section [phone] 3(b)(1), unless the authorization is terminated or   revoked sooner. [facility] is certified under the  Clinical Laboratory Improvement Amendments of 1988 (CLIA), 42 U.S.C.  section [phone], to perform high complexity tests. Performed at [facility] [id number], [place], [place] [address] Laboratory Director: Loleta Books, MD Fact Sheet for Healthcare Providers  BankingDealers.co.za Fact Sheet for Patients  StrictlyIdeas.no Performed at [facility], [place] [address], [place], [place] [address]    Coronavirus Source NASOPHARYNGEAL  Final    Comment: Performed at [facility], [address] [place], [place] [address]  Blood Culture (routine x 2)     Status: None (Preliminary result)   Collection Time: 12/07/18  2:14 PM  Result Value Ref Range Status   Specimen Description BLOOD LEFT ANTECUBITAL  Final   Special Requests   Final    BOTTLES DRAWN AEROBIC AND ANAEROBIC Blood Culture adequate volume   Culture   Final    NO GROWTH < 24 HOURS Performed at [facility], [address], [place], [place] [address]    Report Status PENDING  Incomplete  Blood Culture (routine x 2)     Status: None (Preliminary result)   Collection Time: 12/07/18  2:14 PM  Result Value Ref Range Status   Specimen Description BLOOD RIGHT ANTECUBITAL  Final   Special Requests   Final    BOTTLES DRAWN AEROBIC AND ANAEROBIC Blood Culture adequate volume   Culture   Final    NO GROWTH < 24 HOURS Performed at [facility], [address], [place], [place] [address]    Report Status PENDING  Incomplete  SARS Coronavirus 2 [facility] order, Performed in [place] hospital lab)     Status: None   Collection Time: 12/07/18  2:14 PM  Result Value Ref Range Status   SARS Coronavirus 2 NEGATIVE NEGATIVE Final    Comment: (NOTE) If result is NEGATIVE SARS-CoV-2 target nucleic acids are NOT DETECTED. The SARS-CoV-2 RNA is generally detectable in upper and lower  respiratory specimens during the acute phase of infection. The lowest  concentration of SARS-CoV-2 viral copies this assay can detect is 250  copies / mL. A negative  result does not preclude SARS-CoV-2 infection  and should not be used as the sole basis for treatment or other  patient management decisions.  A negative result may occur with  improper  specimen collection / handling, submission of specimen other  than nasopharyngeal swab, presence of viral mutation(s) within the  areas targeted by this assay, and inadequate number of viral copies  (<250 copies / mL). A negative result must be combined with clinical  observations, patient history, and epidemiological information. If result is POSITIVE SARS-CoV-2 target nucleic acids are DETECTED. The SARS-CoV-2 RNA is generally detectable in upper and lower  respiratory specimens dur ing the acute phase of infection.  Positive  results are indicative of active infection with SARS-CoV-2.  Clinical  correlation with patient history and other diagnostic information is  necessary to determine patient infection status.  Positive results do  not rule out bacterial infection or co-infection with other viruses. If result is PRESUMPTIVE POSTIVE SARS-CoV-2 nucleic acids MAY BE PRESENT.   A presumptive positive result was obtained on the submitted specimen  and confirmed on repeat testing.  While 2019 novel coronavirus  (SARS-CoV-2) nucleic acids may be present in the submitted sample  additional confirmatory testing may be necessary for epidemiological  and / or clinical management purposes  to differentiate between  SARS-CoV-2 and other Sarbecovirus currently known to infect humans.  If clinically indicated additional testing with an alternate test  methodology [phone]) is advised. The SARS-CoV-2 RNA is generally  detectable in upper and lower respiratory sp ecimens during the acute  phase of infection. The expected result is Negative. Fact Sheet for Patients:  StrictlyIdeas.no Fact Sheet for Healthcare Providers: BankingDealers.co.za This test is not yet  approved or cleared by the [place] FDA and has been authorized for detection and/or diagnosis of SARS-CoV-2 by FDA under an Emergency Use Authorization (EUA).  This EUA will remain in effect (meaning this test can be used) for the duration of the COVID-19 declaration under Section 564(b)(1) of the Act, 21 U.S.C. section 360bbb-3(b)(1), unless the authorization is terminated or revoked sooner. Performed at [facility], [place] [address], [place], [place] [address]      Studies: Dg Chest Port 1 View  Result Date: 12/07/2018 CLINICAL DATA:  Fever and respiratory distress EXAM: PORTABLE CHEST 1 VIEW COMPARISON:  12/03/2018 FINDINGS: Slightly decreased lung volumes noted with stable to slight increase in bilateral interstitial/airspace opacities. LEFT pleural effusion is again noted with LEFT hemithorax volume loss. A RIGHT IJ Port-A-Cath is again identified with tip overlying the mid SVC. No pneumothorax. IMPRESSION: Decreased lung volumes with stable to slight increase in diffuse bilateral interstitial/airspace opacities. No other significant change. Electronically Signed   By: Margarette Canada M.D.   On: 12/07/2018 15:16    Scheduled Meds: . amLODipine  5 mg Oral Daily  . aspirin  81 mg Oral Daily  . atorvastatin  40 mg Oral Daily  . chlorhexidine  15 mL Mouth Rinse BID  . enoxaparin (LOVENOX) injection  40 mg Subcutaneous Q24H  . hydroxychloroquine  200 mg Oral BID  . insulin aspart  0-9 Units Subcutaneous TID AC & HS  . insulin glargine  15 Units Subcutaneous q morning - 10a  . mouth rinse  15 mL Mouth Rinse q12n4p  . metoprolol tartrate  25 mg Oral BID  . oxyCODONE  20 mg Oral Q12H  . pantoprazole  40 mg Oral Daily   Continuous Infusions: . sodium chloride 75 mL/hr at 12/08/18 0303  . aztreonam 1 g (12/08/18 0303)  . fluconazole (DIFLUCAN) IV      Assessment/Plan:  1. Acute on chronic hypoxic respiratory failure.  Patient on high flow nasal cannula.  Patient  will need to  come off of high flow nasal cannula back to her normal chronic oxygen. 2. Clinical sepsis.  Hard to tell if this is healthcare associated pneumonia or not.  Patient given IV aztreonam.  Follow-up cultures. 3. thrush.  IV Diflucan. 4. Stage IV cancer of the lung.  Any treatments at this point would be palliative in nature.  Oncology consultation.  Patient is a full code. 5. Rheumatoid arthritis on Plaquenil 6. Type 2 diabetes on glargine insulin and sliding scale 7. Hyperkalemia.  Recheck potassium a little bit later 8. Hyperlipidemia unspecified on Lipitor 9. GERD on Protonix  Code Status:     Code Status Orders  (From admission, onward)         Start     Ordered   12/07/18 2346  Full code  Continuous     12/07/18 2345        Code Status History    Date Active Date Inactive Code Status Order ID Comments User Context   11/29/2018 2250 12/04/2018 1628 Full Code [id number]  Lance Coon, MD Inpatient   11/20/2018 2045 11/22/2018 1806 Full Code [id number]  Loletha Grayer, MD ED   10/26/2018 2037 10/28/2018 2050 Partial Code [id number]  Hillary Bow, MD Inpatient   10/26/2018 1728 10/26/2018 2037 DNR [id number]  Hillary Bow, MD ED   02/21/2018 2004 02/24/2018 1542 Full Code [id number]  Gladstone Lighter, MD ED   02/07/2018 2202 02/11/2018 1738 Full Code [id number]  Dustin Flock, MD Inpatient     Family Communication: Spoke with daughter on phone Disposition Plan: To be determined  Antibiotics:  Aztreonam  Diflucan  Time spent: 35 minutes including ACP time  [employer]

## 2018-12-08 NOTE — Progress Notes (Signed)
x

## 2018-12-08 NOTE — Progress Notes (Signed)
*  PRELIMINARY RESULTS* Echocardiogram 2D Echocardiogram has been performed.  Tamara Parrish 12/08/2018, 11:09 AM

## 2018-12-08 NOTE — Progress Notes (Addendum)
New referral for [facility] to follow at home received from [place] following a Palliaitve Medicine consult. Further conversations were held between patient's daughter Audelia Acton and attending physician Dr. Leslye Peer as well as oncologist Dr. Rogue Bussing. Writer received a call from Palliative NP [name] Borders advising that at this time there appears to be a disagreement between patient's daughters regarding their mother's plan of care.  Referral is on hold. CSW Annamaria Boots made aware. Will await patient/family decision. Thank you. Flo Shanks BSN, RN, [facility] [phone]

## 2018-12-08 NOTE — Progress Notes (Signed)
Patient ID: Tamara Parrish, female   DOB: 1956/04/16, 63 y.o.   MRN: [id number]  ACP note  Patient present at the bedside and spoke to the daughter on the phone.  Diagnosis: Acute hypoxic respiratory failure, clinical sepsis, thrush, stage IV cancer of the lung, rheumatoid arthritis, type 2 diabetes, hyperkalemia, hyperlipidemia, GERD, stage I decubitus ulcer present on admission  CODE STATUS discussed and patient wishes to be a full code.  With multiple hospital readmissions and stage IV cancer of the lung the patient's overall prognosis is poor.  Patient wants to be a full code.  The patient's oxygen requirements have increased and the patient is now on high flow nasal cannula.  The patient had 2 COVID-19 tests which were negative.  This is the third hospitalization in a short period of time.  Patient unable to get chemotherapy during these hospitalizations.  Any further chemotherapy will be palliative in nature and not curative.  This was explained to the patient and daughter.  Await oncology consultation.  Time spent on ACP discussion 17 minutes Dr Loletha Grayer

## 2018-12-08 NOTE — Consult Note (Signed)
[facility] NOTE  Patient Care Team: Glendon Axe, MD as PCP - General (Internal Medicine) Telford Nab, RN as Registered Nurse  CHIEF COMPLAINTS/PURPOSE OF CONSULTATION:  Lung cancer  HISTORY OF PRESENTING ILLNESS:  Tamara Parrish 63 y.o.  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  Patient has not had chemotherapy since February 2020 given the multiple admission to hospital for worsening respiratory function.   Patient is currently admitted hospital for worsening shortness of breath cough/progressive clinical decline.  CT scan done in April shows stable left upper lobe cavitary lesion/and worsening infiltrative changes in the left lower lobe/question lymphangitic spread.  Patient is a poor appetite.  Positive weight loss.  No hemoptysis.  Worsening joint pains.  Review of Systems  Constitutional: Positive for malaise/fatigue and weight loss. Negative for chills, diaphoresis and fever.  HENT: Negative for nosebleeds and sore throat.   Eyes: Negative for double vision.  Respiratory: Positive for cough, sputum production and shortness of breath. Negative for hemoptysis and wheezing.   Cardiovascular: Negative for chest pain, palpitations, orthopnea and leg swelling.  Gastrointestinal: Negative for abdominal pain, blood in stool, constipation, diarrhea, heartburn, melena, nausea and vomiting.  Genitourinary: Negative for dysuria, frequency and urgency.  Musculoskeletal: Positive for back pain and joint pain.  Skin: Negative.  Negative for itching and rash.  Neurological: Negative for dizziness, tingling, focal weakness, weakness and headaches.  Endo/Heme/Allergies: Does not bruise/bleed easily.  Psychiatric/Behavioral: Negative for depression. The patient is not nervous/anxious and does not have insomnia.      MEDICAL HISTORY:  Past Medical History:  Diagnosis Date  . Cancer ([place])    lung ca DX  2019  . Collagen vascular disease (HCC)    RA  . DM2 (diabetes mellitus, type 2) ([place])   . Dyspnea   . Hyperlipemia   . Hypertension   . Osteomyelitis ([place])   . Pneumonia   . RA (rheumatoid arthritis) ([place])   . Rheumatoid arthritis ([place])     SURGICAL HISTORY: Past Surgical History:  Procedure Laterality Date  . ENDOBRONCHIAL ULTRASOUND N/A 05/13/2018   Procedure: ENDOBRONCHIAL ULTRASOUND;  Surgeon: [name], MD;  Location: [facility];  Service: Pulmonary;  Laterality: N/A;  . PORTA CATH INSERTION N/A 05/27/2018   Procedure: PORTA CATH INSERTION;  Surgeon: Katha Cabal, MD;  Location: [facility];  Service: Cardiovascular;  Laterality: N/A;  . TOE SURGERY      SOCIAL HISTORY: Social History   Socioeconomic History  . Marital status: Single    Spouse name: Not on file  . Number of children: 2  . Years of education: Not on file  . Highest education level: Not on file  Occupational History  . Not on file  Social Needs  . Financial resource strain: Not very hard  . Food insecurity:    Worry: Never true    Inability: Never true  . Transportation needs:    Medical: No    Non-medical: No  Tobacco Use  . Smoking status: Former Smoker    Packs/day: 1.00    Years: 40.00    Pack years: 40.00    Types: Cigarettes    Last attempt to quit: 11/22/2017    Years since quitting: 1.0  . Smokeless tobacco: Never Used  . Tobacco comment: quit 2 weeks ago  Substance and Sexual Activity  . Alcohol use: Never    Frequency: Never  . Drug use: Never  . Sexual activity: Not  Currently  Lifestyle  . Physical activity:    Days per week: 0 days    Minutes per session: Not on file  . Stress: To some extent  Relationships  . Social connections:    Talks on phone: Three times a week    Gets together: More than three times a week    Attends religious service: Not on file    Active member of club or organization: No    Attends meetings of clubs or organizations:  Never    Relationship status: Never married  . Intimate partner violence:    Fear of current or ex partner: No    Emotionally abused: No    Physically abused: No    Forced sexual activity: No  Other Topics Concern  . Not on file  Social History Narrative   Living at home with daughter.  Ambulates with a walker at baseline.    FAMILY HISTORY: Family History  Problem Relation Age of Onset  . Diabetes Mother   . Lung cancer Father     ALLERGIES:  is allergic to ibuprofen and penicillins.  MEDICATIONS:  Current Facility-Administered Medications  Medication Dose Route Frequency Provider Last Rate Last Dose  . acetaminophen (TYLENOL) tablet 650 mg  650 mg Oral Q6H PRN Lance Coon, MD   650 mg at 12/08/18 0133   Or  . acetaminophen (TYLENOL) suppository 650 mg  650 mg Rectal Q6H PRN Lance Coon, MD      . ALPRAZolam Duanne Moron) tablet 0.5 mg  0.5 mg Oral TID PRN Lance Coon, MD   0.5 mg at 12/08/18 0134  . amLODipine (NORVASC) tablet 5 mg  5 mg Oral Daily Lance Coon, MD   5 mg at 12/08/18 1016  . aspirin chewable tablet 81 mg  81 mg Oral Daily Lance Coon, MD   81 mg at 12/08/18 1015  . atorvastatin (LIPITOR) tablet 40 mg  40 mg Oral Daily Lance Coon, MD   40 mg at 12/08/18 1016  . budesonide (PULMICORT) nebulizer solution 0.5 mg  0.5 mg Nebulization BID Loletha Grayer, MD      . calcium carbonate (TUMS - dosed in mg elemental calcium) chewable tablet 200 mg of elemental calcium  1 tablet Oral TID PRN Loletha Grayer, MD      . ceFEPIme (MAXIPIME) 2 g in sodium chloride 0.9 % 100 mL IVPB  2 g Intravenous Q8H Wieting, Richard, MD 200 mL/hr at 12/08/18 1441 2 g at 12/08/18 1441  . chlorhexidine (PERIDEX) 0.12 % solution 15 mL  15 mL Mouth Rinse BID Lance Coon, MD   15 mL at 12/08/18 1016  . enoxaparin (LOVENOX) injection 40 mg  40 mg Subcutaneous Q24H Lance Coon, MD      . famotidine (PEPCID) IVPB 20 mg premix  20 mg Intravenous Q24H Wieting, Richard, MD      .  fluconazole (DIFLUCAN) IVPB 100 mg  100 mg Intravenous q morning - 10a Loletha Grayer, MD 50 mL/hr at 12/08/18 1107 100 mg at 12/08/18 1107  . hydroxychloroquine (PLAQUENIL) tablet 200 mg  200 mg Oral BID Lance Coon, MD   200 mg at 12/08/18 1016  . insulin aspart (novoLOG) injection 0-9 Units  0-9 Units Subcutaneous TID AC & HS Lance Coon, MD   7 Units at 12/08/18 1339  . insulin glargine (LANTUS) injection 15 Units  15 Units Subcutaneous q morning - 10a Lance Coon, MD   15 Units at 12/08/18 1016  . ipratropium-albuterol (DUONEB) 0.5-2.5 (3) MG/3ML nebulizer solution  3 mL  3 mL Inhalation Q6H Loletha Grayer, MD      . MEDLINE mouth rinse  15 mL Mouth Rinse q12n4p Lance Coon, MD   15 mL at 12/08/18 1339  . metoprolol tartrate (LOPRESSOR) tablet 25 mg  25 mg Oral BID Lance Coon, MD   25 mg at 12/08/18 1016  . ondansetron (ZOFRAN) tablet 4 mg  4 mg Oral Q6H PRN Lance Coon, MD       Or  . ondansetron [facility]) injection 4 mg  4 mg Intravenous Q6H PRN Lance Coon, MD      . oxyCODONE (Oxy IR/ROXICODONE) immediate release tablet 10 mg  10 mg Oral Q6H PRN Lance Coon, MD      . oxyCODONE (OXYCONTIN) 12 hr tablet 20 mg  20 mg Oral Laurence Spates, MD   20 mg at 12/08/18 1015  . pantoprazole (PROTONIX) EC tablet 40 mg  40 mg Oral Daily Lance Coon, MD   40 mg at 12/08/18 1016  . protein supplement (ENSURE MAX) liquid  11 oz Oral BID BM Loletha Grayer, MD   11 oz at 12/08/18 1430      .  PHYSICAL EXAMINATION:  Vitals:   12/08/18 0343 12/08/18 1518  BP: 115/63 (!) 107/55  Pulse: 84 84  Resp: 19   Temp: 97.7 F (36.5 C) 98 F (36.7 C)  SpO2: 95% 95%   Filed Weights   12/07/18 1359  Weight: 180 lb (81.6 kg)    Physical Exam  Constitutional: She is oriented to person, place, and time and well-developed, well-nourished, and in no distress.  4-5 L of oxygen.  Alone.  HENT:  Head: Normocephalic and atraumatic.  Mouth/Throat: Oropharynx is clear and moist. No  oropharyngeal exudate.  Eyes: Pupils are equal, round, and reactive to light.  Neck: Normal range of motion. Neck supple.  Cardiovascular: Normal rate and regular rhythm.  Pulmonary/Chest: No respiratory distress. She has no wheezes.  Decreased air entry bilaterally.  Abdominal: Soft. Bowel sounds are normal. She exhibits no distension and no mass. There is no abdominal tenderness. There is no rebound and no guarding.  Musculoskeletal: Normal range of motion.        General: Edema present. No tenderness.  Neurological: She is alert and oriented to person, place, and time.  Skin: Skin is warm.  Psychiatric: Affect normal.     LABORATORY DATA:  I have reviewed the data as listed Lab Results  Component Value Date   WBC 17.8 (H) 12/08/2018   HGB 7.9 (L) 12/08/2018   HCT 26.1 (L) 12/08/2018   MCV 97.4 12/08/2018   PLT 382 12/08/2018   Recent Labs    11/29/18 1559  12/04/18 0530 12/07/18 1354 12/08/18 0545 12/08/18 1359  NA 136   < > 135 134* 132*  --   K 5.2*   < > 4.2 4.9 5.5* 5.2*  CL 105   < > 103 101 103  --   CO2 25   < > 25 21* 22  --   GLUCOSE 108*   < > 135* 233* 318*  --   BUN 18   < > 14 22 34*  --   CREATININE 0.81   < > 0.67 0.93 1.11*  --   CALCIUM 9.5   < > 8.3* 8.3* 8.4*  --   GFRNONAA >60   < > >60 >60 52*  --   GFRAA >60   < > >60 >60 >60  --   PROT 8.1  --   --  8.0 7.9  --   ALBUMIN 2.2*  --   --  2.3* 2.1*  --   AST 19  --   --  33 30  --   ALT 13  --   --  15 16  --   ALKPHOS 74  --   --  86 83  --   BILITOT 0.3  --   --  0.6 0.5  --    < > = values in this interval not displayed.    RADIOGRAPHIC STUDIES: I have personally reviewed the radiological images as listed and agreed with the findings in the report. Dg Chest 1 View  Result Date: 12/03/2018 CLINICAL DATA:  Fever. History of lung cancer in 2019. History of pneumonia, diabetes, former smoker. EXAM: CHEST  1 VIEW COMPARISON:  Chest x-rays dated 11/29/2018 and 10/26/2018. Chest CT dated  11/29/2018. FINDINGS: Heart size and mediastinal contours appear stable. RIGHT chest wall Port-A-Cath appears stable in position with tip at the level of the mid SVC. Persistent near complete opacification of the LEFT hemithorax, with small residual aeration at the LEFT lung apex. Increased interstitial markings within the RIGHT lung suggesting developing edema. No pneumothorax seen. Osseous structures about the chest are unremarkable. IMPRESSION: Stable chest x-ray. Persistent near complete opacification of the LEFT hemithorax, compatible with the combination of cavitary LEFT upper lobe pulmonary lesion (known lung cancer) and the progressive interstitial and ill-defined airspace disease in the LEFT lower lobe better demonstrated on chest CT of 11/29/2018 with differential of metastatic progression versus superimposed pneumonia. Electronically Signed   By: Franki Cabot M.D.   On: 12/03/2018 13:55   Dg Chest 2 View  Result Date: 11/20/2018 CLINICAL DATA:  Shortness of breath.  Left lower extremity edema. EXAM: CHEST - 2 VIEW COMPARISON:  November 07, 2018 chest x-ray and chest CT FINDINGS: A right Port-A-Cath is stable. Infiltrate in the left lung has worsened in the interval. No other interval changes. IMPRESSION: Worsening infiltrate in the left lung suggesting the possibility of worsening pneumonia. Electronically Signed   By: Dorise Bullion III M.D   On: 11/20/2018 17:32   Ct Chest W Contrast  Result Date: 11/29/2018 CLINICAL DATA:  Shortness of breath and increasing oxygen requirements. EXAM: CT CHEST WITH CONTRAST TECHNIQUE: Multidetector CT imaging of the chest was performed during intravenous contrast administration. CONTRAST:  37mL OMNIPAQUE IOHEXOL 300 MG/ML  SOLN COMPARISON:  11/07/2018 FINDINGS: Cardiovascular: The heart size is normal. No substantial pericardial effusion. Coronary artery calcification is evident. Atherosclerotic calcification is noted in the wall of the thoracic aorta. Right  Port-A-Cath tip is positioned in the mid SVC. Main pulmonary arteries are enlarged. Mediastinum/Nodes: 8 mm short axis subcarinal lymph node evident. Precarinal lymph node measures up to 11 mm short axis. Multiple small nodes are seen in the prevascular space. There is no axillary lymphadenopathy. Lungs/Pleura: The central tracheobronchial airways are patent. Centrilobular and paraseptal emphysema evident. volume loss left hemithorax is similar to prior. Continued progression of interstitial and airspace disease in the left upper lobe surrounding the cavitary mass in this patient with known lung cancer.Interval progression of interstitial and irregular, ill-defined airspace disease in the left base with peripheral predominance. Areas of subpleural reticulation in the right lung are similar to prior. Upper Abdomen: Unremarkable Musculoskeletal: No worrisome lytic or sclerotic osseous abnormality. Old left rib fracture evident. IMPRESSION: 1. Volume loss left hemithorax with similar appearance of cavitary left upper lobe pulmonary lesion in this patient with known lung cancer. Since 11/07/2018,  there is been progression of interstitial and ill-defined airspace disease in the left lower lobe which may represent metastatic progression or infectious etiology. 2. Borderline to mild mediastinal lymphadenopathy. Metastatic disease a concern. 3. Enlargement of the main pulmonary arteries compatible with pulmonary arterial hypertension. 4.  Aortic Atherosclerois (ICD10-170.0) 5.  Emphysema. ([id number]) Electronically Signed   By: Misty Stanley M.D.   On: 11/29/2018 18:55   Dg Chest Port 1 View  Result Date: 12/07/2018 CLINICAL DATA:  Fever and respiratory distress EXAM: PORTABLE CHEST 1 VIEW COMPARISON:  12/03/2018 FINDINGS: Slightly decreased lung volumes noted with stable to slight increase in bilateral interstitial/airspace opacities. LEFT pleural effusion is again noted with LEFT hemithorax volume loss. A RIGHT IJ  Port-A-Cath is again identified with tip overlying the mid SVC. No pneumothorax. IMPRESSION: Decreased lung volumes with stable to slight increase in diffuse bilateral interstitial/airspace opacities. No other significant change. Electronically Signed   By: Margarette Canada M.D.   On: 12/07/2018 15:16   Dg Chest Port 1 View  Result Date: 11/29/2018 CLINICAL DATA:  Shortness of breath.  Exposure to COVID-19. EXAM: PORTABLE CHEST 1 VIEW COMPARISON:  Chest x-rays dated 11/20/2018 and 11/07/2018 and chest CTs dated 11/07/2018 and 10/04/2018 FINDINGS: There is a persistent extensive infiltrate in the left lung superimposed on chronic changes including a cavitary lesion in the left midzone laterally. Overall heart size is normal. Aortic atherosclerosis. Power port in good position. Right lung is clear. No acute bone abnormality. IMPRESSION: No change in the appearance of the chest since the prior study of 11/20/2018. Persistent extensive infiltrate on the left superimposed on chronic changes demonstrated on the prior CT scans. Aortic Atherosclerosis (ICD10-I70.0). Electronically Signed   By: Lorriane Shire M.D.   On: 11/29/2018 16:06    Cancer of upper lobe of left lung [facility]) #63 year old female patient with history of metastatic squamous cell lung cancer most recently on carboplatin Taxol Beryle Flock is currently admitted to hospital for worsening shortness of breath  #Metastatic squamous lung cancer-unfortunately unable to receive chemotherapy given multiple admissions to hospital [see below].  CT findings April 2020-concerning for left lower lobe lymphangitic spread.  Patient likely not able to receive further chemotherapy given the respiratory failure/see below  #Acute on chronic respiratory failure-likely secondary to progressive malignancy question infection [clinically less likely status post multiple rounds of antibiotics].  Do not suspect immunotherapy related pneumonitis.  However it is reasonable to add  steroids monitor closely for any clinical improvement.  Start prednisone 60 mg a day.  #Rheumatoid arthritis-worsened; question secondary to immunotherapy.  See above/steroids  #Worsening anemia-multifactorial.  7.9 hold blood transfusion for now.  #Poorly controlled blood sugars-need to watch closely on steroids.   #Goals of care discussed-would recommend DNR/DNI.  Recommend evaluation with palliative care.  Discussed with [name].   #Discussed with the patient's daughter over the phone Margo given the likely progression from malignancy-I do not think patient is a candidate for any further therapy at this time/or in near future.  Recommend hospice.  However, daughter feels that she is not ready to "give up".  Patient is currently limited code.  Also discussed with Dr. Tasia Catchings.  Thank you Dr.Weiting for allowing me to participate in the care of your pleasant patient. Please do not hesitate to contact me with questions or concerns in the interim.  Discussed with Dr. Bobbye Charleston.       All questions were answered. The patient knows to call the clinic with any problems, questions or concerns.  Cammie Sickle, MD 12/08/2018 3:35 PM

## 2018-12-08 NOTE — Consult Note (Signed)
[place]  [phone] [phone]   Name: Tamara Parrish Date: 12/08/2018 MRN: [id number]  DOB: 01/02/1956  Patient Care Team: Glendon Axe, MD as PCP - General (Internal Medicine) Telford Nab, RN as Registered Nurse    REASON FOR CONSULTATION: Palliative Care consult requested for this 63 y.o. 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 Patient was hospitalized 11/20/18 to 11/22/18 with hyperkalemia and AKI. She was admitted again 11/20/18 to 11/22/18 and 11/29/18 to 12/04/18 with PNA. She is now readmitted 12/08/18 with same. Patient has been seen in consultation with oncology who is concerned that respiratory failure reflects disease progression. Palliative care was consulted to help address goals.   SOCIAL HISTORY:     reports that she quit smoking about 12 months ago. Her smoking use included cigarettes. She has a 40.00 pack-year smoking history. She has never used smokeless tobacco. She reports that she does not drink alcohol or use drugs.   Patient is divorced.  She has 2 daughters, both of whom live locally.  Patient rotates her residence between her daughters.  Patient formally worked as a Programmer, applications.  ADVANCE DIRECTIVES:  Does not have  CODE STATUS: DNR  PAST MEDICAL HISTORY: Past Medical History:  Diagnosis Date   Cancer ([place])    lung ca DX 2019   Collagen vascular disease ([place])    RA   DM2 (diabetes mellitus, type 2) ([place])    Dyspnea    Hyperlipemia    Hypertension    Osteomyelitis (HCC)    Pneumonia    RA (rheumatoid arthritis) ([place])    Rheumatoid arthritis ([place])     PAST SURGICAL HISTORY:  Past Surgical History:  Procedure Laterality Date   ENDOBRONCHIAL ULTRASOUND N/A 05/13/2018   Procedure: ENDOBRONCHIAL ULTRASOUND;  Surgeon: [name], MD;  Location: [facility] ORS;  Service: Pulmonary;  Laterality: N/A;   PORTA CATH INSERTION N/A 05/27/2018   Procedure: PORTA CATH INSERTION;  Surgeon: Katha Cabal, MD;  Location: [facility];  Service: Cardiovascular;  Laterality: N/A;   TOE SURGERY      HEMATOLOGY/ONCOLOGY HISTORY:    Malignant neoplasm of lung ([place])   05/25/2018 Initial Diagnosis    Malignant neoplasm of lung ([place])    05/31/2018 - 10/05/2018 Chemotherapy    The patient had palonosetron (ALOXI) injection 0.25 mg, 0.25 mg, Intravenous,  Once, 6 of 6 cycles Administration: 0.25 mg (05/31/2018), 0.25 mg (06/21/2018), 0.25 mg (07/12/2018), 0.25 mg (08/03/2018), 0.25 mg (08/25/2018), 0.25 mg (09/15/2018) pegfilgrastim-cbqv (UDENYCA) injection 6 mg, 6 mg, Subcutaneous, Once, 2 of 2 cycles Administration: 6 mg (06/02/2018) CARBOplatin (PARAPLATIN) 690 mg in sodium chloride 0.9 % 250 mL chemo infusion, 690 mg (100 % of original dose 693.6 mg), Intravenous,  Once, 6 of 6 cycles Dose modification:   (original dose 693.6 mg, Cycle 1) Administration: 690 mg (05/31/2018), 450 mg (06/21/2018), 540 mg (07/12/2018), 540 mg (08/03/2018), 450 mg (08/25/2018), 500 mg (09/15/2018) PACLitaxel (TAXOL) 342 mg in sodium chloride 0.9 % 500 mL chemo infusion (> 80mg /m2), 175 mg/m2 = 342 mg (87.5 % of original dose 200 mg/m2), Intravenous,  Once, 6 of 6 cycles Dose modification: 175 mg/m2 (original dose 200 mg/m2, Cycle 1, Reason: Patient Age), 135 mg/m2 (original dose 200 mg/m2, Cycle 3, Reason: Other (see comments), Comment: neuropathy) Administration: 342 mg (05/31/2018), 342 mg (06/21/2018), 264 mg (07/12/2018),  264 mg (08/03/2018), 264 mg (08/25/2018), 264 mg (09/15/2018)  for chemotherapy treatment.     10/12/2018 -  Chemotherapy    The patient had palonosetron (ALOXI) injection 0.25 mg, 0.25 mg, Intravenous,  Once, 1 of 6 cycles Administration: 0.25 mg (10/12/2018) CARBOplatin (PARAPLATIN) 550 mg in sodium chloride 0.9 % 250 mL chemo  infusion, 550 mg (100 % of original dose 545.5 mg), Intravenous,  Once, 1 of 6 cycles Dose modification:   (original dose 545.5 mg, Cycle 1) Administration: 550 mg (10/12/2018) PACLitaxel (TAXOL) 276 mg in sodium chloride 0.9 % 250 mL chemo infusion (> 80mg /m2), 135 mg/m2 = 276 mg (100 % of original dose 135 mg/m2), Intravenous,  Once, 1 of 6 cycles Dose modification: 135 mg/m2 (original dose 135 mg/m2, Cycle 1, Reason: Dose not tolerated) Administration: 276 mg (10/12/2018) pembrolizumab (KEYTRUDA) 200 mg in sodium chloride 0.9 % 50 mL chemo infusion, 200 mg, Intravenous, Once, 1 of 6 cycles Administration: 200 mg (10/12/2018) fosaprepitant (EMEND) 150 mg, dexamethasone (DECADRON) 12 mg in sodium chloride 0.9 % 145 mL IVPB, , Intravenous,  Once, 1 of 6 cycles Administration:  (10/12/2018)  for chemotherapy treatment.      ALLERGIES:  is allergic to ibuprofen and penicillins.  MEDICATIONS:  Current Facility-Administered Medications  Medication Dose Route Frequency Provider Last Rate Last Dose   acetaminophen (TYLENOL) tablet 650 mg  650 mg Oral Q6H PRN Lance Coon, MD   650 mg at 12/08/18 0133   Or   acetaminophen (TYLENOL) suppository 650 mg  650 mg Rectal Q6H PRN Lance Coon, MD       ALPRAZolam Duanne Moron) tablet 0.5 mg  0.5 mg Oral TID PRN Lance Coon, MD   0.5 mg at 12/08/18 0134   amLODipine (NORVASC) tablet 5 mg  5 mg Oral Daily Lance Coon, MD   5 mg at 12/08/18 1016   aspirin chewable tablet 81 mg  81 mg Oral Daily Lance Coon, MD   81 mg at 12/08/18 1015   atorvastatin (LIPITOR) tablet 40 mg  40 mg Oral Daily Lance Coon, MD   40 mg at 12/08/18 1016   aztreonam (AZACTAM) 1 g in sodium chloride 0.9 % 100 mL IVPB  1 g Intravenous Driscilla Moats, MD 200 mL/hr at 12/08/18 0303 1 g at 12/08/18 0303   chlorhexidine (PERIDEX) 0.12 % solution 15 mL  15 mL Mouth Rinse BID Lance Coon, MD   15 mL at 12/08/18 1016   enoxaparin (LOVENOX) injection 40 mg  40 mg Subcutaneous  Q24H Lance Coon, MD       fluconazole (DIFLUCAN) IVPB 100 mg  100 mg Intravenous q morning - 10a Loletha Grayer, MD 50 mL/hr at 12/08/18 1107 100 mg at 12/08/18 1107   hydroxychloroquine (PLAQUENIL) tablet 200 mg  200 mg Oral BID Lance Coon, MD   200 mg at 12/08/18 1016   insulin aspart (novoLOG) injection 0-9 Units  0-9 Units Subcutaneous TID AC & HS Lance Coon, MD   7 Units at 12/08/18 1016   insulin glargine (LANTUS) injection 15 Units  15 Units Subcutaneous q morning - 10a Lance Coon, MD   15 Units at 12/08/18 1016   ipratropium-albuterol (DUONEB) 0.5-2.5 (3) MG/3ML nebulizer solution 3 mL  3 mL Inhalation Q6H PRN Lance Coon, MD       MEDLINE mouth rinse  15 mL Mouth Rinse q12n4p Lance Coon, MD       metoprolol tartrate (LOPRESSOR) tablet 25 mg  25 mg Oral BID Lance Coon, MD  25 mg at 12/08/18 1016   ondansetron (ZOFRAN) tablet 4 mg  4 mg Oral Q6H PRN Lance Coon, MD       Or   ondansetron [facility]) injection 4 mg  4 mg Intravenous Q6H PRN Lance Coon, MD       oxyCODONE (Oxy IR/ROXICODONE) immediate release tablet 10 mg  10 mg Oral Q6H PRN Lance Coon, MD       oxyCODONE (OXYCONTIN) 12 hr tablet 20 mg  20 mg Oral Laurence Spates, MD   20 mg at 12/08/18 1015   pantoprazole (PROTONIX) EC tablet 40 mg  40 mg Oral Daily Lance Coon, MD   40 mg at 12/08/18 1016    VITAL SIGNS: BP 115/63 (BP Location: Right Arm)    Pulse 84    Temp 97.7 F (36.5 C) (Oral)    Resp 19    Ht 5\' 6"  (1.676 m)    Wt 180 lb (81.6 kg)    SpO2 95%    BMI 29.05 kg/m  Filed Weights   12/07/18 1359  Weight: 180 lb (81.6 kg)    Estimated body mass index is 29.05 kg/m as calculated from the following:   Height as of this encounter: 5\' 6"  (1.676 m).   Weight as of this encounter: 180 lb (81.6 kg).  LABS: CBC:    Component Value Date/Time   WBC 17.8 (H) 12/08/2018 0545   HGB 7.9 (L) 12/08/2018 0545   HGB 11.2 (L) 02/12/2014 0347   HCT 26.1 (L) 12/08/2018 0545   HCT  33.7 (L) 02/12/2014 0347   PLT 382 12/08/2018 0545   PLT 404 02/12/2014 0347   MCV 97.4 12/08/2018 0545   MCV 91 02/12/2014 0347   NEUTROABS 18.9 (H) 12/07/2018 1354   NEUTROABS 5.7 02/12/2014 0347   LYMPHSABS 0.7 12/07/2018 1354   LYMPHSABS 2.9 02/12/2014 0347   MONOABS 1.7 (H) 12/07/2018 1354   MONOABS 1.2 (H) 02/12/2014 0347   EOSABS 0.1 12/07/2018 1354   EOSABS 0.2 02/12/2014 0347   BASOSABS 0.1 12/07/2018 1354   BASOSABS 0.1 02/12/2014 0347   Comprehensive Metabolic Panel:    Component Value Date/Time   NA 132 (L) 12/08/2018 0545   NA 133 (L) 02/09/2014 0526   K 5.5 (H) 12/08/2018 0545   K 4.2 02/09/2014 0526   CL 103 12/08/2018 0545   CL 102 02/09/2014 0526   CO2 22 12/08/2018 0545   CO2 26 02/09/2014 0526   BUN 34 (H) 12/08/2018 0545   BUN 11 02/09/2014 0526   CREATININE 1.11 (H) 12/08/2018 0545   CREATININE 0.78 02/09/2014 0526   GLUCOSE 318 (H) 12/08/2018 0545   GLUCOSE 220 (H) 02/09/2014 0526   CALCIUM 8.4 (L) 12/08/2018 0545   CALCIUM 8.4 (L) 02/09/2014 0526   AST 30 12/08/2018 0545   AST 16 02/08/2014 1034   ALT 16 12/08/2018 0545   ALT 12 02/08/2014 1034   ALKPHOS 83 12/08/2018 0545   ALKPHOS 113 02/08/2014 1034   BILITOT 0.5 12/08/2018 0545   BILITOT 0.3 02/08/2014 1034   PROT 7.9 12/08/2018 0545   PROT 7.6 02/08/2014 1034   ALBUMIN 2.1 (L) 12/08/2018 0545   ALBUMIN 2.9 (L) 02/08/2014 1034    RADIOGRAPHIC STUDIES: Dg Chest 1 View  Result Date: 12/03/2018 CLINICAL DATA:  Fever. History of lung cancer in 2019. History of pneumonia, diabetes, former smoker. EXAM: CHEST  1 VIEW COMPARISON:  Chest x-rays dated 11/29/2018 and 10/26/2018. Chest CT dated 11/29/2018. FINDINGS: Heart size and mediastinal contours appear  stable. RIGHT chest wall Port-A-Cath appears stable in position with tip at the level of the mid SVC. Persistent near complete opacification of the LEFT hemithorax, with small residual aeration at the LEFT lung apex. Increased interstitial  markings within the RIGHT lung suggesting developing edema. No pneumothorax seen. Osseous structures about the chest are unremarkable. IMPRESSION: Stable chest x-ray. Persistent near complete opacification of the LEFT hemithorax, compatible with the combination of cavitary LEFT upper lobe pulmonary lesion (known lung cancer) and the progressive interstitial and ill-defined airspace disease in the LEFT lower lobe better demonstrated on chest CT of 11/29/2018 with differential of metastatic progression versus superimposed pneumonia. Electronically Signed   By: Franki Cabot M.D.   On: 12/03/2018 13:55   Dg Chest 2 View  Result Date: 11/20/2018 CLINICAL DATA:  Shortness of breath.  Left lower extremity edema. EXAM: CHEST - 2 VIEW COMPARISON:  November 07, 2018 chest x-ray and chest CT FINDINGS: A right Port-A-Cath is stable. Infiltrate in the left lung has worsened in the interval. No other interval changes. IMPRESSION: Worsening infiltrate in the left lung suggesting the possibility of worsening pneumonia. Electronically Signed   By: Dorise Bullion III M.D   On: 11/20/2018 17:32   Ct Chest W Contrast  Result Date: 11/29/2018 CLINICAL DATA:  Shortness of breath and increasing oxygen requirements. EXAM: CT CHEST WITH CONTRAST TECHNIQUE: Multidetector CT imaging of the chest was performed during intravenous contrast administration. CONTRAST:  11mL OMNIPAQUE IOHEXOL 300 MG/ML  SOLN COMPARISON:  11/07/2018 FINDINGS: Cardiovascular: The heart size is normal. No substantial pericardial effusion. Coronary artery calcification is evident. Atherosclerotic calcification is noted in the wall of the thoracic aorta. Right Port-A-Cath tip is positioned in the mid SVC. Main pulmonary arteries are enlarged. Mediastinum/Nodes: 8 mm short axis subcarinal lymph node evident. Precarinal lymph node measures up to 11 mm short axis. Multiple small nodes are seen in the prevascular space. There is no axillary lymphadenopathy.  Lungs/Pleura: The central tracheobronchial airways are patent. Centrilobular and paraseptal emphysema evident. volume loss left hemithorax is similar to prior. Continued progression of interstitial and airspace disease in the left upper lobe surrounding the cavitary mass in this patient with known lung cancer.Interval progression of interstitial and irregular, ill-defined airspace disease in the left base with peripheral predominance. Areas of subpleural reticulation in the right lung are similar to prior. Upper Abdomen: Unremarkable Musculoskeletal: No worrisome lytic or sclerotic osseous abnormality. Old left rib fracture evident. IMPRESSION: 1. Volume loss left hemithorax with similar appearance of cavitary left upper lobe pulmonary lesion in this patient with known lung cancer. Since 11/07/2018, there is been progression of interstitial and ill-defined airspace disease in the left lower lobe which may represent metastatic progression or infectious etiology. 2. Borderline to mild mediastinal lymphadenopathy. Metastatic disease a concern. 3. Enlargement of the main pulmonary arteries compatible with pulmonary arterial hypertension. 4.  Aortic Atherosclerois (ICD10-170.0) 5.  Emphysema. (URK27-C62.9) Electronically Signed   By: Misty Stanley M.D.   On: 11/29/2018 18:55   Dg Chest Port 1 View  Result Date: 12/07/2018 CLINICAL DATA:  Fever and respiratory distress EXAM: PORTABLE CHEST 1 VIEW COMPARISON:  12/03/2018 FINDINGS: Slightly decreased lung volumes noted with stable to slight increase in bilateral interstitial/airspace opacities. LEFT pleural effusion is again noted with LEFT hemithorax volume loss. A RIGHT IJ Port-A-Cath is again identified with tip overlying the mid SVC. No pneumothorax. IMPRESSION: Decreased lung volumes with stable to slight increase in diffuse bilateral interstitial/airspace opacities. No other significant change. Electronically Signed  By: Margarette Canada M.D.   On: 12/07/2018 15:16    Dg Chest Port 1 View  Result Date: 11/29/2018 CLINICAL DATA:  Shortness of breath.  Exposure to COVID-19. EXAM: PORTABLE CHEST 1 VIEW COMPARISON:  Chest x-rays dated 11/20/2018 and 11/07/2018 and chest CTs dated 11/07/2018 and 10/04/2018 FINDINGS: There is a persistent extensive infiltrate in the left lung superimposed on chronic changes including a cavitary lesion in the left midzone laterally. Overall heart size is normal. Aortic atherosclerosis. Power port in good position. Right lung is clear. No acute bone abnormality. IMPRESSION: No change in the appearance of the chest since the prior study of 11/20/2018. Persistent extensive infiltrate on the left superimposed on chronic changes demonstrated on the prior CT scans. Aortic Atherosclerosis (ICD10-I70.0). Electronically Signed   By: Lorriane Shire M.D.   On: 11/29/2018 16:06    PERFORMANCE STATUS (ECOG) : 3 - Symptomatic, >50% confined to bed  Review of Systems Unless otherwise noted, a complete review of systems is negative.  Physical Exam General: NAD, frail appearing, thin Pulmonary: unlabored, on 10L O2 via [place] Extremities: no edema Skin: no rashes Neurological: Weakness but otherwise nonfocal  IMPRESSION: Patient is familiar to me from a previous hospitalization and the clinic. Despite patient's O2 needs being worse over the past 24 hours, she says she feels better. She is now on high flow O2 at 10L/minute.   Patient's daughter, Theadora Rama, participated in my visit via phone.   Together, we reviewed patient's current medical problems. Both patient and daughter agreed that she has significantly declined over the past two months. At baseline, patient is now mostly bed bound and only able to transfer [facility] to the [facility]. Her oral intake has been consistently poor.   Patient asked if she was approaching end of life. We spoke candidly about her poor prognosis and her frailty and poor performance status limiting her ability for treatment in  the near future. We discussed the option of hospice involvement, either at home or a residential hospice facility. Both patient and daughter say they would agree with hospice at home. Daughter asked if patient could be reconsidered for treatment at some point in the future if she were to improve. However, both patient and daughter seem to recognize that patient may be nearing end of life. Both verbalized a desire to focus more on comfort at home and to try to prevent rehospitalization if possible.   For now, patient would like to continue current scope of care while in the hospital.   We discussed code status at length. Patient says she would not be interested in intubation or want her life prolonged on machines. She is also not interested in CPR or other resuscitative measures. Both patient and daughter agreed with DNR.   Case discussed with Dr. Rogue Bussing, Dr. Tasia Catchings, and Dr. Leslye Peer  PLAN: -Continue current scope of treatment -Care management referral for hospice at home -DNR   Time Total: 60 minutes  Visit consisted of counseling and education dealing with the complex and emotionally intense issues of symptom management and palliative care in the setting of serious and potentially life-threatening illness.Greater than 50%  of this time was spent counseling and coordinating care related to the above assessment and plan.  Signed by: Altha Harm, PhD, NP-C [phone] (Work Cell)

## 2018-12-08 NOTE — TOC Initial Note (Signed)
Transition of Care [facility]) - Initial/Assessment Note    Patient Details  Name: Tamara Parrish MRN: [id number] Date of Birth: 19-Jul-1956  [facility]) CM/SW Contact:    Annamaria Boots, [place] Phone Number: 12/08/2018, 2:56 PM  Clinical Narrative:  CSW consulted for home with hospice. CSW spoke with patient's daughter Baron Hamper [phone]. Theadora Rama states that patient has been living with her sister Audelia Acton for the past few months and she will come to live with her now. Theadora Rama states that she would like [facility] at home. Theadora Rama also states that patient will need a hospital bed and oxygen before discharge. Theadora Rama states that her address is [address] [place] [address]. CSW notified [facility] liaison of referral and DME needs. CSW will continue to follow for discharge planning.                   Expected Discharge Plan: Home w Hospice Care Barriers to Discharge: Continued Medical Work up   Patient Goals and CMS Choice Patient states their goals for this hospitalization and ongoing recovery are:: Daughter states that their goal is comfort care  CMS Medicare.gov Compare Post Acute Care list provided to:: Patient Represenative (must comment)(Daughter- Theadora Rama ) Choice offered to / list presented to : Adult Children  Expected Discharge Plan and Services Expected Discharge Plan: [place] In-house Referral: Hospice / [place] Acute Care Choice: Hospice Living arrangements for the past 2 months: [place]                 DME Arranged: Oxygen, Hospital bed DME Agency: [facility]: [facility]  Prior Living Arrangements/Services Living arrangements for the past 2 months: [place] Lives with:: Adult Children Patient language and need for interpreter reviewed:: Yes Do you feel safe going back to the place where you live?: Yes      Need  for Family Participation in Patient Care: Yes (Comment) Care giver support system in place?: Yes (comment) Current home services: DME Criminal Activity/Legal Involvement Pertinent to Current Situation/Hospitalization: No - Comment as needed  Activities of Daily Living Home Assistive Devices/Equipment: Walker (specify type) ADL Screening (condition at time of admission) Patient's cognitive ability adequate to safely complete daily activities?: Yes Is the patient deaf or have difficulty hearing?: No Does the patient have difficulty seeing, even when wearing glasses/contacts?: No Does the patient have difficulty concentrating, remembering, or making decisions?: No Patient able to express need for assistance with ADLs?: Yes Does the patient have difficulty dressing or bathing?: Yes Independently performs ADLs?: No Communication: Independent Dressing (OT): Needs assistance Is this a change from baseline?: Pre-admission baseline Grooming: Needs assistance Is this a change from baseline?: Pre-admission baseline Feeding: Independent Bathing: Needs assistance Is this a change from baseline?: Pre-admission baseline Toileting: Needs assistance Is this a change from baseline?: Change from baseline, expected to last <3 days In/Out Bed: Needs assistance Is this a change from baseline?: Pre-admission baseline Walks in Home: Needs assistance Is this a change from baseline?: Pre-admission baseline Does the patient have difficulty walking or climbing stairs?: Yes Weakness of Legs: Both Weakness of Arms/Hands: None  Permission Sought/Granted Permission sought to share information with : Case Manager, Customer service manager, Family Supports Permission granted to share information with : Yes, Verbal Permission Granted              Emotional Assessment Appearance::  Appears stated age     Orientation: : Oriented to Self, Oriented to Place Alcohol / Substance Use: Not Applicable Psych  Involvement: No (comment)  Admission diagnosis:  Respiratory distress [R06.03] HCAP (healthcare-associated pneumonia) [J18.9] Patient Active Problem List   Diagnosis Date Noted  . Sepsis ([place]) 11/29/2018  . Suspected Covid-19 Virus Infection 11/29/2018  . Palliative care encounter   . HCAP (healthcare-associated pneumonia) 11/20/2018  . Dehydration 11/02/2018  . Anemia 10/26/2018  . Encounter for antineoplastic chemotherapy 07/12/2018  . Rheumatoid arthritis ([place]) 07/12/2018  . Thrombocytosis ([place]) 07/12/2018  . Goals of care, counseling/discussion 05/25/2018  . Malignant neoplasm of lung ([place]) 05/25/2018  . Recurrent pneumonia 02/21/2018  . PNA (pneumonia) 02/07/2018  . Elevated erythrocyte sedimentation rate 12/16/2017  . Dry cough 08/19/2017  . Rheumatoid nodule of multiple sites ([place]) 08/19/2017  . Polyneuropathy associated with underlying disease ([place]) 05/17/2017  . Seasonal allergic rhinitis due to pollen 05/17/2017  . Essential hypertension 10/09/2016  . Pure hypercholesterolemia 10/09/2016  . Rheumatoid arthritis, seropositive ([place]) 03/27/2016  . Polyarthralgia 03/18/2016  . Diabetes mellitus type 2, uncomplicated ([place]) [id number]/[id number]  . Allergic rhinitis 05/18/2014  . Microalbuminuria 04/18/2014   PCP:  Glendon Axe, MD Pharmacy:   [id number] [place], [place] [place] [address] Phone: [phone] Fax: [phone]     Social Determinants of Health (SDOH) Interventions    Readmission Risk Interventions Readmission Risk Prevention Plan 12/01/2018  Transportation Screening Complete  Medication Review Press photographer) Complete  PCP or Specialist appointment within 3-5 days of discharge Complete  HRI or [place] Complete  SW Recovery Care/Counseling Consult Complete  [place] Not Applicable  Some recent data might be hidden

## 2018-12-08 NOTE — Progress Notes (Signed)
Patient ID: Tamara Parrish, female   DOB: October 06, 1955, 63 y.o.   MRN: [id number]  Nursing staff called me to speak with the daughter again.  I spoke with the patient's daughter Tamara Parrish.  I think I spoke with the patient's sister earlier on the phone.  Tamara Parrish wants the patient to be resuscitated but no ventilator.  CODE STATUS reversed from DNR made by palliative care over to partial code.  I explained that she is on high flow nasal cannula and she will stay in the hospital until she is off the high flow nasal cannula unable to hold her saturations.  She is on nebulizer treatments and antibiotics.  This could be just progression of her stage IV lung cancer.  Her overall prognosis is poor with repeated hospitalizations and stage IV lung cancer.  Dr Loletha Grayer

## 2018-12-08 NOTE — Assessment & Plan Note (Addendum)
#  63 year old female patient with history of metastatic squamous cell lung cancer most recently on carboplatin Taxol Beryle Flock is currently admitted to hospital for worsening shortness of breath  #Metastatic squamous lung cancer-CT scan concerning for progression.  Patient poor candidate for further chemotherapy based upon history failure/declining performance status.  #Acute on chronic respiratory failure-likely secondary to progressive malignancy question infection [clinically less likely status post multiple rounds of antibiotics].  Continue prednisone 60 mg a day-for possible but less likely immunotherapy related side effects.  #Rheumatoid arthritis-worsened; question secondary to immunotherapy.  See above/steroids  #Worsening anemia-multifactorial.  Stable at 8.  #Prognosis extremely poor: Discussed with the patient at length.  She understands her prognosis is poor/and she would qualify for hospice.  However she deferred further discussion to [facility].  I spoke to Hallam and she understands her mom's serious medical condition/and is agreeable to hospice moving forward.  Patient is currently DNR DNI.  Discussed with [name]/Dr. Tasia Catchings.

## 2018-12-09 DIAGNOSIS — L8990 Pressure ulcer of unspecified site, unspecified stage: Secondary | ICD-10-CM

## 2018-12-09 DIAGNOSIS — C799 Secondary malignant neoplasm of unspecified site: Secondary | ICD-10-CM

## 2018-12-09 DIAGNOSIS — Z515 Encounter for palliative care: Secondary | ICD-10-CM

## 2018-12-09 DIAGNOSIS — Z66 Do not resuscitate: Secondary | ICD-10-CM

## 2018-12-09 LAB — GLUCOSE, CAPILLARY
Glucose-Capillary: 271 mg/dL — ABNORMAL HIGH (ref 70–99)
Glucose-Capillary: 409 mg/dL — ABNORMAL HIGH (ref 70–99)
Glucose-Capillary: 416 mg/dL — ABNORMAL HIGH (ref 70–99)
Glucose-Capillary: 429 mg/dL — ABNORMAL HIGH (ref 70–99)
Glucose-Capillary: 300 mg/dL — ABNORMAL HIGH (ref 70–99)
Glucose-Capillary: 366 mg/dL — ABNORMAL HIGH (ref 70–99)

## 2018-12-09 LAB — BASIC METABOLIC PANEL
Anion gap: 6 (ref 5–15)
BUN: 50 mg/dL — ABNORMAL HIGH (ref 8–23)
CO2: 22 mmol/L (ref 22–32)
Calcium: 8.5 mg/dL — ABNORMAL LOW (ref 8.9–10.3)
Chloride: 107 mmol/L (ref 98–111)
Creatinine, Ser: 1.12 mg/dL — ABNORMAL HIGH (ref 0.44–1.00)
GFR calc Af Amer: >60 mL/min (ref >60)
GFR calc non Af Amer: 52 mL/min — ABNORMAL LOW (ref >60)
Glucose, Bld: 231 mg/dL — ABNORMAL HIGH (ref 70–99)
Potassium: 5.5 mmol/L — ABNORMAL HIGH (ref 3.5–5.1)
Sodium: 135 mmol/L (ref 135–145)

## 2018-12-09 LAB — URINE CULTURE: Culture: NO GROWTH

## 2018-12-09 LAB — CBC
HCT: 27.3 % — ABNORMAL LOW (ref 36.0–46.0)
Hemoglobin: 8 g/dL — ABNORMAL LOW (ref 12.0–15.0)
MCH: 29.1 pg (ref 26.0–34.0)
MCHC: 29.3 g/dL — ABNORMAL LOW (ref 30.0–36.0)
MCV: 99.3 fL (ref 80.0–100.0)
Platelets: 386 10*3/uL (ref 150–400)
RBC: 2.75 MIL/uL — ABNORMAL LOW (ref 3.87–5.11)
RDW: 16.2 % — ABNORMAL HIGH (ref 11.5–15.5)
WBC: 18.3 10*3/uL — ABNORMAL HIGH (ref 4.0–10.5)
nRBC: 0.3 % — ABNORMAL HIGH (ref 0.0–0.2)

## 2018-12-09 MED ORDER — SODIUM ZIRCONIUM CYCLOSILICATE 10 G PO PACK
10.0000 g | PACK | Freq: Once | ORAL | Status: AC
  Administered 2018-12-09: 10:00:00 10 g via ORAL
  Filled 2018-12-09: qty 1

## 2018-12-09 MED ORDER — POLYETHYLENE GLYCOL 3350 17 G PO PACK
17.0000 g | PACK | Freq: Every day | ORAL | Status: AC
  Administered 2018-12-09 – 2018-12-10 (×2): 17 g via ORAL
  Filled 2018-12-09 (×2): qty 1

## 2018-12-09 MED ORDER — SODIUM BICARBONATE 8.4 % IV SOLN
25.0000 meq | Freq: Once | INTRAVENOUS | Status: AC
  Administered 2018-12-09: 09:00:00 25 meq via INTRAVENOUS
  Filled 2018-12-09: qty 50

## 2018-12-09 MED ORDER — CALCIUM GLUCONATE-NACL 1-0.675 GM/50ML-% IV SOLN
1.0000 g | Freq: Once | INTRAVENOUS | Status: AC
  Administered 2018-12-09: 1000 mg via INTRAVENOUS
  Filled 2018-12-09: qty 50

## 2018-12-09 MED ORDER — INSULIN GLARGINE 100 UNIT/ML ~~LOC~~ SOLN
20.0000 [IU] | Freq: Every morning | SUBCUTANEOUS | Status: DC
  Administered 2018-12-10: 20 [IU] via SUBCUTANEOUS
  Filled 2018-12-09: qty 0.2

## 2018-12-09 MED ORDER — SODIUM CHLORIDE 0.9 % IV SOLN
INTRAVENOUS | Status: DC | PRN
  Administered 2018-12-09 (×2): 1000 mL via INTRAVENOUS
  Administered 2018-12-11: 500 mL via INTRAVENOUS
  Administered 2018-12-12: 16:00:00 via INTRAVENOUS
  Administered 2018-12-12: 500 mL via INTRAVENOUS
  Administered 2018-12-12: 21:00:00 20 mL via INTRAVENOUS
  Administered 2018-12-12 – 2018-12-13 (×2): 500 mL via INTRAVENOUS
  Administered 2018-12-13: 20 mL via INTRAVENOUS

## 2018-12-09 NOTE — Progress Notes (Signed)
Patient ID: Tamara Parrish, female   DOB: 11/10/1955, 63 y.o.   MRN: [id number]  [place] PROGRESS NOTE  Tamara Parrish [facility] UYQ:[id number] DOB: [birth year] DOA: 12/07/2018 PCP: Glendon Axe, MD  HPI/Subjective: Patient feeling little better today.  States she gets anxious and short of breath at times.  Still short of breath with moving around.  Objective: Vitals:   12/09/18 0431 12/09/18 0934  BP: (!) 110/98 127/61  Pulse: 73 84  Resp: 20 18  Temp: 97.6 F (36.4 C) (!) 97.5 F (36.4 C)  SpO2: 94% 99%    Intake/Output Summary (Last 24 hours) at 12/09/2018 1332 Last data filed at 12/09/2018 1329 Gross per 24 hour  Intake 857.63 ml  Output 300 ml  Net 557.63 ml   Filed Weights   12/07/18 1359  Weight: 81.6 kg    ROS: Review of Systems  Constitutional: Positive for malaise/fatigue. Negative for chills and fever.  Eyes: Negative for blurred vision.  Respiratory: Positive for cough, shortness of breath and wheezing.   Cardiovascular: Negative for chest pain.  Gastrointestinal: Negative for abdominal pain, constipation, diarrhea, nausea and vomiting.  Genitourinary: Negative for dysuria.  Musculoskeletal: Negative for joint pain.  Neurological: Negative for dizziness and headaches.   Exam: Physical Exam  Constitutional: She is oriented to person, place, and time.  HENT:  Nose: No mucosal edema.  Mouth/Throat: No oropharyngeal exudate or posterior oropharyngeal edema.  Ritta Slot has resolved  Eyes: Pupils are equal, round, and reactive to light. Conjunctivae, EOM and lids are normal.  Neck: No JVD present. Carotid bruit is not present. No edema present. No thyroid mass and no thyromegaly present.  Cardiovascular: S1 normal and S2 normal. Exam reveals no gallop.  No murmur heard. Pulses:      Dorsalis pedis pulses are 2+ on the right side and 2+ on the left side.  Respiratory: No respiratory distress. She has decreased breath sounds in the right lower field and  the left lower field. She has no wheezes. She has rhonchi in the right lower field and the left lower field. She has no rales.  GI: Soft. Bowel sounds are normal. There is no abdominal tenderness.  Musculoskeletal:     Right ankle: She exhibits swelling.     Left ankle: She exhibits swelling.  Lymphadenopathy:    She has no cervical adenopathy.  Neurological: She is alert and oriented to person, place, and time. No cranial nerve deficit.  Skin: Skin is warm. Nails show no clubbing.  Redness on the buttock  Psychiatric: She has a normal mood and affect.      Data Reviewed: Basic Metabolic Panel: Recent Labs  Lab 12/03/18 0442 12/04/18 0530 12/07/18 1354 12/08/18 0545 12/08/18 1359 12/09/18 0320  NA 136 135 134* 132*  --  135  K 4.2 4.2 4.9 5.5* 5.2* 5.5*  CL 105 103 101 103  --  107  CO2 26 25 21* 22  --  22  GLUCOSE 110* 135* 233* 318*  --  231*  BUN 15 14 22  34*  --  50*  CREATININE 0.76 0.67 0.93 1.11*  --  1.12*  CALCIUM 7.9* 8.3* 8.3* 8.4*  --  8.5*   Liver Function Tests: Recent Labs  Lab 12/07/18 1354 12/08/18 0545  AST 33 30  ALT 15 16  ALKPHOS 86 83  BILITOT 0.6 0.5  PROT 8.0 7.9  ALBUMIN 2.3* 2.1*   CBC: Recent Labs  Lab 12/03/18 0442 12/04/18 0530 12/07/18 1354 12/08/18 0545 12/09/18 0320  WBC 17.1* 16.6* 21.9* 17.8* 18.3*  NEUTROABS  --  13.9* 18.9*  --   --   HGB 8.1* 8.5* 9.0* 7.9* 8.0*  HCT 26.1* 27.7* 30.4* 26.1* 27.3*  MCV 94.6 96.2 97.4 97.4 99.3  PLT 339 353 353 382 386   Cardiac Enzymes: Recent Labs  Lab 12/07/18 1354 12/08/18 0020 12/08/18 0545  TROPONINI 0.07* 0.05* 0.04*   BNP (last 3 results) Recent Labs    11/20/18 1948 11/29/18 1559  BNP 65.0 142.0*    ProBNP (last 3 results) No results for input(s): PROBNP in the last 8760 hours.  CBG: Recent Labs  Lab 12/08/18 2214 12/09/18 0731 12/09/18 1141 12/09/18 1142 12/09/18 1215  GLUCAP 222* 271* 429* 409* 416*    Recent Results (from the past 240 hour(s))   Blood Culture (routine x 2)     Status: None   Collection Time: 11/29/18  3:59 PM  Result Value Ref Range Status   Specimen Description BLOOD RIGHT ANTECUBITAL  Final   Special Requests   Final    BOTTLES DRAWN AEROBIC AND ANAEROBIC Blood Culture adequate volume   Culture   Final    NO GROWTH 5 DAYS Performed at [facility], [address], [place], [place] [address]    Report Status 12/04/2018 FINAL  Final  Blood Culture (routine x 2)     Status: None   Collection Time: 11/29/18  3:59 PM  Result Value Ref Range Status   Specimen Description BLOOD BLOOD RIGHT FOREARM  Final   Special Requests   Final    BOTTLES DRAWN AEROBIC AND ANAEROBIC Blood Culture adequate volume   Culture   Final    NO GROWTH 5 DAYS Performed at [facility], [address], [place], [place] [address]    Report Status 12/04/2018 FINAL  Final  MRSA PCR Screening     Status: None   Collection Time: 11/29/18  6:36 PM  Result Value Ref Range Status   MRSA by PCR NEGATIVE NEGATIVE Final    Comment:        The GeneXpert MRSA Assay (FDA approved for NASAL specimens only), is one component of a comprehensive MRSA colonization surveillance program. It is not intended to diagnose MRSA infection nor to guide or monitor treatment for MRSA infections. Performed at [facility], [address] [place], [place] [address]   Novel Coronavirus, NAA (hospital order; send-out to ref lab)     Status: None   Collection Time: 11/29/18 10:32 PM  Result Value Ref Range Status   SARS-CoV-2, NAA NOT DETECTED NOT DETECTED Final    Comment: Negative (Not Detected) results do not exclude infection caused by SARS CoV 2 and should not be used as the sole basis for treatment or other patient management decisions. Optimum specimen types and timing for peak viral levels during infections caused  by SARS CoV 2 have not been determined. Collection of multiple specimens (types and time points) from the  same patient may be necessary to detect the virus. Improper specimen collection and handling, sequence variability underlying assay primers and or probes, or the presence of organisms in  quantities less than the limit of detection of the assay may lead to false negative results. Positive and negative predictive values of testing are highly dependent on prevalence. False negative results are more likely when prevalence of disease is high. (NOTE) The expected result is Negative (Not Detected). The SARS CoV 2 test is intended for the presumptive qualitative  detection of  nucleic acid from SARS CoV 2 in upper and lower  respir atory specimens. Testing methodology is real time RT PCR. Test results must be correlated with clinical presentation and  evaluated in the context of other laboratory and epidemiologic data.  Test performance can be affected because the epidemiology and  clinical spectrum of infection caused by SARS CoV 2 is not fully  known. For example, the optimum types of specimens to collect and  when during the course of infection these specimens are most likely  to contain detectable viral RNA may not be known. This test has not been Food and Drug Administration (FDA) cleared or  approved and has been authorized by FDA under an Emergency Use  Authorization (EUA). The test is only authorized for the duration of  the declaration that circumstances exist justifying the authorization  of emergency use of in vitro diagnostic tests for detection and or  diagnosis of SARS CoV 2 under Section 564(b)(1) of the Act, 21 U.S.C.  section [phone] 3(b)(1), unless the authorization is terminated or   revoked sooner. [facility] is certified under the  Clinical Laboratory Improvement Amendments of 1988 (CLIA), 42 U.S.C.  section [phone], to perform high complexity tests. Performed at [place] [id number] [address], [place], [place]  [address] Laboratory Director: Loleta Books, MD Fact Sheet for Healthcare Providers  BankingDealers.co.za Fact Sheet for Patients  StrictlyIdeas.no Performed at [facility], [place] [address], [place], [place] [address]    Coronavirus Source NASOPHARYNGEAL  Final    Comment: Performed at [facility], [address] [place], [place] [address]  Blood Culture (routine x 2)     Status: None (Preliminary result)   Collection Time: 12/07/18  2:14 PM  Result Value Ref Range Status   Specimen Description BLOOD LEFT ANTECUBITAL  Final   Special Requests   Final    BOTTLES DRAWN AEROBIC AND ANAEROBIC Blood Culture adequate volume   Culture   Final    NO GROWTH 2 DAYS Performed at [facility], [address] [place], [place] [address]    Report Status PENDING  Incomplete  Blood Culture (routine x 2)     Status: None (Preliminary result)   Collection Time: 12/07/18  2:14 PM  Result Value Ref Range Status   Specimen Description BLOOD RIGHT ANTECUBITAL  Final   Special Requests   Final    BOTTLES DRAWN AEROBIC AND ANAEROBIC Blood Culture adequate volume   Culture   Final    NO GROWTH 2 DAYS Performed at [facility], [address], [place], [place] [address]    Report Status PENDING  Incomplete  SARS Coronavirus 2 [facility] order, Performed in [place] hospital lab)     Status: None   Collection Time: 12/07/18  2:14 PM  Result Value Ref Range Status   SARS Coronavirus 2 NEGATIVE NEGATIVE Final    Comment: (NOTE) If result is NEGATIVE SARS-CoV-2 target nucleic acids are NOT DETECTED. The SARS-CoV-2 RNA is generally detectable in upper and lower  respiratory specimens during the acute phase of infection. The lowest  concentration of SARS-CoV-2 viral copies this assay can detect is 250  copies / mL. A negative result does not preclude SARS-CoV-2 infection  and should not be used as the sole basis for  treatment or other  patient management decisions.  A negative result may occur with  improper specimen collection / handling, submission of specimen other  than nasopharyngeal swab, presence of viral mutation(s) within the  areas targeted by this assay, and inadequate number of viral copies  (<250 copies / mL). A negative result must be combined with clinical  observations, patient history, and epidemiological information. If result is POSITIVE SARS-CoV-2 target nucleic acids are DETECTED. The SARS-CoV-2 RNA is generally detectable in upper and lower  respiratory specimens dur ing the acute phase of infection.  Positive  results are indicative of active infection with SARS-CoV-2.  Clinical  correlation with patient history and other diagnostic information is  necessary to determine patient infection status.  Positive results do  not rule out bacterial infection or co-infection with other viruses. If result is PRESUMPTIVE POSTIVE SARS-CoV-2 nucleic acids MAY BE PRESENT.   A presumptive positive result was obtained on the submitted specimen  and confirmed on repeat testing.  While 2019 novel coronavirus  (SARS-CoV-2) nucleic acids may be present in the submitted sample  additional confirmatory testing may be necessary for epidemiological  and / or clinical management purposes  to differentiate between  SARS-CoV-2 and other Sarbecovirus currently known to infect humans.  If clinically indicated additional testing with an alternate test  methodology [phone]) is advised. The SARS-CoV-2 RNA is generally  detectable in upper and lower respiratory sp ecimens during the acute  phase of infection. The expected result is Negative. Fact Sheet for Patients:  StrictlyIdeas.no Fact Sheet for Healthcare Providers: BankingDealers.co.za This test is not yet approved or cleared by the [place] FDA and has been authorized for detection and/or  diagnosis of SARS-CoV-2 by FDA under an Emergency Use Authorization (EUA).  This EUA will remain in effect (meaning this test can be used) for the duration of the COVID-19 declaration under Section 564(b)(1) of the Act, 21 U.S.C. section 360bbb-3(b)(1), unless the authorization is terminated or revoked sooner. Performed at [facility], [place] [address] [place], [place] [address]   Urine culture     Status: None   Collection Time: 12/08/18  3:15 AM  Result Value Ref Range Status   Specimen Description   Final    URINE, RANDOM Performed at [facility], [address], [place], [place] [address]    Special Requests   Final    Immunocompromised Performed at [facility], [address] [place], [place] [address]    Culture   Final    NO GROWTH Performed at [facility], [place] [address], [place], [place] [address]    Report Status 12/09/2018 FINAL  Final     Studies: Dg Chest Port 1 View  Result Date: 12/07/2018 CLINICAL DATA:  Fever and respiratory distress EXAM: PORTABLE CHEST 1 VIEW COMPARISON:  12/03/2018 FINDINGS: Slightly decreased lung volumes noted with stable to slight increase in bilateral interstitial/airspace opacities. LEFT pleural effusion is again noted with LEFT hemithorax volume loss. A RIGHT IJ Port-A-Cath is again identified with tip overlying the mid SVC. No pneumothorax. IMPRESSION: Decreased lung volumes with stable to slight increase in diffuse bilateral interstitial/airspace opacities. No other significant change. Electronically Signed   By: Margarette Canada M.D.   On: 12/07/2018 15:16    Scheduled Meds: . amLODipine  5 mg Oral Daily  . aspirin  81 mg Oral Daily  . atorvastatin  40 mg Oral Daily  . budesonide (PULMICORT) nebulizer solution  0.5 mg Nebulization BID  . chlorhexidine  15 mL Mouth Rinse BID  . enoxaparin (LOVENOX) injection  40 mg Subcutaneous Q24H  . hydroxychloroquine  200 mg Oral  BID  . insulin aspart  0-9 Units  Subcutaneous TID AC & HS  . [START ON 12/10/2018] insulin glargine  20 Units Subcutaneous q morning - 10a  . ipratropium-albuterol  3 mL Inhalation Q6H  . mouth rinse  15 mL Mouth Rinse q12n4p  . metoprolol tartrate  25 mg Oral BID  . oxyCODONE  20 mg Oral Q12H  . pantoprazole  40 mg Oral Daily  . polyethylene glycol  17 g Oral Daily  . predniSONE  60 mg Oral Q breakfast  . Ensure Max Protein  11 oz Oral BID BM   Continuous Infusions: . sodium chloride 1,000 mL (12/09/18 0945)  . ceFEPime (MAXIPIME) IV 2 g (12/09/18 0541)  . famotidine (PEPCID) IV Stopped (12/08/18 1733)  . fluconazole (DIFLUCAN) IV 100 mg (12/09/18 1133)    Assessment/Plan:  1. Acute on chronic hypoxic respiratory failure.  Patient on high flow nasal cannula.  Patient will need to come off of high flow nasal cannula back to her normal chronic oxygen prior to any decisions.  Oncology started prednisone 60 mg daily just in case this is reaction to [facility]. 2. Clinical sepsis.  Hard to tell if this is healthcare associated pneumonia or not.  Patient switched to cefepime 3. thrush.  IV Diflucan. 4. Stage IV cancer of the lung.  Any treatments at this point would be palliative in nature.  Dr. Tasia Catchings spoke with daughter Theadora Rama and recommended comfort care and hospice.  Daughter will talk with patient about things. 5. Rheumatoid arthritis on Plaquenil 6. Type 2 diabetes on glargine insulin and sliding scale.  Increase glargine insulin with being on steroids. 7. Hyperkalemia.  Start Ono.  Recheck BMP tomorrow morning.  Also given calcium, bicarb. 8. Hyperlipidemia unspecified on Lipitor 9. GERD on Protonix  Code Status:     Code Status Orders  (From admission, onward)         Start     Ordered   12/07/18 2346  Full code  Continuous     12/07/18 2345        Code Status History    Date Active Date Inactive Code Status Order ID Comments User Context   11/29/2018 2250 12/04/2018 1628 Full Code [id number]  Lance Coon, MD Inpatient   11/20/2018 2045 11/22/2018 1806 Full Code [id number]  Loletha Grayer, MD ED   10/26/2018 2037 10/28/2018 2050 Partial Code [id number]  Hillary Bow, MD Inpatient   10/26/2018 1728 10/26/2018 2037 DNR [id number]  Hillary Bow, MD ED   02/21/2018 2004 02/24/2018 1542 Full Code [id number]  Gladstone Lighter, MD ED   02/07/2018 2202 02/11/2018 1738 Full Code [id number]  Dustin Flock, MD Inpatient     Family Communication: Spoke with daughter Theadora Rama on the phone.  The patient has chosen her is the patient's primary contact. Disposition Plan: The patient will go to [name]'s house upon discharge  Antibiotics:  Cefepime  Diflucan  Time spent: 28 minutes including ACP time  [employer]

## 2018-12-09 NOTE — Progress Notes (Signed)
Inpatient Diabetes Program Recommendations  AACE/ADA: New Consensus Statement on Inpatient Glycemic Control  Target Ranges:  Prepandial:   less than 140 mg/dL      Peak postprandial:   less than 180 mg/dL (1-2 hours)      Critically ill patients:  140 - 180 mg/dL   Results for LAYSA, KIMMEY EVA (MRN [id number]) as of 12/09/2018 10:47  Ref. Range 12/08/2018 07:56 12/08/2018 11:37 12/08/2018 16:27 12/08/2018 20:47 12/08/2018 22:14 12/09/2018 07:31  Glucose-Capillary Latest Ref Range: 70 - 99 mg/dL 306 (H) 332 (H) 173 (H) 210 (H) 222 (H) 271 (H)   Review of Glycemic Control  Diabetes history: DM2 Outpatient Diabetes medications: Basaglar 38 units QAM Current orders for Inpatient glycemic control: Lantus 15 units QAM, Novolog 0-9 units AC&HS; Prednisone 60 mg QAM  Inpatient Diabetes Program Recommendations:  Insulin - Basal: Please consider increasing Lantus to 20 units QAM. If MD agreeable, please order one time Lantus 5 units x1 now since patient has already received Lantus 15 units today.  Thanks, Barnie Alderman, RN, MSN, CDE Diabetes Coordinator Inpatient Diabetes Program [phone] (Team Pager from 8am to 5pm)

## 2018-12-09 NOTE — Progress Notes (Signed)
Pt CGB is 409 notified Dr Leslye Peer.

## 2018-12-09 NOTE — Progress Notes (Addendum)
[place]  [phone] [phone]   Name: Tamara Parrish Date: 12/09/2018 MRN: [id number]  DOB: 1955-12-20  Patient Care Team: Glendon Axe, MD as PCP - General (Internal Medicine) Telford Nab, RN as Registered Nurse    REASON FOR CONSULTATION: Palliative Care consult requested for [phone] y.o.femalewith multiple medical problems including stage IV lung cancer (last received Keytruda on 10/12/18), COPD/puolmonary fibrosis on home O2 (2L), severe RA, and chronic pain. Patient was hospitalized 11/20/18 to 11/22/18 with hyperkalemia and AKI. She was admitted again 11/20/18 to 11/22/18 and 11/29/18 to 12/04/18 with PNA. She is now readmitted 12/08/18 with same. Patient has been seen in consultation with oncology who is concerned that respiratory failure reflects disease progression. Palliative care was consulted to help address goals.    CODE STATUS: DNR  PAST MEDICAL HISTORY: Past Medical History:  Diagnosis Date   Cancer ([place])    lung ca DX 2019   Collagen vascular disease ([place])    RA   DM2 (diabetes mellitus, type 2) ([place])    Dyspnea    Hyperlipemia    Hypertension    Osteomyelitis ([facility])    Pneumonia    RA (rheumatoid arthritis) ([place])    Rheumatoid arthritis ([place])     PAST SURGICAL HISTORY:  Past Surgical History:  Procedure Laterality Date   ENDOBRONCHIAL ULTRASOUND N/A 05/13/2018   Procedure: ENDOBRONCHIAL ULTRASOUND;  Surgeon: [name], MD;  Location: [facility]S;  Service: Pulmonary;  Laterality: N/A;   PORTA CATH INSERTION N/A 05/27/2018   Procedure: PORTA CATH INSERTION;  Surgeon: Katha Cabal, MD;  Location: [facility];  Service: Cardiovascular;  Laterality: N/A;   TOE SURGERY      HEMATOLOGY/ONCOLOGY HISTORY:    Malignant neoplasm of lung ([place])   05/25/2018 Initial Diagnosis    Malignant neoplasm of lung ([place])    05/31/2018 - 10/05/2018 Chemotherapy   The patient had palonosetron (ALOXI) injection 0.25 mg, 0.25 mg, Intravenous,  Once, 6 of 6 cycles Administration: 0.25 mg (05/31/2018), 0.25 mg (06/21/2018), 0.25 mg (07/12/2018), 0.25 mg (08/03/2018), 0.25 mg (08/25/2018), 0.25 mg (09/15/2018) pegfilgrastim-cbqv (UDENYCA) injection 6 mg, 6 mg, Subcutaneous, Once, 2 of 2 cycles Administration: 6 mg (06/02/2018) CARBOplatin (PARAPLATIN) 690 mg in sodium chloride 0.9 % 250 mL chemo infusion, 690 mg (100 % of original dose 693.6 mg), Intravenous,  Once, 6 of 6 cycles Dose modification:   (original dose 693.6 mg, Cycle 1) Administration: 690 mg (05/31/2018), 450 mg (06/21/2018), 540 mg (07/12/2018), 540 mg (08/03/2018), 450 mg (08/25/2018), 500 mg (09/15/2018) PACLitaxel (TAXOL) 342 mg in sodium chloride 0.9 % 500 mL chemo infusion (> 80mg /m2), 175 mg/m2 = 342 mg (87.5 % of original dose 200 mg/m2), Intravenous,  Once, 6 of 6 cycles Dose modification: 175 mg/m2 (original dose 200 mg/m2, Cycle 1, Reason: Patient Age), 135 mg/m2 (original dose 200 mg/m2, Cycle 3, Reason: Other (see comments), Comment: neuropathy) Administration: 342 mg (05/31/2018), 342 mg (06/21/2018), 264 mg (07/12/2018), 264 mg (08/03/2018), 264 mg (08/25/2018), 264 mg (09/15/2018)  for chemotherapy treatment.     10/12/2018 -  Chemotherapy    The patient had palonosetron (ALOXI) injection 0.25 mg, 0.25 mg, Intravenous,  Once, 1 of 6 cycles Administration: 0.25 mg (10/12/2018) CARBOplatin (PARAPLATIN) 550 mg in sodium chloride 0.9 % 250 mL chemo infusion, 550 mg (100 % of original dose 545.5 mg), Intravenous,  Once, 1 of 6 cycles Dose modification:   (original dose 545.5 mg, Cycle 1) Administration: 550 mg (10/12/2018)  PACLitaxel (TAXOL) 276 mg in sodium chloride 0.9 % 250 mL chemo infusion (> 80mg /m2), 135 mg/m2 = 276 mg (100 % of original dose 135 mg/m2), Intravenous,  Once, 1 of 6 cycles Dose modification: 135 mg/m2 (original dose 135 mg/m2, Cycle 1, Reason: Dose not tolerated) Administration:  276 mg (10/12/2018) pembrolizumab (KEYTRUDA) 200 mg in sodium chloride 0.9 % 50 mL chemo infusion, 200 mg, Intravenous, Once, 1 of 6 cycles Administration: 200 mg (10/12/2018) fosaprepitant (EMEND) 150 mg, dexamethasone (DECADRON) 12 mg in sodium chloride 0.9 % 145 mL IVPB, , Intravenous,  Once, 1 of 6 cycles Administration:  (10/12/2018)  for chemotherapy treatment.      ALLERGIES:  is allergic to ibuprofen and penicillins.  MEDICATIONS:  Current Facility-Administered Medications  Medication Dose Route Frequency Provider Last Rate Last Dose   0.9 %  sodium chloride infusion   Intravenous PRN Loletha Grayer, MD 10 mL/hr at 12/09/18 0945 1,000 mL at 12/09/18 0945   acetaminophen (TYLENOL) tablet 650 mg  650 mg Oral Q6H PRN Lance Coon, MD   650 mg at 12/08/18 0133   Or   acetaminophen (TYLENOL) suppository 650 mg  650 mg Rectal Q6H PRN Lance Coon, MD       ALPRAZolam Duanne Moron) tablet 0.5 mg  0.5 mg Oral TID PRN Lance Coon, MD   0.5 mg at 12/08/18 0134   amLODipine (NORVASC) tablet 5 mg  5 mg Oral Daily Lance Coon, MD   5 mg at 12/09/18 4098   aspirin chewable tablet 81 mg  81 mg Oral Daily Lance Coon, MD   81 mg at 12/09/18 1191   atorvastatin (LIPITOR) tablet 40 mg  40 mg Oral Daily Lance Coon, MD   40 mg at 12/09/18 4782   budesonide (PULMICORT) nebulizer solution 0.5 mg  0.5 mg Nebulization BID Loletha Grayer, MD   0.5 mg at 12/09/18 9562   calcium carbonate (TUMS - dosed in mg elemental calcium) chewable tablet 200 mg of elemental calcium  1 tablet Oral TID PRN Loletha Grayer, MD       ceFEPIme (MAXIPIME) 2 g in sodium chloride 0.9 % 100 mL IVPB  2 g Intravenous Q8H Wieting, Richard, MD 200 mL/hr at 12/09/18 0541 2 g at 12/09/18 0541   chlorhexidine (PERIDEX) 0.12 % solution 15 mL  15 mL Mouth Rinse BID Lance Coon, MD   15 mL at 12/09/18 0936   enoxaparin (LOVENOX) injection 40 mg  40 mg Subcutaneous Q24H Lance Coon, MD   40 mg at 12/08/18 2217    famotidine (PEPCID) IVPB 20 mg premix  20 mg Intravenous Q24H Loletha Grayer, MD   Stopped at 12/08/18 1733   fluconazole (DIFLUCAN) IVPB 100 mg  100 mg Intravenous q morning - 10a Loletha Grayer, MD 50 mL/hr at 12/09/18 1133 100 mg at 12/09/18 1133   hydroxychloroquine (PLAQUENIL) tablet 200 mg  200 mg Oral BID Lance Coon, MD   200 mg at 12/09/18 0940   insulin aspart (novoLOG) injection 0-9 Units  0-9 Units Subcutaneous TID AC & HS Lance Coon, MD   9 Units at 12/09/18 1227   [START ON 12/10/2018] insulin glargine (LANTUS) injection 20 Units  20 Units Subcutaneous q morning - 10a Wieting, Richard, MD       ipratropium-albuterol (DUONEB) 0.5-2.5 (3) MG/3ML nebulizer solution 3 mL  3 mL Inhalation Q6H Loletha Grayer, MD   3 mL at 12/09/18 1339   MEDLINE mouth rinse  15 mL Mouth Rinse q12n4p Lance Coon, MD   15 mL  at 12/09/18 1218   metoprolol tartrate (LOPRESSOR) tablet 25 mg  25 mg Oral BID Lance Coon, MD   25 mg at 12/09/18 0936   ondansetron (ZOFRAN) tablet 4 mg  4 mg Oral Q6H PRN Lance Coon, MD       Or   ondansetron [facility]) injection 4 mg  4 mg Intravenous Q6H PRN Lance Coon, MD       oxyCODONE (Oxy IR/ROXICODONE) immediate release tablet 10 mg  10 mg Oral Q6H PRN Lance Coon, MD   10 mg at 12/08/18 1702   oxyCODONE (OXYCONTIN) 12 hr tablet 20 mg  20 mg Oral Laurence Spates, MD   20 mg at 12/09/18 0936   pantoprazole (PROTONIX) EC tablet 40 mg  40 mg Oral Daily Lance Coon, MD   40 mg at 12/09/18 4128   polyethylene glycol (MIRALAX / GLYCOLAX) packet 17 g  17 g Oral Daily Loletha Grayer, MD       predniSONE (DELTASONE) tablet 60 mg  60 mg Oral Q breakfast Cammie Sickle, MD   60 mg at 12/09/18 0936   protein supplement (ENSURE MAX) liquid  11 oz Oral BID BM Loletha Grayer, MD   11 oz at 12/09/18 1000    VITAL SIGNS: BP 120/68 (BP Location: Right Arm)    Pulse 84    Temp 98.7 F (37.1 C) (Oral)    Resp 18    Ht 5\' 6"  (1.676 m)    Wt 180  lb (81.6 kg)    SpO2 98%    BMI 29.05 kg/m  Filed Weights   12/07/18 1359  Weight: 180 lb (81.6 kg)    Estimated body mass index is 29.05 kg/m as calculated from the following:   Height as of this encounter: 5\' 6"  (1.676 m).   Weight as of this encounter: 180 lb (81.6 kg).  LABS: CBC:    Component Value Date/Time   WBC 18.3 (H) 12/09/2018 0320   HGB 8.0 (L) 12/09/2018 0320   HGB 11.2 (L) 02/12/2014 0347   HCT 27.3 (L) 12/09/2018 0320   HCT 33.7 (L) 02/12/2014 0347   PLT 386 12/09/2018 0320   PLT 404 02/12/2014 0347   MCV 99.3 12/09/2018 0320   MCV 91 02/12/2014 0347   NEUTROABS 18.9 (H) 12/07/2018 1354   NEUTROABS 5.7 02/12/2014 0347   LYMPHSABS 0.7 12/07/2018 1354   LYMPHSABS 2.9 02/12/2014 0347   MONOABS 1.7 (H) 12/07/2018 1354   MONOABS 1.2 (H) 02/12/2014 0347   EOSABS 0.1 12/07/2018 1354   EOSABS 0.2 02/12/2014 0347   BASOSABS 0.1 12/07/2018 1354   BASOSABS 0.1 02/12/2014 0347   Comprehensive Metabolic Panel:    Component Value Date/Time   NA 135 12/09/2018 0320   NA 133 (L) 02/09/2014 0526   K 5.5 (H) 12/09/2018 0320   K 4.2 02/09/2014 0526   CL 107 12/09/2018 0320   CL 102 02/09/2014 0526   CO2 22 12/09/2018 0320   CO2 26 02/09/2014 0526   BUN 50 (H) 12/09/2018 0320   BUN 11 02/09/2014 0526   CREATININE 1.12 (H) 12/09/2018 0320   CREATININE 0.78 02/09/2014 0526   GLUCOSE 231 (H) 12/09/2018 0320   GLUCOSE 220 (H) 02/09/2014 0526   CALCIUM 8.5 (L) 12/09/2018 0320   CALCIUM 8.4 (L) 02/09/2014 0526   AST 30 12/08/2018 0545   AST 16 02/08/2014 1034   ALT 16 12/08/2018 0545   ALT 12 02/08/2014 1034   ALKPHOS 83 12/08/2018 0545   ALKPHOS 113 02/08/2014  1034   BILITOT 0.5 12/08/2018 0545   BILITOT 0.3 02/08/2014 1034   PROT 7.9 12/08/2018 0545   PROT 7.6 02/08/2014 1034   ALBUMIN 2.1 (L) 12/08/2018 0545   ALBUMIN 2.9 (L) 02/08/2014 1034    RADIOGRAPHIC STUDIES: Dg Chest 1 View  Result Date: 12/03/2018 CLINICAL DATA:  Fever. History of lung cancer  in 2019. History of pneumonia, diabetes, former smoker. EXAM: CHEST  1 VIEW COMPARISON:  Chest x-rays dated 11/29/2018 and 10/26/2018. Chest CT dated 11/29/2018. FINDINGS: Heart size and mediastinal contours appear stable. RIGHT chest wall Port-A-Cath appears stable in position with tip at the level of the mid SVC. Persistent near complete opacification of the LEFT hemithorax, with small residual aeration at the LEFT lung apex. Increased interstitial markings within the RIGHT lung suggesting developing edema. No pneumothorax seen. Osseous structures about the chest are unremarkable. IMPRESSION: Stable chest x-ray. Persistent near complete opacification of the LEFT hemithorax, compatible with the combination of cavitary LEFT upper lobe pulmonary lesion (known lung cancer) and the progressive interstitial and ill-defined airspace disease in the LEFT lower lobe better demonstrated on chest CT of 11/29/2018 with differential of metastatic progression versus superimposed pneumonia. Electronically Signed   By: Franki Cabot M.D.   On: 12/03/2018 13:55   Dg Chest 2 View  Result Date: 11/20/2018 CLINICAL DATA:  Shortness of breath.  Left lower extremity edema. EXAM: CHEST - 2 VIEW COMPARISON:  November 07, 2018 chest x-ray and chest CT FINDINGS: A right Port-A-Cath is stable. Infiltrate in the left lung has worsened in the interval. No other interval changes. IMPRESSION: Worsening infiltrate in the left lung suggesting the possibility of worsening pneumonia. Electronically Signed   By: Dorise Bullion III M.D   On: 11/20/2018 17:32   Ct Chest W Contrast  Result Date: 11/29/2018 CLINICAL DATA:  Shortness of breath and increasing oxygen requirements. EXAM: CT CHEST WITH CONTRAST TECHNIQUE: Multidetector CT imaging of the chest was performed during intravenous contrast administration. CONTRAST:  3mL OMNIPAQUE IOHEXOL 300 MG/ML  SOLN COMPARISON:  11/07/2018 FINDINGS: Cardiovascular: The heart size is normal. No substantial  pericardial effusion. Coronary artery calcification is evident. Atherosclerotic calcification is noted in the wall of the thoracic aorta. Right Port-A-Cath tip is positioned in the mid SVC. Main pulmonary arteries are enlarged. Mediastinum/Nodes: 8 mm short axis subcarinal lymph node evident. Precarinal lymph node measures up to 11 mm short axis. Multiple small nodes are seen in the prevascular space. There is no axillary lymphadenopathy. Lungs/Pleura: The central tracheobronchial airways are patent. Centrilobular and paraseptal emphysema evident. volume loss left hemithorax is similar to prior. Continued progression of interstitial and airspace disease in the left upper lobe surrounding the cavitary mass in this patient with known lung cancer.Interval progression of interstitial and irregular, ill-defined airspace disease in the left base with peripheral predominance. Areas of subpleural reticulation in the right lung are similar to prior. Upper Abdomen: Unremarkable Musculoskeletal: No worrisome lytic or sclerotic osseous abnormality. Old left rib fracture evident. IMPRESSION: 1. Volume loss left hemithorax with similar appearance of cavitary left upper lobe pulmonary lesion in this patient with known lung cancer. Since 11/07/2018, there is been progression of interstitial and ill-defined airspace disease in the left lower lobe which may represent metastatic progression or infectious etiology. 2. Borderline to mild mediastinal lymphadenopathy. Metastatic disease a concern. 3. Enlargement of the main pulmonary arteries compatible with pulmonary arterial hypertension. 4.  Aortic Atherosclerois (ICD10-170.0) 5.  Emphysema. ([id number]-G81.9) Electronically Signed   By: Verda Cumins.D.  On: 11/29/2018 18:55   Dg Chest Port 1 View  Result Date: 12/07/2018 CLINICAL DATA:  Fever and respiratory distress EXAM: PORTABLE CHEST 1 VIEW COMPARISON:  12/03/2018 FINDINGS: Slightly decreased lung volumes noted with stable to  slight increase in bilateral interstitial/airspace opacities. LEFT pleural effusion is again noted with LEFT hemithorax volume loss. A RIGHT IJ Port-A-Cath is again identified with tip overlying the mid SVC. No pneumothorax. IMPRESSION: Decreased lung volumes with stable to slight increase in diffuse bilateral interstitial/airspace opacities. No other significant change. Electronically Signed   By: Margarette Canada M.D.   On: 12/07/2018 15:16   Dg Chest Port 1 View  Result Date: 11/29/2018 CLINICAL DATA:  Shortness of breath.  Exposure to COVID-19. EXAM: PORTABLE CHEST 1 VIEW COMPARISON:  Chest x-rays dated 11/20/2018 and 11/07/2018 and chest CTs dated 11/07/2018 and 10/04/2018 FINDINGS: There is a persistent extensive infiltrate in the left lung superimposed on chronic changes including a cavitary lesion in the left midzone laterally. Overall heart size is normal. Aortic atherosclerosis. Power port in good position. Right lung is clear. No acute bone abnormality. IMPRESSION: No change in the appearance of the chest since the prior study of 11/20/2018. Persistent extensive infiltrate on the left superimposed on chronic changes demonstrated on the prior CT scans. Aortic Atherosclerosis (ICD10-I70.0). Electronically Signed   By: Lorriane Shire M.D.   On: 11/29/2018 16:06    PERFORMANCE STATUS (ECOG) : 3 - Symptomatic, >50% confined to bed  Review of Systems Unless otherwise noted, a complete review of systems is negative.  Physical Exam General: NAD, frail appearing, thin Pulmonary: on O2, somewhat labored with speaking Extremities: no edema Skin: no rashes Neurological: Weakness but otherwise nonfocal  IMPRESSION: Follow up visit made today regarding goals.  Patient says she spoke with Dr. Tasia Catchings by phone earlier and was able to relate to me that conversation including recommendation for hospice and a focus on comfort.  Patient also says she spoke regarding her CODE STATUS and recognizes that intubation and  CPR would likely prove futile.  Patient says she would prefer to "just float away."  Patient tells me that she has had some anxiety in dealing with her two daughters and the back-and-forth. She says that she would prefer for her daughter, Theadora Rama, to be the person that is primarily involved in decision making. She asked that I call [name].   I called [name] with patient in the room. Together, we again reviewed patient's goals and decision making. Patient says she would like to discharge to [name]'s house with hospice following.   We again discussed code status in detail. This conversation was witnessed by patient's nurse, Lenna Sciara. Patient stated clearly and repeatedly that she would not want to be intubated or to have her life prolonged on a ventilator. She also stated that she would not want other resuscitative measures such as CPR. She requested to be a "do not resuscitate." She says she would prefer just to be kept comfortable at end of life. [name] verbalized agreement with this decision.   PLAN: -Continue current scope of treatment -DNR -Plan for home with hospice when medically stable for discharge -alprazolam 0.5mg  TID prn for anxiety -Chaplain to assist with HCPOA/ACP  Time Total: 60 minutes  Visit consisted of counseling and education dealing with the complex and emotionally intense issues of symptom management and palliative care in the setting of serious and potentially life-threatening illness.Greater than 50%  of this time was spent counseling and coordinating care related to the above assessment  and plan.  Signed by: Altha Harm, PhD, NP-C [phone] (Work Cell)

## 2018-12-09 NOTE — Progress Notes (Signed)
I discussed with Dr. Lynett Parrish yesterday about patient's case.  Patient has had multiple admissions, declining respiratory status despite multiple course of antibiotics and recent CT in April 2020 concerning for left lower lobe lymphangitic spread/disease progression.  Patient and her daughter Tamara Parrish has discussed with palliative care Tamara Parrish yesterday morning and patient agrees on DNR/DNI.  She changed her mind after talking to her daughter Tamara Parrish. I called patient this morning, she reports feeling slightly better compared to yesterday. I had a lengthy discussion with her and we discussed the concern of disease progression and her rapid declining condition. Currently she is capable of making her decision for her CODE STATUS.  I asked her who she would appoint to make her decision if she is in the condition of not able to make decisions.  Patient tells me that "Tamara Parrish makes good position, I want Tamara Parrish to help to make decision for me".  She also says "do not tell Tamara Parrish that I sent that because I do not want to hurt her feeling".  She tells me that she does not want to be intubated.  Yesterday she has made decision that she wants CPR.   I called daughter Tamara Parrish and discussed about patient's current condition and concern of disease progression.  I recommend comfort care/hospice. Tamara Parrish will talk to her mother.

## 2018-12-09 NOTE — Progress Notes (Signed)
Tamara Parrish   DOB:June 24, 1956   BP#:[id number]    Subjective: Continues have shortness of breath.  She continues to be on high flow nasal oxygen.  Intermittent cough.  No hemoptysis.  Overall feels poorly.  Objective:  Vitals:   12/09/18 0934 12/09/18 1408  BP: 127/61 120/68  Pulse: 84   Resp: 18   Temp: (!) 97.5 F (36.4 C) 98.7 F (37.1 C)  SpO2: 99% 98%     Intake/Output Summary (Last 24 hours) at 12/09/2018 1627 Last data filed at 12/09/2018 1329 Gross per 24 hour  Intake 857.63 ml  Output 300 ml  Net 557.63 ml    Physical Exam  Constitutional: She is oriented to person, place, and time and well-developed, well-nourished, and in no distress.  Patient resting in the bed; alone.  On high flow nasal cannula  HENT:  Head: Normocephalic and atraumatic.  Mouth/Throat: Oropharynx is clear and moist. No oropharyngeal exudate.  Eyes: Pupils are equal, round, and reactive to light.  Neck: Normal range of motion. Neck supple.  Cardiovascular: Normal rate and regular rhythm.  Pulmonary/Chest: No respiratory distress. She has no wheezes.  Decreased breath sounds bilaterally.  Abdominal: Soft. Bowel sounds are normal. She exhibits no distension and no mass. There is no abdominal tenderness. There is no rebound and no guarding.  Musculoskeletal: Normal range of motion.        General: No tenderness or edema.  Neurological: She is alert and oriented to person, place, and time.  Skin: Skin is warm.  Psychiatric: Affect normal.     Labs:  Lab Results  Component Value Date   WBC 18.3 (H) 12/09/2018   HGB 8.0 (L) 12/09/2018   HCT 27.3 (L) 12/09/2018   MCV 99.3 12/09/2018   PLT 386 12/09/2018   NEUTROABS 18.9 (H) 12/07/2018    Lab Results  Component Value Date   NA 135 12/09/2018   K 5.5 (H) 12/09/2018   CL 107 12/09/2018   CO2 22 12/09/2018    Studies:  No results found.  Cancer of upper lobe of left lung [name]) #63 year old female patient with history of  metastatic squamous cell lung cancer most recently on carboplatin Taxol Beryle Flock is currently admitted to hospital for worsening shortness of breath  #Metastatic squamous lung cancer-CT scan concerning for progression.  Patient poor candidate for further chemotherapy based upon history failure/declining performance status.  #Acute on chronic respiratory failure-likely secondary to progressive malignancy question infection [clinically less likely status post multiple rounds of antibiotics].  Continue prednisone 60 mg a day-for possible but less likely immunotherapy related side effects.  #Rheumatoid arthritis-worsened; question secondary to immunotherapy.  See above/steroids  #Worsening anemia-multifactorial.  Stable at 8.  #Prognosis extremely poor: Discussed with the patient at length.  She understands her prognosis is poor/and she would qualify for hospice.  However she deferred further discussion to [facility].  I spoke to [name] and she understands her mom's serious medical condition/and is agreeable to hospice moving forward.  Patient is currently DNR DNI.  Discussed with [name]/Dr. Ladonna Snide, MD 12/09/2018  4:27 PM

## 2018-12-09 NOTE — Progress Notes (Signed)
   12/09/18 1600  Clinical Encounter Type  Visited With Patient  Visit Type Initial  Spiritual Encounters  Spiritual Needs Emotional;Other (Comment)  Ch was referred by Palliative Care to talk about Advance Directives. Pt is stressed out that her two daughters are on disagreement on many terms related to her medical care. Pt has her brother as Chauncey Reading but he is in [place] and can't come stay with her for a long period time. Pt plans to go through the AD document with her daughter Velna Hatchet who she is staying with currently when she gets discharged. Pt shared her frustration about having to deal with the emotions of her daughters and said that she doesn't want to see people cry over her. Ch provided a listening ear and did education on how to fill out the form and also let her know that chaplain service is available during her stay if she has any inquiries.

## 2018-12-09 NOTE — Progress Notes (Signed)
PT Cancellation Note  Patient Details Name: Ardine Iacovelli MRN: [id number] DOB: [birth year]   Cancelled Treatment:    Reason Eval/Treat Not Completed: Other (comment)(Per PT procotol due to elevated potassium this AM, PT will hold patient until she is more medically appropriate. PT will follow up as able.)   Lieutenant Diego PT, DPT 8:28 AM,12/09/18 [phone]

## 2018-12-09 NOTE — Progress Notes (Signed)
Phone call received from Palliative NP [name] Borders that decision has been made for patient to go home with hospice services. As patient is planning on discharging to a [facility] address this referral will be handled by the [facility]. Writer spoke with patient's daughter Theadora Rama to initiate education regarding hospice services and review DME needs. She was appreciative of the call and understands the [facility] will be following and reaching out to her. CSW Annamaria Boots made aware. Patient information and contact numbers have been faxed to referral. Thank you. Flo Shanks BSN, RN, [facility] [phone]

## 2018-12-10 LAB — BASIC METABOLIC PANEL
Anion gap: 5 (ref 5–15)
BUN: 46 mg/dL — ABNORMAL HIGH (ref 8–23)
CO2: 23 mmol/L (ref 22–32)
Calcium: 8.7 mg/dL — ABNORMAL LOW (ref 8.9–10.3)
Chloride: 108 mmol/L (ref 98–111)
Creatinine, Ser: 0.97 mg/dL (ref 0.44–1.00)
GFR calc Af Amer: >60 mL/min (ref >60)
GFR calc non Af Amer: >60 mL/min (ref >60)
Glucose, Bld: 388 mg/dL — ABNORMAL HIGH (ref 70–99)
Potassium: 4.8 mmol/L (ref 3.5–5.1)
Sodium: 136 mmol/L (ref 135–145)

## 2018-12-10 LAB — GLUCOSE, CAPILLARY
Glucose-Capillary: 362 mg/dL — ABNORMAL HIGH (ref 70–99)
Glucose-Capillary: 173 mg/dL — ABNORMAL HIGH (ref 70–99)
Glucose-Capillary: 276 mg/dL — ABNORMAL HIGH (ref 70–99)
Glucose-Capillary: 326 mg/dL — ABNORMAL HIGH (ref 70–99)

## 2018-12-10 MED ORDER — INSULIN GLARGINE 100 UNIT/ML ~~LOC~~ SOLN
30.0000 [IU] | Freq: Every morning | SUBCUTANEOUS | Status: DC
  Administered 2018-12-11: 09:00:00 30 [IU] via SUBCUTANEOUS
  Filled 2018-12-10: qty 0.3

## 2018-12-10 MED ORDER — MORPHINE SULFATE (PF) 2 MG/ML IV SOLN
1.0000 mg | INTRAVENOUS | Status: DC | PRN
  Administered 2018-12-10 – 2018-12-14 (×4): 1 mg via INTRAVENOUS
  Filled 2018-12-10 (×4): qty 1

## 2018-12-10 MED ORDER — ALPRAZOLAM 0.5 MG PO TABS
0.5000 mg | ORAL_TABLET | ORAL | Status: DC
  Administered 2018-12-10 – 2018-12-18 (×40): 0.5 mg via ORAL
  Filled 2018-12-10 (×43): qty 1

## 2018-12-10 MED ORDER — ALPRAZOLAM 0.5 MG PO TABS
0.5000 mg | ORAL_TABLET | Freq: Three times a day (TID) | ORAL | Status: DC
  Administered 2018-12-10: 09:00:00 0.5 mg via ORAL
  Filled 2018-12-10: qty 1

## 2018-12-10 NOTE — Progress Notes (Addendum)
Patient ID: Tamara Parrish, female   DOB: June 01, 1956, 63 y.o.   MRN: [id number]  [employer] PROGRESS NOTE  [facility] OZD:[id number] DOB: [birth year] DOA: 12/07/2018 PCP: Glendon Axe, MD  HPI/Subjective:  Continues to complain of shortness of breath states that she is very anxious  Objective: Vitals:   12/10/18 0802 12/10/18 0907  BP:  (!) 157/65  Pulse:  98  Resp:    Temp:    SpO2: 94% 92%    Intake/Output Summary (Last 24 hours) at 12/10/2018 1141 Last data filed at 12/10/2018 2595 Gross per 24 hour  Intake 0 ml  Output 1800 ml  Net -1800 ml   Filed Weights   12/07/18 1359  Weight: 81.6 kg    ROS: Review of Systems  Constitutional: Positive for malaise/fatigue. Negative for chills and fever.  Eyes: Negative for blurred vision.  Respiratory: Positive for cough, shortness of breath and wheezing.   Cardiovascular: Negative for chest pain.  Gastrointestinal: Negative for abdominal pain, constipation, diarrhea, nausea and vomiting.  Genitourinary: Negative for dysuria.  Musculoskeletal: Negative for joint pain.  Neurological: Negative for dizziness and headaches.   Exam: Physical Exam  Constitutional: She is oriented to person, place, and time.  HENT:  Nose: No mucosal edema.  Mouth/Throat: No oropharyngeal exudate or posterior oropharyngeal edema.  Ritta Slot has resolved  Eyes: Pupils are equal, round, and reactive to light. Conjunctivae, EOM and lids are normal.  Neck: No JVD present. Carotid bruit is not present. No edema present. No thyroid mass and no thyromegaly present.  Cardiovascular: S1 normal and S2 normal. Exam reveals no gallop.  No murmur heard. Pulses:      Dorsalis pedis pulses are 2+ on the right side and 2+ on the left side.  Respiratory: No respiratory distress. She has decreased breath sounds in the right lower field and the left lower field. She has no wheezes. She has rhonchi in the right lower field and the left lower field.  She has no rales.  GI: Soft. Bowel sounds are normal. There is no abdominal tenderness.  Musculoskeletal:     Right ankle: She exhibits swelling.     Left ankle: She exhibits swelling.  Lymphadenopathy:    She has no cervical adenopathy.  Neurological: She is alert and oriented to person, place, and time. No cranial nerve deficit.  Skin: Skin is warm. Nails show no clubbing.  Redness on the buttock  Psychiatric: She has a normal mood and affect.      Data Reviewed: Basic Metabolic Panel: Recent Labs  Lab 12/04/18 0530 12/07/18 1354 12/08/18 0545 12/08/18 1359 12/09/18 0320 12/10/18 0552  NA 135 134* 132*  --  135 136  K 4.2 4.9 5.5* 5.2* 5.5* 4.8  CL 103 101 103  --  107 108  CO2 25 21* 22  --  22 23  GLUCOSE 135* 233* 318*  --  231* 388*  BUN 14 22 34*  --  50* 46*  CREATININE 0.67 0.93 1.11*  --  1.12* 0.97  CALCIUM 8.3* 8.3* 8.4*  --  8.5* 8.7*   Liver Function Tests: Recent Labs  Lab 12/07/18 1354 12/08/18 0545  AST 33 30  ALT 15 16  ALKPHOS 86 83  BILITOT 0.6 0.5  PROT 8.0 7.9  ALBUMIN 2.3* 2.1*   CBC: Recent Labs  Lab 12/04/18 0530 12/07/18 1354 12/08/18 0545 12/09/18 0320  WBC 16.6* 21.9* 17.8* 18.3*  NEUTROABS 13.9* 18.9*  --   --   HGB 8.5* 9.0*  7.9* 8.0*  HCT 27.7* 30.4* 26.1* 27.3*  MCV 96.2 97.4 97.4 99.3  PLT 353 353 382 386   Cardiac Enzymes: Recent Labs  Lab 12/07/18 1354 12/08/18 0020 12/08/18 0545  TROPONINI 0.07* 0.05* 0.04*   BNP (last 3 results) Recent Labs    11/20/18 1948 11/29/18 1559  BNP 65.0 142.0*    ProBNP (last 3 results) No results for input(s): PROBNP in the last 8760 hours.  CBG: Recent Labs  Lab 12/09/18 1142 12/09/18 1215 12/09/18 1634 12/09/18 2048 12/10/18 0751  GLUCAP 409* 416* 300* 366* 362*    Recent Results (from the past 240 hour(s))  Blood Culture (routine x 2)     Status: None (Preliminary result)   Collection Time: 12/07/18  2:14 PM  Result Value Ref Range Status   Specimen  Description BLOOD LEFT ANTECUBITAL  Final   Special Requests   Final    BOTTLES DRAWN AEROBIC AND ANAEROBIC Blood Culture adequate volume   Culture   Final    NO GROWTH 3 DAYS Performed at [facility], [address] [place], [place] [address]    Report Status PENDING  Incomplete  Blood Culture (routine x 2)     Status: None (Preliminary result)   Collection Time: 12/07/18  2:14 PM  Result Value Ref Range Status   Specimen Description BLOOD RIGHT ANTECUBITAL  Final   Special Requests   Final    BOTTLES DRAWN AEROBIC AND ANAEROBIC Blood Culture adequate volume   Culture   Final    NO GROWTH 3 DAYS Performed at [facility], [address], [place], [place] [address]    Report Status PENDING  Incomplete  SARS Coronavirus 2 [facility] order, Performed in [place] hospital lab)     Status: None   Collection Time: 12/07/18  2:14 PM  Result Value Ref Range Status   SARS Coronavirus 2 NEGATIVE NEGATIVE Final    Comment: (NOTE) If result is NEGATIVE SARS-CoV-2 target nucleic acids are NOT DETECTED. The SARS-CoV-2 RNA is generally detectable in upper and lower  respiratory specimens during the acute phase of infection. The lowest  concentration of SARS-CoV-2 viral copies this assay can detect is 250  copies / mL. A negative result does not preclude SARS-CoV-2 infection  and should not be used as the sole basis for treatment or other  patient management decisions.  A negative result may occur with  improper specimen collection / handling, submission of specimen other  than nasopharyngeal swab, presence of viral mutation(s) within the  areas targeted by this assay, and inadequate number of viral copies  (<250 copies / mL). A negative result must be combined with clinical  observations, patient history, and epidemiological information. If result is POSITIVE SARS-CoV-2 target nucleic acids are DETECTED. The SARS-CoV-2 RNA is generally detectable in upper and lower   respiratory specimens dur ing the acute phase of infection.  Positive  results are indicative of active infection with SARS-CoV-2.  Clinical  correlation with patient history and other diagnostic information is  necessary to determine patient infection status.  Positive results do  not rule out bacterial infection or co-infection with other viruses. If result is PRESUMPTIVE POSTIVE SARS-CoV-2 nucleic acids MAY BE PRESENT.   A presumptive positive result was obtained on the submitted specimen  and confirmed on repeat testing.  While 2019 novel coronavirus  (SARS-CoV-2) nucleic acids may be present in the submitted sample  additional confirmatory testing may be necessary for epidemiological  and / or clinical  management purposes  to differentiate between  SARS-CoV-2 and other Sarbecovirus currently known to infect humans.  If clinically indicated additional testing with an alternate test  methodology [phone]) is advised. The SARS-CoV-2 RNA is generally  detectable in upper and lower respiratory sp ecimens during the acute  phase of infection. The expected result is Negative. Fact Sheet for Patients:  StrictlyIdeas.no Fact Sheet for Healthcare Providers: BankingDealers.co.za This test is not yet approved or cleared by the [place] FDA and has been authorized for detection and/or diagnosis of SARS-CoV-2 by FDA under an Emergency Use Authorization (EUA).  This EUA will remain in effect (meaning this test can be used) for the duration of the COVID-19 declaration under Section 564(b)(1) of the Act, 21 U.S.C. section 360bbb-3(b)(1), unless the authorization is terminated or revoked sooner. Performed at [facility], [place] [address] [place], [place] [address]   Urine culture     Status: None   Collection Time: 12/08/18  3:15 AM  Result Value Ref Range Status   Specimen Description   Final    URINE, RANDOM Performed at [facility], [address], [place], [place] [address]    Special Requests   Final    Immunocompromised Performed at [facility], [address] [place], [place] [address]    Culture   Final    NO GROWTH Performed at [facility], [place] [address], [place], [place] [address]    Report Status 12/09/2018 FINAL  Final     Studies: No results found.  Scheduled Meds: . ALPRAZolam  0.5 mg Oral Q4H  . amLODipine  5 mg Oral Daily  . aspirin  81 mg Oral Daily  . atorvastatin  40 mg Oral Daily  . budesonide (PULMICORT) nebulizer solution  0.5 mg Nebulization BID  . chlorhexidine  15 mL Mouth Rinse BID  . enoxaparin (LOVENOX) injection  40 mg Subcutaneous Q24H  . hydroxychloroquine  200 mg Oral BID  . insulin aspart  0-9 Units Subcutaneous TID AC & HS  . insulin glargine  20 Units Subcutaneous q morning - 10a  . ipratropium-albuterol  3 mL Inhalation Q6H  . mouth rinse  15 mL Mouth Rinse q12n4p  . metoprolol tartrate  25 mg Oral BID  . oxyCODONE  20 mg Oral Q12H  . pantoprazole  40 mg Oral Daily  . predniSONE  60 mg Oral Q breakfast  . Ensure Max Protein  11 oz Oral BID BM   Continuous Infusions: . sodium chloride 1,000 mL (12/09/18 1658)  . ceFEPime (MAXIPIME) IV 2 g (12/10/18 0540)  . famotidine (PEPCID) IV 20 mg (12/09/18 1816)  . fluconazole (DIFLUCAN) IV 100 mg (12/10/18 7253)    Assessment/Plan:  1. Acute on chronic hypoxic respiratory failure.  Patient on high flow nasal cannula.   No significant improvement continue prednisone prognosis overall poor 2. Clinical sepsis.  Hard to tell if this is healthcare associated pneumonia or not.  Continue cefepime  3. thrush.  IV Diflucan. 4. Stage IV cancer of the lung.  Any treatments at this point would be palliative in nature.  Dr. Tasia Catchings spoke with daughter Theadora Rama and recommended comfort care and hospice.   5. Rheumatoid arthritis on Plaquenil 6. Type 2 diabetes on glargine insulin and sliding scale.  Increase  glargine insulin with being on steroids. 7. Hyperkalemia.  Now resolved  8. hyperlipidemia unspecified on Lipitor 9. GERD on Protonix  Code Status:     Code Status Orders  (From  admission, onward)         Start     Ordered   12/07/18 2346  Full code  Continuous     12/07/18 2345        Code Status History    Date Active Date Inactive Code Status Order ID Comments User Context   11/29/2018 2250 12/04/2018 1628 Full Code [id number]  Lance Coon, MD Inpatient   11/20/2018 2045 11/22/2018 1806 Full Code [id number]  Loletha Grayer, MD ED   10/26/2018 2037 10/28/2018 2050 Partial Code [id number]  Hillary Bow, MD Inpatient   10/26/2018 1728 10/26/2018 2037 DNR [id number]  Hillary Bow, MD ED   02/21/2018 2004 02/24/2018 1542 Full Code [id number]  Gladstone Lighter, MD ED   02/07/2018 2202 02/11/2018 1738 Full Code [id number]  Dustin Flock, MD Inpatient     Family Communication: Spoke with daughter Theadora Rama on the phone.  The patient has chosen her is the patient's primary contact. Disposition Plan: The patient will go to [name]'s house upon discharge  Antibiotics:  Cefepime  Diflucan  Time spent: 28 minutes  [place]

## 2018-12-10 NOTE — Progress Notes (Signed)
PT Cancellation Note  Patient Details Name: Tamara Parrish MRN: [id number] DOB: 09/03/1955   Cancelled Treatment:    Reason Eval/Treat Not Completed: Other (comment)(Per chart review, POC has been changed. PT spoke with case management to confirm plan is hospice, no acute PT needs at this time. PT to sign off.)   Lieutenant Diego PT, DPT 1:09 PM,12/10/18 [phone]

## 2018-12-10 NOTE — Progress Notes (Signed)
[facility] ([facility])   Continue to follow for [facility] admission at home after discharge. Chart and patient information has been reviewed and eligibility has been confirmed by [facility] physician.   Spoke with daughter Theadora Rama by phone to explain services and confirm interest. Confirmed plan to discharge to [place] home in [place]. Per discussion, plan is for discharge to home by [facility] when medically ready.   Please send signed and completed DNR form home with patient/family.   Patient will need prescriptions for discharge comfort medications.  DME needs discussed and ordered from [employer]. [name] requested hospital bed with top rails, OBT, BSC and oxygen setup to support above 10 L. Per [employer], two 10L concentrators to be delivered to home. Theadora Rama is contact to coordinate with [employer].   [facility] aware of the above. Completed discharge summary will need to be faxed to [facility] at [phone] when final.  Please notify [facility] when patient is ready to leave the unit at discharge. (Call [phone] between 8:30 and 5pm. Call [phone] after 5pm.)   Please call with hospice related questions.  Thank you for this referral. [facility] Liaison will continue to follow until discharge.   Erling Conte, LCSW [phone]   [place] are listed on AMION under Hospice and [place].

## 2018-12-10 NOTE — Progress Notes (Signed)
Pt changed to heated HFNC per MD order.

## 2018-12-11 ENCOUNTER — Inpatient Hospital Stay: Payer: Medicaid Other

## 2018-12-11 LAB — GLUCOSE, CAPILLARY
Glucose-Capillary: 210 mg/dL — ABNORMAL HIGH (ref 70–99)
Glucose-Capillary: 250 mg/dL — ABNORMAL HIGH (ref 70–99)
Glucose-Capillary: 411 mg/dL — ABNORMAL HIGH (ref 70–99)
Glucose-Capillary: 514 mg/dL (ref 70–99)
Glucose-Capillary: 287 mg/dL — ABNORMAL HIGH (ref 70–99)

## 2018-12-11 LAB — PROCALCITONIN: Procalcitonin: 0.11 ng/mL

## 2018-12-11 MED ORDER — INSULIN ASPART 100 UNIT/ML ~~LOC~~ SOLN
20.0000 [IU] | Freq: Once | SUBCUTANEOUS | Status: AC
  Administered 2018-12-11: 20:00:00 20 [IU] via SUBCUTANEOUS
  Filled 2018-12-11: qty 1

## 2018-12-11 MED ORDER — INSULIN GLARGINE 100 UNIT/ML ~~LOC~~ SOLN
36.0000 [IU] | Freq: Every morning | SUBCUTANEOUS | Status: DC
  Administered 2018-12-12 – 2018-12-15 (×4): 36 [IU] via SUBCUTANEOUS
  Filled 2018-12-11 (×4): qty 0.36

## 2018-12-11 NOTE — Progress Notes (Signed)
Pt's pre-dinner glucose was 411mg /dL. Attending Dr Posey Pronto informed. No new orders received at this time. Pt received 9 units Novolog per standing per sliding scale. Pt was asymptomatic.

## 2018-12-11 NOTE — Progress Notes (Signed)
Pt BS 514, MD aware new order for 20u novolog once

## 2018-12-11 NOTE — Progress Notes (Signed)
Entered room around 2000 to assess patient for the shift. Patient with increased work of breathing. Patient slightly confused. Vital signs stable except RR 30's. Patient very restless and stating she does not know why she can't calm down. Patient not yet due for xanax. Patient reports no improvement in her anxiety with prior doses. Lung sounds unchanged. Paged Dr. Jannifer Franklin. He ordered oxygen change to heated HFNC. Patient given dose of IV morphine to assist with increased work of breathing.   2145- Patient now on heated HFNC. She was extremely anxious during the transition but is now resting comfortably. She is on 45L 60%. Sats are 95%. Patient repositioned in bed for comfort. Patient resting with eyes closed when this RN left the room.

## 2018-12-11 NOTE — Progress Notes (Addendum)
Patient ID: Tamara Parrish, female   DOB: [birth year], 63 y.o.   MRN: [id number]  [employer] PROGRESS NOTE  Tamara Parrish [facility] BJS:[id number] DOB: August 19, 1956 DOA: 12/07/2018 PCP: Glendon Axe, MD  HPI/Subjective:  Patient states that she wants to go home continues to require high flow oxygen  Objective: Vitals:   12/11/18 0744 12/11/18 0927  BP:  140/85  Pulse:  85  Resp:    Temp:    SpO2: 98% 100%    Intake/Output Summary (Last 24 hours) at 12/11/2018 1141 Last data filed at 12/11/2018 0653 Gross per 24 hour  Intake 952.23 ml  Output 1175 ml  Net -222.77 ml   Filed Weights   12/07/18 1359  Weight: 81.6 kg    ROS: Review of Systems  Constitutional: Positive for malaise/fatigue. Negative for chills and fever.  Eyes: Negative for blurred vision.  Respiratory: Positive for cough, shortness of breath and wheezing.   Cardiovascular: Negative for chest pain.  Gastrointestinal: Negative for abdominal pain, constipation, diarrhea, nausea and vomiting.  Genitourinary: Negative for dysuria.  Musculoskeletal: Negative for joint pain.  Neurological: Negative for dizziness and headaches.   Exam: Physical Exam  Constitutional: She is oriented to person, place, and time.  HENT:  Nose: No mucosal edema.  Mouth/Throat: No oropharyngeal exudate or posterior oropharyngeal edema.  Tamara Parrish has resolved  Eyes: Pupils are equal, round, and reactive to light. Conjunctivae, EOM and lids are normal.  Neck: No JVD present. Carotid bruit is not present. No edema present. No thyroid mass and no thyromegaly present.  Cardiovascular: S1 normal and S2 normal. Exam reveals no gallop.  No murmur heard. Pulses:      Dorsalis pedis pulses are 2+ on the right side and 2+ on the left side.  Respiratory: No respiratory distress. She has decreased breath sounds in the right lower field and the left lower field. She has no wheezes. She has rhonchi in the right lower field and the left lower  field. She has no rales.  GI: Soft. Bowel sounds are normal. There is no abdominal tenderness.  Musculoskeletal:     Right ankle: She exhibits swelling.     Left ankle: She exhibits swelling.  Lymphadenopathy:    She has no cervical adenopathy.  Neurological: She is alert and oriented to person, place, and time. No cranial nerve deficit.  Skin: Skin is warm. Nails show no clubbing.  Redness on the buttock  Psychiatric: She has a normal mood and affect.      Data Reviewed: Basic Metabolic Panel: Recent Labs  Lab 12/07/18 1354 12/08/18 0545 12/08/18 1359 12/09/18 0320 12/10/18 0552  NA 134* 132*  --  135 136  K 4.9 5.5* 5.2* 5.5* 4.8  CL 101 103  --  107 108  CO2 21* 22  --  22 23  GLUCOSE 233* 318*  --  231* 388*  BUN 22 34*  --  50* 46*  CREATININE 0.93 1.11*  --  1.12* 0.97  CALCIUM 8.3* 8.4*  --  8.5* 8.7*   Liver Function Tests: Recent Labs  Lab 12/07/18 1354 12/08/18 0545  AST 33 30  ALT 15 16  ALKPHOS 86 83  BILITOT 0.6 0.5  PROT 8.0 7.9  ALBUMIN 2.3* 2.1*   CBC: Recent Labs  Lab 12/07/18 1354 12/08/18 0545 12/09/18 0320  WBC 21.9* 17.8* 18.3*  NEUTROABS 18.9*  --   --   HGB 9.0* 7.9* 8.0*  HCT 30.4* 26.1* 27.3*  MCV 97.4 97.4 99.3  PLT 353  382 386   Cardiac Enzymes: Recent Labs  Lab 12/07/18 1354 12/08/18 0020 12/08/18 0545  TROPONINI 0.07* 0.05* 0.04*   BNP (last 3 results) Recent Labs    11/20/18 1948 11/29/18 1559  BNP 65.0 142.0*    ProBNP (last 3 results) No results for input(s): PROBNP in the last 8760 hours.  CBG: Recent Labs  Lab 12/10/18 0751 12/10/18 1151 12/10/18 1636 12/10/18 2059 12/11/18 0811  GLUCAP 362* 173* 276* 326* 210*    Recent Results (from the past 240 hour(s))  Blood Culture (routine x 2)     Status: None (Preliminary result)   Collection Time: 12/07/18  2:14 PM  Result Value Ref Range Status   Specimen Description BLOOD LEFT ANTECUBITAL  Final   Special Requests   Final    BOTTLES DRAWN  AEROBIC AND ANAEROBIC Blood Culture adequate volume   Culture   Final    NO GROWTH 4 DAYS Performed at [facility], [address] [place], [place] [address]    Report Status PENDING  Incomplete  Blood Culture (routine x 2)     Status: None (Preliminary result)   Collection Time: 12/07/18  2:14 PM  Result Value Ref Range Status   Specimen Description BLOOD RIGHT ANTECUBITAL  Final   Special Requests   Final    BOTTLES DRAWN AEROBIC AND ANAEROBIC Blood Culture adequate volume   Culture   Final    NO GROWTH 4 DAYS Performed at [facility], [address], [place], [place] [address]    Report Status PENDING  Incomplete  SARS Coronavirus 2 [facility] order, Performed in [facility] lab)     Status: None   Collection Time: 12/07/18  2:14 PM  Result Value Ref Range Status   SARS Coronavirus 2 NEGATIVE NEGATIVE Final    Comment: (NOTE) If result is NEGATIVE SARS-CoV-2 target nucleic acids are NOT DETECTED. The SARS-CoV-2 RNA is generally detectable in upper and lower  respiratory specimens during the acute phase of infection. The lowest  concentration of SARS-CoV-2 viral copies this assay can detect is 250  copies / mL. A negative result does not preclude SARS-CoV-2 infection  and should not be used as the sole basis for treatment or other  patient management decisions.  A negative result may occur with  improper specimen collection / handling, submission of specimen other  than nasopharyngeal swab, presence of viral mutation(s) within the  areas targeted by this assay, and inadequate number of viral copies  (<250 copies / mL). A negative result must be combined with clinical  observations, patient history, and epidemiological information. If result is POSITIVE SARS-CoV-2 target nucleic acids are DETECTED. The SARS-CoV-2 RNA is generally detectable in upper and lower  respiratory specimens dur ing the acute phase of infection.  Positive  results are  indicative of active infection with SARS-CoV-2.  Clinical  correlation with patient history and other diagnostic information is  necessary to determine patient infection status.  Positive results do  not rule out bacterial infection or co-infection with other viruses. If result is PRESUMPTIVE POSTIVE SARS-CoV-2 nucleic acids MAY BE PRESENT.   A presumptive positive result was obtained on the submitted specimen  and confirmed on repeat testing.  While 2019 novel coronavirus  (SARS-CoV-2) nucleic acids may be present in the submitted sample  additional confirmatory testing may be necessary for epidemiological  and / or clinical management purposes  to differentiate between  SARS-CoV-2 and other Sarbecovirus currently known to infect humans.  If  clinically indicated additional testing with an alternate test  methodology [phone]) is advised. The SARS-CoV-2 RNA is generally  detectable in upper and lower respiratory sp ecimens during the acute  phase of infection. The expected result is Negative. Fact Sheet for Patients:  StrictlyIdeas.no Fact Sheet for Healthcare Providers: BankingDealers.co.za This test is not yet approved or cleared by the [place] FDA and has been authorized for detection and/or diagnosis of SARS-CoV-2 by FDA under an Emergency Use Authorization (EUA).  This EUA will remain in effect (meaning this test can be used) for the duration of the COVID-19 declaration under Section 564(b)(1) of the Act, 21 U.S.C. section 360bbb-3(b)(1), unless the authorization is terminated or revoked sooner. Performed at [facility], [place] [address], [place], [place] [address]   Urine culture     Status: None   Collection Time: 12/08/18  3:15 AM  Result Value Ref Range Status   Specimen Description   Final    URINE, RANDOM Performed at [facility], [address], [place], [place] [address]    Special Requests   Final     Immunocompromised Performed at [facility], [address] [place], [place] [address]    Culture   Final    NO GROWTH Performed at [facility], [place] [address], [place], [place] [address]    Report Status 12/09/2018 FINAL  Final     Studies: Dg Chest Port 1 View  Result Date: 12/11/2018 CLINICAL DATA:  Shortness of breath. History of lung cancer. EXAM: PORTABLE CHEST 1 VIEW COMPARISON:  12/07/2018 FINDINGS: Right jugular Port-A-Cath terminates over the mid SVC. The cardiomediastinal silhouette is unchanged with persistent obscuration of the left heart border. Lung volumes remain low. Diffuse bilateral interstitial and patchy airspace opacities are again seen involving the left greater than right lungs with slight interval worsening including in the right lower lung. A left pleural effusion persists. No pneumothorax is identified. IMPRESSION: Slight worsening of bilateral airspace disease. Electronically Signed   By: Logan Bores M.D.   On: 12/11/2018 09:13    Scheduled Meds: . ALPRAZolam  0.5 mg Oral Q4H  . amLODipine  5 mg Oral Daily  . aspirin  81 mg Oral Daily  . atorvastatin  40 mg Oral Daily  . budesonide (PULMICORT) nebulizer solution  0.5 mg Nebulization BID  . chlorhexidine  15 mL Mouth Rinse BID  . enoxaparin (LOVENOX) injection  40 mg Subcutaneous Q24H  . hydroxychloroquine  200 mg Oral BID  . insulin aspart  0-9 Units Subcutaneous TID AC & HS  . insulin glargine  30 Units Subcutaneous q morning - 10a  . ipratropium-albuterol  3 mL Inhalation Q6H  . mouth rinse  15 mL Mouth Rinse q12n4p  . metoprolol tartrate  25 mg Oral BID  . oxyCODONE  20 mg Oral Q12H  . pantoprazole  40 mg Oral Daily  . predniSONE  60 mg Oral Q breakfast  . Ensure Max Protein  11 oz Oral BID BM   Continuous Infusions: . sodium chloride 10 mL/hr at 12/10/18 1300  . ceFEPime (MAXIPIME) IV 2 g (12/11/18 9326)  . famotidine (PEPCID) IV 20 mg (12/10/18 1436)  . fluconazole  (DIFLUCAN) IV 100 mg (12/11/18 7124)    Assessment/Plan:  1. Acute on chronic hypoxic respiratory failure.  Patient on high flow nasal cannula.   No significant improvement continue prednisone prognosis overall poor, repeat chest x-ray shows worsening I will repeat a procalcitonin level 2. Clinical sepsis.  Hard to tell if this is healthcare associated pneumonia or not.  Continue cefepime  3. thrush.  IV Diflucan. 4. Stage IV cancer of the lung.  Any treatments at this point would be palliative in nature.  Dr. Tasia Catchings spoke with daughter Theadora Rama and recommended comfort care and hospice.   5. Rheumatoid arthritis on Plaquenil 6. Type 2 diabetes on glargine insulin and sliding scale.  I we will can increase her glargine again 7. Hyperkalemia.  Now resolved  8. hyperlipidemia unspecified on Lipitor 9. GERD on Protonix  Code Status:     Code Status Orders  (From admission, onward)         Start     Ordered   12/07/18 2346  Full code  Continuous     12/07/18 2345        Code Status History    Date Active Date Inactive Code Status Order ID Comments User Context   11/29/2018 2250 12/04/2018 1628 Full Code [id number]  Lance Coon, MD Inpatient   11/20/2018 2045 11/22/2018 1806 Full Code [id number]  Loletha Grayer, MD ED   10/26/2018 2037 10/28/2018 2050 Partial Code [id number]  Hillary Bow, MD Inpatient   10/26/2018 1728 10/26/2018 2037 DNR [id number]  Hillary Bow, MD ED   02/21/2018 2004 02/24/2018 1542 Full Code [id number]  Gladstone Lighter, MD ED   02/07/2018 2202 02/11/2018 1738 Full Code [id number]  Dustin Flock, MD Inpatient     Family Communication: Spoke with daughter Theadora Rama on the phone.  The patient has chosen her is the patient's primary contact. Disposition Plan: The patient will go to [name] house upon discharge  Antibiotics:  Cefepime  Diflucan  Time spent: 28 minutes  Elmer

## 2018-12-12 DIAGNOSIS — Z7189 Other specified counseling: Secondary | ICD-10-CM

## 2018-12-12 DIAGNOSIS — C3490 Malignant neoplasm of unspecified part of unspecified bronchus or lung: Secondary | ICD-10-CM

## 2018-12-12 DIAGNOSIS — Z9981 Dependence on supplemental oxygen: Secondary | ICD-10-CM

## 2018-12-12 DIAGNOSIS — J9621 Acute and chronic respiratory failure with hypoxia: Secondary | ICD-10-CM

## 2018-12-12 DIAGNOSIS — R0603 Acute respiratory distress: Secondary | ICD-10-CM

## 2018-12-12 DIAGNOSIS — R0602 Shortness of breath: Secondary | ICD-10-CM

## 2018-12-12 LAB — GLUCOSE, CAPILLARY
Glucose-Capillary: 145 mg/dL — ABNORMAL HIGH (ref 70–99)
Glucose-Capillary: 137 mg/dL — ABNORMAL HIGH (ref 70–99)
Glucose-Capillary: 174 mg/dL — ABNORMAL HIGH (ref 70–99)
Glucose-Capillary: 250 mg/dL — ABNORMAL HIGH (ref 70–99)

## 2018-12-12 LAB — CULTURE, BLOOD (ROUTINE X 2)
Culture: NO GROWTH
Culture: NO GROWTH
Special Requests: ADEQUATE
Special Requests: ADEQUATE

## 2018-12-12 NOTE — Progress Notes (Signed)
Inpatient Diabetes Program Recommendations  AACE/ADA: New Consensus Statement on Inpatient Glycemic Control  Target Ranges:  Prepandial:   less than 140 mg/dL      Peak postprandial:   less than 180 mg/dL (1-2 hours)      Critically ill patients:  140 - 180 mg/dL  Results for Tamara Parrish, Tamara Parrish (MRN [id number]) as of 12/12/2018 09:39  Ref. Range 12/11/2018 08:11 12/11/2018 11:59 12/11/2018 16:43 12/11/2018 19:47 12/11/2018 22:20 12/12/2018 07:48  Glucose-Capillary Latest Ref Range: 70 - 99 mg/dL 210 (H) 250 (H) 411 (H) 514 (HH) 287 (H) 145 (H)    Review of Glycemic Control  Diabetes history: DM2 Outpatient Diabetes medications: Basaglar 38 units QAM Current orders for Inpatient glycemic control: Lantus 36 units QAM, Novolog 0-9 units AC&HS; Prednisone 60 mg QAM   Inpatient Diabetes Program Recommendations:   Insulin-Meal Coverage: Post prandial glucose is consistently elevated. If steroids are continued, please consider ordering Novolog 4 units TID with meals for meal coverage if patient eats at least 50% of meals.  Thanks, Barnie Alderman, RN, MSN, CDE Diabetes Coordinator Inpatient Diabetes Program [phone] (Team Pager from 8am to 5pm)

## 2018-12-12 NOTE — Progress Notes (Signed)
   12/12/18 1700  Clinical Encounter Type  Visited With Patient and family together  Visit Type Follow-up;Psychological support;Spiritual support  Spiritual Encounters  Spiritual Needs Prayer;Emotional  Ch received an OR for End of Life care. Pt's two daughters Tamara Parrish and Tamara Parrish were at bedside. Ch noticed some tension between two daughters regarding patient care. Ch made sure that both felt that they were heard. Ch also pointed out their caring heart for their mother recognizing and honoring each of their positions. Both daughters communicated well with the ch and tried their best to focus on what is best for the pt. Ch offered a prayer. The visit was appreciated.

## 2018-12-12 NOTE — Progress Notes (Signed)
Patient ID: Tamara Parrish, female   DOB: 1956-05-20, 63 y.o.   MRN: [id number]  [facility] PROGRESS NOTE  Tannisha Kennington Hosick WUJ:[id number] DOB: 1956-02-08 DOA: 12/07/2018 PCP: Glendon Axe, MD  HPI/Subjective:  Continues to be on high flow oxygen chest x-ray shows worsening   Objective: Vitals:   12/12/18 0800 12/12/18 0846  BP:  128/64  Pulse:  80  Resp:    Temp:  97.8 F (36.6 C)  SpO2: 98% 97%    Intake/Output Summary (Last 24 hours) at 12/12/2018 1145 Last data filed at 12/12/2018 0913 Gross per 24 hour  Intake 925.14 ml  Output 2100 ml  Net -1174.86 ml   Filed Weights   12/07/18 1359  Weight: 81.6 kg    ROS: Review of Systems  Constitutional: Positive for malaise/fatigue. Negative for chills and fever.  Eyes: Negative for blurred vision.  Respiratory: Positive for cough, shortness of breath and wheezing.   Cardiovascular: Negative for chest pain.  Gastrointestinal: Negative for abdominal pain, constipation, diarrhea, nausea and vomiting.  Genitourinary: Negative for dysuria.  Musculoskeletal: Negative for joint pain.  Neurological: Negative for dizziness and headaches.   Exam: Physical Exam  Constitutional: She is oriented to person, place, and time.  HENT:  Nose: No mucosal edema.  Mouth/Throat: No oropharyngeal exudate or posterior oropharyngeal edema.  Ritta Slot has resolved  Eyes: Pupils are equal, round, and reactive to light. Conjunctivae, EOM and lids are normal.  Neck: No JVD present. Carotid bruit is not present. No edema present. No thyroid mass and no thyromegaly present.  Cardiovascular: S1 normal and S2 normal. Exam reveals no gallop.  No murmur heard. Pulses:      Dorsalis pedis pulses are 2+ on the right side and 2+ on the left side.  Respiratory: No respiratory distress. She has decreased breath sounds in the right lower field and the left lower field. She has no wheezes. She has rhonchi in the right lower field and the left lower  field. She has no rales.  GI: Soft. Bowel sounds are normal. There is no abdominal tenderness.  Musculoskeletal:     Right ankle: She exhibits swelling.     Left ankle: She exhibits swelling.  Lymphadenopathy:    She has no cervical adenopathy.  Neurological: She is alert and oriented to person, place, and time. No cranial nerve deficit.  Skin: Skin is warm. Nails show no clubbing.  Redness on the buttock  Psychiatric: She has a normal mood and affect.      Data Reviewed: Basic Metabolic Panel: Recent Labs  Lab 12/07/18 1354 12/08/18 0545 12/08/18 1359 12/09/18 0320 12/10/18 0552  NA 134* 132*  --  135 136  K 4.9 5.5* 5.2* 5.5* 4.8  CL 101 103  --  107 108  CO2 21* 22  --  22 23  GLUCOSE 233* 318*  --  231* 388*  BUN 22 34*  --  50* 46*  CREATININE 0.93 1.11*  --  1.12* 0.97  CALCIUM 8.3* 8.4*  --  8.5* 8.7*   Liver Function Tests: Recent Labs  Lab 12/07/18 1354 12/08/18 0545  AST 33 30  ALT 15 16  ALKPHOS 86 83  BILITOT 0.6 0.5  PROT 8.0 7.9  ALBUMIN 2.3* 2.1*   CBC: Recent Labs  Lab 12/07/18 1354 12/08/18 0545 12/09/18 0320  WBC 21.9* 17.8* 18.3*  NEUTROABS 18.9*  --   --   HGB 9.0* 7.9* 8.0*  HCT 30.4* 26.1* 27.3*  MCV 97.4 97.4 99.3  PLT  353 382 386   Cardiac Enzymes: Recent Labs  Lab 12/07/18 1354 12/08/18 0020 12/08/18 0545  TROPONINI 0.07* 0.05* 0.04*   BNP (last 3 results) Recent Labs    11/20/18 1948 11/29/18 1559  BNP 65.0 142.0*    ProBNP (last 3 results) No results for input(s): PROBNP in the last 8760 hours.  CBG: Recent Labs  Lab 12/11/18 1159 12/11/18 1643 12/11/18 1947 12/11/18 2220 12/12/18 0748  GLUCAP 250* 411* 514* 287* 145*    Recent Results (from the past 240 hour(s))  Blood Culture (routine x 2)     Status: None   Collection Time: 12/07/18  2:14 PM  Result Value Ref Range Status   Specimen Description BLOOD LEFT ANTECUBITAL  Final   Special Requests   Final    BOTTLES DRAWN AEROBIC AND ANAEROBIC Blood  Culture adequate volume   Culture   Final    NO GROWTH 5 DAYS Performed at [facility], [address] [place], [place] [address]    Report Status 12/12/2018 FINAL  Final  Blood Culture (routine x 2)     Status: None   Collection Time: 12/07/18  2:14 PM  Result Value Ref Range Status   Specimen Description BLOOD RIGHT ANTECUBITAL  Final   Special Requests   Final    BOTTLES DRAWN AEROBIC AND ANAEROBIC Blood Culture adequate volume   Culture   Final    NO GROWTH 5 DAYS Performed at [facility], [address], [place], [place] [address]    Report Status 12/12/2018 FINAL  Final  SARS Coronavirus 2 [facility] order, Performed in [place] hospital lab)     Status: None   Collection Time: 12/07/18  2:14 PM  Result Value Ref Range Status   SARS Coronavirus 2 NEGATIVE NEGATIVE Final    Comment: (NOTE) If result is NEGATIVE SARS-CoV-2 target nucleic acids are NOT DETECTED. The SARS-CoV-2 RNA is generally detectable in upper and lower  respiratory specimens during the acute phase of infection. The lowest  concentration of SARS-CoV-2 viral copies this assay can detect is 250  copies / mL. A negative result does not preclude SARS-CoV-2 infection  and should not be used as the sole basis for treatment or other  patient management decisions.  A negative result may occur with  improper specimen collection / handling, submission of specimen other  than nasopharyngeal swab, presence of viral mutation(s) within the  areas targeted by this assay, and inadequate number of viral copies  (<250 copies / mL). A negative result must be combined with clinical  observations, patient history, and epidemiological information. If result is POSITIVE SARS-CoV-2 target nucleic acids are DETECTED. The SARS-CoV-2 RNA is generally detectable in upper and lower  respiratory specimens dur ing the acute phase of infection.  Positive  results are indicative of active infection with  SARS-CoV-2.  Clinical  correlation with patient history and other diagnostic information is  necessary to determine patient infection status.  Positive results do  not rule out bacterial infection or co-infection with other viruses. If result is PRESUMPTIVE POSTIVE SARS-CoV-2 nucleic acids MAY BE PRESENT.   A presumptive positive result was obtained on the submitted specimen  and confirmed on repeat testing.  While 2019 novel coronavirus  (SARS-CoV-2) nucleic acids may be present in the submitted sample  additional confirmatory testing may be necessary for epidemiological  and / or clinical management purposes  to differentiate between  SARS-CoV-2 and other Sarbecovirus currently known to infect humans.  If clinically  indicated additional testing with an alternate test  methodology [id number]) is advised. The SARS-CoV-2 RNA is generally  detectable in upper and lower respiratory sp ecimens during the acute  phase of infection. The expected result is Negative. Fact Sheet for Patients:  StrictlyIdeas.no Fact Sheet for Healthcare Providers: BankingDealers.co.za This test is not yet approved or cleared by the [place] FDA and has been authorized for detection and/or diagnosis of SARS-CoV-2 by FDA under an Emergency Use Authorization (EUA).  This EUA will remain in effect (meaning this test can be used) for the duration of the COVID-19 declaration under Section 564(b)(1) of the Act, 21 U.S.C. section 360bbb-3(b)(1), unless the authorization is terminated or revoked sooner. Performed at [facility], [place] [address], [place], [place] [address]   Urine culture     Status: None   Collection Time: 12/08/18  3:15 AM  Result Value Ref Range Status   Specimen Description   Final    URINE, RANDOM Performed at [facility], [address], [place], [place] [address]    Special Requests   Final    Immunocompromised Performed at  [facility], [address] [place], [place] [address]    Culture   Final    NO GROWTH Performed at [facility], [place] [address], [place], [place] [address]    Report Status 12/09/2018 FINAL  Final     Studies: Dg Chest Port 1 View  Result Date: 12/11/2018 CLINICAL DATA:  Shortness of breath. History of lung cancer. EXAM: PORTABLE CHEST 1 VIEW COMPARISON:  12/07/2018 FINDINGS: Right jugular Port-A-Cath terminates over the mid SVC. The cardiomediastinal silhouette is unchanged with persistent obscuration of the left heart border. Lung volumes remain low. Diffuse bilateral interstitial and patchy airspace opacities are again seen involving the left greater than right lungs with slight interval worsening including in the right lower lung. A left pleural effusion persists. No pneumothorax is identified. IMPRESSION: Slight worsening of bilateral airspace disease. Electronically Signed   By: Logan Bores M.D.   On: 12/11/2018 09:13    Scheduled Meds: . ALPRAZolam  0.5 mg Oral Q4H  . amLODipine  5 mg Oral Daily  . aspirin  81 mg Oral Daily  . atorvastatin  40 mg Oral Daily  . budesonide (PULMICORT) nebulizer solution  0.5 mg Nebulization BID  . chlorhexidine  15 mL Mouth Rinse BID  . enoxaparin (LOVENOX) injection  40 mg Subcutaneous Q24H  . hydroxychloroquine  200 mg Oral BID  . insulin aspart  0-9 Units Subcutaneous TID AC & HS  . insulin glargine  36 Units Subcutaneous q morning - 10a  . ipratropium-albuterol  3 mL Inhalation Q6H  . mouth rinse  15 mL Mouth Rinse q12n4p  . metoprolol tartrate  25 mg Oral BID  . oxyCODONE  20 mg Oral Q12H  . pantoprazole  40 mg Oral Daily  . predniSONE  60 mg Oral Q breakfast  . Ensure Max Protein  11 oz Oral BID BM   Continuous Infusions: . sodium chloride 500 mL (12/12/18 0901)  . ceFEPime (MAXIPIME) IV 2 g (12/12/18 0456)  . famotidine (PEPCID) IV Stopped (12/11/18 1445)  . fluconazole (DIFLUCAN) IV 100 mg (12/12/18 0902)     Assessment/Plan:  1. Acute on chronic hypoxic respiratory failure.  Patient on high flow nasal cannula.   No significant improvement continue prednisone prognosis overall poor, repeat chest x-ray shows worsening I will repeat a procalcitonin level is normal suggesting respiratory failure likely due  to worsening cancer 2. Clinical sepsis.  Hard to tell if this is healthcare associated pneumonia or not.  Continue cefepime  3. thrush.  IV Diflucan. 4. Stage IV cancer of the lung.  Any treatments at this point would be palliative in nature.  Dr. Tasia Catchings spoke with daughter Theadora Rama and recommended comfort care and hospice.  No significant improvement suspect patient will likely need to be comfort care 5. Rheumatoid arthritis on Plaquenil 6. Type 2 diabetes on glargine insulin and sliding scale.  I we will can increase her glargine again 7. Hyperkalemia.  Now resolved  8. hyperlipidemia unspecified on Lipitor 9. GERD on Protonix  Code Status:     Code Status Orders  (From admission, onward)         Start     Ordered   12/07/18 2346  Full code  Continuous     12/07/18 2345        Code Status History    Date Active Date Inactive Code Status Order ID Comments User Context   11/29/2018 2250 12/04/2018 1628 Full Code [id number]  Lance Coon, MD Inpatient   11/20/2018 2045 11/22/2018 1806 Full Code [id number]  Loletha Grayer, MD ED   10/26/2018 2037 10/28/2018 2050 Partial Code [id number]  Hillary Bow, MD Inpatient   10/26/2018 1728 10/26/2018 2037 DNR [id number]  Hillary Bow, MD ED   02/21/2018 2004 02/24/2018 1542 Full Code [id number]  Gladstone Lighter, MD ED   02/07/2018 2202 02/11/2018 1738 Full Code [id number]  Dustin Flock, MD Inpatient     Family Communication: Spoke with daughter Theadora Rama on the phone.  The patient has chosen her is the patient's primary contact. Disposition Plan: The patient will go to [name]'s house upon discharge  Antibiotics:  Cefepime  Diflucan  Time spent: 28  minutes  [place]

## 2018-12-12 NOTE — Progress Notes (Signed)
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 Patient was hospitalized 11/20/18 to 11/22/18 with hyperkalemia and AKI. She was admitted again 11/20/18 to 11/22/18 and 11/29/18 to 12/04/18 with PNA. She is now readmitted 12/08/18 with same. Patient has been seen in consultation with oncology who is concerned that respiratory failure reflects disease progression. Palliative care was consulted to help address goals.    CODE STATUS: DNR  PAST MEDICAL HISTORY: Past Medical History:  Diagnosis Date   Cancer ([place])    lung ca DX 2019   Collagen vascular disease ([place])    RA   DM2 (diabetes mellitus, type 2) ([place])    Dyspnea    Hyperlipemia    Hypertension    Osteomyelitis ([facility])    Pneumonia    RA (rheumatoid arthritis) ([place])    Rheumatoid arthritis ([place])     PAST SURGICAL HISTORY:  Past Surgical History:  Procedure Laterality Date   ENDOBRONCHIAL ULTRASOUND N/A 05/13/2018   Procedure: ENDOBRONCHIAL ULTRASOUND;  Surgeon: [name], MD;  Location: [facility]S;  Service: Pulmonary;  Laterality: N/A;   PORTA CATH INSERTION N/A 05/27/2018   Procedure: PORTA CATH INSERTION;  Surgeon: Katha Cabal, MD;  Location: [facility];  Service: Cardiovascular;  Laterality: N/A;   TOE SURGERY      HEMATOLOGY/ONCOLOGY HISTORY:    Malignant neoplasm of lung ([place])   05/25/2018 Initial Diagnosis    Malignant neoplasm of lung ([place])    05/31/2018 - 10/05/2018 Chemotherapy   The patient had palonosetron (ALOXI) injection 0.25 mg, 0.25 mg, Intravenous,  Once, 6 of 6 cycles Administration: 0.25 mg (05/31/2018), 0.25 mg (06/21/2018), 0.25 mg (07/12/2018), 0.25 mg (08/03/2018), 0.25 mg (08/25/2018), 0.25 mg (09/15/2018) pegfilgrastim-cbqv (UDENYCA) injection 6 mg, 6 mg, Subcutaneous, Once, 2 of 2 cycles Administration: 6 mg (06/02/2018) CARBOplatin (PARAPLATIN) 690 mg in sodium chloride 0.9 % 250 mL chemo infusion, 690 mg (100 % of original dose 693.6 mg), Intravenous,  Once, 6 of 6 cycles Dose modification:   (original dose 693.6 mg, Cycle 1) Administration: 690 mg (05/31/2018), 450 mg (06/21/2018), 540 mg (07/12/2018), 540 mg (08/03/2018), 450 mg (08/25/2018), 500 mg (09/15/2018) PACLitaxel (TAXOL) 342 mg in sodium chloride 0.9 % 500 mL chemo infusion (> '80mg'$ /m2), 175 mg/m2 = 342 mg (87.5 % of original dose 200 mg/m2), Intravenous,  Once, 6 of 6 cycles Dose modification: 175 mg/m2 (original dose 200 mg/m2, Cycle 1, Reason: Patient Age), 135 mg/m2 (original dose 200 mg/m2, Cycle 3, Reason: Other (see comments), Comment: neuropathy) Administration: 342 mg (05/31/2018), 342 mg (06/21/2018), 264 mg (07/12/2018), 264 mg (08/03/2018), 264 mg (08/25/2018), 264 mg (09/15/2018)  for chemotherapy treatment.     10/12/2018 -  Chemotherapy    The patient had palonosetron (ALOXI) injection 0.25 mg, 0.25 mg, Intravenous,  Once, 1 of 6 cycles Administration: 0.25 mg (10/12/2018) CARBOplatin (PARAPLATIN) 550 mg in sodium chloride 0.9 % 250 mL chemo infusion, 550 mg (100 % of original dose 545.5 mg), Intravenous,  Once, 1 of 6 cycles Dose modification:   (original dose 545.5 mg, Cycle 1) Administration: 550 mg (10/12/2018)  PACLitaxel (TAXOL) 276 mg in sodium chloride 0.9 % 250 mL chemo infusion (> '80mg'$ /m2), 135 mg/m2 = 276 mg (100 % of original dose 135 mg/m2), Intravenous,  Once, 1 of 6 cycles Dose modification: 135 mg/m2 (original dose 135 mg/m2, Cycle 1, Reason: Dose not tolerated) Administration:  276 mg (10/12/2018) pembrolizumab (KEYTRUDA) 200 mg in sodium chloride 0.9 % 50 mL chemo infusion, 200 mg, Intravenous, Once, 1 of 6 cycles Administration: 200 mg (10/12/2018) fosaprepitant (EMEND) 150 mg, dexamethasone (DECADRON) 12 mg in sodium chloride 0.9 % 145 mL IVPB, , Intravenous,  Once, 1 of 6 cycles Administration:  (10/12/2018)  for chemotherapy treatment.      ALLERGIES:  is allergic to ibuprofen and penicillins.  MEDICATIONS:  Current Facility-Administered Medications  Medication Dose Route Frequency Provider Last Rate Last Dose   0.9 %  sodium chloride infusion   Intravenous PRN Loletha Grayer, MD 10 mL/hr at 12/12/18 1607     acetaminophen (TYLENOL) tablet 650 mg  650 mg Oral Q6H PRN Lance Coon, MD   650 mg at 12/08/18 0133   Or   acetaminophen (TYLENOL) suppository 650 mg  650 mg Rectal Q6H PRN Lance Coon, MD       ALPRAZolam Duanne Moron) tablet 0.5 mg  0.5 mg Oral Q4H Dustin Flock, MD   0.5 mg at 12/12/18 0839   amLODipine (NORVASC) tablet 5 mg  5 mg Oral Daily Lance Coon, MD   5 mg at 12/12/18 1610   aspirin chewable tablet 81 mg  81 mg Oral Daily Lance Coon, MD   81 mg at 12/12/18 0850   atorvastatin (LIPITOR) tablet 40 mg  40 mg Oral Daily Lance Coon, MD   40 mg at 12/12/18 0849   budesonide (PULMICORT) nebulizer solution 0.5 mg  0.5 mg Nebulization BID Loletha Grayer, MD   0.5 mg at 12/12/18 0800   calcium carbonate (TUMS - dosed in mg elemental calcium) chewable tablet 200 mg of elemental calcium  1 tablet Oral TID PRN Loletha Grayer, MD       ceFEPIme (MAXIPIME) 2 g in sodium chloride 0.9 % 100 mL IVPB  2 g Intravenous Q8H Wieting, Richard, MD 200 mL/hr at 12/12/18 1609 2 g at 12/12/18 1609   chlorhexidine (PERIDEX) 0.12 % solution 15 mL  15 mL Mouth Rinse BID Lance Coon, MD   15 mL at 12/12/18 0851   enoxaparin (LOVENOX) injection 40 mg  40 mg Subcutaneous Q24H Lance Coon, MD   40 mg at 12/11/18 2146   fluconazole (DIFLUCAN) IVPB  100 mg  100 mg Intravenous q morning - 10a Loletha Grayer, MD 50 mL/hr at 12/12/18 0902 100 mg at 12/12/18 0902   hydroxychloroquine (PLAQUENIL) tablet 200 mg  200 mg Oral BID Lance Coon, MD   200 mg at 12/12/18 0849   insulin aspart (novoLOG) injection 0-9 Units  0-9 Units Subcutaneous TID AC & HS Lance Coon, MD   1 Units at 12/12/18 1231   insulin glargine (LANTUS) injection 36 Units  36 Units Subcutaneous q morning - 10a Dustin Flock, MD   36 Units at 12/12/18 0849   ipratropium-albuterol (DUONEB) 0.5-2.5 (3) MG/3ML nebulizer solution 3 mL  3 mL Inhalation Q6H Loletha Grayer, MD   3 mL at 12/12/18 1320   MEDLINE mouth rinse  15 mL Mouth Rinse q12n4p Lance Coon, MD   15 mL at 12/12/18 1610   metoprolol tartrate (LOPRESSOR) tablet 25 mg  25 mg Oral BID Lance Coon, MD   25 mg at 12/12/18 [phone]  morphine 2 MG/ML injection 1 mg  1 mg Intravenous Q4H PRN Dustin Flock, MD   1 mg at 12/11/18 0606   ondansetron (ZOFRAN) tablet 4 mg  4 mg Oral Q6H PRN Lance Coon, MD       Or   ondansetron [facility]) injection 4 mg  4 mg Intravenous Q6H PRN Lance Coon, MD       oxyCODONE (Oxy IR/ROXICODONE) immediate release tablet 10 mg  10 mg Oral Q6H PRN Lance Coon, MD   10 mg at 12/08/18 1702   oxyCODONE (OXYCONTIN) 12 hr tablet 20 mg  20 mg Oral Laurence Spates, MD   20 mg at 12/12/18 0905   pantoprazole (PROTONIX) EC tablet 40 mg  40 mg Oral Daily Lance Coon, MD   40 mg at 12/12/18 0849   predniSONE (DELTASONE) tablet 60 mg  60 mg Oral Q breakfast Cammie Sickle, MD   60 mg at 12/12/18 8657   protein supplement (ENSURE MAX) liquid  11 oz Oral BID BM Loletha Grayer, MD   11 oz at 12/12/18 1233    VITAL SIGNS: BP (!) 102/56 (BP Location: Right Arm)    Pulse 66    Temp 97.9 F (36.6 C) (Oral)    Resp 20    Ht '5\' 6"'$  (1.676 m)    Wt 180 lb (81.6 kg)    SpO2 96%    BMI 29.05 kg/m  Filed Weights   12/07/18 1359  Weight: 180 lb (81.6 kg)    Estimated body mass  index is 29.05 kg/m as calculated from the following:   Height as of this encounter: '5\' 6"'$  (1.676 m).   Weight as of this encounter: 180 lb (81.6 kg).  LABS: CBC:    Component Value Date/Time   WBC 18.3 (H) 12/09/2018 0320   HGB 8.0 (L) 12/09/2018 0320   HGB 11.2 (L) 02/12/2014 0347   HCT 27.3 (L) 12/09/2018 0320   HCT 33.7 (L) 02/12/2014 0347   PLT 386 12/09/2018 0320   PLT 404 02/12/2014 0347   MCV 99.3 12/09/2018 0320   MCV 91 02/12/2014 0347   NEUTROABS 18.9 (H) 12/07/2018 1354   NEUTROABS 5.7 02/12/2014 0347   LYMPHSABS 0.7 12/07/2018 1354   LYMPHSABS 2.9 02/12/2014 0347   MONOABS 1.7 (H) 12/07/2018 1354   MONOABS 1.2 (H) 02/12/2014 0347   EOSABS 0.1 12/07/2018 1354   EOSABS 0.2 02/12/2014 0347   BASOSABS 0.1 12/07/2018 1354   BASOSABS 0.1 02/12/2014 0347   Comprehensive Metabolic Panel:    Component Value Date/Time   NA 136 12/10/2018 0552   NA 133 (L) 02/09/2014 0526   K 4.8 12/10/2018 0552   K 4.2 02/09/2014 0526   CL 108 12/10/2018 0552   CL 102 02/09/2014 0526   CO2 23 12/10/2018 0552   CO2 26 02/09/2014 0526   BUN 46 (H) 12/10/2018 0552   BUN 11 02/09/2014 0526   CREATININE 0.97 12/10/2018 0552   CREATININE 0.78 02/09/2014 0526   GLUCOSE 388 (H) 12/10/2018 0552   GLUCOSE 220 (H) 02/09/2014 0526   CALCIUM 8.7 (L) 12/10/2018 0552   CALCIUM 8.4 (L) 02/09/2014 0526   AST 30 12/08/2018 0545   AST 16 02/08/2014 1034   ALT 16 12/08/2018 0545   ALT 12 02/08/2014 1034   ALKPHOS 83 12/08/2018 0545   ALKPHOS 113 02/08/2014 1034   BILITOT 0.5 12/08/2018 0545   BILITOT 0.3 02/08/2014 1034   PROT 7.9 12/08/2018 0545   PROT 7.6 02/08/2014 1034  ALBUMIN 2.1 (L) 12/08/2018 0545   ALBUMIN 2.9 (L) 02/08/2014 1034    RADIOGRAPHIC STUDIES: Dg Chest 1 View  Result Date: 12/03/2018 CLINICAL DATA:  Fever. History of lung cancer in 2019. History of pneumonia, diabetes, former smoker. EXAM: CHEST  1 VIEW COMPARISON:  Chest x-rays dated 11/29/2018 and 10/26/2018.  Chest CT dated 11/29/2018. FINDINGS: Heart size and mediastinal contours appear stable. RIGHT chest wall Port-A-Cath appears stable in position with tip at the level of the mid SVC. Persistent near complete opacification of the LEFT hemithorax, with small residual aeration at the LEFT lung apex. Increased interstitial markings within the RIGHT lung suggesting developing edema. No pneumothorax seen. Osseous structures about the chest are unremarkable. IMPRESSION: Stable chest x-ray. Persistent near complete opacification of the LEFT hemithorax, compatible with the combination of cavitary LEFT upper lobe pulmonary lesion (known lung cancer) and the progressive interstitial and ill-defined airspace disease in the LEFT lower lobe better demonstrated on chest CT of 11/29/2018 with differential of metastatic progression versus superimposed pneumonia. Electronically Signed   By: Franki Cabot M.D.   On: 12/03/2018 13:55   Dg Chest 2 View  Result Date: 11/20/2018 CLINICAL DATA:  Shortness of breath.  Left lower extremity edema. EXAM: CHEST - 2 VIEW COMPARISON:  November 07, 2018 chest x-ray and chest CT FINDINGS: A right Port-A-Cath is stable. Infiltrate in the left lung has worsened in the interval. No other interval changes. IMPRESSION: Worsening infiltrate in the left lung suggesting the possibility of worsening pneumonia. Electronically Signed   By: Dorise Bullion III M.D   On: 11/20/2018 17:32   Ct Chest W Contrast  Result Date: 11/29/2018 CLINICAL DATA:  Shortness of breath and increasing oxygen requirements. EXAM: CT CHEST WITH CONTRAST TECHNIQUE: Multidetector CT imaging of the chest was performed during intravenous contrast administration. CONTRAST:  62m OMNIPAQUE IOHEXOL 300 MG/ML  SOLN COMPARISON:  11/07/2018 FINDINGS: Cardiovascular: The heart size is normal. No substantial pericardial effusion. Coronary artery calcification is evident. Atherosclerotic calcification is noted in the wall of the thoracic  aorta. Right Port-A-Cath tip is positioned in the mid SVC. Main pulmonary arteries are enlarged. Mediastinum/Nodes: 8 mm short axis subcarinal lymph node evident. Precarinal lymph node measures up to 11 mm short axis. Multiple small nodes are seen in the prevascular space. There is no axillary lymphadenopathy. Lungs/Pleura: The central tracheobronchial airways are patent. Centrilobular and paraseptal emphysema evident. volume loss left hemithorax is similar to prior. Continued progression of interstitial and airspace disease in the left upper lobe surrounding the cavitary mass in this patient with known lung cancer.Interval progression of interstitial and irregular, ill-defined airspace disease in the left base with peripheral predominance. Areas of subpleural reticulation in the right lung are similar to prior. Upper Abdomen: Unremarkable Musculoskeletal: No worrisome lytic or sclerotic osseous abnormality. Old left rib fracture evident. IMPRESSION: 1. Volume loss left hemithorax with similar appearance of cavitary left upper lobe pulmonary lesion in this patient with known lung cancer. Since 11/07/2018, there is been progression of interstitial and ill-defined airspace disease in the left lower lobe which may represent metastatic progression or infectious etiology. 2. Borderline to mild mediastinal lymphadenopathy. Metastatic disease a concern. 3. Enlargement of the main pulmonary arteries compatible with pulmonary arterial hypertension. 4.  Aortic Atherosclerois (ICD10-170.0) 5.  Emphysema. (([id number]) Electronically Signed   By: EMisty StanleyM.D.   On: 11/29/2018 18:55   Dg Chest Port 1 View  Result Date: 12/11/2018 CLINICAL DATA:  Shortness of breath. History of lung cancer. EXAM: PORTABLE  CHEST 1 VIEW COMPARISON:  12/07/2018 FINDINGS: Right jugular Port-A-Cath terminates over the mid SVC. The cardiomediastinal silhouette is unchanged with persistent obscuration of the left heart border. Lung volumes  remain low. Diffuse bilateral interstitial and patchy airspace opacities are again seen involving the left greater than right lungs with slight interval worsening including in the right lower lung. A left pleural effusion persists. No pneumothorax is identified. IMPRESSION: Slight worsening of bilateral airspace disease. Electronically Signed   By: Logan Bores M.D.   On: 12/11/2018 09:13   Dg Chest Port 1 View  Result Date: 12/07/2018 CLINICAL DATA:  Fever and respiratory distress EXAM: PORTABLE CHEST 1 VIEW COMPARISON:  12/03/2018 FINDINGS: Slightly decreased lung volumes noted with stable to slight increase in bilateral interstitial/airspace opacities. LEFT pleural effusion is again noted with LEFT hemithorax volume loss. A RIGHT IJ Port-A-Cath is again identified with tip overlying the mid SVC. No pneumothorax. IMPRESSION: Decreased lung volumes with stable to slight increase in diffuse bilateral interstitial/airspace opacities. No other significant change. Electronically Signed   By: Margarette Canada M.D.   On: 12/07/2018 15:16   Dg Chest Port 1 View  Result Date: 11/29/2018 CLINICAL DATA:  Shortness of breath.  Exposure to COVID-19. EXAM: PORTABLE CHEST 1 VIEW COMPARISON:  Chest x-rays dated 11/20/2018 and 11/07/2018 and chest CTs dated 11/07/2018 and 10/04/2018 FINDINGS: There is a persistent extensive infiltrate in the left lung superimposed on chronic changes including a cavitary lesion in the left midzone laterally. Overall heart size is normal. Aortic atherosclerosis. Power port in good position. Right lung is clear. No acute bone abnormality. IMPRESSION: No change in the appearance of the chest since the prior study of 11/20/2018. Persistent extensive infiltrate on the left superimposed on chronic changes demonstrated on the prior CT scans. Aortic Atherosclerosis (ICD10-I70.0). Electronically Signed   By: Lorriane Shire M.D.   On: 11/29/2018 16:06    PERFORMANCE STATUS (ECOG) : 3 - Symptomatic, >50%  confined to bed  Review of Systems Unless otherwise noted, a complete review of systems is negative.  Physical Exam General: NAD, frail appearing, thin Pulmonary: on O2, somewhat labored with speaking Extremities: no edema Skin: no rashes Neurological: Weakness but otherwise nonfocal  IMPRESSION: Follow up visit made today.  Patient is now on HFNC at 40L/60%. She has been lethargic at time. Patient is currently awake and eating dinner. She is spending time with daughters, both of whom are at bedside.   I met privately with both daughters. Both recognize that patient has not improved significantly and is at risk of further decline. We talked about the option of comfort care in the setting of decline. Family would still like to try to get her home with hospice if possible.   Case and plan discussed with Dr. Tasia Catchings.  PLAN: -Continue current scope of treatment -DNR -Agree with morphine prn for pain/dyspnea -alprazolam 0.'5mg'$  TID prn for anxiety -Will follow  Time Total: 30 minutes  Visit consisted of counseling and education dealing with the complex and emotionally intense issues of symptom management and palliative care in the setting of serious and potentially life-threatening illness.Greater than 50%  of this time was spent counseling and coordinating care related to the above assessment and plan.  Signed by: Altha Harm, PhD, NP-C [phone] (Work Cell)

## 2018-12-12 NOTE — Progress Notes (Signed)
Hematology/Oncology Progress Note [facility] [phone] [phone]  Patient Care Team: Glendon Axe, MD as PCP - General (Internal Medicine) Telford Nab, RN as Registered Nurse   Name of the patient: Tamara Parrish  [id number]  1955-09-08  Date of visit: 12/12/18   INTERVAL HISTORY-  Continues to be hypoxic, on high flow oxygen Lethargic   Review of systems- Review of Systems  Unable to perform ROS: Mental status change    Allergies  Allergen Reactions   Ibuprofen Other (See Comments)    Is not supposed to take because of her kidneys.   Penicillins Other (See Comments)    "welts" as a child (unknown age) Has patient had a PCN reaction causing immediate rash, facial/tongue/throat swelling, SOB or lightheadedness with hypotension: No Has patient had a PCN reaction causing severe rash involving mucus membranes or skin necrosis: No Has patient had a PCN reaction that required hospitalization: No Has patient had a PCN reaction occurring within the last 10 years: No If all of the above answers are "NO", then may proceed with Cephalosporin use.    Patient Active Problem List   Diagnosis Date Noted   Pressure injury of skin 12/09/2018   Cancer of upper lobe of left lung ([place]) 12/08/2018   Sepsis ([place]) 11/29/2018   Suspected Covid-19 Virus Infection 11/29/2018   Palliative care encounter    HCAP (healthcare-associated pneumonia) 11/20/2018   Dehydration 11/02/2018   Anemia 10/26/2018   Encounter for antineoplastic chemotherapy 07/12/2018   Rheumatoid arthritis ([place]) 07/12/2018   Thrombocytosis ([place]) 07/12/2018   Goals of care, counseling/discussion 05/25/2018   Malignant neoplasm of lung ([place]) 05/25/2018   Recurrent pneumonia 02/21/2018   PNA (pneumonia) 02/07/2018   Elevated erythrocyte sedimentation rate 12/16/2017   Dry cough 08/19/2017   Rheumatoid nodule of multiple sites ([place]) 08/19/2017    Polyneuropathy associated with underlying disease ([place]) 05/17/2017   Seasonal allergic rhinitis due to pollen 05/17/2017   Essential hypertension 10/09/2016   Pure hypercholesterolemia 10/09/2016   Rheumatoid arthritis, seropositive ([place]) 03/27/2016   Polyarthralgia 03/18/2016   Diabetes mellitus type 2, uncomplicated ([place]) 21/22/4825   Allergic rhinitis 05/18/2014   Microalbuminuria 04/18/2014     Past Medical History:  Diagnosis Date   Cancer ([place])    lung ca DX 2019   Collagen vascular disease (HCC)    RA   DM2 (diabetes mellitus, type 2) ([place])    Dyspnea    Hyperlipemia    Hypertension    Osteomyelitis (HCC)    Pneumonia    RA (rheumatoid arthritis) ([place])    Rheumatoid arthritis ([place])      Past Surgical History:  Procedure Laterality Date   ENDOBRONCHIAL ULTRASOUND N/A 05/13/2018   Procedure: ENDOBRONCHIAL ULTRASOUND;  Surgeon: [name], MD;  Location: [facility];  Service: Pulmonary;  Laterality: N/A;   PORTA CATH INSERTION N/A 05/27/2018   Procedure: PORTA CATH INSERTION;  Surgeon: Katha Cabal, MD;  Location: [facility];  Service: Cardiovascular;  Laterality: N/A;   TOE SURGERY      Social History   Socioeconomic History   Marital status: Single    Spouse name: Not on file   Number of children: 2   Years of education: Not on file   Highest education level: Not on file  Occupational History   Not on file  Social Needs   Financial resource strain: Not very hard   Food insecurity:    Worry: Never true    Inability: Never true  Transportation needs:    Medical: No    Non-medical: No  Tobacco Use   Smoking status: Former Smoker    Packs/day: 1.00    Years: 40.00    Pack years: 40.00    Types: Cigarettes    Last attempt to quit: 11/22/2017    Years since quitting: 1.0   Smokeless tobacco: Never Used   Tobacco comment: quit 2 weeks ago  Substance and Sexual Activity   Alcohol use: Never     Frequency: Never   Drug use: Never   Sexual activity: Not Currently  Lifestyle   Physical activity:    Days per week: 0 days    Minutes per session: Not on file   Stress: To some extent  Relationships   Social connections:    Talks on phone: Three times a week    Gets together: More than three times a week    Attends religious service: Not on file    Active member of club or organization: No    Attends meetings of clubs or organizations: Never    Relationship status: Never married   Intimate partner violence:    Fear of current or ex partner: No    Emotionally abused: No    Physically abused: No    Forced sexual activity: No  Other Topics Concern   Not on file  Social History Narrative   Living at home with daughter.  Ambulates with a walker at baseline.     Family History  Problem Relation Age of Onset   Diabetes Mother    Lung cancer Father      Current Facility-Administered Medications:    0.9 %  sodium chloride infusion, , Intravenous, PRN, Loletha Grayer, MD, Last Rate: 10 mL/hr at 12/12/18 1607   acetaminophen (TYLENOL) tablet 650 mg, 650 mg, Oral, Q6H PRN, 650 mg at 12/08/18 0133 **OR** acetaminophen (TYLENOL) suppository 650 mg, 650 mg, Rectal, Q6H PRN, Lance Coon, MD   ALPRAZolam Duanne Moron) tablet 0.5 mg, 0.5 mg, Oral, Q4H, Dustin Flock, MD, 0.5 mg at 12/12/18 0839   amLODipine (NORVASC) tablet 5 mg, 5 mg, Oral, Daily, Lance Coon, MD, 5 mg at 12/12/18 9678   aspirin chewable tablet 81 mg, 81 mg, Oral, Daily, Lance Coon, MD, 81 mg at 12/12/18 0850   atorvastatin (LIPITOR) tablet 40 mg, 40 mg, Oral, Daily, Lance Coon, MD, 40 mg at 12/12/18 0849   budesonide (PULMICORT) nebulizer solution 0.5 mg, 0.5 mg, Nebulization, BID, Leslye Peer, Richard, MD, 0.5 mg at 12/12/18 0800   calcium carbonate (TUMS - dosed in mg elemental calcium) chewable tablet 200 mg of elemental calcium, 1 tablet, Oral, TID PRN, Leslye Peer, Richard, MD   ceFEPIme (MAXIPIME)  2 g in sodium chloride 0.9 % 100 mL IVPB, 2 g, Intravenous, Q8H, Wieting, Richard, MD, Last Rate: 200 mL/hr at 12/12/18 1609, 2 g at 12/12/18 1609   chlorhexidine (PERIDEX) 0.12 % solution 15 mL, 15 mL, Mouth Rinse, BID, Lance Coon, MD, 15 mL at 12/12/18 0851   enoxaparin (LOVENOX) injection 40 mg, 40 mg, Subcutaneous, Q24H, Lance Coon, MD, 40 mg at 12/11/18 2146   fluconazole (DIFLUCAN) IVPB 100 mg, 100 mg, Intravenous, q morning - 10a, Wieting, Richard, MD, Last Rate: 50 mL/hr at 12/12/18 0902, 100 mg at 12/12/18 9381   hydroxychloroquine (PLAQUENIL) tablet 200 mg, 200 mg, Oral, BID, Lance Coon, MD, 200 mg at 12/12/18 0849   insulin aspart (novoLOG) injection 0-9 Units, 0-9 Units, Subcutaneous, TID AC & HS, Lance Coon, MD, 1 Units at  12/12/18 1231   insulin glargine (LANTUS) injection 36 Units, 36 Units, Subcutaneous, q morning - 10a, Dustin Flock, MD, 36 Units at 12/12/18 0849   ipratropium-albuterol (DUONEB) 0.5-2.5 (3) MG/3ML nebulizer solution 3 mL, 3 mL, Inhalation, Q6H, Wieting, Richard, MD, 3 mL at 12/12/18 1320   MEDLINE mouth rinse, 15 mL, Mouth Rinse, q12n4p, Lance Coon, MD, 15 mL at 12/12/18 1610   metoprolol tartrate (LOPRESSOR) tablet 25 mg, 25 mg, Oral, BID, Lance Coon, MD, 25 mg at 12/12/18 0849   morphine 2 MG/ML injection 1 mg, 1 mg, Intravenous, Q4H PRN, Dustin Flock, MD, 1 mg at 12/11/18 0606   ondansetron (ZOFRAN) tablet 4 mg, 4 mg, Oral, Q6H PRN **OR** ondansetron (ZOFRAN) injection 4 mg, 4 mg, Intravenous, Q6H PRN, Lance Coon, MD   oxyCODONE (Oxy IR/ROXICODONE) immediate release tablet 10 mg, 10 mg, Oral, Q6H PRN, Lance Coon, MD, 10 mg at 12/08/18 1702   oxyCODONE (OXYCONTIN) 12 hr tablet 20 mg, 20 mg, Oral, Q12H, Lance Coon, MD, 20 mg at 12/12/18 0905   pantoprazole (PROTONIX) EC tablet 40 mg, 40 mg, Oral, Daily, Lance Coon, MD, 40 mg at 12/12/18 0849   predniSONE (DELTASONE) tablet 60 mg, 60 mg, Oral, Q breakfast, Charlaine Dalton R, MD, 60 mg at 12/12/18 6237   protein supplement (ENSURE MAX) liquid, 11 oz, Oral, BID BM, Loletha Grayer, MD, 11 oz at 12/12/18 1233   Physical exam:  Vitals:   12/12/18 0800 12/12/18 0846 12/12/18 1320 12/12/18 1539  BP:  128/64  (!) 102/56  Pulse:  80  66  Resp:    20  Temp:  97.8 F (36.6 C)  97.9 F (36.6 C)  TempSrc:  Oral  Oral  SpO2: 98% 97% 97% 96%  Weight:      Height:       Physical Exam  HENT:  Head: Normocephalic.  Neck: Normal range of motion.  Cardiovascular:  No murmur heard. Pulmonary/Chest: Effort normal.  On high flow oxygen,  Abdominal: Soft. She exhibits no distension.  Musculoskeletal:        General: Edema present.  Neurological:  Lethargic  Skin: Skin is warm.       CMP Latest Ref Rng & Units 12/10/2018  Glucose 70 - 99 mg/dL 388(H)  BUN 8 - 23 mg/dL 46(H)  Creatinine 0.44 - 1.00 mg/dL 0.97  Sodium 135 - 145 mmol/L 136  Potassium 3.5 - 5.1 mmol/L 4.8  Chloride 98 - 111 mmol/L 108  CO2 22 - 32 mmol/L 23  Calcium 8.9 - 10.3 mg/dL 8.7(L)  Total Protein 6.5 - 8.1 g/dL -  Total Bilirubin 0.3 - 1.2 mg/dL -  Alkaline Phos 38 - 126 U/L -  AST 15 - 41 U/L -  ALT 0 - 44 U/L -   CBC Latest Ref Rng & Units 12/09/2018  WBC 4.0 - 10.5 K/uL 18.3(H)  Hemoglobin 12.0 - 15.0 g/dL 8.0(L)  Hematocrit 36.0 - 46.0 % 27.3(L)  Platelets 150 - 400 K/uL 386   RADIOGRAPHIC STUDIES: I have personally reviewed the radiological images as listed and agreed with the findings in the report. Dg Chest 1 View  Result Date: 12/03/2018 CLINICAL DATA:  Fever. History of lung cancer in 2019. History of pneumonia, diabetes, former smoker. EXAM: CHEST  1 VIEW COMPARISON:  Chest x-rays dated 11/29/2018 and 10/26/2018. Chest CT dated 11/29/2018. FINDINGS: Heart size and mediastinal contours appear stable. RIGHT chest wall Port-A-Cath appears stable in position with tip at the level of the mid SVC. Persistent near  complete opacification of the LEFT hemithorax, with  small residual aeration at the LEFT lung apex. Increased interstitial markings within the RIGHT lung suggesting developing edema. No pneumothorax seen. Osseous structures about the chest are unremarkable. IMPRESSION: Stable chest x-ray. Persistent near complete opacification of the LEFT hemithorax, compatible with the combination of cavitary LEFT upper lobe pulmonary lesion (known lung cancer) and the progressive interstitial and ill-defined airspace disease in the LEFT lower lobe better demonstrated on chest CT of 11/29/2018 with differential of metastatic progression versus superimposed pneumonia. Electronically Signed   By: Franki Cabot M.D.   On: 12/03/2018 13:55   Dg Chest 2 View  Result Date: 11/20/2018 CLINICAL DATA:  Shortness of breath.  Left lower extremity edema. EXAM: CHEST - 2 VIEW COMPARISON:  November 07, 2018 chest x-ray and chest CT FINDINGS: A right Port-A-Cath is stable. Infiltrate in the left lung has worsened in the interval. No other interval changes. IMPRESSION: Worsening infiltrate in the left lung suggesting the possibility of worsening pneumonia. Electronically Signed   By: Dorise Bullion III M.D   On: 11/20/2018 17:32   Ct Chest W Contrast  Result Date: 11/29/2018 CLINICAL DATA:  Shortness of breath and increasing oxygen requirements. EXAM: CT CHEST WITH CONTRAST TECHNIQUE: Multidetector CT imaging of the chest was performed during intravenous contrast administration. CONTRAST:  82mL OMNIPAQUE IOHEXOL 300 MG/ML  SOLN COMPARISON:  11/07/2018 FINDINGS: Cardiovascular: The heart size is normal. No substantial pericardial effusion. Coronary artery calcification is evident. Atherosclerotic calcification is noted in the wall of the thoracic aorta. Right Port-A-Cath tip is positioned in the mid SVC. Main pulmonary arteries are enlarged. Mediastinum/Nodes: 8 mm short axis subcarinal lymph node evident. Precarinal lymph node measures up to 11 mm short axis. Multiple small nodes are seen in  the prevascular space. There is no axillary lymphadenopathy. Lungs/Pleura: The central tracheobronchial airways are patent. Centrilobular and paraseptal emphysema evident. volume loss left hemithorax is similar to prior. Continued progression of interstitial and airspace disease in the left upper lobe surrounding the cavitary mass in this patient with known lung cancer.Interval progression of interstitial and irregular, ill-defined airspace disease in the left base with peripheral predominance. Areas of subpleural reticulation in the right lung are similar to prior. Upper Abdomen: Unremarkable Musculoskeletal: No worrisome lytic or sclerotic osseous abnormality. Old left rib fracture evident. IMPRESSION: 1. Volume loss left hemithorax with similar appearance of cavitary left upper lobe pulmonary lesion in this patient with known lung cancer. Since 11/07/2018, there is been progression of interstitial and ill-defined airspace disease in the left lower lobe which may represent metastatic progression or infectious etiology. 2. Borderline to mild mediastinal lymphadenopathy. Metastatic disease a concern. 3. Enlargement of the main pulmonary arteries compatible with pulmonary arterial hypertension. 4.  Aortic Atherosclerois (ICD10-170.0) 5.  Emphysema. (IOE70-J50.9) Electronically Signed   By: Misty Stanley M.D.   On: 11/29/2018 18:55   Dg Chest Port 1 View  Result Date: 12/11/2018 CLINICAL DATA:  Shortness of breath. History of lung cancer. EXAM: PORTABLE CHEST 1 VIEW COMPARISON:  12/07/2018 FINDINGS: Right jugular Port-A-Cath terminates over the mid SVC. The cardiomediastinal silhouette is unchanged with persistent obscuration of the left heart border. Lung volumes remain low. Diffuse bilateral interstitial and patchy airspace opacities are again seen involving the left greater than right lungs with slight interval worsening including in the right lower lung. A left pleural effusion persists. No pneumothorax is  identified. IMPRESSION: Slight worsening of bilateral airspace disease. Electronically Signed   By: Seymour Bars.D.  On: 12/11/2018 09:13   Dg Chest Port 1 View  Result Date: 12/07/2018 CLINICAL DATA:  Fever and respiratory distress EXAM: PORTABLE CHEST 1 VIEW COMPARISON:  12/03/2018 FINDINGS: Slightly decreased lung volumes noted with stable to slight increase in bilateral interstitial/airspace opacities. LEFT pleural effusion is again noted with LEFT hemithorax volume loss. A RIGHT IJ Port-A-Cath is again identified with tip overlying the mid SVC. No pneumothorax. IMPRESSION: Decreased lung volumes with stable to slight increase in diffuse bilateral interstitial/airspace opacities. No other significant change. Electronically Signed   By: Margarette Canada M.D.   On: 12/07/2018 15:16   Dg Chest Port 1 View  Result Date: 11/29/2018 CLINICAL DATA:  Shortness of breath.  Exposure to COVID-19. EXAM: PORTABLE CHEST 1 VIEW COMPARISON:  Chest x-rays dated 11/20/2018 and 11/07/2018 and chest CTs dated 11/07/2018 and 10/04/2018 FINDINGS: There is a persistent extensive infiltrate in the left lung superimposed on chronic changes including a cavitary lesion in the left midzone laterally. Overall heart size is normal. Aortic atherosclerosis. Power port in good position. Right lung is clear. No acute bone abnormality. IMPRESSION: No change in the appearance of the chest since the prior study of 11/20/2018. Persistent extensive infiltrate on the left superimposed on chronic changes demonstrated on the prior CT scans. Aortic Atherosclerosis (ICD10-I70.0). Electronically Signed   By: Lorriane Shire M.D.   On: 11/29/2018 16:06    Assessment and plan-  Patient is a 63 y.o. female with history of stage IV squamous cell lung cancer currently admitted due to acute on chronic hypoxic respiratory failure.  #Acute on chronic respiratory failure, patient has no significant improvement, continues to have high oxygen demand. Not  responding to prednisone, less likely immunotherapy related pneumonitis.  Last immunotherapy back in February 2020. Recent CT and x-rays concerning for lymphangitic  spread of known squamous cell lung cancer. She has been on empiric antibiotics since admission with no improvement.  COVID-19 negative x2 Pre-calcitonin 0.11, less likely infectious Prognosis is poor, not chemotherapy candidate due to multiple comorbidities, declining of overall performance status. Concerning of lymphangitic spread of known squamous cell carcinoma of lung, contributing to worsening of chronic respiratory failure.  Recommend comfort care/hospice. Discussed with patient's daughter Theadora Rama who is very upset as she could not be at the bedside to be with her mom.  She was very emotional and request patient to be discharged to her home with home hospice. I communicated with Dr. Posey Pronto who has discussed with home hospice.  Home hospice cannot be done at this point due to patient's extremely high oxygen demand.  Explained to daughter.  Dr. Posey Pronto communicated with charge nurse and daughter Theadora Rama can meet RN Megan at the medical mall entrance and be able to come to visit her mother.  Daughter was notified.  Thank you for allowing me to participate in the care of this patient.  We spent sufficient time to discuss many aspect of care, questions were answered to patient's satisfaction. Total face to face encounter time for this patient visit was 25 min. >50% of the time was  spent in counseling and coordination of care.    Earlie Server, MD, PhD Hematology Oncology [facility] at [facility] Pager- [phone] 12/12/2018

## 2018-12-12 NOTE — Progress Notes (Addendum)
Daughter called updated her on pts current status, aware that pt remains on HFNC, labored breathing, dyspnea at rest and with exertion, pt voices needs, resting between care, daughter states understanding

## 2018-12-13 LAB — GLUCOSE, CAPILLARY
Glucose-Capillary: 127 mg/dL — ABNORMAL HIGH (ref 70–99)
Glucose-Capillary: 113 mg/dL — ABNORMAL HIGH (ref 70–99)
Glucose-Capillary: 254 mg/dL — ABNORMAL HIGH (ref 70–99)
Glucose-Capillary: 308 mg/dL — ABNORMAL HIGH (ref 70–99)

## 2018-12-13 NOTE — Progress Notes (Signed)
Patient in tripod position breathing is labored  shallow. RR 35. Notified RT requested to come to bedside. RN administered Albuterol. Treatments provided some relief. Pt laying in bed no signs of distress. Will continue to monitor.

## 2018-12-13 NOTE — Progress Notes (Signed)
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 Patient was hospitalized 11/20/18 to 11/22/18 with hyperkalemia and AKI. She was admitted again 11/20/18 to 11/22/18 and 11/29/18 to 12/04/18 with PNA. She is now readmitted 12/08/18 with same. Patient has been seen in consultation with oncology who is concerned that respiratory failure reflects disease progression. Palliative care was consulted to help address goals.    CODE STATUS: DNR  PAST MEDICAL HISTORY: Past Medical History:  Diagnosis Date   Cancer ([place])    lung ca DX 2019   Collagen vascular disease ([place])    RA   DM2 (diabetes mellitus, type 2) ([place])    Dyspnea    Hyperlipemia    Hypertension    Osteomyelitis ([facility])    Pneumonia    RA (rheumatoid arthritis) ([place])    Rheumatoid arthritis ([place])     PAST SURGICAL HISTORY:  Past Surgical History:  Procedure Laterality Date   ENDOBRONCHIAL ULTRASOUND N/A 05/13/2018   Procedure: ENDOBRONCHIAL ULTRASOUND;  Surgeon: [name], MD;  Location: [facility] ORS;  Service: Pulmonary;  Laterality: N/A;   PORTA CATH INSERTION N/A 05/27/2018   Procedure: PORTA CATH INSERTION;  Surgeon: Katha Cabal, MD;  Location: [facility];  Service: Cardiovascular;  Laterality: N/A;   TOE SURGERY      HEMATOLOGY/ONCOLOGY HISTORY:    Malignant neoplasm of lung ([place])   05/25/2018 Initial Diagnosis    Malignant neoplasm of lung ([place])    05/31/2018 - 10/05/2018 Chemotherapy   The patient had palonosetron (ALOXI) injection 0.25 mg, 0.25 mg, Intravenous,  Once, 6 of 6 cycles Administration: 0.25 mg (05/31/2018), 0.25 mg (06/21/2018), 0.25 mg (07/12/2018), 0.25 mg (08/03/2018), 0.25 mg (08/25/2018), 0.25 mg (09/15/2018) pegfilgrastim-cbqv (UDENYCA) injection 6 mg, 6 mg, Subcutaneous, Once, 2 of 2 cycles Administration: 6 mg (06/02/2018) CARBOplatin (PARAPLATIN) 690 mg in sodium chloride 0.9 % 250 mL chemo infusion, 690 mg (100 % of original dose 693.6 mg), Intravenous,  Once, 6 of 6 cycles Dose modification:   (original dose 693.6 mg, Cycle 1) Administration: 690 mg (05/31/2018), 450 mg (06/21/2018), 540 mg (07/12/2018), 540 mg (08/03/2018), 450 mg (08/25/2018), 500 mg (09/15/2018) PACLitaxel (TAXOL) 342 mg in sodium chloride 0.9 % 500 mL chemo infusion (> 80mg /m2), 175 mg/m2 = 342 mg (87.5 % of original dose 200 mg/m2), Intravenous,  Once, 6 of 6 cycles Dose modification: 175 mg/m2 (original dose 200 mg/m2, Cycle 1, Reason: Patient Age), 135 mg/m2 (original dose 200 mg/m2, Cycle 3, Reason: Other (see comments), Comment: neuropathy) Administration: 342 mg (05/31/2018), 342 mg (06/21/2018), 264 mg (07/12/2018), 264 mg (08/03/2018), 264 mg (08/25/2018), 264 mg (09/15/2018)  for chemotherapy treatment.     10/12/2018 -  Chemotherapy    The patient had palonosetron (ALOXI) injection 0.25 mg, 0.25 mg, Intravenous,  Once, 1 of 6 cycles Administration: 0.25 mg (10/12/2018) CARBOplatin (PARAPLATIN) 550 mg in sodium chloride 0.9 % 250 mL chemo infusion, 550 mg (100 % of original dose 545.5 mg), Intravenous,  Once, 1 of 6 cycles Dose modification:   (original dose 545.5 mg, Cycle 1) Administration: 550 mg (10/12/2018)  PACLitaxel (TAXOL) 276 mg in sodium chloride 0.9 % 250 mL chemo infusion (> 80mg /m2), 135 mg/m2 = 276 mg (100 % of original dose 135 mg/m2), Intravenous,  Once, 1 of 6 cycles Dose modification: 135 mg/m2 (original dose 135 mg/m2, Cycle 1, Reason: Dose not tolerated) Administration:  276 mg (10/12/2018) pembrolizumab (KEYTRUDA) 200 mg in sodium chloride 0.9 % 50 mL chemo infusion, 200 mg, Intravenous, Once, 1 of 6 cycles Administration: 200 mg (10/12/2018) fosaprepitant (EMEND) 150 mg, dexamethasone (DECADRON) 12 mg in sodium chloride 0.9 % 145 mL IVPB, , Intravenous,  Once, 1 of 6 cycles Administration:  (10/12/2018)  for chemotherapy treatment.      ALLERGIES:  is allergic to ibuprofen and penicillins.  MEDICATIONS:  Current Facility-Administered Medications  Medication Dose Route Frequency Provider Last Rate Last Dose   0.9 %  sodium chloride infusion   Intravenous PRN Loletha Grayer, MD 10 mL/hr at 12/13/18 0857 500 mL at 12/13/18 0857   acetaminophen (TYLENOL) tablet 650 mg  650 mg Oral Q6H PRN Lance Coon, MD   650 mg at 12/08/18 0133   Or   acetaminophen (TYLENOL) suppository 650 mg  650 mg Rectal Q6H PRN Lance Coon, MD       ALPRAZolam Duanne Moron) tablet 0.5 mg  0.5 mg Oral Q4H Dustin Flock, MD   0.5 mg at 12/13/18 0859   amLODipine (NORVASC) tablet 5 mg  5 mg Oral Daily Lance Coon, MD   5 mg at 12/13/18 2423   aspirin chewable tablet 81 mg  81 mg Oral Daily Lance Coon, MD   81 mg at 12/13/18 0859   atorvastatin (LIPITOR) tablet 40 mg  40 mg Oral Daily Lance Coon, MD   40 mg at 12/13/18 0859   budesonide (PULMICORT) nebulizer solution 0.5 mg  0.5 mg Nebulization BID Loletha Grayer, MD   0.5 mg at 12/13/18 5361   calcium carbonate (TUMS - dosed in mg elemental calcium) chewable tablet 200 mg of elemental calcium  1 tablet Oral TID PRN Loletha Grayer, MD       ceFEPIme (MAXIPIME) 2 g in sodium chloride 0.9 % 100 mL IVPB  2 g Intravenous Q8H Loletha Grayer, MD   Stopped at 12/13/18 0501   chlorhexidine (PERIDEX) 0.12 % solution 15 mL  15 mL Mouth Rinse BID Lance Coon, MD   15 mL at 12/13/18 0852   enoxaparin (LOVENOX) injection 40 mg  40 mg Subcutaneous Q24H Lance Coon, MD   40 mg at 12/12/18 2106   fluconazole (DIFLUCAN) IVPB  100 mg  100 mg Intravenous q morning - 10a Loletha Grayer, MD 50 mL/hr at 12/13/18 0858 100 mg at 12/13/18 0858   hydroxychloroquine (PLAQUENIL) tablet 200 mg  200 mg Oral BID Lance Coon, MD   200 mg at 12/13/18 0859   insulin aspart (novoLOG) injection 0-9 Units  0-9 Units Subcutaneous TID AC & HS Lance Coon, MD   1 Units at 12/13/18 0852   insulin glargine (LANTUS) injection 36 Units  36 Units Subcutaneous q morning - 10a Dustin Flock, MD   36 Units at 12/13/18 0900   ipratropium-albuterol (DUONEB) 0.5-2.5 (3) MG/3ML nebulizer solution 3 mL  3 mL Inhalation Q6H Loletha Grayer, MD   3 mL at 12/13/18 0734   MEDLINE mouth rinse  15 mL Mouth Rinse q12n4p Lance Coon, MD   15 mL at 12/12/18 1610   metoprolol tartrate (LOPRESSOR) tablet 25 mg  25 mg Oral BID Lance Coon, MD   25 mg at 12/13/18 [phone]  morphine 2 MG/ML injection 1 mg  1 mg Intravenous Q4H PRN Dustin Flock, MD   1 mg at 12/11/18 0606   ondansetron (ZOFRAN) tablet 4 mg  4 mg Oral Q6H PRN Lance Coon, MD       Or   ondansetron [facility]) injection 4 mg  4 mg Intravenous Q6H PRN Lance Coon, MD       oxyCODONE (Oxy IR/ROXICODONE) immediate release tablet 10 mg  10 mg Oral Q6H PRN Lance Coon, MD   10 mg at 12/08/18 1702   oxyCODONE (OXYCONTIN) 12 hr tablet 20 mg  20 mg Oral Laurence Spates, MD   20 mg at 12/13/18 0859   pantoprazole (PROTONIX) EC tablet 40 mg  40 mg Oral Daily Lance Coon, MD   40 mg at 12/13/18 0859   predniSONE (DELTASONE) tablet 60 mg  60 mg Oral Q breakfast Cammie Sickle, MD   60 mg at 12/13/18 0859   protein supplement (ENSURE MAX) liquid  11 oz Oral BID BM Loletha Grayer, MD   11 oz at 12/13/18 0900    VITAL SIGNS: BP 139/60 (BP Location: Right Arm)    Pulse 79    Temp 98.4 F (36.9 C) (Oral)    Resp 20    Ht 5\' 6"  (1.676 m)    Wt 180 lb (81.6 kg)    SpO2 97%    BMI 29.05 kg/m  Filed Weights   12/07/18 1359  Weight: 180 lb (81.6 kg)    Estimated body mass  index is 29.05 kg/m as calculated from the following:   Height as of this encounter: 5\' 6"  (1.676 m).   Weight as of this encounter: 180 lb (81.6 kg).  LABS: CBC:    Component Value Date/Time   WBC 18.3 (H) 12/09/2018 0320   HGB 8.0 (L) 12/09/2018 0320   HGB 11.2 (L) 02/12/2014 0347   HCT 27.3 (L) 12/09/2018 0320   HCT 33.7 (L) 02/12/2014 0347   PLT 386 12/09/2018 0320   PLT 404 02/12/2014 0347   MCV 99.3 12/09/2018 0320   MCV 91 02/12/2014 0347   NEUTROABS 18.9 (H) 12/07/2018 1354   NEUTROABS 5.7 02/12/2014 0347   LYMPHSABS 0.7 12/07/2018 1354   LYMPHSABS 2.9 02/12/2014 0347   MONOABS 1.7 (H) 12/07/2018 1354   MONOABS 1.2 (H) 02/12/2014 0347   EOSABS 0.1 12/07/2018 1354   EOSABS 0.2 02/12/2014 0347   BASOSABS 0.1 12/07/2018 1354   BASOSABS 0.1 02/12/2014 0347   Comprehensive Metabolic Panel:    Component Value Date/Time   NA 136 12/10/2018 0552   NA 133 (L) 02/09/2014 0526   K 4.8 12/10/2018 0552   K 4.2 02/09/2014 0526   CL 108 12/10/2018 0552   CL 102 02/09/2014 0526   CO2 23 12/10/2018 0552   CO2 26 02/09/2014 0526   BUN 46 (H) 12/10/2018 0552   BUN 11 02/09/2014 0526   CREATININE 0.97 12/10/2018 0552   CREATININE 0.78 02/09/2014 0526   GLUCOSE 388 (H) 12/10/2018 0552   GLUCOSE 220 (H) 02/09/2014 0526   CALCIUM 8.7 (L) 12/10/2018 0552   CALCIUM 8.4 (L) 02/09/2014 0526   AST 30 12/08/2018 0545   AST 16 02/08/2014 1034   ALT 16 12/08/2018 0545   ALT 12 02/08/2014 1034   ALKPHOS 83 12/08/2018 0545   ALKPHOS 113 02/08/2014 1034   BILITOT 0.5 12/08/2018 0545   BILITOT 0.3 02/08/2014 1034   PROT 7.9 12/08/2018 0545   PROT 7.6 02/08/2014 1034   ALBUMIN  2.1 (L) 12/08/2018 0545   ALBUMIN 2.9 (L) 02/08/2014 1034    RADIOGRAPHIC STUDIES: Dg Chest 1 View  Result Date: 12/03/2018 CLINICAL DATA:  Fever. History of lung cancer in 2019. History of pneumonia, diabetes, former smoker. EXAM: CHEST  1 VIEW COMPARISON:  Chest x-rays dated 11/29/2018 and 10/26/2018.  Chest CT dated 11/29/2018. FINDINGS: Heart size and mediastinal contours appear stable. RIGHT chest wall Port-A-Cath appears stable in position with tip at the level of the mid SVC. Persistent near complete opacification of the LEFT hemithorax, with small residual aeration at the LEFT lung apex. Increased interstitial markings within the RIGHT lung suggesting developing edema. No pneumothorax seen. Osseous structures about the chest are unremarkable. IMPRESSION: Stable chest x-ray. Persistent near complete opacification of the LEFT hemithorax, compatible with the combination of cavitary LEFT upper lobe pulmonary lesion (known lung cancer) and the progressive interstitial and ill-defined airspace disease in the LEFT lower lobe better demonstrated on chest CT of 11/29/2018 with differential of metastatic progression versus superimposed pneumonia. Electronically Signed   By: Franki Cabot M.D.   On: 12/03/2018 13:55   Dg Chest 2 View  Result Date: 11/20/2018 CLINICAL DATA:  Shortness of breath.  Left lower extremity edema. EXAM: CHEST - 2 VIEW COMPARISON:  November 07, 2018 chest x-ray and chest CT FINDINGS: A right Port-A-Cath is stable. Infiltrate in the left lung has worsened in the interval. No other interval changes. IMPRESSION: Worsening infiltrate in the left lung suggesting the possibility of worsening pneumonia. Electronically Signed   By: Dorise Bullion III M.D   On: 11/20/2018 17:32   Ct Chest W Contrast  Result Date: 11/29/2018 CLINICAL DATA:  Shortness of breath and increasing oxygen requirements. EXAM: CT CHEST WITH CONTRAST TECHNIQUE: Multidetector CT imaging of the chest was performed during intravenous contrast administration. CONTRAST:  94mL OMNIPAQUE IOHEXOL 300 MG/ML  SOLN COMPARISON:  11/07/2018 FINDINGS: Cardiovascular: The heart size is normal. No substantial pericardial effusion. Coronary artery calcification is evident. Atherosclerotic calcification is noted in the wall of the thoracic  aorta. Right Port-A-Cath tip is positioned in the mid SVC. Main pulmonary arteries are enlarged. Mediastinum/Nodes: 8 mm short axis subcarinal lymph node evident. Precarinal lymph node measures up to 11 mm short axis. Multiple small nodes are seen in the prevascular space. There is no axillary lymphadenopathy. Lungs/Pleura: The central tracheobronchial airways are patent. Centrilobular and paraseptal emphysema evident. volume loss left hemithorax is similar to prior. Continued progression of interstitial and airspace disease in the left upper lobe surrounding the cavitary mass in this patient with known lung cancer.Interval progression of interstitial and irregular, ill-defined airspace disease in the left base with peripheral predominance. Areas of subpleural reticulation in the right lung are similar to prior. Upper Abdomen: Unremarkable Musculoskeletal: No worrisome lytic or sclerotic osseous abnormality. Old left rib fracture evident. IMPRESSION: 1. Volume loss left hemithorax with similar appearance of cavitary left upper lobe pulmonary lesion in this patient with known lung cancer. Since 11/07/2018, there is been progression of interstitial and ill-defined airspace disease in the left lower lobe which may represent metastatic progression or infectious etiology. 2. Borderline to mild mediastinal lymphadenopathy. Metastatic disease a concern. 3. Enlargement of the main pulmonary arteries compatible with pulmonary arterial hypertension. 4.  Aortic Atherosclerois (ICD10-170.0) 5.  Emphysema. (GUR42-H06.9) Electronically Signed   By: Misty Stanley M.D.   On: 11/29/2018 18:55   Dg Chest Port 1 View  Result Date: 12/11/2018 CLINICAL DATA:  Shortness of breath. History of lung cancer. EXAM: PORTABLE CHEST  1 VIEW COMPARISON:  12/07/2018 FINDINGS: Right jugular Port-A-Cath terminates over the mid SVC. The cardiomediastinal silhouette is unchanged with persistent obscuration of the left heart border. Lung volumes  remain low. Diffuse bilateral interstitial and patchy airspace opacities are again seen involving the left greater than right lungs with slight interval worsening including in the right lower lung. A left pleural effusion persists. No pneumothorax is identified. IMPRESSION: Slight worsening of bilateral airspace disease. Electronically Signed   By: Logan Bores M.D.   On: 12/11/2018 09:13   Dg Chest Port 1 View  Result Date: 12/07/2018 CLINICAL DATA:  Fever and respiratory distress EXAM: PORTABLE CHEST 1 VIEW COMPARISON:  12/03/2018 FINDINGS: Slightly decreased lung volumes noted with stable to slight increase in bilateral interstitial/airspace opacities. LEFT pleural effusion is again noted with LEFT hemithorax volume loss. A RIGHT IJ Port-A-Cath is again identified with tip overlying the mid SVC. No pneumothorax. IMPRESSION: Decreased lung volumes with stable to slight increase in diffuse bilateral interstitial/airspace opacities. No other significant change. Electronically Signed   By: Margarette Canada M.D.   On: 12/07/2018 15:16   Dg Chest Port 1 View  Result Date: 11/29/2018 CLINICAL DATA:  Shortness of breath.  Exposure to COVID-19. EXAM: PORTABLE CHEST 1 VIEW COMPARISON:  Chest x-rays dated 11/20/2018 and 11/07/2018 and chest CTs dated 11/07/2018 and 10/04/2018 FINDINGS: There is a persistent extensive infiltrate in the left lung superimposed on chronic changes including a cavitary lesion in the left midzone laterally. Overall heart size is normal. Aortic atherosclerosis. Power port in good position. Right lung is clear. No acute bone abnormality. IMPRESSION: No change in the appearance of the chest since the prior study of 11/20/2018. Persistent extensive infiltrate on the left superimposed on chronic changes demonstrated on the prior CT scans. Aortic Atherosclerosis (ICD10-I70.0). Electronically Signed   By: Lorriane Shire M.D.   On: 11/29/2018 16:06    PERFORMANCE STATUS (ECOG) : 3 - Symptomatic, >50%  confined to bed  Review of Systems Unless otherwise noted, a complete review of systems is negative.  Physical Exam General: NAD, frail appearing, thin Pulmonary: on O2, somewhat labored with speaking Extremities: no edema Skin: no rashes Neurological: Weakness but otherwise nonfocal  IMPRESSION: Follow up visit made today.  Remains on HFNC 45L with 60% fio2. Patient says she feels breathing is somewhat improved today. No significant clinical change. RT weaning O2 as tolerated.   No family present today.   Case and plan discussed with Dr. Tasia Catchings.  PLAN: -Continue current scope of treatment -Home with hospice if able to be weaned from HFNC -Will follow  Time Total: 15 minutes  Visit consisted of counseling and education dealing with the complex and emotionally intense issues of symptom management and palliative care in the setting of serious and potentially life-threatening illness.Greater than 50%  of this time was spent counseling and coordinating care related to the above assessment and plan.  Signed by: Altha Harm, PhD, NP-C [phone] (Work Cell)

## 2018-12-13 NOTE — Progress Notes (Signed)
Patient ID: Tamara Parrish, female   DOB: February 15, 1956, 63 y.o.   MRN: [id number]  [facility] PROGRESS NOTE  Tamara Parrish XIP:[id number] DOB: 05/02/1956 DOA: 12/07/2018 PCP: Glendon Axe, MD  HPI/Subjective:  Continues to be short of breath    Objective: Vitals:   12/13/18 0419 12/13/18 0855  BP: 140/72 139/60  Pulse: 68 79  Resp: (!) 22 20  Temp: (!) 97.5 F (36.4 C) 98.4 F (36.9 C)  SpO2: 99% 97%    Intake/Output Summary (Last 24 hours) at 12/13/2018 1159 Last data filed at 12/13/2018 0900 Gross per 24 hour  Intake 792.86 ml  Output 825 ml  Net -32.14 ml   Filed Weights   12/07/18 1359  Weight: 81.6 kg    ROS: Review of Systems  Constitutional: Positive for malaise/fatigue. Negative for chills and fever.  Eyes: Negative for blurred vision.  Respiratory: Positive for cough, shortness of breath and wheezing.   Cardiovascular: Negative for chest pain.  Gastrointestinal: Negative for abdominal pain, constipation, diarrhea, nausea and vomiting.  Genitourinary: Negative for dysuria.  Musculoskeletal: Negative for joint pain.  Neurological: Negative for dizziness and headaches.   Exam: Physical Exam  Constitutional: She is oriented to person, place, and time.  HENT:  Nose: No mucosal edema.  Mouth/Throat: No oropharyngeal exudate or posterior oropharyngeal edema.  Ritta Slot has resolved  Eyes: Pupils are equal, round, and reactive to light. Conjunctivae, EOM and lids are normal.  Neck: No JVD present. Carotid bruit is not present. No edema present. No thyroid mass and no thyromegaly present.  Cardiovascular: S1 normal and S2 normal. Exam reveals no gallop.  No murmur heard. Pulses:      Dorsalis pedis pulses are 2+ on the right side and 2+ on the left side.  Respiratory: No respiratory distress. She has decreased breath sounds in the right lower field and the left lower field. She has no wheezes. She has rhonchi in the right lower field and the left  lower field. She has no rales.  GI: Soft. Bowel sounds are normal. There is no abdominal tenderness.  Musculoskeletal:     Right ankle: She exhibits swelling.     Left ankle: She exhibits swelling.  Lymphadenopathy:    She has no cervical adenopathy.  Neurological: She is alert and oriented to person, place, and time. No cranial nerve deficit.  Skin: Skin is warm. Nails show no clubbing.  Redness on the buttock  Psychiatric: She has a normal mood and affect.      Data Reviewed: Basic Metabolic Panel: Recent Labs  Lab 12/07/18 1354 12/08/18 0545 12/08/18 1359 12/09/18 0320 12/10/18 0552  NA 134* 132*  --  135 136  K 4.9 5.5* 5.2* 5.5* 4.8  CL 101 103  --  107 108  CO2 21* 22  --  22 23  GLUCOSE 233* 318*  --  231* 388*  BUN 22 34*  --  50* 46*  CREATININE 0.93 1.11*  --  1.12* 0.97  CALCIUM 8.3* 8.4*  --  8.5* 8.7*   Liver Function Tests: Recent Labs  Lab 12/07/18 1354 12/08/18 0545  AST 33 30  ALT 15 16  ALKPHOS 86 83  BILITOT 0.6 0.5  PROT 8.0 7.9  ALBUMIN 2.3* 2.1*   CBC: Recent Labs  Lab 12/07/18 1354 12/08/18 0545 12/09/18 0320  WBC 21.9* 17.8* 18.3*  NEUTROABS 18.9*  --   --   HGB 9.0* 7.9* 8.0*  HCT 30.4* 26.1* 27.3*  MCV 97.4 97.4 99.3  PLT 353 382 386   Cardiac Enzymes: Recent Labs  Lab 12/07/18 1354 12/08/18 0020 12/08/18 0545  TROPONINI 0.07* 0.05* 0.04*   BNP (last 3 results) Recent Labs    11/20/18 1948 11/29/18 1559  BNP 65.0 142.0*    ProBNP (last 3 results) No results for input(s): PROBNP in the last 8760 hours.  CBG: Recent Labs  Lab 12/12/18 0748 12/12/18 1159 12/12/18 1643 12/12/18 2112 12/13/18 0740  GLUCAP 145* 137* 174* 250* 127*    Recent Results (from the past 240 hour(s))  Blood Culture (routine x 2)     Status: None   Collection Time: 12/07/18  2:14 PM  Result Value Ref Range Status   Specimen Description BLOOD LEFT ANTECUBITAL  Final   Special Requests   Final    BOTTLES DRAWN AEROBIC AND ANAEROBIC  Blood Culture adequate volume   Culture   Final    NO GROWTH 5 DAYS Performed at [facility], [address] [place], [place] [address]    Report Status 12/12/2018 FINAL  Final  Blood Culture (routine x 2)     Status: None   Collection Time: 12/07/18  2:14 PM  Result Value Ref Range Status   Specimen Description BLOOD RIGHT ANTECUBITAL  Final   Special Requests   Final    BOTTLES DRAWN AEROBIC AND ANAEROBIC Blood Culture adequate volume   Culture   Final    NO GROWTH 5 DAYS Performed at [facility], [address], [place], [place] [address]    Report Status 12/12/2018 FINAL  Final  SARS Coronavirus 2 [facility] order, Performed in [facility] hospital lab)     Status: None   Collection Time: 12/07/18  2:14 PM  Result Value Ref Range Status   SARS Coronavirus 2 NEGATIVE NEGATIVE Final    Comment: (NOTE) If result is NEGATIVE SARS-CoV-2 target nucleic acids are NOT DETECTED. The SARS-CoV-2 RNA is generally detectable in upper and lower  respiratory specimens during the acute phase of infection. The lowest  concentration of SARS-CoV-2 viral copies this assay can detect is 250  copies / mL. A negative result does not preclude SARS-CoV-2 infection  and should not be used as the sole basis for treatment or other  patient management decisions.  A negative result may occur with  improper specimen collection / handling, submission of specimen other  than nasopharyngeal swab, presence of viral mutation(s) within the  areas targeted by this assay, and inadequate number of viral copies  (<250 copies / mL). A negative result must be combined with clinical  observations, patient history, and epidemiological information. If result is POSITIVE SARS-CoV-2 target nucleic acids are DETECTED. The SARS-CoV-2 RNA is generally detectable in upper and lower  respiratory specimens dur ing the acute phase of infection.  Positive  results are indicative of active infection with  SARS-CoV-2.  Clinical  correlation with patient history and other diagnostic information is  necessary to determine patient infection status.  Positive results do  not rule out bacterial infection or co-infection with other viruses. If result is PRESUMPTIVE POSTIVE SARS-CoV-2 nucleic acids MAY BE PRESENT.   A presumptive positive result was obtained on the submitted specimen  and confirmed on repeat testing.  While 2019 novel coronavirus  (SARS-CoV-2) nucleic acids may be present in the submitted sample  additional confirmatory testing may be necessary for epidemiological  and / or clinical management purposes  to differentiate between  SARS-CoV-2 and other Sarbecovirus currently known to infect humans.  If  clinically indicated additional testing with an alternate test  methodology [phone]) is advised. The SARS-CoV-2 RNA is generally  detectable in upper and lower respiratory sp ecimens during the acute  phase of infection. The expected result is Negative. Fact Sheet for Patients:  StrictlyIdeas.no Fact Sheet for Healthcare Providers: BankingDealers.co.za This test is not yet approved or cleared by the [place] FDA and has been authorized for detection and/or diagnosis of SARS-CoV-2 by FDA under an Emergency Use Authorization (EUA).  This EUA will remain in effect (meaning this test can be used) for the duration of the COVID-19 declaration under Section 564(b)(1) of the Act, 21 U.S.C. section 360bbb-3(b)(1), unless the authorization is terminated or revoked sooner. Performed at [facility], [place] [address] [place], [place] [address]   Urine culture     Status: None   Collection Time: 12/08/18  3:15 AM  Result Value Ref Range Status   Specimen Description   Final    URINE, RANDOM Performed at [facility], [address], [place], [place] [address]    Special Requests   Final    Immunocompromised Performed at  [facility], [address] [place], [place] [address]    Culture   Final    NO GROWTH Performed at [facility], [place] [address], [place], [place] [address]    Report Status 12/09/2018 FINAL  Final     Studies: No results found.  Scheduled Meds: . ALPRAZolam  0.5 mg Oral Q4H  . amLODipine  5 mg Oral Daily  . aspirin  81 mg Oral Daily  . atorvastatin  40 mg Oral Daily  . budesonide (PULMICORT) nebulizer solution  0.5 mg Nebulization BID  . chlorhexidine  15 mL Mouth Rinse BID  . enoxaparin (LOVENOX) injection  40 mg Subcutaneous Q24H  . hydroxychloroquine  200 mg Oral BID  . insulin aspart  0-9 Units Subcutaneous TID AC & HS  . insulin glargine  36 Units Subcutaneous q morning - 10a  . ipratropium-albuterol  3 mL Inhalation Q6H  . mouth rinse  15 mL Mouth Rinse q12n4p  . metoprolol tartrate  25 mg Oral BID  . oxyCODONE  20 mg Oral Q12H  . pantoprazole  40 mg Oral Daily  . predniSONE  60 mg Oral Q breakfast  . Ensure Max Protein  11 oz Oral BID BM   Continuous Infusions: . sodium chloride 500 mL (12/13/18 0857)  . ceFEPime (MAXIPIME) IV Stopped (12/13/18 0501)  . fluconazole (DIFLUCAN) IV 100 mg (12/13/18 0858)    Assessment/Plan:  1. Acute on chronic hypoxic respiratory failure.  Patient on high flow nasal cannula.   No significant improvement continue prednisone prognosis overall poor, repeat chest x-ray shows worsening I will repeat a procalcitonin level is normal suggesting respiratory failure likely due to worsening cancer  2. Clinical sepsis.  Hard to tell if this is healthcare associated pneumonia or not.  Discontinue cefepime 3. thrush.  Status post treatment discontinue IV Diflucan 4. Stage IV cancer of the lung.  Any treatments at this point would be palliative in nature.  Dr. Tasia Catchings spoke with daughter Theadora Rama and recommended comfort care and hospice.  No significant improvement suspect patient will likely need to be comfort care 5. Rheumatoid  arthritis on Plaquenil 6. Type 2 diabetes on glargine insulin and sliding scale.  I we will can increase her glargine again 7. Hyperkalemia.  Now resolved  8. hyperlipidemia unspecified on Lipitor 9. GERD on Protonix  Code Status:  Code Status Orders  (From admission, onward)         Start     Ordered   12/07/18 2346  Full code  Continuous     12/07/18 2345        Code Status History    Date Active Date Inactive Code Status Order ID Comments User Context   11/29/2018 2250 12/04/2018 1628 Full Code [id number]  Lance Coon, MD Inpatient   11/20/2018 2045 11/22/2018 1806 Full Code [id number]  Loletha Grayer, MD ED   10/26/2018 2037 10/28/2018 2050 Partial Code [id number]  Hillary Bow, MD Inpatient   10/26/2018 1728 10/26/2018 2037 DNR [id number]  Hillary Bow, MD ED   02/21/2018 2004 02/24/2018 1542 Full Code [id number]  Gladstone Lighter, MD ED   02/07/2018 2202 02/11/2018 1738 Full Code [id number]  Dustin Flock, MD Inpatient     Family Communication: Spoke with daughter Theadora Rama on the phone.  The patient has chosen her is the patient's primary contact. Disposition Plan: The patient will go to [name]'s house upon discharge  Antibiotics:  Cefepime  Diflucan  Time spent: 28 minutes  [place]

## 2018-12-14 ENCOUNTER — Inpatient Hospital Stay: Payer: Medicaid Other | Admitting: Oncology

## 2018-12-14 LAB — CREATININE, SERUM
Creatinine, Ser: 0.59 mg/dL (ref 0.44–1.00)
GFR calc Af Amer: >60 mL/min (ref >60)
GFR calc non Af Amer: >60 mL/min (ref >60)

## 2018-12-14 LAB — GLUCOSE, CAPILLARY
Glucose-Capillary: 112 mg/dL — ABNORMAL HIGH (ref 70–99)
Glucose-Capillary: 145 mg/dL — ABNORMAL HIGH (ref 70–99)
Glucose-Capillary: 290 mg/dL — ABNORMAL HIGH (ref 70–99)
Glucose-Capillary: 337 mg/dL — ABNORMAL HIGH (ref 70–99)

## 2018-12-14 NOTE — Progress Notes (Signed)
Patient ID: Tamara Parrish, female   DOB: 07/21/1956, 62 y.o.   MRN: [id number]  [facility] PROGRESS NOTE  Tamara Parrish MWN:[id number] DOB: Feb 13, 1956 DOA: 12/07/2018 PCP: Glendon Axe, MD  HPI/Subjective:  Patient's breathing is better this morning    Objective: Vitals:   12/14/18 0733 12/14/18 0750  BP:  117/71  Pulse:  70  Resp:  18  Temp:  97.6 F (36.4 C)  SpO2: 95% 96%    Intake/Output Summary (Last 24 hours) at 12/14/2018 0956 Last data filed at 12/14/2018 0900 Gross per 24 hour  Intake 1045.96 ml  Output 775 ml  Net 270.96 ml   Filed Weights   12/07/18 1359  Weight: 81.6 kg    ROS: Review of Systems  Constitutional: Positive for malaise/fatigue. Negative for chills and fever.  Eyes: Negative for blurred vision.  Respiratory: Positive for shortness of breath. Negative for cough and wheezing.   Cardiovascular: Negative for chest pain.  Gastrointestinal: Negative for abdominal pain, constipation, diarrhea, nausea and vomiting.  Genitourinary: Negative for dysuria.  Musculoskeletal: Negative for joint pain.  Neurological: Negative for dizziness and headaches.   Exam: Physical Exam  Constitutional: She is oriented to person, place, and time.  HENT:  Nose: No mucosal edema.  Mouth/Throat: No oropharyngeal exudate or posterior oropharyngeal edema.  Ritta Slot has resolved  Eyes: Pupils are equal, round, and reactive to light. Conjunctivae, EOM and lids are normal.  Neck: No JVD present. Carotid bruit is not present. No edema present. No thyroid mass and no thyromegaly present.  Cardiovascular: S1 normal and S2 normal. Exam reveals no gallop.  No murmur heard. Pulses:      Dorsalis pedis pulses are 2+ on the right side and 2+ on the left side.  Respiratory: No respiratory distress. She has decreased breath sounds in the right lower field and the left lower field. She has no wheezes. She has rhonchi in the right lower field and the left lower  field. She has no rales.  GI: Soft. Bowel sounds are normal. There is no abdominal tenderness.  Musculoskeletal:     Right ankle: She exhibits swelling.     Left ankle: She exhibits swelling.  Lymphadenopathy:    She has no cervical adenopathy.  Neurological: She is alert and oriented to person, place, and time. No cranial nerve deficit.  Skin: Skin is warm. Nails show no clubbing.  Redness on the buttock  Psychiatric: She has a normal mood and affect.      Data Reviewed: Basic Metabolic Panel: Recent Labs  Lab 12/07/18 1354 12/08/18 0545 12/08/18 1359 12/09/18 0320 12/10/18 0552 12/14/18 0615  NA 134* 132*  --  135 136  --   K 4.9 5.5* 5.2* 5.5* 4.8  --   CL 101 103  --  107 108  --   CO2 21* 22  --  22 23  --   GLUCOSE 233* 318*  --  231* 388*  --   BUN 22 34*  --  50* 46*  --   CREATININE 0.93 1.11*  --  1.12* 0.97 0.59  CALCIUM 8.3* 8.4*  --  8.5* 8.7*  --    Liver Function Tests: Recent Labs  Lab 12/07/18 1354 12/08/18 0545  AST 33 30  ALT 15 16  ALKPHOS 86 83  BILITOT 0.6 0.5  PROT 8.0 7.9  ALBUMIN 2.3* 2.1*   CBC: Recent Labs  Lab 12/07/18 1354 12/08/18 0545 12/09/18 0320  WBC 21.9* 17.8* 18.3*  NEUTROABS 18.9*  --   --  HGB 9.0* 7.9* 8.0*  HCT 30.4* 26.1* 27.3*  MCV 97.4 97.4 99.3  PLT 353 382 386   Cardiac Enzymes: Recent Labs  Lab 12/07/18 1354 12/08/18 0020 12/08/18 0545  TROPONINI 0.07* 0.05* 0.04*   BNP (last 3 results) Recent Labs    11/20/18 1948 11/29/18 1559  BNP 65.0 142.0*    ProBNP (last 3 results) No results for input(s): PROBNP in the last 8760 hours.  CBG: Recent Labs  Lab 12/13/18 0740 12/13/18 1158 12/13/18 1711 12/13/18 2114 12/14/18 0751  GLUCAP 127* 113* 254* 308* 112*    Recent Results (from the past 240 hour(s))  Blood Culture (routine x 2)     Status: None   Collection Time: 12/07/18  2:14 PM  Result Value Ref Range Status   Specimen Description BLOOD LEFT ANTECUBITAL  Final   Special  Requests   Final    BOTTLES DRAWN AEROBIC AND ANAEROBIC Blood Culture adequate volume   Culture   Final    NO GROWTH 5 DAYS Performed at [facility], [address] [place], [place] [address]    Report Status 12/12/2018 FINAL  Final  Blood Culture (routine x 2)     Status: None   Collection Time: 12/07/18  2:14 PM  Result Value Ref Range Status   Specimen Description BLOOD RIGHT ANTECUBITAL  Final   Special Requests   Final    BOTTLES DRAWN AEROBIC AND ANAEROBIC Blood Culture adequate volume   Culture   Final    NO GROWTH 5 DAYS Performed at [facility], [address] [place], [place] [address]    Report Status 12/12/2018 FINAL  Final  SARS Coronavirus 2 [facility] order, Performed in [place] hospital lab)     Status: None   Collection Time: 12/07/18  2:14 PM  Result Value Ref Range Status   SARS Coronavirus 2 NEGATIVE NEGATIVE Final    Comment: (NOTE) If result is NEGATIVE SARS-CoV-2 target nucleic acids are NOT DETECTED. The SARS-CoV-2 RNA is generally detectable in upper and lower  respiratory specimens during the acute phase of infection. The lowest  concentration of SARS-CoV-2 viral copies this assay can detect is 250  copies / mL. A negative result does not preclude SARS-CoV-2 infection  and should not be used as the sole basis for treatment or other  patient management decisions.  A negative result may occur with  improper specimen collection / handling, submission of specimen other  than nasopharyngeal swab, presence of viral mutation(s) within the  areas targeted by this assay, and inadequate number of viral copies  (<250 copies / mL). A negative result must be combined with clinical  observations, patient history, and epidemiological information. If result is POSITIVE SARS-CoV-2 target nucleic acids are DETECTED. The SARS-CoV-2 RNA is generally detectable in upper and lower  respiratory specimens dur ing the acute phase of infection.   Positive  results are indicative of active infection with SARS-CoV-2.  Clinical  correlation with patient history and other diagnostic information is  necessary to determine patient infection status.  Positive results do  not rule out bacterial infection or co-infection with other viruses. If result is PRESUMPTIVE POSTIVE SARS-CoV-2 nucleic acids MAY BE PRESENT.   A presumptive positive result was obtained on the submitted specimen  and confirmed on repeat testing.  While 2019 novel coronavirus  (SARS-CoV-2) nucleic acids may be present in the submitted sample  additional confirmatory testing may be necessary for epidemiological  and / or clinical management purposes  to differentiate between  SARS-CoV-2 and other Sarbecovirus currently known to infect humans.  If clinically indicated additional testing with an alternate test  methodology [phone]) is advised. The SARS-CoV-2 RNA is generally  detectable in upper and lower respiratory sp ecimens during the acute  phase of infection. The expected result is Negative. Fact Sheet for Patients:  StrictlyIdeas.no Fact Sheet for Healthcare Providers: BankingDealers.co.za This test is not yet approved or cleared by the [place] FDA and has been authorized for detection and/or diagnosis of SARS-CoV-2 by FDA under an Emergency Use Authorization (EUA).  This EUA will remain in effect (meaning this test can be used) for the duration of the COVID-19 declaration under Section 564(b)(1) of the Act, 21 U.S.C. section 360bbb-3(b)(1), unless the authorization is terminated or revoked sooner. Performed at [facility], [place] [address] [place], [place] [address]   Urine culture     Status: None   Collection Time: 12/08/18  3:15 AM  Result Value Ref Range Status   Specimen Description   Final    URINE, RANDOM Performed at [facility], [address], [place], [place] [address]     Special Requests   Final    Immunocompromised Performed at [facility], [address] [place], [place] [address]    Culture   Final    NO GROWTH Performed at [facility], [place] [address], [place], [place] [address]    Report Status 12/09/2018 FINAL  Final     Studies: No results found.  Scheduled Meds: . ALPRAZolam  0.5 mg Oral Q4H  . amLODipine  5 mg Oral Daily  . aspirin  81 mg Oral Daily  . atorvastatin  40 mg Oral Daily  . budesonide (PULMICORT) nebulizer solution  0.5 mg Nebulization BID  . chlorhexidine  15 mL Mouth Rinse BID  . enoxaparin (LOVENOX) injection  40 mg Subcutaneous Q24H  . hydroxychloroquine  200 mg Oral BID  . insulin aspart  0-9 Units Subcutaneous TID AC & HS  . insulin glargine  36 Units Subcutaneous q morning - 10a  . ipratropium-albuterol  3 mL Inhalation Q6H  . mouth rinse  15 mL Mouth Rinse q12n4p  . metoprolol tartrate  25 mg Oral BID  . oxyCODONE  20 mg Oral Q12H  . pantoprazole  40 mg Oral Daily  . predniSONE  60 mg Oral Q breakfast  . Ensure Max Protein  11 oz Oral BID BM   Continuous Infusions: . sodium chloride Stopped (12/13/18 1500)    Assessment/Plan:  1. Acute on chronic hypoxic respiratory failure.  Patient on high flow nasal cannula.   Remains same requiring high flow oxygen 2. Clinical sepsis.  Status post treatment with antibiotic 3. thrush.  Status post treatment discontinue IV Diflucan 4. Stage IV cancer of the lung.  Any treatments at this point would be palliative in nature.  Dr. Tasia Catchings spoke with daughter Theadora Rama and recommended comfort care and hospice.  No significant improvement family wants patient to go to home with hospice however this is impossible with 10 L of oxygen patient will remain in the hospital 5. rheumatoid arthritis on Plaquenil 6. Type 2 diabetes on glargine insulin and sliding scale.  I we will can increase her glargine again 7. Hyperkalemia.  Now resolved  8. hyperlipidemia unspecified on  Lipitor 9. GERD on Protonix  Code Status:     Code Status Orders  (From admission, onward)         Start  Ordered   12/07/18 2346  Full code  Continuous     12/07/18 2345        Code Status History    Date Active Date Inactive Code Status Order ID Comments User Context   11/29/2018 2250 12/04/2018 1628 Full Code [id number]  Lance Coon, MD Inpatient   11/20/2018 2045 11/22/2018 1806 Full Code [id number]  Loletha Grayer, MD ED   10/26/2018 2037 10/28/2018 2050 Partial Code [id number]  Hillary Bow, MD Inpatient   10/26/2018 1728 10/26/2018 2037 DNR [id number]  Hillary Bow, MD ED   02/21/2018 2004 02/24/2018 1542 Full Code [id number]  Gladstone Lighter, MD ED   02/07/2018 2202 02/11/2018 1738 Full Code [id number]  Dustin Flock, MD Inpatient     Family Communication:  Disposition Plan: The patient will go to [name]'s house upon discharge  Antibiotics:  Cefepime  Diflucan  Time spent: 28 minutes  [facility]

## 2018-12-14 NOTE — Progress Notes (Signed)
Hematology/Oncology Progress Note [facility] [phone] [phone]  Patient Care Team: Glendon Axe, MD as PCP - General (Internal Medicine) Telford Nab, RN as Registered Nurse   Name of the patient: Tamara Parrish  [id number]  September 28, 1955  Date of visit: 12/14/18   INTERVAL HISTORY-  Patient reports breathing is somewhat better today.  She is sitting in the bed eating her lunch. Reports pain is fairly well controlled.    Review of systems- Review of Systems  Unable to perform ROS: Mental status change  Constitutional: Positive for fatigue.  Respiratory: Positive for shortness of breath.   Cardiovascular: Positive for leg swelling.  Gastrointestinal: Negative for abdominal distention and abdominal pain.  Genitourinary: Negative for difficulty urinating.   Musculoskeletal: Positive for arthralgias.  Neurological: Negative for dizziness.  Psychiatric/Behavioral: Negative for confusion.    Allergies  Allergen Reactions  . Ibuprofen Other (See Comments)    Is not supposed to take because of her kidneys.  . Penicillins Other (See Comments)    "welts" as a child (unknown age) Has patient had a PCN reaction causing immediate rash, facial/tongue/throat swelling, SOB or lightheadedness with hypotension: No Has patient had a PCN reaction causing severe rash involving mucus membranes or skin necrosis: No Has patient had a PCN reaction that required hospitalization: No Has patient had a PCN reaction occurring within the last 10 years: No If all of the above answers are "NO", then may proceed with Cephalosporin use.    Patient Active Problem List   Diagnosis Date Noted  . Respiratory distress   . SOB (shortness of breath)   . Pressure injury of skin 12/09/2018  . Cancer of upper lobe of left lung ([place]) 12/08/2018  . Sepsis ([place]) 11/29/2018  . Suspected Covid-19 Virus Infection 11/29/2018  . Palliative care encounter   . HCAP  (healthcare-associated pneumonia) 11/20/2018  . Dehydration 11/02/2018  . Anemia 10/26/2018  . Encounter for antineoplastic chemotherapy 07/12/2018  . Rheumatoid arthritis ([place]) 07/12/2018  . Thrombocytosis ([place]) 07/12/2018  . Goals of care, counseling/discussion 05/25/2018  . Malignant neoplasm of lung ([place]) 05/25/2018  . Recurrent pneumonia 02/21/2018  . PNA (pneumonia) 02/07/2018  . Elevated erythrocyte sedimentation rate 12/16/2017  . Dry cough 08/19/2017  . Rheumatoid nodule of multiple sites ([place]) 08/19/2017  . Polyneuropathy associated with underlying disease ([place]) 05/17/2017  . Seasonal allergic rhinitis due to pollen 05/17/2017  . Essential hypertension 10/09/2016  . Pure hypercholesterolemia 10/09/2016  . Rheumatoid arthritis, seropositive ([place]) 03/27/2016  . Polyarthralgia 03/18/2016  . Diabetes mellitus type 2, uncomplicated ([place]) 64/40/3474  . Allergic rhinitis 05/18/2014  . Microalbuminuria 04/18/2014     Past Medical History:  Diagnosis Date  . Cancer ([place])    lung ca DX 2019  . Collagen vascular disease (HCC)    RA  . DM2 (diabetes mellitus, type 2) ([place])   . Dyspnea   . Hyperlipemia   . Hypertension   . Osteomyelitis ([place])   . Pneumonia   . RA (rheumatoid arthritis) ([place])   . Rheumatoid arthritis [facility])      Past Surgical History:  Procedure Laterality Date  . ENDOBRONCHIAL ULTRASOUND N/A 05/13/2018   Procedure: ENDOBRONCHIAL ULTRASOUND;  Surgeon: [name], MD;  Location: [facility] ORS;  Service: Pulmonary;  Laterality: N/A;  . PORTA CATH INSERTION N/A 05/27/2018   Procedure: PORTA CATH INSERTION;  Surgeon: Katha Cabal, MD;  Location: [place] [facility];  Service: Cardiovascular;  Laterality: N/A;  . TOE SURGERY  Social History   Socioeconomic History  . Marital status: Single    Spouse name: Not on file  . Number of children: 2  . Years of education: Not on file  . Highest education level: Not on file  Occupational History   . Not on file  Social Needs  . Financial resource strain: Not very hard  . Food insecurity:    Worry: Never true    Inability: Never true  . Transportation needs:    Medical: No    Non-medical: No  Tobacco Use  . Smoking status: Former Smoker    Packs/day: 1.00    Years: 40.00    Pack years: 40.00    Types: Cigarettes    Last attempt to quit: 11/22/2017    Years since quitting: 1.0  . Smokeless tobacco: Never Used  . Tobacco comment: quit 2 weeks ago  Substance and Sexual Activity  . Alcohol use: Never    Frequency: Never  . Drug use: Never  . Sexual activity: Not Currently  Lifestyle  . Physical activity:    Days per week: 0 days    Minutes per session: Not on file  . Stress: To some extent  Relationships  . Social connections:    Talks on phone: Three times a week    Gets together: More than three times a week    Attends religious service: Not on file    Active member of club or organization: No    Attends meetings of clubs or organizations: Never    Relationship status: Never married  . Intimate partner violence:    Fear of current or ex partner: No    Emotionally abused: No    Physically abused: No    Forced sexual activity: No  Other Topics Concern  . Not on file  Social History Narrative   Living at home with daughter.  Ambulates with a walker at baseline.     Family History  Problem Relation Age of Onset  . Diabetes Mother   . Lung cancer Father      Current Facility-Administered Medications:  .  0.9 %  sodium chloride infusion, , Intravenous, PRN, Loletha Grayer, MD, Stopped at 12/13/18 1500 .  acetaminophen (TYLENOL) tablet 650 mg, 650 mg, Oral, Q6H PRN, 650 mg at 12/08/18 0133 **OR** acetaminophen (TYLENOL) suppository 650 mg, 650 mg, Rectal, Q6H PRN, Lance Coon, MD .  ALPRAZolam Duanne Moron) tablet 0.5 mg, 0.5 mg, Oral, Q4H, Dustin Flock, MD, 0.5 mg at 12/14/18 1030 .  amLODipine (NORVASC) tablet 5 mg, 5 mg, Oral, Daily, Lance Coon, MD, 5 mg  at 12/14/18 1031 .  aspirin chewable tablet 81 mg, 81 mg, Oral, Daily, Lance Coon, MD, 81 mg at 12/14/18 1030 .  atorvastatin (LIPITOR) tablet 40 mg, 40 mg, Oral, Daily, Lance Coon, MD, 40 mg at 12/14/18 1030 .  budesonide (PULMICORT) nebulizer solution 0.5 mg, 0.5 mg, Nebulization, BID, Leslye Peer, Richard, MD, 0.5 mg at 12/14/18 0733 .  calcium carbonate (TUMS - dosed in mg elemental calcium) chewable tablet 200 mg of elemental calcium, 1 tablet, Oral, TID PRN, Wieting, Richard, MD .  chlorhexidine (PERIDEX) 0.12 % solution 15 mL, 15 mL, Mouth Rinse, BID, Lance Coon, MD, 15 mL at 12/13/18 2148 .  enoxaparin (LOVENOX) injection 40 mg, 40 mg, Subcutaneous, Q24H, Lance Coon, MD, 40 mg at 12/13/18 2148 .  hydroxychloroquine (PLAQUENIL) tablet 200 mg, 200 mg, Oral, BID, Lance Coon, MD, 200 mg at 12/14/18 1031 .  insulin aspart (novoLOG) injection 0-9 Units,  0-9 Units, Subcutaneous, TID AC & HS, Lance Coon, MD, 1 Units at 12/14/18 1224 .  insulin glargine (LANTUS) injection 36 Units, 36 Units, Subcutaneous, q morning - 10a, Dustin Flock, MD, 36 Units at 12/14/18 1030 .  ipratropium-albuterol (DUONEB) 0.5-2.5 (3) MG/3ML nebulizer solution 3 mL, 3 mL, Inhalation, Q6H, Wieting, Richard, MD, 3 mL at 12/14/18 0733 .  MEDLINE mouth rinse, 15 mL, Mouth Rinse, q12n4p, Lance Coon, MD, 15 mL at 12/14/18 1224 .  metoprolol tartrate (LOPRESSOR) tablet 25 mg, 25 mg, Oral, BID, Lance Coon, MD, 25 mg at 12/14/18 1031 .  morphine 2 MG/ML injection 1 mg, 1 mg, Intravenous, Q4H PRN, Dustin Flock, MD, 1 mg at 12/13/18 2314 .  ondansetron (ZOFRAN) tablet 4 mg, 4 mg, Oral, Q6H PRN, 4 mg at 12/14/18 1030 **OR** ondansetron (ZOFRAN) injection 4 mg, 4 mg, Intravenous, Q6H PRN, Lance Coon, MD .  oxyCODONE (Oxy IR/ROXICODONE) immediate release tablet 10 mg, 10 mg, Oral, Q6H PRN, Lance Coon, MD, 10 mg at 12/08/18 1702 .  oxyCODONE (OXYCONTIN) 12 hr tablet 20 mg, 20 mg, Oral, Q12H, Lance Coon,  MD, 20 mg at 12/14/18 1031 .  pantoprazole (PROTONIX) EC tablet 40 mg, 40 mg, Oral, Daily, Lance Coon, MD, 40 mg at 12/14/18 1030 .  predniSONE (DELTASONE) tablet 60 mg, 60 mg, Oral, Q breakfast, Charlaine Dalton R, MD, 60 mg at 12/14/18 1030 .  protein supplement (ENSURE MAX) liquid, 11 oz, Oral, BID BM, Loletha Grayer, MD, 11 oz at 12/13/18 0900   Physical exam:  Vitals:   12/14/18 0203 12/14/18 0457 12/14/18 0733 12/14/18 0750  BP:  135/63  117/71  Pulse:  68  70  Resp:  18  18  Temp:  (!) 97.5 F (36.4 C)  97.6 F (36.4 C)  TempSrc:  Oral  Oral  SpO2: 99% 97% 95% 96%  Weight:      Height:       Physical Exam  Constitutional: No distress.  HENT:  Head: Normocephalic and atraumatic.  Neck: Normal range of motion.  Cardiovascular:  No murmur heard. Pulmonary/Chest: Effort normal.  Breathing via nasal cannula oxygen.  Abdominal: Soft. She exhibits no distension. There is no abdominal tenderness.  Musculoskeletal:        General: Edema present.  Neurological: She is alert.  Skin: Skin is warm.       CMP Latest Ref Rng & Units 12/14/2018  Glucose 70 - 99 mg/dL -  BUN 8 - 23 mg/dL -  Creatinine 0.44 - 1.00 mg/dL 0.59  Sodium 135 - 145 mmol/L -  Potassium 3.5 - 5.1 mmol/L -  Chloride 98 - 111 mmol/L -  CO2 22 - 32 mmol/L -  Calcium 8.9 - 10.3 mg/dL -  Total Protein 6.5 - 8.1 g/dL -  Total Bilirubin 0.3 - 1.2 mg/dL -  Alkaline Phos 38 - 126 U/L -  AST 15 - 41 U/L -  ALT 0 - 44 U/L -   CBC Latest Ref Rng & Units 12/09/2018  WBC 4.0 - 10.5 K/uL 18.3(H)  Hemoglobin 12.0 - 15.0 g/dL 8.0(L)  Hematocrit 36.0 - 46.0 % 27.3(L)  Platelets 150 - 400 K/uL 386   RADIOGRAPHIC STUDIES: I have personally reviewed the radiological images as listed and agreed with the findings in the report. Dg Chest 1 View  Result Date: 12/03/2018 CLINICAL DATA:  Fever. History of lung cancer in 2019. History of pneumonia, diabetes, former smoker. EXAM: CHEST  1 VIEW COMPARISON:  Chest  x-rays dated 11/29/2018 and  10/26/2018. Chest CT dated 11/29/2018. FINDINGS: Heart size and mediastinal contours appear stable. RIGHT chest wall Port-A-Cath appears stable in position with tip at the level of the mid SVC. Persistent near complete opacification of the LEFT hemithorax, with small residual aeration at the LEFT lung apex. Increased interstitial markings within the RIGHT lung suggesting developing edema. No pneumothorax seen. Osseous structures about the chest are unremarkable. IMPRESSION: Stable chest x-ray. Persistent near complete opacification of the LEFT hemithorax, compatible with the combination of cavitary LEFT upper lobe pulmonary lesion (known lung cancer) and the progressive interstitial and ill-defined airspace disease in the LEFT lower lobe better demonstrated on chest CT of 11/29/2018 with differential of metastatic progression versus superimposed pneumonia. Electronically Signed   By: Franki Cabot M.D.   On: 12/03/2018 13:55   Dg Chest 2 View  Result Date: 11/20/2018 CLINICAL DATA:  Shortness of breath.  Left lower extremity edema. EXAM: CHEST - 2 VIEW COMPARISON:  November 07, 2018 chest x-ray and chest CT FINDINGS: A right Port-A-Cath is stable. Infiltrate in the left lung has worsened in the interval. No other interval changes. IMPRESSION: Worsening infiltrate in the left lung suggesting the possibility of worsening pneumonia. Electronically Signed   By: Dorise Bullion III M.D   On: 11/20/2018 17:32   Ct Chest W Contrast  Result Date: 11/29/2018 CLINICAL DATA:  Shortness of breath and increasing oxygen requirements. EXAM: CT CHEST WITH CONTRAST TECHNIQUE: Multidetector CT imaging of the chest was performed during intravenous contrast administration. CONTRAST:  68mL OMNIPAQUE IOHEXOL 300 MG/ML  SOLN COMPARISON:  11/07/2018 FINDINGS: Cardiovascular: The heart size is normal. No substantial pericardial effusion. Coronary artery calcification is evident. Atherosclerotic calcification  is noted in the wall of the thoracic aorta. Right Port-A-Cath tip is positioned in the mid SVC. Main pulmonary arteries are enlarged. Mediastinum/Nodes: 8 mm short axis subcarinal lymph node evident. Precarinal lymph node measures up to 11 mm short axis. Multiple small nodes are seen in the prevascular space. There is no axillary lymphadenopathy. Lungs/Pleura: The central tracheobronchial airways are patent. Centrilobular and paraseptal emphysema evident. volume loss left hemithorax is similar to prior. Continued progression of interstitial and airspace disease in the left upper lobe surrounding the cavitary mass in this patient with known lung cancer.Interval progression of interstitial and irregular, ill-defined airspace disease in the left base with peripheral predominance. Areas of subpleural reticulation in the right lung are similar to prior. Upper Abdomen: Unremarkable Musculoskeletal: No worrisome lytic or sclerotic osseous abnormality. Old left rib fracture evident. IMPRESSION: 1. Volume loss left hemithorax with similar appearance of cavitary left upper lobe pulmonary lesion in this patient with known lung cancer. Since 11/07/2018, there is been progression of interstitial and ill-defined airspace disease in the left lower lobe which may represent metastatic progression or infectious etiology. 2. Borderline to mild mediastinal lymphadenopathy. Metastatic disease a concern. 3. Enlargement of the main pulmonary arteries compatible with pulmonary arterial hypertension. 4.  Aortic Atherosclerois (ICD10-170.0) 5.  Emphysema. ([id number]) Electronically Signed   By: Misty Stanley M.D.   On: 11/29/2018 18:55   Dg Chest Port 1 View  Result Date: 12/11/2018 CLINICAL DATA:  Shortness of breath. History of lung cancer. EXAM: PORTABLE CHEST 1 VIEW COMPARISON:  12/07/2018 FINDINGS: Right jugular Port-A-Cath terminates over the mid SVC. The cardiomediastinal silhouette is unchanged with persistent obscuration of  the left heart border. Lung volumes remain low. Diffuse bilateral interstitial and patchy airspace opacities are again seen involving the left greater than right lungs with slight interval worsening  including in the right lower lung. A left pleural effusion persists. No pneumothorax is identified. IMPRESSION: Slight worsening of bilateral airspace disease. Electronically Signed   By: Logan Bores M.D.   On: 12/11/2018 09:13   Dg Chest Port 1 View  Result Date: 12/07/2018 CLINICAL DATA:  Fever and respiratory distress EXAM: PORTABLE CHEST 1 VIEW COMPARISON:  12/03/2018 FINDINGS: Slightly decreased lung volumes noted with stable to slight increase in bilateral interstitial/airspace opacities. LEFT pleural effusion is again noted with LEFT hemithorax volume loss. A RIGHT IJ Port-A-Cath is again identified with tip overlying the mid SVC. No pneumothorax. IMPRESSION: Decreased lung volumes with stable to slight increase in diffuse bilateral interstitial/airspace opacities. No other significant change. Electronically Signed   By: Margarette Canada M.D.   On: 12/07/2018 15:16   Dg Chest Port 1 View  Result Date: 11/29/2018 CLINICAL DATA:  Shortness of breath.  Exposure to COVID-19. EXAM: PORTABLE CHEST 1 VIEW COMPARISON:  Chest x-rays dated 11/20/2018 and 11/07/2018 and chest CTs dated 11/07/2018 and 10/04/2018 FINDINGS: There is a persistent extensive infiltrate in the left lung superimposed on chronic changes including a cavitary lesion in the left midzone laterally. Overall heart size is normal. Aortic atherosclerosis. Power port in good position. Right lung is clear. No acute bone abnormality. IMPRESSION: No change in the appearance of the chest since the prior study of 11/20/2018. Persistent extensive infiltrate on the left superimposed on chronic changes demonstrated on the prior CT scans. Aortic Atherosclerosis (ICD10-I70.0). Electronically Signed   By: Lorriane Shire M.D.   On: 11/29/2018 16:06    Assessment  and plan-  Patient is a 63 y.o. female with history of stage IV squamous cell lung cancer currently admitted due to acute on chronic hypoxic respiratory failure.  #Acute on chronic respiratory failure, continue to have similar oxygen requirement. #Stage IV lung cancer, continue comfort care.  If able to be weaned off high flow nasal cannula oxygen, may go home with hospice. Continue current scope of care. Prednisone, pain regimen, anxiolytic.    Earlie Server, MD, PhD Hematology Oncology [facility] Pager- [phone] 12/14/2018

## 2018-12-15 LAB — GLUCOSE, CAPILLARY
Glucose-Capillary: 228 mg/dL — ABNORMAL HIGH (ref 70–99)
Glucose-Capillary: 250 mg/dL — ABNORMAL HIGH (ref 70–99)
Glucose-Capillary: 271 mg/dL — ABNORMAL HIGH (ref 70–99)
Glucose-Capillary: 343 mg/dL — ABNORMAL HIGH (ref 70–99)

## 2018-12-15 MED ORDER — INSULIN GLARGINE 100 UNIT/ML ~~LOC~~ SOLN
45.0000 [IU] | Freq: Every morning | SUBCUTANEOUS | Status: DC
  Administered 2018-12-16: 45 [IU] via SUBCUTANEOUS
  Filled 2018-12-15 (×2): qty 0.45

## 2018-12-15 MED ORDER — IPRATROPIUM-ALBUTEROL 0.5-2.5 (3) MG/3ML IN SOLN
3.0000 mL | Freq: Four times a day (QID) | RESPIRATORY_TRACT | Status: DC | PRN
  Administered 2018-12-16: 15:00:00 3 mL via RESPIRATORY_TRACT
  Filled 2018-12-15: qty 3

## 2018-12-15 MED ORDER — INSULIN GLARGINE 100 UNIT/ML ~~LOC~~ SOLN
9.0000 [IU] | Freq: Once | SUBCUTANEOUS | Status: AC
  Administered 2018-12-15: 9 [IU] via SUBCUTANEOUS
  Filled 2018-12-15: qty 0.09

## 2018-12-15 NOTE — Progress Notes (Signed)
Patient ID: Tamara Parrish, female   DOB: Jul 14, 1956, 63 y.o.   MRN: [id number]  [facility] PROGRESS NOTE  Mackena Plummer Ellingsen ZHY:[id number] DOB: June 11, 1956 DOA: 12/07/2018 PCP: Glendon Axe, MD  HPI/Subjective:  Patient's oxygen requirement is less compared to yesterday on 8 L today   Objective: Vitals:   12/15/18 0747 12/15/18 0808  BP:  (!) 143/58  Pulse:  88  Resp:    Temp:  (!) 97 F (36.1 C)  SpO2: 95% 92%    Intake/Output Summary (Last 24 hours) at 12/15/2018 0905 Last data filed at 12/14/2018 1918 Gross per 24 hour  Intake 360 ml  Output 1000 ml  Net -640 ml   Filed Weights   12/07/18 1359  Weight: 81.6 kg    ROS: Review of Systems  Constitutional: Positive for malaise/fatigue. Negative for chills and fever.  Eyes: Negative for blurred vision.  Respiratory: Positive for shortness of breath. Negative for cough and wheezing.   Cardiovascular: Negative for chest pain.  Gastrointestinal: Negative for abdominal pain, constipation, diarrhea, nausea and vomiting.  Genitourinary: Negative for dysuria.  Musculoskeletal: Negative for joint pain.  Neurological: Negative for dizziness and headaches.   Exam: Physical Exam  Constitutional: She is oriented to person, place, and time.  HENT:  Nose: No mucosal edema.  Mouth/Throat: No oropharyngeal exudate or posterior oropharyngeal edema.  Ritta Slot has resolved  Eyes: Pupils are equal, round, and reactive to light. Conjunctivae, EOM and lids are normal.  Neck: No JVD present. Carotid bruit is not present. No edema present. No thyroid mass and no thyromegaly present.  Cardiovascular: S1 normal and S2 normal. Exam reveals no gallop.  No murmur heard. Pulses:      Dorsalis pedis pulses are 2+ on the right side and 2+ on the left side.  Respiratory: No respiratory distress. She has decreased breath sounds in the right lower field and the left lower field. She has no wheezes. She has rhonchi in the right lower  field and the left lower field. She has no rales.  GI: Soft. Bowel sounds are normal. There is no abdominal tenderness.  Musculoskeletal:     Right ankle: She exhibits swelling.     Left ankle: She exhibits swelling.  Lymphadenopathy:    She has no cervical adenopathy.  Neurological: She is alert and oriented to person, place, and time. No cranial nerve deficit.  Skin: Skin is warm. Nails show no clubbing.  Redness on the buttock  Psychiatric: She has a normal mood and affect.      Data Reviewed: Basic Metabolic Panel: Recent Labs  Lab 12/08/18 1359 12/09/18 0320 12/10/18 0552 12/14/18 0615  NA  --  135 136  --   K 5.2* 5.5* 4.8  --   CL  --  107 108  --   CO2  --  22 23  --   GLUCOSE  --  231* 388*  --   BUN  --  50* 46*  --   CREATININE  --  1.12* 0.97 0.59  CALCIUM  --  8.5* 8.7*  --    Liver Function Tests: No results for input(s): AST, ALT, ALKPHOS, BILITOT, PROT, ALBUMIN in the last 168 hours. CBC: Recent Labs  Lab 12/09/18 0320  WBC 18.3*  HGB 8.0*  HCT 27.3*  MCV 99.3  PLT 386   Cardiac Enzymes: No results for input(s): CKTOTAL, CKMB, CKMBINDEX, TROPONINI in the last 168 hours. BNP (last 3 results) Recent Labs    11/20/18 1948 11/29/18 1559  BNP 65.0 142.0*    ProBNP (last 3 results) No results for input(s): PROBNP in the last 8760 hours.  CBG: Recent Labs  Lab 12/14/18 0751 12/14/18 1148 12/14/18 1647 12/14/18 2136 12/15/18 0811  GLUCAP 112* 145* 290* 337* 228*    Recent Results (from the past 240 hour(s))  Blood Culture (routine x 2)     Status: None   Collection Time: 12/07/18  2:14 PM  Result Value Ref Range Status   Specimen Description BLOOD LEFT ANTECUBITAL  Final   Special Requests   Final    BOTTLES DRAWN AEROBIC AND ANAEROBIC Blood Culture adequate volume   Culture   Final    NO GROWTH 5 DAYS Performed at [facility], [address] [place], [place] [address]    Report Status 12/12/2018 FINAL  Final  Blood  Culture (routine x 2)     Status: None   Collection Time: 12/07/18  2:14 PM  Result Value Ref Range Status   Specimen Description BLOOD RIGHT ANTECUBITAL  Final   Special Requests   Final    BOTTLES DRAWN AEROBIC AND ANAEROBIC Blood Culture adequate volume   Culture   Final    NO GROWTH 5 DAYS Performed at [facility], [address], [place], [place] [address]    Report Status 12/12/2018 FINAL  Final  SARS Coronavirus 2 [facility] order, Performed in [place] hospital lab)     Status: None   Collection Time: 12/07/18  2:14 PM  Result Value Ref Range Status   SARS Coronavirus 2 NEGATIVE NEGATIVE Final    Comment: (NOTE) If result is NEGATIVE SARS-CoV-2 target nucleic acids are NOT DETECTED. The SARS-CoV-2 RNA is generally detectable in upper and lower  respiratory specimens during the acute phase of infection. The lowest  concentration of SARS-CoV-2 viral copies this assay can detect is 250  copies / mL. A negative result does not preclude SARS-CoV-2 infection  and should not be used as the sole basis for treatment or other  patient management decisions.  A negative result may occur with  improper specimen collection / handling, submission of specimen other  than nasopharyngeal swab, presence of viral mutation(s) within the  areas targeted by this assay, and inadequate number of viral copies  (<250 copies / mL). A negative result must be combined with clinical  observations, patient history, and epidemiological information. If result is POSITIVE SARS-CoV-2 target nucleic acids are DETECTED. The SARS-CoV-2 RNA is generally detectable in upper and lower  respiratory specimens dur ing the acute phase of infection.  Positive  results are indicative of active infection with SARS-CoV-2.  Clinical  correlation with patient history and other diagnostic information is  necessary to determine patient infection status.  Positive results do  not rule out bacterial infection or  co-infection with other viruses. If result is PRESUMPTIVE POSTIVE SARS-CoV-2 nucleic acids MAY BE PRESENT.   A presumptive positive result was obtained on the submitted specimen  and confirmed on repeat testing.  While 2019 novel coronavirus  (SARS-CoV-2) nucleic acids may be present in the submitted sample  additional confirmatory testing may be necessary for epidemiological  and / or clinical management purposes  to differentiate between  SARS-CoV-2 and other Sarbecovirus currently known to infect humans.  If clinically indicated additional testing with an alternate test  methodology [id number]) is advised. The SARS-CoV-2 RNA is generally  detectable in upper and lower respiratory sp ecimens during the acute  phase of infection. The expected result is Negative.  Fact Sheet for Patients:  StrictlyIdeas.no Fact Sheet for Healthcare Providers: BankingDealers.co.za This test is not yet approved or cleared by the [place] FDA and has been authorized for detection and/or diagnosis of SARS-CoV-2 by FDA under an Emergency Use Authorization (EUA).  This EUA will remain in effect (meaning this test can be used) for the duration of the COVID-19 declaration under Section 564(b)(1) of the Act, 21 U.S.C. section 360bbb-3(b)(1), unless the authorization is terminated or revoked sooner. Performed at [facility], [place] [address], [place], [place] [address]   Urine culture     Status: None   Collection Time: 12/08/18  3:15 AM  Result Value Ref Range Status   Specimen Description   Final    URINE, RANDOM Performed at [facility], [address], [place], [place] [address]    Special Requests   Final    Immunocompromised Performed at [facility], [place] [place], [place] [address]    Culture   Final    NO GROWTH Performed at [facility], [place] [address], [place], [place] [address]    Report Status  12/09/2018 FINAL  Final     Studies: No results found.  Scheduled Meds: . ALPRAZolam  0.5 mg Oral Q4H  . amLODipine  5 mg Oral Daily  . aspirin  81 mg Oral Daily  . atorvastatin  40 mg Oral Daily  . budesonide (PULMICORT) nebulizer solution  0.5 mg Nebulization BID  . chlorhexidine  15 mL Mouth Rinse BID  . enoxaparin (LOVENOX) injection  40 mg Subcutaneous Q24H  . hydroxychloroquine  200 mg Oral BID  . insulin aspart  0-9 Units Subcutaneous TID AC & HS  . insulin glargine  36 Units Subcutaneous q morning - 10a  . ipratropium-albuterol  3 mL Inhalation Q6H  . mouth rinse  15 mL Mouth Rinse q12n4p  . metoprolol tartrate  25 mg Oral BID  . oxyCODONE  20 mg Oral Q12H  . pantoprazole  40 mg Oral Daily  . predniSONE  60 mg Oral Q breakfast  . Ensure Max Protein  11 oz Oral BID BM   Continuous Infusions: . sodium chloride Stopped (12/13/18 1500)    Assessment/Plan:  1. Acute on chronic hypoxic respiratory failure.  Once acceptable level of oxygen per hospice we can discharge patient 2. Clinical sepsis.  Status post treatment with antibiotic 3. thrush.  Status post treatment discontinue IV Diflucan 4. Stage IV cancer of the lung.  Any treatments at this point would be palliative in nature.  Dr. Tasia Catchings spoke with daughter Theadora Rama and recommended comfort care and hospice.  No significant improvement family wants patient to go to home with hospice however this is impossible with high flow oxygen of oxygen patient will remain in the hospital 5. rheumatoid arthritis on Plaquenil 6. Type 2 diabetes on glargine insulin and sliding scale.  Blood sugar labile continue current regimen 7. Hyperkalemia.  Now resolved  8. hyperlipidemia unspecified on Lipitor 9. GERD on Protonix  Code Status:     Code Status Orders  (From admission, onward)         Start     Ordered   12/07/18 2346  Full code  Continuous     12/07/18 2345        Code Status History    Date Active Date Inactive Code  Status Order ID Comments User Context   11/29/2018 2250 12/04/2018 1628 Full Code [id number]  Lance Coon, MD Inpatient  11/20/2018 2045 11/22/2018 1806 Full Code [id number]  Loletha Grayer, MD ED   10/26/2018 2037 10/28/2018 2050 Partial Code [id number]  Hillary Bow, MD Inpatient   10/26/2018 1728 10/26/2018 2037 DNR [id number]  Hillary Bow, MD ED   02/21/2018 2004 02/24/2018 1542 Full Code [id number]  Gladstone Lighter, MD ED   02/07/2018 2202 02/11/2018 1738 Full Code [id number]  Dustin Flock, MD Inpatient     Family Communication:  Disposition Plan: The patient will go to [name]'s house upon discharge  Antibiotics:  Cefepime  Diflucan  Time spent: 28 minutes  Ryleigh Buenger [facility]

## 2018-12-15 NOTE — Progress Notes (Signed)
Nutrition Follow-up  RD working remotely.  DOCUMENTATION CODES:   Not applicable  INTERVENTION:  Continue Ensure Max Protein po BID, each supplement provides 150 kcal and 30 grams of protein.  NUTRITION DIAGNOSIS:   Increased nutrient needs related to catabolic illness(stage IV lung cancer) as evidenced by estimated needs.  Ongoing.  GOAL:   Patient will meet greater than or equal to 90% of their needs  Progressing.  MONITOR:   PO intake, Supplement acceptance, Labs, Weight trends, Skin, I & O's  REASON FOR ASSESSMENT:   Malnutrition Screening Tool    ASSESSMENT:   63 year old female with PMHx of HLD, HTN, osteomyelitis, RA, DM type 2, stage IV lung cancer admitted with acute on chronic respiratory failure, clinical sepsis, thrush.  Per chart patient eating about 50-75 % of meals. She did eat 100% of breakfast yesterday morning. Per chart she had been drinking 1 bottle of Ensure Max Protein per day but did not have any yesterday. Plan is for discharge home with hospice once oxygen can be weaned.  Medications reviewed and include: Xanax 0.5 mg Q4hrs, Novolog 0-9 units TID, Lantus 45 units daily, pantoprazole, prednisone 60 mg daily.  Labs reviewed: CBG 228-337.  Diet Order:   Diet Order            Diet Carb Modified Fluid consistency: Thin; Room service appropriate? Yes  Diet effective now             EDUCATION NEEDS:   Not appropriate for education at this time  Skin:  Skin Assessment: Skin Integrity Issues:(stg II ischial tuberosity)  Last BM:  12/15/2018 - large type 4  Height:   Ht Readings from Last 1 Encounters:  12/07/18 5\' 6"  (1.676 m)   Weight:   Wt Readings from Last 1 Encounters:  12/07/18 81.6 kg   Ideal Body Weight:  59.1 kg  BMI:  Body mass index is 29.05 kg/m.  Estimated Nutritional Needs:   Kcal:  1800-2100  Protein:  95-105 grams  Fluid:  1.8-2.1 L/day  Willey Blade, MS, RD, LDN Office: [phone] Pager:  [phone] After Hours/Weekend Pager: [phone]

## 2018-12-16 LAB — GLUCOSE, CAPILLARY
Glucose-Capillary: 169 mg/dL — ABNORMAL HIGH (ref 70–99)
Glucose-Capillary: 179 mg/dL — ABNORMAL HIGH (ref 70–99)
Glucose-Capillary: 167 mg/dL — ABNORMAL HIGH (ref 70–99)
Glucose-Capillary: 202 mg/dL — ABNORMAL HIGH (ref 70–99)
Glucose-Capillary: 380 mg/dL — ABNORMAL HIGH (ref 70–99)

## 2018-12-16 MED ORDER — LORAZEPAM 2 MG/ML IJ SOLN
1.0000 mg | Freq: Once | INTRAMUSCULAR | Status: AC
  Administered 2018-12-17: 1 mg via INTRAVENOUS
  Filled 2018-12-16: qty 1

## 2018-12-16 NOTE — Progress Notes (Signed)
Patient ID: Tamara Parrish, female   DOB: 1956/08/04, 63 y.o.   MRN: [id number]  [facility] PROGRESS NOTE  Remmie Bembenek Canter WJX:[id number] DOB: Jun 05, 1956 DOA: 12/07/2018 PCP: Glendon Axe, MD  HPI/Subjective:  Patient's oxygen requirement little better today she is on 7 L of oxygen   Objective: Vitals:   12/16/18 0726 12/16/18 0940  BP:  134/66  Pulse:  68  Resp:    Temp:    SpO2: 95% 96%    Intake/Output Summary (Last 24 hours) at 12/16/2018 1017 Last data filed at 12/16/2018 0146 Gross per 24 hour  Intake 240 ml  Output 1750 ml  Net -1510 ml   Filed Weights   12/07/18 1359  Weight: 81.6 kg    ROS: Review of Systems  Constitutional: Positive for malaise/fatigue. Negative for chills and fever.  Eyes: Negative for blurred vision.  Respiratory: Positive for shortness of breath. Negative for cough and wheezing.   Cardiovascular: Negative for chest pain.  Gastrointestinal: Negative for abdominal pain, constipation, diarrhea, nausea and vomiting.  Genitourinary: Negative for dysuria.  Musculoskeletal: Negative for joint pain.  Neurological: Negative for dizziness and headaches.   Exam: Physical Exam  Constitutional: She is oriented to person, place, and time.  HENT:  Nose: No mucosal edema.  Mouth/Throat: No oropharyngeal exudate or posterior oropharyngeal edema.  Ritta Slot has resolved  Eyes: Pupils are equal, round, and reactive to light. Conjunctivae, EOM and lids are normal.  Neck: No JVD present. Carotid bruit is not present. No edema present. No thyroid mass and no thyromegaly present.  Cardiovascular: S1 normal and S2 normal. Exam reveals no gallop.  No murmur heard. Pulses:      Dorsalis pedis pulses are 2+ on the right side and 2+ on the left side.  Respiratory: No respiratory distress. She has decreased breath sounds in the right lower field and the left lower field. She has no wheezes. She has rhonchi in the right lower field and the left lower  field. She has no rales.  GI: Soft. Bowel sounds are normal. There is no abdominal tenderness.  Musculoskeletal:     Right ankle: She exhibits swelling.     Left ankle: She exhibits swelling.  Lymphadenopathy:    She has no cervical adenopathy.  Neurological: She is alert and oriented to person, place, and time. No cranial nerve deficit.  Skin: Skin is warm. Nails show no clubbing.  Redness on the buttock  Psychiatric: She has a normal mood and affect.      Data Reviewed: Basic Metabolic Panel: Recent Labs  Lab 12/10/18 0552 12/14/18 0615  NA 136  --   K 4.8  --   CL 108  --   CO2 23  --   GLUCOSE 388*  --   BUN 46*  --   CREATININE 0.97 0.59  CALCIUM 8.7*  --    Liver Function Tests: No results for input(s): AST, ALT, ALKPHOS, BILITOT, PROT, ALBUMIN in the last 168 hours. CBC: No results for input(s): WBC, NEUTROABS, HGB, HCT, MCV, PLT in the last 168 hours. Cardiac Enzymes: No results for input(s): CKTOTAL, CKMB, CKMBINDEX, TROPONINI in the last 168 hours. BNP (last 3 results) Recent Labs    11/20/18 1948 11/29/18 1559  BNP 65.0 142.0*    ProBNP (last 3 results) No results for input(s): PROBNP in the last 8760 hours.  CBG: Recent Labs  Lab 12/15/18 1145 12/15/18 1650 12/15/18 2116 12/16/18 0833 12/16/18 0842  GLUCAP 250* 271* 343* 169* 179*  Recent Results (from the past 240 hour(s))  Blood Culture (routine x 2)     Status: None   Collection Time: 12/07/18  2:14 PM  Result Value Ref Range Status   Specimen Description BLOOD LEFT ANTECUBITAL  Final   Special Requests   Final    BOTTLES DRAWN AEROBIC AND ANAEROBIC Blood Culture adequate volume   Culture   Final    NO GROWTH 5 DAYS Performed at [facility], [address], [place], [place] [address]    Report Status 12/12/2018 FINAL  Final  Blood Culture (routine x 2)     Status: None   Collection Time: 12/07/18  2:14 PM  Result Value Ref Range Status   Specimen Description BLOOD  RIGHT ANTECUBITAL  Final   Special Requests   Final    BOTTLES DRAWN AEROBIC AND ANAEROBIC Blood Culture adequate volume   Culture   Final    NO GROWTH 5 DAYS Performed at [facility], [address], [place], [place] [address]    Report Status 12/12/2018 FINAL  Final  SARS Coronavirus 2 [facility] order, Performed in [place] hospital lab)     Status: None   Collection Time: 12/07/18  2:14 PM  Result Value Ref Range Status   SARS Coronavirus 2 NEGATIVE NEGATIVE Final    Comment: (NOTE) If result is NEGATIVE SARS-CoV-2 target nucleic acids are NOT DETECTED. The SARS-CoV-2 RNA is generally detectable in upper and lower  respiratory specimens during the acute phase of infection. The lowest  concentration of SARS-CoV-2 viral copies this assay can detect is 250  copies / mL. A negative result does not preclude SARS-CoV-2 infection  and should not be used as the sole basis for treatment or other  patient management decisions.  A negative result may occur with  improper specimen collection / handling, submission of specimen other  than nasopharyngeal swab, presence of viral mutation(s) within the  areas targeted by this assay, and inadequate number of viral copies  (<250 copies / mL). A negative result must be combined with clinical  observations, patient history, and epidemiological information. If result is POSITIVE SARS-CoV-2 target nucleic acids are DETECTED. The SARS-CoV-2 RNA is generally detectable in upper and lower  respiratory specimens dur ing the acute phase of infection.  Positive  results are indicative of active infection with SARS-CoV-2.  Clinical  correlation with patient history and other diagnostic information is  necessary to determine patient infection status.  Positive results do  not rule out bacterial infection or co-infection with other viruses. If result is PRESUMPTIVE POSTIVE SARS-CoV-2 nucleic acids MAY BE PRESENT.   A presumptive positive  result was obtained on the submitted specimen  and confirmed on repeat testing.  While 2019 novel coronavirus  (SARS-CoV-2) nucleic acids may be present in the submitted sample  additional confirmatory testing may be necessary for epidemiological  and / or clinical management purposes  to differentiate between  SARS-CoV-2 and other Sarbecovirus currently known to infect humans.  If clinically indicated additional testing with an alternate test  methodology [phone]) is advised. The SARS-CoV-2 RNA is generally  detectable in upper and lower respiratory sp ecimens during the acute  phase of infection. The expected result is Negative. Fact Sheet for Patients:  StrictlyIdeas.no Fact Sheet for Healthcare Providers: BankingDealers.co.za This test is not yet approved or cleared by the [place] FDA and has been authorized for detection and/or diagnosis of SARS-CoV-2 by FDA under an Emergency Use Authorization (EUA).  This EUA  will remain in effect (meaning this test can be used) for the duration of the COVID-19 declaration under Section 564(b)(1) of the Act, 21 U.S.C. section 360bbb-3(b)(1), unless the authorization is terminated or revoked sooner. Performed at [facility], [place] [address] [place], [place] [address]   Urine culture     Status: None   Collection Time: 12/08/18  3:15 AM  Result Value Ref Range Status   Specimen Description   Final    URINE, RANDOM Performed at [facility], [address], [place], [place] [address]    Special Requests   Final    Immunocompromised Performed at [facility], [address] [place], [place] [address]    Culture   Final    NO GROWTH Performed at [facility], [place] [address], [place], [place] [address]    Report Status 12/09/2018 FINAL  Final     Studies: No results found.  Scheduled Meds: . ALPRAZolam  0.5 mg Oral Q4H  . amLODipine  5 mg Oral Daily   . aspirin  81 mg Oral Daily  . atorvastatin  40 mg Oral Daily  . budesonide (PULMICORT) nebulizer solution  0.5 mg Nebulization BID  . chlorhexidine  15 mL Mouth Rinse BID  . enoxaparin (LOVENOX) injection  40 mg Subcutaneous Q24H  . hydroxychloroquine  200 mg Oral BID  . insulin aspart  0-9 Units Subcutaneous TID AC & HS  . insulin glargine  45 Units Subcutaneous q morning - 10a  . mouth rinse  15 mL Mouth Rinse q12n4p  . metoprolol tartrate  25 mg Oral BID  . oxyCODONE  20 mg Oral Q12H  . pantoprazole  40 mg Oral Daily  . predniSONE  60 mg Oral Q breakfast  . Ensure Max Protein  11 oz Oral BID BM   Continuous Infusions: . sodium chloride Stopped (12/13/18 1500)    Assessment/Plan:  1. Acute on chronic hypoxic respiratory failure.  I have been told oxygen level has to be 6 or less prior to discharge 2. clinical sepsis.  Status post treatment with antibiotic 3. thrush.  Status post treatment discontinue IV Diflucan 4. Stage IV cancer of the lung.  Any treatments at this point would be palliative in nature.  Dr. Tasia Catchings spoke with daughter Theadora Rama and recommended comfort care and hospice.  No significant improvement family wants patient to go to home with hospice  5. rheumatoid arthritis on Plaquenil 6. Type 2 diabetes on glargine insulin and sliding scale.  Blood sugar labile continue current regimen 7. Hyperkalemia.  Now resolved  8. hyperlipidemia unspecified on Lipitor 9. GERD on Protonix  Code Status:     Code Status Orders  (From admission, onward)         Start     Ordered   12/07/18 2346  Full code  Continuous     12/07/18 2345        Code Status History    Date Active Date Inactive Code Status Order ID Comments User Context   11/29/2018 2250 12/04/2018 1628 Full Code [id number]  Lance Coon, MD Inpatient   11/20/2018 2045 11/22/2018 1806 Full Code [id number]  Loletha Grayer, MD ED   10/26/2018 2037 10/28/2018 2050 Partial Code [id number]  Hillary Bow, MD Inpatient    10/26/2018 1728 10/26/2018 2037 DNR [id number]  Hillary Bow, MD ED   02/21/2018 2004 02/24/2018 1542 Full Code [id number]  Gladstone Lighter, MD ED   02/07/2018 2202 02/11/2018 1738 Full Code [id number]  Posey Pronto,  Irvine Glorioso, MD Inpatient     Family Communication:  Disposition Plan: The patient will go to [name]'s house upon discharge  Antibiotics:  Cefepime  Diflucan  Time spent: 28 minutes  [place]

## 2018-12-16 NOTE — TOC Progression Note (Signed)
Transition of Care [facility]) - Progression Note    Patient Details  Name: Kaili Castille MRN: [id number] Date of Birth: [birth year]  Transition of Care [facility]) CM/SW [place], [place] Phone Number: 12/16/2018, 3:37 PM  Clinical Narrative:   CSW spoke with Bevely Palmer at [facility] regarding patient. Bevely Palmer states that patient's equipment has been delivered and patient can discharge when ready. If patient is ready over the weekend, the contact person would be Venia Carbon [phone]. CSW will continue to follow for discharge planning.     Expected Discharge Plan: Home w Hospice Care Barriers to Discharge: Continued Medical Work up  Expected Discharge Plan and Services Expected Discharge Plan: [place] In-house Referral: Hospice / [place] Acute Care Choice: Hospice Living arrangements for the past 2 months: Single Family Home                 DME Arranged: Oxygen, Hospital bed DME Agency: [facility] [place] [facility] [place]: [facility] [place]         Social Determinants of Health (SDOH) Interventions    Readmission Risk Interventions Readmission Risk Prevention Plan 12/08/2018 12/01/2018  Transportation Screening Complete Complete  Medication Review Press photographer) Complete Complete  PCP or Specialist appointment within 3-5 days of discharge Complete Complete  HRI or [place] Complete Complete  SW Recovery Care/Counseling Consult Complete Complete  Palliative Care Screening Complete Not [place] Not Applicable Not Applicable  Some recent data might be hidden

## 2018-12-16 NOTE — Plan of Care (Signed)
Pt exhibit increased SOB and increased anxiety requiring intervention following breathing treat ment  both 4/22 and 4/23. Per day shift off going RN report pt exhibited same increase in SOB and anxiety following administration of breathing treatment on day shift. MD notified and duoneb d/c'd with new treatment ordered see mar. Daughter Audelia Acton called. Stated that she wishes to be update via MD in a.m. and stated that "only her sister " was being contacted and that was "wrong" because of "sister riveraly" she "just wanted to know about her mom." Explained that mother was alert and oriented that MD would review POC with mother and that I would share wishes to be contacted by MD. Placed request on physician note in summary.

## 2018-12-16 NOTE — Progress Notes (Signed)
   12/16/18 1300  Clinical Encounter Type  Visited With Patient and family together  Visit Type Follow-up  Ch was rounding. Pt was on the phone with her daughter Theadora Rama. Theadora Rama said pt was doing a lot better than Monday. Ch left to give space to the pt.

## 2018-12-17 DIAGNOSIS — Z87891 Personal history of nicotine dependence: Secondary | ICD-10-CM

## 2018-12-17 LAB — GLUCOSE, CAPILLARY
Glucose-Capillary: 173 mg/dL — ABNORMAL HIGH (ref 70–99)
Glucose-Capillary: 131 mg/dL — ABNORMAL HIGH (ref 70–99)
Glucose-Capillary: 144 mg/dL — ABNORMAL HIGH (ref 70–99)
Glucose-Capillary: 305 mg/dL — ABNORMAL HIGH (ref 70–99)

## 2018-12-17 MED ORDER — INSULIN GLARGINE 100 UNIT/ML ~~LOC~~ SOLN
50.0000 [IU] | Freq: Every morning | SUBCUTANEOUS | Status: DC
  Administered 2018-12-17 – 2018-12-18 (×2): 50 [IU] via SUBCUTANEOUS
  Filled 2018-12-17 (×3): qty 0.5

## 2018-12-17 MED ORDER — SODIUM CHLORIDE 0.9% FLUSH
10.0000 mL | INTRAVENOUS | Status: AC | PRN
  Administered 2018-12-17: 09:00:00 10 mL

## 2018-12-17 NOTE — Progress Notes (Signed)
Generalized weakness.  SOB on minimal exertion. High flow 02 weaned to 6 l/[place]. Insuling adjusted with POCT glucose better controlled.  Resting quietly; napping at intervals. Continuing xanax every 4 hours po for anxiety/panic attacks. Family updated by TC.

## 2018-12-17 NOTE — Progress Notes (Signed)
Hematology/Oncology Progress Note [facility] [phone] [phone]  Patient Care Team: Glendon Axe, MD as PCP - General (Internal Medicine) Telford Nab, RN as Registered Nurse   Name of the patient: Tamara Parrish  [id number]  Jun 15, 1956  Date of visit: 12/17/18   INTERVAL HISTORY-  Patient was seen and evaluated at bedside.  Reports "breathing is fine"" I want to go home". "Feels cold"  On 6 L of nasal cannula oxygen.  Review of systems- Review of Systems  Unable to perform ROS: Mental status change  Constitutional: Positive for fatigue.  Respiratory: Positive for shortness of breath.   Cardiovascular: Positive for leg swelling.  Gastrointestinal: Negative for abdominal distention and abdominal pain.  Genitourinary: Negative for difficulty urinating.   Musculoskeletal: Positive for arthralgias.  Neurological: Negative for dizziness.  Psychiatric/Behavioral: Negative for confusion.    Allergies  Allergen Reactions  . Ibuprofen Other (See Comments)    Is not supposed to take because of her kidneys.  . Penicillins Other (See Comments)    "welts" as a child (unknown age) Has patient had a PCN reaction causing immediate rash, facial/tongue/throat swelling, SOB or lightheadedness with hypotension: No Has patient had a PCN reaction causing severe rash involving mucus membranes or skin necrosis: No Has patient had a PCN reaction that required hospitalization: No Has patient had a PCN reaction occurring within the last 10 years: No If all of the above answers are "NO", then may proceed with Cephalosporin use.    Patient Active Problem List   Diagnosis Date Noted  . Respiratory distress   . SOB (shortness of breath)   . Pressure injury of skin 12/09/2018  . Cancer of upper lobe of left lung ([place]) 12/08/2018  . Sepsis ([place]) 11/29/2018  . Suspected Covid-19 Virus Infection 11/29/2018  . Palliative care encounter   . HCAP  (healthcare-associated pneumonia) 11/20/2018  . Dehydration 11/02/2018  . Anemia 10/26/2018  . Encounter for antineoplastic chemotherapy 07/12/2018  . Rheumatoid arthritis ([place]) 07/12/2018  . Thrombocytosis ([place]) 07/12/2018  . Goals of care, counseling/discussion 05/25/2018  . Malignant neoplasm of lung ([place]) 05/25/2018  . Recurrent pneumonia 02/21/2018  . PNA (pneumonia) 02/07/2018  . Elevated erythrocyte sedimentation rate 12/16/2017  . Dry cough 08/19/2017  . Rheumatoid nodule of multiple sites ([place]) 08/19/2017  . Polyneuropathy associated with underlying disease ([place]) 05/17/2017  . Seasonal allergic rhinitis due to pollen 05/17/2017  . Essential hypertension 10/09/2016  . Pure hypercholesterolemia 10/09/2016  . Rheumatoid arthritis, seropositive ([place]) 03/27/2016  . Polyarthralgia 03/18/2016  . Diabetes mellitus type 2, uncomplicated ([place]) 66/01/3015  . Allergic rhinitis 05/18/2014  . Microalbuminuria 04/18/2014     Past Medical History:  Diagnosis Date  . Cancer ([place])    lung ca DX 2019  . Collagen vascular disease (HCC)    RA  . DM2 (diabetes mellitus, type 2) ([place])   . Dyspnea   . Hyperlipemia   . Hypertension   . Osteomyelitis ([place])   . Pneumonia   . RA (rheumatoid arthritis) ([place])   . Rheumatoid arthritis [facility])      Past Surgical History:  Procedure Laterality Date  . ENDOBRONCHIAL ULTRASOUND N/A 05/13/2018   Procedure: ENDOBRONCHIAL ULTRASOUND;  Surgeon: [name], MD;  Location: [facility] ORS;  Service: Pulmonary;  Laterality: N/A;  . PORTA CATH INSERTION N/A 05/27/2018   Procedure: PORTA CATH INSERTION;  Surgeon: Katha Cabal, MD;  Location: [place] [facility];  Service: Cardiovascular;  Laterality: N/A;  . TOE SURGERY  Social History   Socioeconomic History  . Marital status: Single    Spouse name: Not on file  . Number of children: 2  . Years of education: Not on file  . Highest education level: Not on file  Occupational History   . Not on file  Social Needs  . Financial resource strain: Not very hard  . Food insecurity:    Worry: Never true    Inability: Never true  . Transportation needs:    Medical: No    Non-medical: No  Tobacco Use  . Smoking status: Former Smoker    Packs/day: 1.00    Years: 40.00    Pack years: 40.00    Types: Cigarettes    Last attempt to quit: 11/22/2017    Years since quitting: 1.0  . Smokeless tobacco: Never Used  . Tobacco comment: quit 2 weeks ago  Substance and Sexual Activity  . Alcohol use: Never    Frequency: Never  . Drug use: Never  . Sexual activity: Not Currently  Lifestyle  . Physical activity:    Days per week: 0 days    Minutes per session: Not on file  . Stress: To some extent  Relationships  . Social connections:    Talks on phone: Three times a week    Gets together: More than three times a week    Attends religious service: Not on file    Active member of club or organization: No    Attends meetings of clubs or organizations: Never    Relationship status: Never married  . Intimate partner violence:    Fear of current or ex partner: No    Emotionally abused: No    Physically abused: No    Forced sexual activity: No  Other Topics Concern  . Not on file  Social History Narrative   Living at home with daughter.  Ambulates with a walker at baseline.     Family History  Problem Relation Age of Onset  . Diabetes Mother   . Lung cancer Father      Current Facility-Administered Medications:  .  0.9 %  sodium chloride infusion, , Intravenous, PRN, Loletha Grayer, MD, Stopped at 12/13/18 1500 .  acetaminophen (TYLENOL) tablet 650 mg, 650 mg, Oral, Q6H PRN, 650 mg at 12/08/18 0133 **OR** acetaminophen (TYLENOL) suppository 650 mg, 650 mg, Rectal, Q6H PRN, Lance Coon, MD .  ALPRAZolam Duanne Moron) tablet 0.5 mg, 0.5 mg, Oral, Q4H, Dustin Flock, MD, 0.5 mg at 12/17/18 2048 .  amLODipine (NORVASC) tablet 5 mg, 5 mg, Oral, Daily, Lance Coon, MD, 5 mg  at 12/17/18 0851 .  aspirin chewable tablet 81 mg, 81 mg, Oral, Daily, Lance Coon, MD, 81 mg at 12/17/18 0853 .  atorvastatin (LIPITOR) tablet 40 mg, 40 mg, Oral, Daily, Lance Coon, MD, 40 mg at 12/17/18 0851 .  budesonide (PULMICORT) nebulizer solution 0.5 mg, 0.5 mg, Nebulization, BID, Leslye Peer, Richard, MD, 0.5 mg at 12/17/18 1940 .  calcium carbonate (TUMS - dosed in mg elemental calcium) chewable tablet 200 mg of elemental calcium, 1 tablet, Oral, TID PRN, W[place], Richard, MD .  chlorhexidine (PERIDEX) 0.12 % solution 15 mL, 15 mL, Mouth Rinse, BID, Lance Coon, MD, 15 mL at 12/17/18 2048 .  enoxaparin (LOVENOX) injection 40 mg, 40 mg, Subcutaneous, Q24H, Lance Coon, MD, 40 mg at 12/17/18 2048 .  hydroxychloroquine (PLAQUENIL) tablet 200 mg, 200 mg, Oral, BID, Lance Coon, MD, 200 mg at 12/17/18 2048 .  insulin aspart (novoLOG) injection 0-9 Units,  0-9 Units, Subcutaneous, TID AC & HS, Lance Coon, MD, 1 Units at 12/17/18 1730 .  insulin glargine (LANTUS) injection 50 Units, 50 Units, Subcutaneous, q morning - 10a, Epifanio Lesches, MD, 50 Units at 12/17/18 1021 .  ipratropium-albuterol (DUONEB) 0.5-2.5 (3) MG/3ML nebulizer solution 3 mL, 3 mL, Inhalation, Q6H PRN, Mayo, Pete Pelt, MD, 3 mL at 12/16/18 1510 .  MEDLINE mouth rinse, 15 mL, Mouth Rinse, q12n4p, Lance Coon, MD, 15 mL at 12/17/18 1731 .  metoprolol tartrate (LOPRESSOR) tablet 25 mg, 25 mg, Oral, BID, Lance Coon, MD, 25 mg at 12/17/18 2053 .  morphine 2 MG/ML injection 1 mg, 1 mg, Intravenous, Q4H PRN, Dustin Flock, MD, 1 mg at 12/14/18 1952 .  ondansetron (ZOFRAN) tablet 4 mg, 4 mg, Oral, Q6H PRN, 4 mg at 12/14/18 1030 **OR** ondansetron (ZOFRAN) injection 4 mg, 4 mg, Intravenous, Q6H PRN, Lance Coon, MD .  oxyCODONE (Oxy IR/ROXICODONE) immediate release tablet 10 mg, 10 mg, Oral, Q6H PRN, Lance Coon, MD, 10 mg at 12/16/18 1521 .  oxyCODONE (OXYCONTIN) 12 hr tablet 20 mg, 20 mg, Oral, Q12H, Lance Coon, MD, 20 mg at 12/17/18 2048 .  pantoprazole (PROTONIX) EC tablet 40 mg, 40 mg, Oral, Daily, Lance Coon, MD, 40 mg at 12/17/18 0853 .  predniSONE (DELTASONE) tablet 60 mg, 60 mg, Oral, Q breakfast, Charlaine Dalton R, MD, 60 mg at 12/17/18 0852 .  protein supplement (ENSURE MAX) liquid, 11 oz, Oral, BID BM, Loletha Grayer, MD, 11 oz at 12/17/18 0851   Physical exam:  Vitals:   12/17/18 1733 12/17/18 1927 12/17/18 1946 12/17/18 2052  BP:  (!) 144/73  131/67  Pulse:  78  84  Resp:  (!) 28    Temp:  97.9 F (36.6 C)    TempSrc:  Oral    SpO2: (!) 5% 96% 96%   Weight:      Height:       Physical Exam  Constitutional: No distress.  HENT:  Head: Normocephalic and atraumatic.  Neck: Normal range of motion.  Cardiovascular:  No murmur heard. Pulmonary/Chest: Effort normal.  Breathing via nasal cannula oxygen.  Abdominal: Soft. She exhibits no distension. There is no abdominal tenderness.  Musculoskeletal:        General: Edema present.  Neurological: She is alert.  Skin: Skin is warm.       CMP Latest Ref Rng & Units 12/14/2018  Glucose 70 - 99 mg/dL -  BUN 8 - 23 mg/dL -  Creatinine 0.44 - 1.00 mg/dL 0.59  Sodium 135 - 145 mmol/L -  Potassium 3.5 - 5.1 mmol/L -  Chloride 98 - 111 mmol/L -  CO2 22 - 32 mmol/L -  Calcium 8.9 - 10.3 mg/dL -  Total Protein 6.5 - 8.1 g/dL -  Total Bilirubin 0.3 - 1.2 mg/dL -  Alkaline Phos 38 - 126 U/L -  AST 15 - 41 U/L -  ALT 0 - 44 U/L -   CBC Latest Ref Rng & Units 12/09/2018  WBC 4.0 - 10.5 K/uL 18.3(H)  Hemoglobin 12.0 - 15.0 g/dL 8.0(L)  Hematocrit 36.0 - 46.0 % 27.3(L)  Platelets 150 - 400 K/uL 386   RADIOGRAPHIC STUDIES: I have personally reviewed the radiological images as listed and agreed with the findings in the report. Dg Chest 1 View  Result Date: 12/03/2018 CLINICAL DATA:  Fever. History of lung cancer in 2019. History of pneumonia, diabetes, former smoker. EXAM: CHEST  1 VIEW COMPARISON:  Chest x-rays  dated 11/29/2018  and 10/26/2018. Chest CT dated 11/29/2018. FINDINGS: Heart size and mediastinal contours appear stable. RIGHT chest wall Port-A-Cath appears stable in position with tip at the level of the mid SVC. Persistent near complete opacification of the LEFT hemithorax, with small residual aeration at the LEFT lung apex. Increased interstitial markings within the RIGHT lung suggesting developing edema. No pneumothorax seen. Osseous structures about the chest are unremarkable. IMPRESSION: Stable chest x-ray. Persistent near complete opacification of the LEFT hemithorax, compatible with the combination of cavitary LEFT upper lobe pulmonary lesion (known lung cancer) and the progressive interstitial and ill-defined airspace disease in the LEFT lower lobe better demonstrated on chest CT of 11/29/2018 with differential of metastatic progression versus superimposed pneumonia. Electronically Signed   By: Franki Cabot M.D.   On: 12/03/2018 13:55   Dg Chest 2 View  Result Date: 11/20/2018 CLINICAL DATA:  Shortness of breath.  Left lower extremity edema. EXAM: CHEST - 2 VIEW COMPARISON:  November 07, 2018 chest x-ray and chest CT FINDINGS: A right Port-A-Cath is stable. Infiltrate in the left lung has worsened in the interval. No other interval changes. IMPRESSION: Worsening infiltrate in the left lung suggesting the possibility of worsening pneumonia. Electronically Signed   By: Dorise Bullion III M.D   On: 11/20/2018 17:32   Ct Chest W Contrast  Result Date: 11/29/2018 CLINICAL DATA:  Shortness of breath and increasing oxygen requirements. EXAM: CT CHEST WITH CONTRAST TECHNIQUE: Multidetector CT imaging of the chest was performed during intravenous contrast administration. CONTRAST:  56mL OMNIPAQUE IOHEXOL 300 MG/ML  SOLN COMPARISON:  11/07/2018 FINDINGS: Cardiovascular: The heart size is normal. No substantial pericardial effusion. Coronary artery calcification is evident. Atherosclerotic calcification is  noted in the wall of the thoracic aorta. Right Port-A-Cath tip is positioned in the mid SVC. Main pulmonary arteries are enlarged. Mediastinum/Nodes: 8 mm short axis subcarinal lymph node evident. Precarinal lymph node measures up to 11 mm short axis. Multiple small nodes are seen in the prevascular space. There is no axillary lymphadenopathy. Lungs/Pleura: The central tracheobronchial airways are patent. Centrilobular and paraseptal emphysema evident. volume loss left hemithorax is similar to prior. Continued progression of interstitial and airspace disease in the left upper lobe surrounding the cavitary mass in this patient with known lung cancer.Interval progression of interstitial and irregular, ill-defined airspace disease in the left base with peripheral predominance. Areas of subpleural reticulation in the right lung are similar to prior. Upper Abdomen: Unremarkable Musculoskeletal: No worrisome lytic or sclerotic osseous abnormality. Old left rib fracture evident. IMPRESSION: 1. Volume loss left hemithorax with similar appearance of cavitary left upper lobe pulmonary lesion in this patient with known lung cancer. Since 11/07/2018, there is been progression of interstitial and ill-defined airspace disease in the left lower lobe which may represent metastatic progression or infectious etiology. 2. Borderline to mild mediastinal lymphadenopathy. Metastatic disease a concern. 3. Enlargement of the main pulmonary arteries compatible with pulmonary arterial hypertension. 4.  Aortic Atherosclerois (ICD10-170.0) 5.  Emphysema. (OVF64-P32.9) Electronically Signed   By: Misty Stanley M.D.   On: 11/29/2018 18:55   Dg Chest Port 1 View  Result Date: 12/11/2018 CLINICAL DATA:  Shortness of breath. History of lung cancer. EXAM: PORTABLE CHEST 1 VIEW COMPARISON:  12/07/2018 FINDINGS: Right jugular Port-A-Cath terminates over the mid SVC. The cardiomediastinal silhouette is unchanged with persistent obscuration of the  left heart border. Lung volumes remain low. Diffuse bilateral interstitial and patchy airspace opacities are again seen involving the left greater than right lungs with slight interval  worsening including in the right lower lung. A left pleural effusion persists. No pneumothorax is identified. IMPRESSION: Slight worsening of bilateral airspace disease. Electronically Signed   By: Logan Bores M.D.   On: 12/11/2018 09:13   Dg Chest Port 1 View  Result Date: 12/07/2018 CLINICAL DATA:  Fever and respiratory distress EXAM: PORTABLE CHEST 1 VIEW COMPARISON:  12/03/2018 FINDINGS: Slightly decreased lung volumes noted with stable to slight increase in bilateral interstitial/airspace opacities. LEFT pleural effusion is again noted with LEFT hemithorax volume loss. A RIGHT IJ Port-A-Cath is again identified with tip overlying the mid SVC. No pneumothorax. IMPRESSION: Decreased lung volumes with stable to slight increase in diffuse bilateral interstitial/airspace opacities. No other significant change. Electronically Signed   By: Margarette Canada M.D.   On: 12/07/2018 15:16   Dg Chest Port 1 View  Result Date: 11/29/2018 CLINICAL DATA:  Shortness of breath.  Exposure to COVID-19. EXAM: PORTABLE CHEST 1 VIEW COMPARISON:  Chest x-rays dated 11/20/2018 and 11/07/2018 and chest CTs dated 11/07/2018 and 10/04/2018 FINDINGS: There is a persistent extensive infiltrate in the left lung superimposed on chronic changes including a cavitary lesion in the left midzone laterally. Overall heart size is normal. Aortic atherosclerosis. Power port in good position. Right lung is clear. No acute bone abnormality. IMPRESSION: No change in the appearance of the chest since the prior study of 11/20/2018. Persistent extensive infiltrate on the left superimposed on chronic changes demonstrated on the prior CT scans. Aortic Atherosclerosis (ICD10-I70.0). Electronically Signed   By: Lorriane Shire M.D.   On: 11/29/2018 16:06    Assessment and  plan-  Patient is a 63 y.o. female with history of stage IV squamous cell lung cancer currently admitted due to acute on chronic hypoxic respiratory failure, currently on comfort care/hospice.  Dyspnea is better, oxygen demand gradually trending down. She is comfortable with no acute distress.  Continue current pain regimen, xanax PRN and prednisone. She will go home for hospice when her oxygen demand is acceptable for home hospice. Emotional support was provided.   Earlie Server, MD, PhD Hematology Oncology [facility] Pager- [phone] 12/17/2018

## 2018-12-17 NOTE — Progress Notes (Signed)
[facility]  [facility] continues to follow peripherally to assist with d/c planning home with hospice.  Tamara Parrish is eligible for hospice services.  DME has been ordered and delivered to her daughter Tamara Parrish's house.  [facility] can accommodate 15 lpm O2 at home.  Please call with any questions/concerns,  Venia Carbon RN, BSN, [facility]iaison (in [place] under [facility] and [facility]) [phone] (main #)

## 2018-12-17 NOTE — Progress Notes (Signed)
Patient ID: Tamara Parrish, female   DOB: 1956-01-24, 63 y.o.   MRN: [id number]  [employer] [facility] PROGRESS NOTE  Tamara Parrish GYJ:[id number] DOB: [birth year] DOA: 12/07/2018 PCP: Glendon Axe, MD  HPI/Subjective:  Patient's oxygen requirement little better today she is on 6 L of oxygen   Objective: Vitals:   12/17/18 0745 12/17/18 0848  BP:  124/61  Pulse:  74  Resp:  18  Temp:  98.5 F (36.9 C)  SpO2: 100% 96%    Intake/Output Summary (Last 24 hours) at 12/17/2018 1142 Last data filed at 12/17/2018 1059 Gross per 24 hour  Intake 360 ml  Output 700 ml  Net -340 ml   Filed Weights   12/07/18 1359  Weight: 81.6 kg    ROS: Review of Systems  Constitutional: Positive for malaise/fatigue. Negative for chills and fever.  Eyes: Negative for blurred vision.  Respiratory: Positive for shortness of breath. Negative for cough and wheezing.   Cardiovascular: Negative for chest pain.  Gastrointestinal: Negative for abdominal pain, constipation, diarrhea, nausea and vomiting.  Genitourinary: Negative for dysuria.  Musculoskeletal: Negative for joint pain.  Neurological: Negative for dizziness and headaches.   Exam: Physical Exam  Constitutional: She is oriented to person, place, and time.  HENT:  Nose: No mucosal edema.  Mouth/Throat: No oropharyngeal exudate or posterior oropharyngeal edema.  Ritta Slot has resolved  Eyes: Pupils are equal, round, and reactive to light. Conjunctivae, EOM and lids are normal.  Neck: No JVD present. Carotid bruit is not present. No edema present. No thyroid mass and no thyromegaly present.  Cardiovascular: S1 normal and S2 normal. Exam reveals no gallop.  No murmur heard. Pulses:      Dorsalis pedis pulses are 2+ on the right side and 2+ on the left side.  Respiratory: No respiratory distress. She has decreased breath sounds in the right lower field and the left lower field. She has no wheezes. She has rhonchi in the right lower field  and the left lower field. She has no rales.  GI: Soft. Bowel sounds are normal. There is no abdominal tenderness.  Musculoskeletal:     Right ankle: She exhibits swelling.     Left ankle: She exhibits swelling.  Lymphadenopathy:    She has no cervical adenopathy.  Neurological: She is alert and oriented to person, place, and time. No cranial nerve deficit.  Skin: Skin is warm. Nails show no clubbing.  Redness on the buttock  Psychiatric: She has a normal mood and affect.      Data Reviewed: Basic Metabolic Panel: Recent Labs  Lab 12/14/18 0615  CREATININE 0.59   Liver Function Tests: No results for input(s): AST, ALT, ALKPHOS, BILITOT, PROT, ALBUMIN in the last 168 hours. CBC: No results for input(s): WBC, NEUTROABS, HGB, HCT, MCV, PLT in the last 168 hours. Cardiac Enzymes: No results for input(s): CKTOTAL, CKMB, CKMBINDEX, TROPONINI in the last 168 hours. BNP (last 3 results) Recent Labs    11/20/18 1948 11/29/18 1559  BNP 65.0 142.0*    ProBNP (last 3 results) No results for input(s): PROBNP in the last 8760 hours.  CBG: Recent Labs  Lab 12/16/18 0842 12/16/18 1150 12/16/18 1642 12/16/18 2102 12/17/18 0734  GLUCAP 179* 167* 202* 380* 173*    Recent Results (from the past 240 hour(s))  Blood Culture (routine x 2)     Status: None   Collection Time: 12/07/18  2:14 PM  Result Value Ref Range Status   Specimen Description BLOOD LEFT ANTECUBITAL  Final   Special Requests   Final    BOTTLES DRAWN AEROBIC AND ANAEROBIC Blood Culture adequate volume   Culture   Final    NO GROWTH 5 DAYS Performed at [facility], [address] [place], [place] [address]    Report Status 12/12/2018 FINAL  Final  Blood Culture (routine x 2)     Status: None   Collection Time: 12/07/18  2:14 PM  Result Value Ref Range Status   Specimen Description BLOOD RIGHT ANTECUBITAL  Final   Special Requests   Final    BOTTLES DRAWN AEROBIC AND ANAEROBIC Blood Culture  adequate volume   Culture   Final    NO GROWTH 5 DAYS Performed at [facility], [address], [place], [place] [address]    Report Status 12/12/2018 FINAL  Final  SARS Coronavirus 2 [facility] order, Performed in [place] hospital lab)     Status: None   Collection Time: 12/07/18  2:14 PM  Result Value Ref Range Status   SARS Coronavirus 2 NEGATIVE NEGATIVE Final    Comment: (NOTE) If result is NEGATIVE SARS-CoV-2 target nucleic acids are NOT DETECTED. The SARS-CoV-2 RNA is generally detectable in upper and lower  respiratory specimens during the acute phase of infection. The lowest  concentration of SARS-CoV-2 viral copies this assay can detect is 250  copies / mL. A negative result does not preclude SARS-CoV-2 infection  and should not be used as the sole basis for treatment or other  patient management decisions.  A negative result may occur with  improper specimen collection / handling, submission of specimen other  than nasopharyngeal swab, presence of viral mutation(s) within the  areas targeted by this assay, and inadequate number of viral copies  (<250 copies / mL). A negative result must be combined with clinical  observations, patient history, and epidemiological information. If result is POSITIVE SARS-CoV-2 target nucleic acids are DETECTED. The SARS-CoV-2 RNA is generally detectable in upper and lower  respiratory specimens dur ing the acute phase of infection.  Positive  results are indicative of active infection with SARS-CoV-2.  Clinical  correlation with patient history and other diagnostic information is  necessary to determine patient infection status.  Positive results do  not rule out bacterial infection or co-infection with other viruses. If result is PRESUMPTIVE POSTIVE SARS-CoV-2 nucleic acids MAY BE PRESENT.   A presumptive positive result was obtained on the submitted specimen  and confirmed on repeat testing.  While 2019 novel coronavirus   (SARS-CoV-2) nucleic acids may be present in the submitted sample  additional confirmatory testing may be necessary for epidemiological  and / or clinical management purposes  to differentiate between  SARS-CoV-2 and other Sarbecovirus currently known to infect humans.  If clinically indicated additional testing with an alternate test  methodology [phone]) is advised. The SARS-CoV-2 RNA is generally  detectable in upper and lower respiratory sp ecimens during the acute  phase of infection. The expected result is Negative. Fact Sheet for Patients:  StrictlyIdeas.no Fact Sheet for Healthcare Providers: BankingDealers.co.za This test is not yet approved or cleared by the [place] FDA and has been authorized for detection and/or diagnosis of SARS-CoV-2 by FDA under an Emergency Use Authorization (EUA).  This EUA will remain in effect (meaning this test can be used) for the duration of the COVID-19 declaration under Section 564(b)(1) of the Act, 21 U.S.C. section 360bbb-3(b)(1), unless the authorization is terminated or revoked sooner. Performed at [facility],  [address] [place], [place] [address]   Urine culture     Status: None   Collection Time: 12/08/18  3:15 AM  Result Value Ref Range Status   Specimen Description   Final    URINE, RANDOM Performed at [facility], [address], [place], [place] [address]    Special Requests   Final    Immunocompromised Performed at [facility], [address] [place], [place] [address]    Culture   Final    NO GROWTH Performed at [facility], [place] [address], [place], [place] [address]    Report Status 12/09/2018 FINAL  Final     Studies: No results found.  Scheduled Meds: . ALPRAZolam  0.5 mg Oral Q4H  . amLODipine  5 mg Oral Daily  . aspirin  81 mg Oral Daily  . atorvastatin  40 mg Oral Daily  . budesonide (PULMICORT) nebulizer solution   0.5 mg Nebulization BID  . chlorhexidine  15 mL Mouth Rinse BID  . enoxaparin (LOVENOX) injection  40 mg Subcutaneous Q24H  . hydroxychloroquine  200 mg Oral BID  . insulin aspart  0-9 Units Subcutaneous TID AC & HS  . insulin glargine  50 Units Subcutaneous q morning - 10a  . mouth rinse  15 mL Mouth Rinse q12n4p  . metoprolol tartrate  25 mg Oral BID  . oxyCODONE  20 mg Oral Q12H  . pantoprazole  40 mg Oral Daily  . predniSONE  60 mg Oral Q breakfast  . Ensure Max Protein  11 oz Oral BID BM   Continuous Infusions: . sodium chloride Stopped (12/13/18 1500)    Assessment/Plan:  1. Acute on chronic hypoxic respiratory failure.  Patient started on 6 L of oxygen today see how she does on 6 L, likely discharge home with hospice tomorrow. 2. clinical sepsis.  Status post treatment with antibiotic 3. thrush.  Status post treatment discontinue IV Diflucan 4. Stage IV cancer of the lung.  Any treatments at this point would be palliative in nature.  Dr. Tasia Catchings spoke with daughter Theadora Rama and recommended comfort care and hospice.  No significant improvement family wants patient to go to home with hospice  5. rheumatoid arthritis on Plaquenil 6. Type 2 diabetes on glargine insulin and sliding scale.  Blood sugar labile continue current regimen 7. Hyperkalemia.  Now resolved  8. hyperlipidemia unspecified on Lipitor 9. GERD on Protonix  Code Status:     Code Status Orders  (From admission, onward)         Start     Ordered   12/07/18 2346  Full code  Continuous     12/07/18 2345        Code Status History    Date Active Date Inactive Code Status Order ID Comments User Context   11/29/2018 2250 12/04/2018 1628 Full Code [id number]  Lance Coon, MD Inpatient   11/20/2018 2045 11/22/2018 1806 Full Code [id number]  Loletha Grayer, MD ED   10/26/2018 2037 10/28/2018 2050 Partial Code [id number]  Hillary Bow, MD Inpatient   10/26/2018 1728 10/26/2018 2037 DNR [id number]  Hillary Bow, MD ED    02/21/2018 2004 02/24/2018 1542 Full Code [id number]  Gladstone Lighter, MD ED   02/07/2018 2202 02/11/2018 1738 Full Code [id number]  Dustin Flock, MD Inpatient     Family Communication:  Disposition Plan: The patient will go to [name]'s house upon discharge  Antibiotics:  Cefepime  Diflucan  Time spent: 28 minutes  Enzo Treu Tamara Parrish  [employer]

## 2018-12-18 LAB — GLUCOSE, CAPILLARY
Glucose-Capillary: 88 mg/dL (ref 70–99)
Glucose-Capillary: 208 mg/dL — ABNORMAL HIGH (ref 70–99)
Glucose-Capillary: 304 mg/dL — ABNORMAL HIGH (ref 70–99)

## 2018-12-18 MED ORDER — INSULIN GLARGINE 100 UNIT/ML ~~LOC~~ SOLN: 50.0000 [IU] | Freq: Every morning | SUBCUTANEOUS | 11 refills | Status: AC

## 2018-12-18 MED ORDER — LORAZEPAM 1 MG PO TABS
1.0000 mg | ORAL_TABLET | Freq: Once | ORAL | Status: AC
  Administered 2018-12-18: 15:00:00 1 mg via ORAL
  Filled 2018-12-18: qty 1

## 2018-12-18 MED ORDER — OXYCODONE HCL ER 20 MG PO T12A: 20.0000 mg | EXTENDED_RELEASE_TABLET | Freq: Two times a day (BID) | ORAL | 0 refills | Status: AC

## 2018-12-18 MED ORDER — ALPRAZOLAM 0.5 MG PO TABS: 0.5000 mg | ORAL_TABLET | Freq: Three times a day (TID) | ORAL | 0 refills | Status: AC | PRN

## 2018-12-18 MED ORDER — PREDNISONE 10 MG (21) PO TBPK: ORAL_TABLET | ORAL | 0 refills | Status: AC

## 2018-12-18 NOTE — Progress Notes (Signed)
Pt in no acute distress. VSS. Ativan administered for anxiety in transport. [employer] EMS called to transport the pt. Brandi (Daughter) updated.

## 2018-12-18 NOTE — Progress Notes (Signed)
[facility]) Hospice  Pt will be discharging home today to her daughter [name]'s house.  Spoke with [place], confirmed all DME set up.    [name] requested that someone at the hospital call and update her.  Please send any prescriptions for anxiety/pain home with the pt, so the pt is not without prior to our nurse visiting and establishing services.  [name] felt they were ok tonight, and requested our RN come out in the am.  Please call with any questions/concerns, Venia Carbon RN, BSN, [place] (in [place]) [phone]

## 2018-12-18 NOTE — Progress Notes (Addendum)
Pt is eager to go home, discharge her home today with hospice.  Patient is down to 6 L of oxygen.  Discharge instructions are in the computer.  She has a stage IV squamous cell carcinoma of the lung with acute on chronic respiratory failure.

## 2018-12-18 NOTE — TOC Progression Note (Signed)
Transition of Care [facility]) - Progression Note    Patient Details  Name: Tamara Parrish MRN: [id number] Date of Birth: 03/30/1956  Transition of Care [facility]) CM/SW Contact  Su Hilt, RN Phone Number: 12/18/2018, 11:52 AM  Clinical Narrative:     Called and left a detailed VM for [facility] that the patient is discharging home today thru EMS   Expected Discharge Plan: Home w Hospice Care Barriers to Discharge: Continued Medical Work up  Expected Discharge Plan and Services Expected Discharge Plan: [place] In-house Referral: Hospice / [place] Acute Care Choice: Hospice Living arrangements for the past 2 months: Single Family Home Expected Discharge Date: 12/18/18               DME Arranged: Oxygen, Hospital bed DME Agency: [facility] [place] [facility] [place]: [facility] [place]         Social Determinants of Health (SDOH) Interventions    Readmission Risk Interventions Readmission Risk Prevention Plan 12/08/2018 12/01/2018  Transportation Screening Complete Complete  Medication Review Press photographer) Complete Complete  PCP or Specialist appointment within 3-5 days of discharge Complete Complete  HRI or [place] Complete Complete  SW Recovery Care/Counseling Consult Complete Complete  Palliative Care Screening Complete Not [place] Not Applicable Not Applicable  Some recent data might be hidden

## 2018-12-18 NOTE — Progress Notes (Signed)
Pt becomes tachypneic when turned for EMS set up. Pt has these anxious "episodes" when head is laid down for any repositioning. EMS at bedside. Pt feels she has to sit up on edge of bed for awhile to calm down. MD notified. One time dose of PO ativan ordered. Pt allowed to sit up on edge of bed to catch her breath and calm down. RN notified EMS that they would be called to transport pt after she was cleaned up, repositioned, and medicated.

## 2018-12-22 NOTE — Discharge Summary (Signed)
Tamara Parrish, is a 63 y.o. female  DOB 10-01-1955  MRN [id number].  Admission date:  12/07/2018  Admitting Physician  Lance Coon, MD  Discharge Date:  12/18/2018   Primary MD  Glendon Axe, MD  Recommendations for primary care physician for things to follow:  Discharge home, follow-up with PCP in 1 week   Admission Diagnosis  Respiratory distress [R06.03] HCAP (healthcare-associated pneumonia) [J18.9]   Discharge Diagnosis  Respiratory distress [R06.03] HCAP (healthcare-associated pneumonia) [J18.9]    Principal Problem:   Sepsis ([place]) Active Problems:   Malignant neoplasm of lung ([place])   Diabetes mellitus type 2, uncomplicated ([place])   Essential hypertension   Pure hypercholesterolemia   Rheumatoid arthritis ([place])   HCAP (healthcare-associated pneumonia)   Cancer of upper lobe of left lung ([place])   Pressure injury of skin   Respiratory distress   SOB (shortness of breath)      Past Medical History:  Diagnosis Date  . Cancer ([place])    lung ca DX 2019  . Collagen vascular disease (H[facility])    RA  . DM2 (diabetes mellitus, type 2) ([place])   . Dyspnea   . Hyperlipemia   . Hypertension   . Osteomyelitis ([place])   . Pneumonia   . RA (rheumatoid arthritis) ([place])   . Rheumatoid arthritis [facility])     Past Surgical History:  Procedure Laterality Date  . ENDOBRONCHIAL ULTRASOUND N/A 05/13/2018   Procedure: ENDOBRONCHIAL ULTRASOUND;  Surgeon: [name], MD;  Location: [facility] ORS;  Service: Pulmonary;  Laterality: N/A;  . PORTA CATH INSERTION N/A 05/27/2018   Procedure: PORTA CATH INSERTION;  Surgeon: Katha Cabal, MD;  Location: [place] [facility];  Service: Cardiovascular;  Laterality: N/A;  . TOE SURGERY         History of present illness and  Hospital Course:     Kindly see H&P for  history of present illness and admission details, please review complete Labs, Consult reports and Test reports for all details in brief  HPI  from the history and physical done on the day of admission 63 year old female came in because of respiratory distress, recurrent pneumonia.   Hospital Course  #1 sepsis present on admission secondary to healthcare, received IV antibiotics, patient has stage IV, cell lung cancer, seen by Dr. Tasia Catchings, also seen by palliative care, hospice, initially patient required higher amount of oxygen then decreased to 5 L, patient is discharged home with hospice. #2, rheumatoid arthritis: On Plaquenil 3.  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 Tasia Catchings, patient's daughter discussed with Dr. Tasia Catchings, patient daughter name is Velna Hatchet, Dr. you admitted for care and hospice.  Patient just discharged home with hospice to [facility]'s house. 4.  Diabetes mellitus type 2: 5.  Hyperkalemia: Resolved 6.  Hyperlipidemia: On statins as tolerated as per her p.o. intake.    Discharge Condition: Stable   Follow UP      Discharge Instructions  and  Discharge Medications      Allergies as of 12/18/2018      Reactions   Ibuprofen Other (See Comments)   Is not supposed to take because of her kidneys.   Penicillins Other (See Comments)   "welts" as a child (unknown age) Has patient had a PCN reaction causing immediate rash, facial/tongue/throat swelling, SOB or lightheadedness with hypotension: No Has patient had a PCN reaction causing severe rash involving mucus membranes or skin necrosis: No Has patient had a PCN reaction that  required hospitalization: No Has patient had a PCN reaction occurring within the last 10 years: No If all of the above answers are "NO", then may proceed with Cephalosporin use.      Medication List    STOP taking these medications   Basaglar KwikPen 100 UNIT/ML Sopn Replaced by:  insulin glargine 100 UNIT/ML injection    dexamethasone 4 MG tablet Commonly known as:  DECADRON   levofloxacin 750 MG tablet Commonly known as:  Levaquin     TAKE these medications   albuterol 108 (90 Base) MCG/ACT inhaler Commonly known as:  VENTOLIN HFA Inhale 2 puffs into the lungs every 6 (six) hours as needed for wheezing or shortness of breath.   albuterol (2.5 MG/3ML) 0.083% nebulizer solution Commonly known as:  PROVENTIL Take 3 mLs (2.5 mg total) by nebulization every 6 (six) hours as needed for wheezing or shortness of breath.   ALPRAZolam 0.5 MG tablet Commonly known as:  XANAX Take 1 tablet (0.5 mg total) by mouth 3 (three) times daily as needed for anxiety.   amLODipine 5 MG tablet Commonly known as:  NORVASC Take 5 mg by mouth daily.   aspirin 81 MG chewable tablet Chew 1 tablet by mouth daily.   atorvastatin 40 MG tablet Commonly known as:  LIPITOR Take 40 mg by mouth daily.   feeding supplement (ENSURE ENLIVE) Liqd Take 237 mLs by mouth 2 (two) times daily between meals.   ferrous sulfate 325 (65 FE) MG EC tablet Take 1 tablet (325 mg total) by mouth 2 (two) times daily with a meal.   folic acid 1 MG tablet Commonly known as:  FOLVITE Take 1 mg by mouth daily.   hydroxychloroquine 200 MG tablet Commonly known as:  PLAQUENIL Take 200 mg by mouth 2 (two) times daily.   insulin glargine 100 UNIT/ML injection Commonly known as:  LANTUS Inject 0.5 mLs (50 Units total) into the skin every morning. Replaces:  Basaglar KwikPen 100 UNIT/ML Sopn   lisinopril 40 MG tablet Commonly known as:  ZESTRIL Take 40 mg by mouth daily.   megestrol 40 MG tablet Commonly known as:  MEGACE Take 1 tablet (40 mg total) by mouth 3 (three) times daily.   metoprolol tartrate 25 MG tablet Commonly known as:  LOPRESSOR Take 25 mg by mouth 2 (two) times daily.   mirtazapine 7.5 MG tablet Commonly known as:  REMERON Take 1 tablet (7.5 mg total) by mouth at bedtime.   Narcan 4 MG/0.1ML Liqd nasal spray  kit Generic drug:  naloxone Place 1 spray into the nose once.   nystatin 100000 UNIT/ML suspension Commonly known as:  MYCOSTATIN Take 5 mLs (500,000 Units total) by mouth 4 (four) times daily.   omeprazole 20 MG capsule Commonly known as:  PRILOSEC Take 1 capsule (20 mg total) by mouth daily.   ondansetron 8 MG tablet Commonly known as:  Zofran Take 1 tablet (8 mg total) by mouth 2 (two) times daily as needed for refractory nausea / vomiting. Start on day 3 after chemo.   oxyCODONE 20 mg 12 hr tablet Commonly known as:  OXYCONTIN Take 1 tablet (20 mg total) by mouth every 12 (twelve) hours. What changed:  Another medication with the same name was removed. Continue taking this medication, and follow the directions you see here.   predniSONE 10 MG (21) Tbpk tablet Commonly known as:  STERAPRED UNI-PAK 21 TAB Taper by 10 mg p.o. daily   prochlorperazine 10 MG tablet Commonly known as:  COMPAZINE Take 1 tablet (10 mg total) by mouth every 6 (six) hours as needed (Nausea or vomiting).   sucralfate 1 g tablet Commonly known as:  Carafate Take 1 tablet (1 g total) by mouth 3 (three) times daily for 5 days. Dissolve in 3-4 tbsp warm water, swish and swallow   Symbicort 160-4.5 MCG/ACT inhaler Generic drug:  budesonide-formoterol INHALE 2 PUFFS BY MOUTH 2X A DAY What changed:  See the new instructions.   triamterene-hydrochlorothiazide 37.5-25 MG capsule Commonly known as:  DYAZIDE Take 1 capsule by mouth daily.         Diet and Activity recommendation: See Discharge Instructions above   Consults obtained -oncology, palliative care, case management   Major procedures and Radiology Reports - PLEASE review detailed and final reports for all details, in brief -     Dg Chest 1 View  Result Date: 12/03/2018 CLINICAL DATA:  Fever. History of lung cancer in 2019. History of pneumonia, diabetes, former smoker. EXAM: CHEST  1 VIEW COMPARISON:  Chest x-rays dated 11/29/2018  and 10/26/2018. Chest CT dated 11/29/2018. FINDINGS: Heart size and mediastinal contours appear stable. RIGHT chest wall Port-A-Cath appears stable in position with tip at the level of the mid SVC. Persistent near complete opacification of the LEFT hemithorax, with small residual aeration at the LEFT lung apex. Increased interstitial markings within the RIGHT lung suggesting developing edema. No pneumothorax seen. Osseous structures about the chest are unremarkable. IMPRESSION: Stable chest x-ray. Persistent near complete opacification of the LEFT hemithorax, compatible with the combination of cavitary LEFT upper lobe pulmonary lesion (known lung cancer) and the progressive interstitial and ill-defined airspace disease in the LEFT lower lobe better demonstrated on chest CT of 11/29/2018 with differential of metastatic progression versus superimposed pneumonia. Electronically Signed   By: Franki Cabot M.D.   On: 12/03/2018 13:55   Ct Chest W Contrast  Result Date: 11/29/2018 CLINICAL DATA:  Shortness of breath and increasing oxygen requirements. EXAM: CT CHEST WITH CONTRAST TECHNIQUE: Multidetector CT imaging of the chest was performed during intravenous contrast administration. CONTRAST:  83m OMNIPAQUE IOHEXOL 300 MG/ML  SOLN COMPARISON:  11/07/2018 FINDINGS: Cardiovascular: The heart size is normal. No substantial pericardial effusion. Coronary artery calcification is evident. Atherosclerotic calcification is noted in the wall of the thoracic aorta. Right Port-A-Cath tip is positioned in the mid SVC. Main pulmonary arteries are enlarged. Mediastinum/Nodes: 8 mm short axis subcarinal lymph node evident. Precarinal lymph node measures up to 11 mm short axis. Multiple small nodes are seen in the prevascular space. There is no axillary lymphadenopathy. Lungs/Pleura: The central tracheobronchial airways are patent. Centrilobular and paraseptal emphysema evident. volume loss left hemithorax is similar to prior.  Continued progression of interstitial and airspace disease in the left upper lobe surrounding the cavitary mass in this patient with known lung cancer.Interval progression of interstitial and irregular, ill-defined airspace disease in the left base with peripheral predominance. Areas of subpleural reticulation in the right lung are similar to prior. Upper Abdomen: Unremarkable Musculoskeletal: No worrisome lytic or sclerotic osseous abnormality. Old left rib fracture evident. IMPRESSION: 1. Volume loss left hemithorax with similar appearance of cavitary left upper lobe pulmonary lesion in this patient with known lung cancer. Since 11/07/2018, there is been progression of interstitial and ill-defined airspace disease in the left lower lobe which may represent metastatic progression or infectious etiology. 2. Borderline to mild mediastinal lymphadenopathy. Metastatic disease a concern. 3. Enlargement of the main pulmonary arteries compatible with pulmonary arterial hypertension. 4.  Aortic  Atherosclerois (ICD10-170.0) 5.  Emphysema. (DPO24-M35.9) Electronically Signed   By: Misty Stanley M.D.   On: 11/29/2018 18:55   Dg Chest Port 1 View  Result Date: 12/11/2018 CLINICAL DATA:  Shortness of breath. History of lung cancer. EXAM: PORTABLE CHEST 1 VIEW COMPARISON:  12/07/2018 FINDINGS: Right jugular Port-A-Cath terminates over the mid SVC. The cardiomediastinal silhouette is unchanged with persistent obscuration of the left heart border. Lung volumes remain low. Diffuse bilateral interstitial and patchy airspace opacities are again seen involving the left greater than right lungs with slight interval worsening including in the right lower lung. A left pleural effusion persists. No pneumothorax is identified. IMPRESSION: Slight worsening of bilateral airspace disease. Electronically Signed   By: Logan Bores M.D.   On: 12/11/2018 09:13   Dg Chest Port 1 View  Result Date: 12/07/2018 CLINICAL DATA:  Fever and  respiratory distress EXAM: PORTABLE CHEST 1 VIEW COMPARISON:  12/03/2018 FINDINGS: Slightly decreased lung volumes noted with stable to slight increase in bilateral interstitial/airspace opacities. LEFT pleural effusion is again noted with LEFT hemithorax volume loss. A RIGHT IJ Port-A-Cath is again identified with tip overlying the mid SVC. No pneumothorax. IMPRESSION: Decreased lung volumes with stable to slight increase in diffuse bilateral interstitial/airspace opacities. No other significant change. Electronically Signed   By: Margarette Canada M.D.   On: 12/07/2018 15:16   Dg Chest Port 1 View  Result Date: 11/29/2018 CLINICAL DATA:  Shortness of breath.  Exposure to COVID-19. EXAM: PORTABLE CHEST 1 VIEW COMPARISON:  Chest x-rays dated 11/20/2018 and 11/07/2018 and chest CTs dated 11/07/2018 and 10/04/2018 FINDINGS: There is a persistent extensive infiltrate in the left lung superimposed on chronic changes including a cavitary lesion in the left midzone laterally. Overall heart size is normal. Aortic atherosclerosis. Power port in good position. Right lung is clear. No acute bone abnormality. IMPRESSION: No change in the appearance of the chest since the prior study of 11/20/2018. Persistent extensive infiltrate on the left superimposed on chronic changes demonstrated on the prior CT scans. Aortic Atherosclerosis (ICD10-I70.0). Electronically Signed   By: Lorriane Shire M.D.   On: 11/29/2018 16:06    Micro Results    No results found for this or any previous visit (from the past 240 hour(s)).     Today   Subjective:   Tamara Parrish today has shortness of breath but stable for discharge home with hospice.  Objective:   Blood pressure 125/65, pulse 87, temperature 98.3 F (36.8 C), temperature source Oral, resp. rate 20, height '5\' 6"'$  (1.676 m), weight 81.6 kg, SpO2 98 %.  No intake or output data in the 24 hours ending 12/22/18 1410  Exam Awake Alert, Oriented x 3, No new F.N deficits,  Normal affect [place].AT,PERRAL Supple Neck,No JVD, No cervical lymphadenopathy appriciated.  Symmetrical Chest wall movement, Good air movement bilaterally, CTAB Diminished air entry bilaterally. +ve B.Sounds, Abd Soft, Non tender, No organomegaly appriciated, No rebound -guarding or rigidity. No Cyanosis, Clubbing or edema, No new Rash or bruise  Data Review   CBC w Diff:  Lab Results  Component Value Date   WBC 18.3 (H) 12/09/2018   HGB 8.0 (L) 12/09/2018   HGB 11.2 (L) 02/12/2014   HCT 27.3 (L) 12/09/2018   HCT 33.7 (L) 02/12/2014   PLT 386 12/09/2018   PLT 404 02/12/2014   LYMPHOPCT 3 12/07/2018   LYMPHOPCT 28.6 02/12/2014   MONOPCT 8 12/07/2018   MONOPCT 11.9 02/12/2014   EOSPCT 0 12/07/2018   EOSPCT 2.3 02/12/2014  BASOPCT 0 12/07/2018   BASOPCT 0.6 02/12/2014    CMP:  Lab Results  Component Value Date   NA 136 12/10/2018   NA 133 (L) 02/09/2014   K 4.8 12/10/2018   K 4.2 02/09/2014   CL 108 12/10/2018   CL 102 02/09/2014   CO2 23 12/10/2018   CO2 26 02/09/2014   BUN 46 (H) 12/10/2018   BUN 11 02/09/2014   CREATININE 0.59 12/14/2018   CREATININE 0.78 02/09/2014   PROT 7.9 12/08/2018   PROT 7.6 02/08/2014   ALBUMIN 2.1 (L) 12/08/2018   ALBUMIN 2.9 (L) 02/08/2014   BILITOT 0.5 12/08/2018   BILITOT 0.3 02/08/2014   ALKPHOS 83 12/08/2018   ALKPHOS 113 02/08/2014   AST 30 12/08/2018   AST 16 02/08/2014   ALT 16 12/08/2018   ALT 12 02/08/2014  .   Total Time in preparing paper work, data evaluation and todays exam - 35 minutes  Epifanio Lesches M.D on 12/18/2018 at 2:10 PM    Note: This dictation was prepared with Dragon dictation along with smaller phrase technology. Any transcriptional errors that result from this process are unintentional.

## 2018-12-29 ENCOUNTER — Other Ambulatory Visit: Payer: Self-pay | Admitting: Internal Medicine

## 2019-01-19 ENCOUNTER — Encounter: Payer: Self-pay | Admitting: Oncology

## 2019-01-23 DEATH — deceased

## 2019-02-02 ENCOUNTER — Ambulatory Visit: Payer: PRIVATE HEALTH INSURANCE | Admitting: Radiation Oncology

## 2019-04-13 LAB — ECHOCARDIOGRAM COMPLETE
Height: 66 in
Weight: 2880 oz

## 2020-03-09 IMAGING — PT NM PET TUM IMG INITIAL (PI) SKULL BASE T - THIGH
1 of 9 series · 1 of 25 positions shown · non-contrast
Comparison: Multiple exams, including 05/05/2018

ADDENDUM:
The original report was by Dr. Kviria Bitar. The following
addendum is by Dr. Kviria Bitar:

Please note that in correction to impression # 5, the separate
pleural nodule on the left side does indicate M1a disease by TNM 8th
edition, and hence the patient is stage BISWA whether or not the
sacrococcygeal lesion turns out positive.
CLINICAL DATA: Initial treatment strategy for non-small cell lung
cancer of the left upper lobe.
EXAM:
NUCLEAR MEDICINE PET SKULL BASE TO THIGH
TECHNIQUE: 9.6 mCi F-18 FDG was injected intravenously. Full-ring PET imaging
was performed from the skull base to thigh after the radiotracer. CT
data was obtained and used for attenuation correction and anatomic
localization.
Fasting blood glucose: 122 mg/dl

[Series 3: ct wb 5.0 b30f · axial · 5.0mm · 0.98mm/px · 1 of 290 slices shown]
[im 290/290  brain]
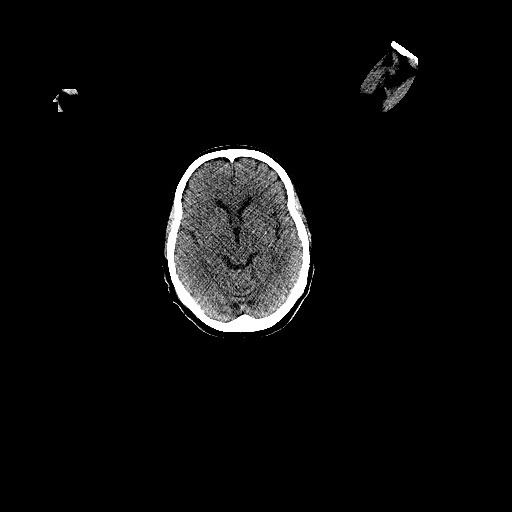

[1 of 25 positions shown; findings below may reference images not displayed]

FINDINGS: Mediastinal blood pool activity: SUV max

NECK: Symmetric glottic activity is thought to be physiologic. No CT
abnormality in this vicinity.

Incidental CT findings: Unerupted molar type left maxillary tooth
along the posterior margin of the left maxillary sinus.

CHEST: The apical left upper lobe mass is highly hypermetabolic,
maximum SUV 16.9, and is associated with adjacent left third rib
destruction and invasion of the chest wall at the left third rib and
in the third intercostal space. The posterior margin of the mass
also causes some erosion/destruction of the left fourth rib
posteriorly on image 77/3. The mass measures up to 10.5 cm
anterior-posterior.

A separate left pleural tumor deposit is present along the left
lateral extraforaminal space at the T3-4 level in between the left
third and fourth medial ribs, measuring 1.1 by 1.6 cm on image 70/3
and with maximum SUV 4.1.

An AP window lymph node measures 1.4 cm in short axis on image 76/3
and has maximum SUV of 7.0. Adjacent mildly hypermetabolic AP window
adenopathy is also noted

Small paratracheal lymph nodes are present and include a 1.0 cm
right lower paratracheal node with maximum SUV 3.2.

The left upper lobe mass extends down into the left hilum, and
accordingly is likely confluent with hilar adenopathy which is
presumably obscured.

Multiple pulmonary nodules in the left upper lobe are likely
satellite nodules. One lingular satellite nodule measures 0.8 by
cm on image 90/3 (previously 0.4 by 0.3 cm on 05/05/2018) with
maximum SUV 4.8. There may be some invasion of the mass across the
left major fissure into the lateral portion of the left upper lobe
for example on image 80/3.

A pleural-based nodule anteriorly in the right middle lobe measures
0.5 by 0.4 cm on image 102/3, maximum SUV 0.9. Technically
nonspecific due to small size, merit surveillance.

Incidental CT findings: Atherosclerotic calcification of the
thoracic aorta. Centrilobular emphysema. Scattered peripheral
ground-glass density in the lungs similar to prior.

ABDOMEN/PELVIS: Multifocal activity in the bowel is likely
physiologic and does not have a definite CT correlate. This includes
activity at the anus which has maximum SUV of 9.8.

Incidental CT findings: Bilateral perirenal stranding without
hydronephrosis. Left kidney lower pole photopenic cyst. Aortoiliac
atherosclerotic vascular disease. Sigmoid colon diverticulosis.
Adrenal glands unremarkable.

SKELETON: Direct tumor involvement of the left third and fourth ribs
as noted above. Accentuated activity at the sacrococcygeal junction,
maximum SUV 4.4, without a well-defined bony destructive lesion.
Subtle presacral edema in this vicinity.

Incidental CT findings: none
IMPRESSION: 1. Highly hypermetabolic left upper lobe mass, maximum SUV 16.9,
with chest wall invasion of the left third and fourth ribs and into
the third intercostal space.
2. Separate pleural tumor deposit on the left at the T3-4 level
medially near the neural foramen.
3. Hypermetabolic AP window lymph nodes. Probable left hilar lymph
nodes confluent with the mass. Separate tumor nodules are noted in
the left upper lobe.
4. Hypermetabolic sacrococcygeal junction without a well-defined
lesion, probably from a fracture, less likely from early metastatic
disease.
5. By eighth edition TMN criteria, and assuming that the
sacrococcygeal activity is benign, this represents T4 (size greater
than 7 cm) N2 M0 disease (stage IIIb). If the sacrococcygeal lesion
turns out to be malignant, then this would represent M1b disease
(stage BISWA). Bony pelvic protocol MRI with and without contrast
could be utilized to further assess the sacrococcygeal lesion.
6. Other imaging findings of potential clinical significance: Aortic
Atherosclerosis (LDGK8-XT6.6) and Emphysema (LDGK8-U6V.E). Sigmoid
colon diverticulosis.
